# Patient Record
Sex: Male | Born: 1935
Health system: Southern US, Community
[De-identification: ages and names within clinical notes are randomized; demographics above are authoritative.]

## PROBLEM LIST (undated history)

## (undated) DIAGNOSIS — Z8701 Personal history of pneumonia (recurrent): Secondary | ICD-10-CM

## (undated) DIAGNOSIS — I313 Pericardial effusion (noninflammatory): Secondary | ICD-10-CM

## (undated) DIAGNOSIS — G5603 Carpal tunnel syndrome, bilateral upper limbs: Secondary | ICD-10-CM

## (undated) DIAGNOSIS — E785 Hyperlipidemia, unspecified: Secondary | ICD-10-CM

## (undated) DIAGNOSIS — I10 Essential (primary) hypertension: Secondary | ICD-10-CM

## (undated) DIAGNOSIS — M329 Systemic lupus erythematosus, unspecified: Secondary | ICD-10-CM

## (undated) DIAGNOSIS — K2270 Barrett's esophagus without dysplasia: Secondary | ICD-10-CM

## (undated) DIAGNOSIS — K219 Gastro-esophageal reflux disease without esophagitis: Secondary | ICD-10-CM

## (undated) DIAGNOSIS — I714 Abdominal aortic aneurysm, without rupture, unspecified: Secondary | ICD-10-CM

## (undated) DIAGNOSIS — N200 Calculus of kidney: Secondary | ICD-10-CM

## (undated) DIAGNOSIS — G4730 Sleep apnea, unspecified: Secondary | ICD-10-CM

## (undated) DIAGNOSIS — M1990 Unspecified osteoarthritis, unspecified site: Secondary | ICD-10-CM

## (undated) DIAGNOSIS — M797 Fibromyalgia: Secondary | ICD-10-CM

## (undated) DIAGNOSIS — I3139 Other pericardial effusion (noninflammatory): Secondary | ICD-10-CM

## (undated) DIAGNOSIS — D649 Anemia, unspecified: Secondary | ICD-10-CM

## (undated) HISTORY — DX: Gastro-esophageal reflux disease without esophagitis: K21.9

## (undated) HISTORY — DX: Abdominal aortic aneurysm, without rupture: I71.4

## (undated) HISTORY — PX: HAND SURGERY: SHX662

## (undated) HISTORY — PX: APPENDECTOMY: SHX54

## (undated) HISTORY — DX: Personal history of pneumonia (recurrent): Z87.01

## (undated) HISTORY — PX: TONSILLECTOMY: SUR1361

## (undated) HISTORY — PX: KNEE ARTHROPLASTY: SHX992

## (undated) HISTORY — PX: CYSTOSCOPY W/ STONE MANIPULATION: SHX1427

## (undated) HISTORY — DX: Hyperlipidemia, unspecified: E78.5

## (undated) HISTORY — PX: KNEE ARTHROSCOPY: SUR90

## (undated) HISTORY — DX: Pericardial effusion (noninflammatory): I31.3

## (undated) HISTORY — DX: Fibromyalgia: M79.7

## (undated) HISTORY — DX: Abdominal aortic aneurysm, without rupture, unspecified: I71.40

## (undated) HISTORY — DX: Other pericardial effusion (noninflammatory): I31.39

## (undated) HISTORY — DX: Calculus of kidney: N20.0

## (undated) HISTORY — DX: Essential (primary) hypertension: I10

---

## 1964-02-28 HISTORY — PX: HERNIA REPAIR: SHX51

## 1999-10-22 ENCOUNTER — Emergency Department (HOSPITAL_COMMUNITY): Admission: EM | Admit: 1999-10-22 | Discharge: 1999-10-22 | Payer: Self-pay

## 1999-10-24 ENCOUNTER — Ambulatory Visit (HOSPITAL_COMMUNITY): Admission: AD | Admit: 1999-10-24 | Discharge: 1999-10-24 | Payer: Self-pay | Admitting: Urology

## 1999-10-24 ENCOUNTER — Encounter: Payer: Self-pay | Admitting: Urology

## 2003-01-13 ENCOUNTER — Ambulatory Visit (HOSPITAL_COMMUNITY): Admission: RE | Admit: 2003-01-13 | Discharge: 2003-01-13 | Payer: Self-pay | Admitting: *Deleted

## 2003-01-13 ENCOUNTER — Encounter (INDEPENDENT_AMBULATORY_CARE_PROVIDER_SITE_OTHER): Payer: Self-pay

## 2008-01-29 ENCOUNTER — Ambulatory Visit (HOSPITAL_COMMUNITY): Admission: RE | Admit: 2008-01-29 | Discharge: 2008-01-29 | Payer: Self-pay | Admitting: Urology

## 2008-09-10 ENCOUNTER — Encounter: Admission: RE | Admit: 2008-09-10 | Discharge: 2008-09-10 | Payer: Self-pay | Admitting: Family Medicine

## 2008-09-11 ENCOUNTER — Ambulatory Visit: Payer: Self-pay | Admitting: Vascular Surgery

## 2009-12-07 ENCOUNTER — Ambulatory Visit: Payer: Self-pay | Admitting: Vascular Surgery

## 2010-03-20 ENCOUNTER — Encounter: Payer: Self-pay | Admitting: Family Medicine

## 2010-07-12 NOTE — Procedures (Signed)
VASCULAR LAB EXAM   INDICATION:  Rule out popliteal aneurysms   HISTORY:  Diabetes:  No.  Cardiac:  No.  Hypertension:  No.   EXAM:  Bilateral popliteal artery duplex.   IMPRESSION:  1. No evidence of aneurysmal dilatation of the bilateral popliteal      arteries with maximum diameters of 0.98 cm on the right and 1.0 cm      on the left.  2. Triphasic Doppler waveforms noted throughout the bilateral      popliteal arteries.        ___________________________________________  Larina Earthly, M.D.   CH/MEDQ  D:  12/08/2009  T:  12/08/2009  Job:  [id number]

## 2010-07-12 NOTE — Op Note (Signed)
Tyler Norris, Tyler Norris             ACCOUNT NO.:  [id number]   MEDICAL RECORD NO.:  [id number]          PATIENT TYPE:  AMB   LOCATION:  DAY                          FACILITY:  [facility]   PHYSICIAN:  Mark C. Vernie Ammons, M.D.  DATE OF BIRTH:  June 01, 1934   DATE OF PROCEDURE:  01/29/2008  DATE OF DISCHARGE:                               OPERATIVE REPORT   PREOPERATIVE DIAGNOSIS:  Left ureteral calculous.   POSTOPERATIVE DIAGNOSIS:  Left ureteral calculous.   PROCEDURES:  1. Cystoscopy.  2. Left ureteral catheterization.  3. Left retrograde pyelogram with interpretation.  4. Left ureteroscopy with laser lithotripsy.  5. Ureteroscopic stone extraction.  6. Left double J stent placement.   SURGEON:  Mark C. Vernie Ammons, M.D.   ASSISTANT:  Dr. Allena Katz.   ANESTHESIA:  General.   BLOOD LOSS:  None.   SPECIMENS:  Stone to the patient.   DRAINS:  6 French 24 double J stent in the left ureter (with string).   COMPLICATIONS:  None.   INDICATIONS FOR PROCEDURE:  The patient is a 76 year old white male with  intermittent left renal colic secondary to left ureteral calculous that  failed to progress with over time and conservative medical expulsive  therapy.  We have discussed the risks, complications and alternatives to  surgery.  He understands and has elected to proceed with the above noted  procedure.   DESCRIPTION OF PROCEDURE:  After informed consent the patient was  brought to the major OR and placed on the table and administered general  anesthesia then moved to the dorsal lithotomy position.  His genitalia  were sterilely prepped and draped.  An official time-out was then  performed.   Initially a 22 French cystoscope with 12 degree lens was advanced per  urethra under direct visualization and the urethra was noted to be  entirely normal down into the sphincter which appears intact.  The  prostatic urethra was slightly elongated with bilobar hypertrophy but no  prostatic lesions were  seen.  Upon entering the bladder the bladder was  then fully and systematically inspected and noted to be free of any  tumors, stones or inflammatory lesions.  The ureteral orifices were  normal configuration and position.  The left ureteral orifice was then  identified and cannulated with a 6 French open ended ureteral catheter.  Through this open ended ureteral catheter a left retrograde pyelogram  was then performed by injecting full strength contrast.   Left retrograde pyelogram revealed a normal appearing ureter with a  filling defect just over the lower border of the sacrum.  Proximal to  that the ureter appeared normal with no filling defects or other  abnormalities.   A 0.038 inch floppy tipped guidewire was then passed through the open  ended catheter and up the left ureter under direct fluoroscopic control  into the area of the renal pelvis and maintained as a safety guidewire.  Next the 6 French rigid ureteroscope was then passed under direct  visualization through the urethra and up the left ureter without  difficulty to where the stone was identified and photographed.  The  stone was too large to be extracted and therefore Holmium laser  lithotripsy was therefore performed.  Using a 365 micron Holmium laser  fiber the stone was fragmented easily into smaller pieces.  The Nitinol  basket was then used to extract these individually and at the end of the  procedure no significant stone fragments remained.  The ureteroscope was  therefore removed.   The cystoscope was back loaded over the guidewire and a double J stent  was then passed over the guidewire into the area of the renal pelvis  under direct fluoroscopic control.  As the guidewire was removed good  curl was noted in the renal pelvis and in the bladder.  The bladder was  then drained and the cystoscope removed.  The string, which was affixed  to the distal aspect of the stent, was then affixed to the dorsum of the   penis and the patient was awakened and taken to the recovery room in  stable satisfactory condition.  He tolerated the procedure well with no  intraoperative complications.  He will be given a prescription for  Pyridium 200 mg and follow up with me in the office in 5-7 days.      Mark C. Vernie Ammons, M.D.  Electronically Signed     MCO/MEDQ  D:  01/29/2008  T:  01/29/2008  Job:  [id number]

## 2010-07-12 NOTE — Consult Note (Signed)
NEW PATIENT CONSULTATION   Tyler Norris, Tyler Norris  DOB:  [birth year]                                       09/11/2008  NUUVO#:[id number]   The patient presents today for discussion regarding recent incidental  finding of a small infrarenal abdominal aortic aneurysm.  He is a very  active healthy 76 year old gentleman who reports over the course of the  last week to 10 days having a sensation of upper abdominal pain and  extending around to his back.  He reports that this initially seemed to  be somewhat related to musculoskeletal issues since if he would take a  deep breath he would have some discomfort.  He does have a history of  hiatal hernia and reflux.  He also has history of kidney stones but  reports this is not similar to the kidney stone pain that he had in the  past.  He underwent ultrasound and this revealed a 3.5 cm diameter  infrarenal aneurysm.   PAST MEDICAL HISTORY:  Negative for cardiac disease.  He does have  elevated cholesterol.   SOCIAL HISTORY:  He is married.  He is retired.  He does not smoke,  drink alcohol.   REVIEW OF SYSTEMS:  Weight is reported at 214 pounds.  He is 6 feet  tall.  He has no cardiac or pulmonary dysfunction.  He does have a  history of esophageal reflux and kidney stones.  No neurologic or  orthopedic issues.   ALLERGIES:  Lipitor causes myalgias.   CURRENT MEDICATIONS:  Nexium, TriCor, Crestor and multivitamins.   PHYSICAL EXAM:  A well-developed, well-nourished white male appearing  stated age of 79.  Blood pressure is 137/82, pulse 65, respirations 18,  radial pulses are 2+.  He has 2+ femoral, 2+ to 3+ popliteal and 2+  posterior tibial pulses.  He does have some prominence of pulsation in  his left popliteal space but no true aneurysm by physical exam.  Abdominal Exam:  Reveals obesity and a slight diastasis rectus.  I do  not feel the aneurysm, he does not have any tenderness.  His carotid  arteries are  without bruits bilaterally.  Heart:  Regular rate and  rhythm.  Chest:  Clear bilaterally.   I reviewed his ultrasound with the patient.  I explained that his 3.5 cm  infrarenal aneurysm is of no risk currently.  I explained that the  normal size would be 2 cm and we would not consider fixing this until it  got around 5 cm to 5.5 cm.  I explained that he does not need to do any  limitation regarding activity or other exercises or positioning.  I  explained that there would be expected slow growth of the aneurysm over  time and, therefore, we would follow him with serial ultrasounds and  plan to see him again in 1 year with repeat ultrasound at that time.  He  asked regarding FAA flying regulations and this aneurysm.  I explained  that he should be at essentially zero risk of rupture or other problems  with his very small aneurysm currently.  We will see him again in 1 year  with a repeat ultrasound.   Tyler Norris, M.D.  Electronically Signed   TFE/MEDQ  D:  09/11/2008  T:  09/14/2008  Job:  [id number]   cc:  Chales Salmon. Abigail Miyamoto, M.D.  Charles P. Harle Stanford, Dr.

## 2010-07-12 NOTE — Procedures (Signed)
DUPLEX ULTRASOUND OF ABDOMINAL AORTA   INDICATION:  Follow up abdominal aortic aneurysm, seen during abdominal  ultrasound done at outside facility.   HISTORY:  Diabetes:  No.  Cardiac:  No.  Hypertension:  No.  Smoking:  No.  Connective Tissue Disorder:  Family History:  No.  Previous Surgery:  No.   DUPLEX EXAM:         AP (cm)                   TRANSVERSE (cm)  Proximal             2.8 cm                    2.99 cm  Mid                  2.3 cm                    2.5 cm  Distal               2.0 cm                    2.0 cm  Right Iliac          1.4 cm                    1.4 cm  Left Iliac           1.3 cm                    1.4 cm   PREVIOUS:  Date:  AP:  TRANSVERSE:   IMPRESSION:  1. No obvious evidence of aneurysmal dilatation of the abdominal aorta      noted, based on limited visualization.  2. Decreased visualization of the abdominal aorta due to overlying      bowel gas patterns.   ___________________________________________  Larina Earthly, M.D.   CH/MEDQ  D:  12/08/2009  T:  12/08/2009  Job:  [id number]

## 2010-07-15 NOTE — Op Note (Signed)
[facility]  Patient:    Tyler Norris, Tyler Norris                    MRN: [id number] Proc. Date: 10/24/99 Adm. Date:  [id number] Attending:  Liborio Nixon                           Operative Report  PREOPERATIVE DIAGNOSIS:  Right distal ureteral calculus with significant pain.  POSTOPERATIVE DIAGNOSIS:  Right distal ureteral calculus with significant pain.  PRINCIPAL PROCEDURE:  Ureteroscopic stone extraction, double-J stent placement.  SURGEON:  Bertram Millard. Dahlstedt, M.D.  ANESTHESIA:  General endotracheal.  COMPLICATIONS:  None.  BRIEF HISTORY:  76 year-old male with persistent pain over the past week or so, due to a large right distal ureteral calculus.  He was seen in the office this morning by Dr. Wanda Plump.  The patient, due to the fact that he has had persistent pain for over a week, has asked to have surgical management of this stone.  He is aware of risks and complications of this procedure and desired to proceed.  DESCRIPTION OF PROCEDURE:  The patient was taken to the operating suite where general endotracheal anesthetic was administered.  He was placed in the dorsal lithotomy position.  Genitalia and perineum were prepped and draped.  A 6 [place] Micro-ureterscope was advanced under direct vision up into his bladder. The right ureteral orifice was identified and was somewhat erythematous.  I was not able to cannulate the orifice alone with just the scope, so a 0.035 inch Glidewire was placed through the ureteroscope.  This allowed access into the right distal ureter where a large stone was seen.  There was significant inflammatory change around the stone.  The stone was pushed a bit proximally in the ureter.  A 3 French basket was then placed through the scope, and the stone was grasped.  It was quite difficult to negotiate through the right ureteral orifice.  Because of this, I dilated the distal ureteral orifice with a  4 cm balloon at 15 [place].  Fifteen atmospheres of pressure was used.  Once the balloon dilator was removed, the stone was easily passed through the ureteral orifice.  The scope was then again advanced into the proximal ureter. No further stones were seen.  The scope was then removed and a cystoscope placed over top of the guidewire.  The tip of the guidewire was found to be in the right renal pelvis.  Over top of the guidewire, a 6 French x 26 cm Lubri-Flex double-J stent was placed, the string left on the end.  Adequate positioning was seen.  The guidewire was removed.  Proximal and distal curls were quite nice on the stent.  The string was then brought through the urethra.  The bladder was drained, the scope removed, and the procedure terminated.  The patient tolerated the procedure well and was returned to the PACU in stable condition. DD:  10/24/99 TD:  10/24/99 Job: [id number]

## 2010-12-01 LAB — BASIC METABOLIC PANEL
BUN: 18 mg/dL (ref 6–23)
CO2: 27 mEq/L (ref 19–32)
Calcium: 9.4 mg/dL (ref 8.4–10.5)
Chloride: 106 mEq/L (ref 96–112)
Creatinine, Ser: 1.03 mg/dL (ref 0.4–1.5)
GFR calc Af Amer: >60 mL/min (ref >60)
GFR calc non Af Amer: >60 mL/min (ref >60)
Glucose, Bld: 116 mg/dL — ABNORMAL HIGH (ref 70–99)
Potassium: 3.8 mEq/L (ref 3.5–5.1)
Sodium: 142 mEq/L (ref 135–145)

## 2010-12-01 LAB — HEMOGLOBIN AND HEMATOCRIT, BLOOD
HCT: 39 % (ref 39.0–52.0)
Hemoglobin: 13.5 g/dL (ref 13.0–17.0)

## 2011-04-11 DIAGNOSIS — E782 Mixed hyperlipidemia: Secondary | ICD-10-CM | POA: Diagnosis not present

## 2011-04-11 DIAGNOSIS — Z79899 Other long term (current) drug therapy: Secondary | ICD-10-CM | POA: Diagnosis not present

## 2011-04-11 DIAGNOSIS — Z23 Encounter for immunization: Secondary | ICD-10-CM | POA: Diagnosis not present

## 2011-04-11 DIAGNOSIS — R7301 Impaired fasting glucose: Secondary | ICD-10-CM | POA: Diagnosis not present

## 2011-04-21 DIAGNOSIS — K219 Gastro-esophageal reflux disease without esophagitis: Secondary | ICD-10-CM | POA: Diagnosis not present

## 2011-04-21 DIAGNOSIS — E669 Obesity, unspecified: Secondary | ICD-10-CM | POA: Diagnosis not present

## 2011-04-21 DIAGNOSIS — E785 Hyperlipidemia, unspecified: Secondary | ICD-10-CM | POA: Diagnosis not present

## 2011-04-21 DIAGNOSIS — R7301 Impaired fasting glucose: Secondary | ICD-10-CM | POA: Diagnosis not present

## 2011-06-13 ENCOUNTER — Encounter: Payer: Self-pay | Admitting: Vascular Surgery

## 2011-06-21 ENCOUNTER — Encounter: Payer: Self-pay | Admitting: Vascular Surgery

## 2011-06-26 ENCOUNTER — Encounter: Payer: Self-pay | Admitting: Vascular Surgery

## 2011-06-27 ENCOUNTER — Ambulatory Visit (INDEPENDENT_AMBULATORY_CARE_PROVIDER_SITE_OTHER): Payer: Medicare Other | Admitting: *Deleted

## 2011-06-27 ENCOUNTER — Encounter: Payer: Self-pay | Admitting: Vascular Surgery

## 2011-06-27 ENCOUNTER — Ambulatory Visit (INDEPENDENT_AMBULATORY_CARE_PROVIDER_SITE_OTHER): Payer: Medicare Other | Admitting: Vascular Surgery

## 2011-06-27 VITALS — BP 172/95 | HR 60 | Resp 20 | Ht 70.0 in | Wt 222.0 lb

## 2011-06-27 DIAGNOSIS — I714 Abdominal aortic aneurysm, without rupture, unspecified: Secondary | ICD-10-CM

## 2011-06-27 NOTE — Progress Notes (Signed)
The patient presents today for followup of his small) abdominal aortic aneurysm. He has no symptoms referable to this. He does remain quite active. 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 He has no symptoms referable to his aneurysm. He has no cardiac difficulties.  Past Medical History  Diagnosis Date  . AAA (abdominal aortic aneurysm)   . Kidney stones   . Hyperlipidemia   . GERD (gastroesophageal reflux disease)     History  Substance Use Topics  . Smoking status: Never Smoker   . Smokeless tobacco: Never Used  . Alcohol Use: No    Family History  Problem Relation Age of Onset  . Heart disease Father   . Hyperlipidemia Father   . Hypertension Father   . Other Father     VARICOSE VEIN  . Peripheral vascular disease Father     Allergies  Allergen Reactions  . Lipitor (Atorvastatin Calcium)     Myalgias    Current outpatient prescriptions:esomeprazole (NEXIUM) 40 MG capsule, Take 40 mg by mouth daily before breakfast., Disp: , Rfl: ;  fenofibrate (TRICOR) 145 MG tablet, Take 145 mg by mouth daily., Disp: , Rfl: ;  Multiple Vitamin (MULTIVITAMIN) tablet, Take 1 tablet by mouth daily., Disp: , Rfl: ;  rosuvastatin (CRESTOR) 5 MG tablet, Take 5 mg by mouth daily., Disp: , Rfl:   BP 172/95  Pulse 60  Resp 20  Ht 5\' 10"  (1.778 m)  Wt 222 lb (100.699 kg)  BMI 31.85 kg/m2  Body mass index is 31.85 kg/(m^2).       Physical exam well-developed well-nourished white male no acute distress. He has 2+ radial 2+ femoral 2+ popliteal 2+ dorsalis pedis pulses with no evidence of peripheral aneurysms. Abdominal exam reveals moderate obesity I do not feel an aneurysm.  Vascular lab: Maximal size of his aneurysm today is 2.15 cm.  Impression and plan: Stripping done aortic aneurysm with study from an outside facility showing a 3.5 cm  aneurysm in the study in our lab showing a 3 cm aneurysm in 2011. I did explain this is some variation over time. I feel that he is certainly having no evidence of increased size of his aneurysm and would recommend that we see him again in 2 years with repeat ultrasound. If he does have finding at that time of who minimal enlargement we would recommend discontinuing followup. Discuss aneurysms explained that this essentially 0 risk of this with his current size

## 2011-06-27 NOTE — Progress Notes (Signed)
Addended by: Sharee Pimple on: 06/27/2011 01:34 PM   Modules accepted: Orders

## 2011-06-29 NOTE — Procedures (Unsigned)
DUPLEX ULTRASOUND OF ABDOMINAL AORTA  INDICATION:  Follow up abdominal aortic aneurysm from outside facility.  HISTORY: Outside facility revealed a 3.5-cm diameter infrarenal aneurysm by ultrasound. Fibromyalgia. Diabetes:  No. Cardiac:  No. Hypertension:  No. Smoking:  No. Connective Tissue Disorder: Family History:  No. Previous Surgery:  No.  DUPLEX EXAM:         AP (cm)                   TRANSVERSE (cm) Proximal             2.5 cm                    2.10 cm Mid                  2.01 cm                   2.15 cm Distal               1.79 cm                   1.86 cm Right Iliac          1.19 cm                   1.19 cm Left Iliac           1.11 cm                   1.12 cm  PREVIOUS:  Date: 12/07/2009 ([facility])  AP:  2.8 (proximal)  TRANSVERSE: 2.99 (largest measurement)  IMPRESSION: 1. No obvious evidence of aneurysmal dilatation. 2. This was somewhat of a technically difficult exam due to body     habitus and overlying bowel gas.  ___________________________________________ Larina Earthly, M.D.  SS/MEDQ  D:  06/27/2011  T:  06/27/2011  Job:  [id number]

## 2011-08-21 ENCOUNTER — Ambulatory Visit
Admission: RE | Admit: 2011-08-21 | Discharge: 2011-08-21 | Disposition: A | Discharge status: Home / Self Care | Payer: Medicare Other | Source: Ambulatory Visit | Attending: Family Medicine | Admitting: Family Medicine

## 2011-08-21 ENCOUNTER — Other Ambulatory Visit: Payer: Self-pay | Admitting: Family Medicine

## 2011-08-21 DIAGNOSIS — M79609 Pain in unspecified limb: Secondary | ICD-10-CM | POA: Diagnosis not present

## 2011-08-21 DIAGNOSIS — M5030 Other cervical disc degeneration, unspecified cervical region: Secondary | ICD-10-CM | POA: Diagnosis not present

## 2011-08-21 DIAGNOSIS — M47817 Spondylosis without myelopathy or radiculopathy, lumbosacral region: Secondary | ICD-10-CM | POA: Diagnosis not present

## 2011-08-21 DIAGNOSIS — R52 Pain, unspecified: Secondary | ICD-10-CM

## 2011-08-21 DIAGNOSIS — M47812 Spondylosis without myelopathy or radiculopathy, cervical region: Secondary | ICD-10-CM | POA: Diagnosis not present

## 2011-08-21 DIAGNOSIS — G5710 Meralgia paresthetica, unspecified lower limb: Secondary | ICD-10-CM | POA: Diagnosis not present

## 2011-08-21 DIAGNOSIS — G5600 Carpal tunnel syndrome, unspecified upper limb: Secondary | ICD-10-CM | POA: Diagnosis not present

## 2011-08-24 ENCOUNTER — Other Ambulatory Visit: Payer: Self-pay | Admitting: Family Medicine

## 2011-08-24 DIAGNOSIS — M5030 Other cervical disc degeneration, unspecified cervical region: Secondary | ICD-10-CM

## 2011-08-24 DIAGNOSIS — M5412 Radiculopathy, cervical region: Secondary | ICD-10-CM

## 2011-08-30 ENCOUNTER — Ambulatory Visit
Admission: RE | Admit: 2011-08-30 | Discharge: 2011-08-30 | Disposition: A | Discharge status: Home / Self Care | Payer: Medicare Other | Source: Ambulatory Visit | Attending: Family Medicine | Admitting: Family Medicine

## 2011-08-30 DIAGNOSIS — M5020 Other cervical disc displacement, unspecified cervical region: Secondary | ICD-10-CM | POA: Diagnosis not present

## 2011-08-30 DIAGNOSIS — M5412 Radiculopathy, cervical region: Secondary | ICD-10-CM

## 2011-08-30 DIAGNOSIS — M5030 Other cervical disc degeneration, unspecified cervical region: Secondary | ICD-10-CM | POA: Diagnosis not present

## 2011-08-30 DIAGNOSIS — M47812 Spondylosis without myelopathy or radiculopathy, cervical region: Secondary | ICD-10-CM | POA: Diagnosis not present

## 2011-09-01 DIAGNOSIS — G5600 Carpal tunnel syndrome, unspecified upper limb: Secondary | ICD-10-CM | POA: Diagnosis not present

## 2011-09-01 DIAGNOSIS — M5412 Radiculopathy, cervical region: Secondary | ICD-10-CM | POA: Diagnosis not present

## 2011-09-06 DIAGNOSIS — M5412 Radiculopathy, cervical region: Secondary | ICD-10-CM | POA: Diagnosis not present

## 2011-09-11 DIAGNOSIS — G5600 Carpal tunnel syndrome, unspecified upper limb: Secondary | ICD-10-CM | POA: Diagnosis not present

## 2011-09-18 DIAGNOSIS — R1032 Left lower quadrant pain: Secondary | ICD-10-CM | POA: Diagnosis not present

## 2011-09-18 DIAGNOSIS — R350 Frequency of micturition: Secondary | ICD-10-CM | POA: Diagnosis not present

## 2011-09-18 DIAGNOSIS — K5900 Constipation, unspecified: Secondary | ICD-10-CM | POA: Diagnosis not present

## 2011-09-29 DIAGNOSIS — G5600 Carpal tunnel syndrome, unspecified upper limb: Secondary | ICD-10-CM | POA: Diagnosis not present

## 2011-09-29 DIAGNOSIS — M5412 Radiculopathy, cervical region: Secondary | ICD-10-CM | POA: Diagnosis not present

## 2011-09-29 DIAGNOSIS — R109 Unspecified abdominal pain: Secondary | ICD-10-CM | POA: Diagnosis not present

## 2011-10-03 DIAGNOSIS — M5030 Other cervical disc degeneration, unspecified cervical region: Secondary | ICD-10-CM | POA: Diagnosis not present

## 2011-10-03 DIAGNOSIS — M5412 Radiculopathy, cervical region: Secondary | ICD-10-CM | POA: Diagnosis not present

## 2011-10-03 DIAGNOSIS — G5600 Carpal tunnel syndrome, unspecified upper limb: Secondary | ICD-10-CM | POA: Diagnosis not present

## 2011-10-03 DIAGNOSIS — M542 Cervicalgia: Secondary | ICD-10-CM | POA: Diagnosis not present

## 2011-10-03 DIAGNOSIS — M47812 Spondylosis without myelopathy or radiculopathy, cervical region: Secondary | ICD-10-CM | POA: Diagnosis not present

## 2011-10-04 DIAGNOSIS — D649 Anemia, unspecified: Secondary | ICD-10-CM | POA: Diagnosis not present

## 2011-10-04 DIAGNOSIS — K5732 Diverticulitis of large intestine without perforation or abscess without bleeding: Secondary | ICD-10-CM | POA: Diagnosis not present

## 2011-10-04 DIAGNOSIS — R5383 Other fatigue: Secondary | ICD-10-CM | POA: Diagnosis not present

## 2011-10-04 DIAGNOSIS — M5412 Radiculopathy, cervical region: Secondary | ICD-10-CM | POA: Diagnosis not present

## 2011-10-04 DIAGNOSIS — R5381 Other malaise: Secondary | ICD-10-CM | POA: Diagnosis not present

## 2011-10-04 DIAGNOSIS — G5600 Carpal tunnel syndrome, unspecified upper limb: Secondary | ICD-10-CM | POA: Diagnosis not present

## 2011-10-09 ENCOUNTER — Encounter (HOSPITAL_BASED_OUTPATIENT_CLINIC_OR_DEPARTMENT_OTHER): Payer: Self-pay | Admitting: *Deleted

## 2011-10-09 DIAGNOSIS — R7301 Impaired fasting glucose: Secondary | ICD-10-CM | POA: Diagnosis not present

## 2011-10-09 NOTE — Progress Notes (Signed)
No cardiac or resp problems-active

## 2011-10-10 ENCOUNTER — Other Ambulatory Visit: Payer: Self-pay | Admitting: Orthopedic Surgery

## 2011-10-11 NOTE — H&P (Signed)
Tyler Norris is an 77 y.o. male.   Chief Complaint: c/o chronic and progressive numbness and tingling of the left hand HPI: .  Mr. 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  He presented for evaluation of bilateral hand dysesthesias and a right lateral thigh dysesthesia. Dr. Abigail Miyamoto identified evidence of meralgia paresthetica, possible carpal tunnel syndrome and worked him up with cervical films including a cervical MRI.  The cervical MRI documented significant multilevel cervical degenerative disc disease with anterior listhesis of C4 and C5 of 2 millimeters; significant foraminal narrowing at C4-5, left greater than right; significant foraminal stenosis bilaterally at C5-6 and mild foraminal narrowing at C6-7.  He has no abnormal cord signal.  He is now referred for an upper extremity orthopaedic consult.     Past Medical History  Diagnosis Date  . AAA (abdominal aortic aneurysm)   . Kidney stones   . Hyperlipidemia   . GERD (gastroesophageal reflux disease)   . Carpal tunnel syndrome, bilateral     Past Surgical History  Procedure Date  . Appendectomy   . Hernia repair   . Hand surgery     scar repaired lt hand  . Tonsillectomy   . Cystoscopy w/ stone manipulation 2001,2009    Family History  Problem Relation Age of Onset  . Heart disease Father   . Hyperlipidemia Father   . Hypertension Father   . Other Father     VARICOSE VEIN  . Peripheral vascular disease Father    Social History:  reports that he has never smoked. He has never used smokeless tobacco. He reports that he does not drink alcohol or use illicit drugs.  Allergies:  Allergies  Allergen Reactions  . Lipitor (Atorvastatin Calcium)     Myalgias    No prescriptions prior to admission    No results found for this or any previous visit  (from the past 48 hour(s)).  No results found.   Pertinent items are noted in HPI.  Height 5\' 10"  (1.778 m), weight 90.719 kg (200 lb).  General appearance: alert Head: Normocephalic, without obvious abnormality Neck: supple, symmetrical, trachea midline Resp: clear to auscultation bilaterally Cardio: regular rate and rhythm GI: normal findings: bowel sounds normal Extremities:  Inspection of his hands reveals diminished sweat patterns in the median distribution bilaterally.  He does not have frank thenar atrophy.  He does have findings compatible with degenerative arthritis of his hands including Heberden's and Bouchard's nodes.  He has stiffness of his cervical spine with restricted range of motion, forward flexion/extension, rotation right and left.  I cannot demonstrate any radicular signs or symptoms. Confrontational testing revealed 5/5 strength of pinch, grip, wrist extension, wrist flexion, elbow flexion/extension, shoulder abduction, flexion, shoulder internal/external rotation.  He had hypoactive reflexes at the biceps, triceps, brachioradialis, but symmetrical bilaterally and hypoactive knee jerks. He did not show signs of myelopathy.     Dr.Sam Pelligra completed detailed electrodiagnostic studies.  These revealed evidence of severe bilateral carpal tunnel syndrome with marked decrease in the sensory and motor amplitudes.   Pulses: 2+ and symmetric Skin: normal Neurologic: Grossly normal    Assessment/Plan Impression:Bilateral CTS  Plan: Ton the OR for left CTR.The procedure, risks,benefits and post-op course were discussed with the patient at length and they were in agreement with the plan.   DASNOIT,Desare Duddy J  10/11/2011, 8:47 PM     H&P documentation: 10/12/2011  -History and Physical Reviewed  -Patient has been re-examined  -No change in the plan of care  Wyn Forster, MD

## 2011-10-12 ENCOUNTER — Encounter (HOSPITAL_BASED_OUTPATIENT_CLINIC_OR_DEPARTMENT_OTHER): Payer: Self-pay | Admitting: Anesthesiology

## 2011-10-12 ENCOUNTER — Ambulatory Visit (HOSPITAL_BASED_OUTPATIENT_CLINIC_OR_DEPARTMENT_OTHER)
Admission: RE | Admit: 2011-10-12 | Discharge: 2011-10-12 | Disposition: A | Discharge status: Home / Self Care | Payer: Medicare Other | Source: Ambulatory Visit | Attending: Orthopedic Surgery | Admitting: Orthopedic Surgery

## 2011-10-12 ENCOUNTER — Encounter (HOSPITAL_BASED_OUTPATIENT_CLINIC_OR_DEPARTMENT_OTHER): Payer: Self-pay | Admitting: Certified Registered Nurse Anesthetist

## 2011-10-12 ENCOUNTER — Ambulatory Visit (HOSPITAL_BASED_OUTPATIENT_CLINIC_OR_DEPARTMENT_OTHER): Payer: Medicare Other | Admitting: Anesthesiology

## 2011-10-12 ENCOUNTER — Encounter (HOSPITAL_BASED_OUTPATIENT_CLINIC_OR_DEPARTMENT_OTHER)
Admission: RE | Disposition: A | Discharge status: Home / Self Care | Payer: Self-pay | Source: Ambulatory Visit | Attending: Orthopedic Surgery

## 2011-10-12 ENCOUNTER — Encounter (HOSPITAL_BASED_OUTPATIENT_CLINIC_OR_DEPARTMENT_OTHER): Payer: Self-pay | Admitting: *Deleted

## 2011-10-12 DIAGNOSIS — G5600 Carpal tunnel syndrome, unspecified upper limb: Secondary | ICD-10-CM | POA: Diagnosis not present

## 2011-10-12 DIAGNOSIS — K219 Gastro-esophageal reflux disease without esophagitis: Secondary | ICD-10-CM | POA: Insufficient documentation

## 2011-10-12 HISTORY — DX: Carpal tunnel syndrome, bilateral upper limbs: G56.03

## 2011-10-12 HISTORY — PX: CARPAL TUNNEL RELEASE: SHX101

## 2011-10-12 LAB — POCT HEMOGLOBIN-HEMACUE: Hemoglobin: 11.4 g/dL — ABNORMAL LOW (ref 13.0–17.0)

## 2011-10-12 SURGERY — CARPAL TUNNEL RELEASE
Anesthesia: General | Site: Wrist | Laterality: Left | Wound class: Clean

## 2011-10-12 MED ORDER — METOCLOPRAMIDE HCL 5 MG/ML IJ SOLN
INTRAMUSCULAR | Status: DC | PRN
  Administered 2011-10-12: 10 mg via INTRAVENOUS

## 2011-10-12 MED ORDER — LIDOCAINE HCL (CARDIAC) 20 MG/ML IV SOLN
INTRAVENOUS | Status: DC | PRN
  Administered 2011-10-12: 60 mg via INTRAVENOUS

## 2011-10-12 MED ORDER — OXYCODONE-ACETAMINOPHEN 5-325 MG PO TABS: 1.0000 | ORAL_TABLET | ORAL | Status: AC | PRN

## 2011-10-12 MED ORDER — PROPOFOL 10 MG/ML IV EMUL
INTRAVENOUS | Status: DC | PRN
  Administered 2011-10-12: 200 mg via INTRAVENOUS

## 2011-10-12 MED ORDER — ONDANSETRON HCL 4 MG/2ML IJ SOLN
INTRAMUSCULAR | Status: DC | PRN
  Administered 2011-10-12: 4 mg via INTRAVENOUS

## 2011-10-12 MED ORDER — FENTANYL CITRATE 0.05 MG/ML IJ SOLN
INTRAMUSCULAR | Status: DC | PRN
  Administered 2011-10-12: 50 ug via INTRAVENOUS

## 2011-10-12 MED ORDER — CHLORHEXIDINE GLUCONATE 4 % EX LIQD
60.0000 mL | Freq: Once | CUTANEOUS | Status: AC
  Administered 2011-10-12: 4 via TOPICAL

## 2011-10-12 MED ORDER — LIDOCAINE HCL 2 % IJ SOLN
INTRAMUSCULAR | Status: DC | PRN
  Administered 2011-10-12: 3 mL

## 2011-10-12 MED ORDER — DEXAMETHASONE SODIUM PHOSPHATE 10 MG/ML IJ SOLN
INTRAMUSCULAR | Status: DC | PRN
  Administered 2011-10-12: 10 mg via INTRAVENOUS

## 2011-10-12 MED ORDER — LACTATED RINGERS IV SOLN
INTRAVENOUS | Status: DC
  Administered 2011-10-12 (×2): via INTRAVENOUS

## 2011-10-12 SURGICAL SUPPLY — 40 items
BANDAGE ADHESIVE 1X3 (GAUZE/BANDAGES/DRESSINGS) IMPLANT
BANDAGE ELASTIC 3 VELCRO ST LF (GAUZE/BANDAGES/DRESSINGS) ×2 IMPLANT
BLADE SURG 15 STRL LF DISP TIS (BLADE) ×1 IMPLANT
BLADE SURG 15 STRL SS (BLADE) ×1
BNDG ESMARK 4X9 LF (GAUZE/BANDAGES/DRESSINGS) IMPLANT
BRUSH SCRUB EZ PLAIN DRY (MISCELLANEOUS) ×2 IMPLANT
CLOTH BEACON ORANGE TIMEOUT ST (SAFETY) ×2 IMPLANT
CORDS BIPOLAR (ELECTRODE) ×2 IMPLANT
COVER MAYO STAND STRL (DRAPES) ×2 IMPLANT
COVER TABLE BACK 60X90 (DRAPES) ×2 IMPLANT
CUFF TOURNIQUET SINGLE 18IN (TOURNIQUET CUFF) ×2 IMPLANT
DECANTER SPIKE VIAL GLASS SM (MISCELLANEOUS) IMPLANT
DRAPE EXTREMITY T 121X128X90 (DRAPE) ×2 IMPLANT
DRAPE SURG 17X23 STRL (DRAPES) ×2 IMPLANT
GLOVE BIO SURGEON STRL SZ 6.5 (GLOVE) ×2 IMPLANT
GLOVE BIOGEL M STRL SZ7.5 (GLOVE) ×2 IMPLANT
GLOVE EXAM NITRILE PF MED BLUE (GLOVE) ×2 IMPLANT
GLOVE ORTHO TXT STRL SZ7.5 (GLOVE) ×2 IMPLANT
GOWN BRE IMP PREV XXLGXLNG (GOWN DISPOSABLE) ×4 IMPLANT
GOWN PREVENTION PLUS XLARGE (GOWN DISPOSABLE) ×2 IMPLANT
GOWN PREVENTION PLUS XXLARGE (GOWN DISPOSABLE) IMPLANT
NDL SAFETY ECLIPSE 18X1.5 (NEEDLE) ×1 IMPLANT
NEEDLE 27GAX1X1/2 (NEEDLE) ×2 IMPLANT
NEEDLE HYPO 18GX1.5 SHARP (NEEDLE) ×1
PACK BASIN DAY SURGERY FS (CUSTOM PROCEDURE TRAY) ×2 IMPLANT
PAD CAST 3X4 CTTN HI CHSV (CAST SUPPLIES) ×1 IMPLANT
PADDING CAST ABS 4INX4YD NS (CAST SUPPLIES)
PADDING CAST ABS COTTON 4X4 ST (CAST SUPPLIES) IMPLANT
PADDING CAST COTTON 3X4 STRL (CAST SUPPLIES) ×1
SPLINT PLASTER CAST XFAST 3X15 (CAST SUPPLIES) ×5 IMPLANT
SPLINT PLASTER XTRA FASTSET 3X (CAST SUPPLIES) ×5
SPONGE GAUZE 4X4 12PLY (GAUZE/BANDAGES/DRESSINGS) ×2 IMPLANT
STOCKINETTE 4X48 STRL (DRAPES) ×2 IMPLANT
STRIP CLOSURE SKIN 1/2X4 (GAUZE/BANDAGES/DRESSINGS) ×2 IMPLANT
SUT PROLENE 3 0 PS 2 (SUTURE) ×2 IMPLANT
SYR 3ML 23GX1 SAFETY (SYRINGE) IMPLANT
SYR CONTROL 10ML LL (SYRINGE) ×2 IMPLANT
TRAY DSU PREP LF (CUSTOM PROCEDURE TRAY) ×2 IMPLANT
UNDERPAD 30X30 INCONTINENT (UNDERPADS AND DIAPERS) ×2 IMPLANT
WATER STERILE IRR 1000ML POUR (IV SOLUTION) ×2 IMPLANT

## 2011-10-12 NOTE — Anesthesia Preprocedure Evaluation (Signed)
Anesthesia Evaluation  Patient identified by MRN, date of birth, ID band Patient awake    Reviewed: Allergy & Precautions, H&P , NPO status , Patient's Chart, lab work & pertinent test results, reviewed documented beta blocker date and time   Airway Mallampati: II TM Distance: >3 FB Neck ROM: full    Dental   Pulmonary neg pulmonary ROS,  breath sounds clear to auscultation        Cardiovascular + Peripheral Vascular Disease Rhythm:regular     Neuro/Psych  Neuromuscular disease negative psych ROS   GI/Hepatic Neg liver ROS, GERD-  Medicated and Controlled,  Endo/Other  negative endocrine ROS  Renal/GU Renal disease  negative genitourinary   Musculoskeletal   Abdominal   Peds  Hematology negative hematology ROS (+)   Anesthesia Other Findings See surgeon's H&P   Reproductive/Obstetrics negative OB ROS                           Anesthesia Physical Anesthesia Plan  ASA: III  Anesthesia Plan: General   Post-op Pain Management:    Induction: Intravenous  Airway Management Planned: LMA  Additional Equipment:   Intra-op Plan:   Post-operative Plan: Extubation in OR  Informed Consent: I have reviewed the patients History and Physical, chart, labs and discussed the procedure including the risks, benefits and alternatives for the proposed anesthesia with the patient or authorized representative who has indicated his/her understanding and acceptance.   Dental Advisory Given  Plan Discussed with: CRNA and Surgeon  Anesthesia Plan Comments:         Anesthesia Quick Evaluation

## 2011-10-12 NOTE — Transfer of Care (Signed)
Immediate Anesthesia Transfer of Care Note  Patient: Tyler Norris  Procedure(s) Performed: Procedure(s) (LRB): CARPAL TUNNEL RELEASE (Left)  Patient Location: PACU  Anesthesia Type: General  Level of Consciousness: awake, alert , oriented and patient cooperative  Airway & Oxygen Therapy: Patient Spontanous Breathing and Patient connected to face mask oxygen  Post-op Assessment: Report given to PACU RN and Post -op Vital signs reviewed and stable  Post vital signs: Reviewed and stable  Complications: No apparent anesthesia complications

## 2011-10-12 NOTE — Anesthesia Procedure Notes (Signed)
Procedure Name: LMA Insertion Date/Time: 10/12/2011 7:52 AM Performed by: Beckhem Isadore D Pre-anesthesia Checklist: Patient identified, Emergency Drugs available, Suction available and Patient being monitored Patient Re-evaluated:Patient Re-evaluated prior to inductionOxygen Delivery Method: Circle System Utilized Preoxygenation: Pre-oxygenation with 100% oxygen Intubation Type: IV induction Ventilation: Mask ventilation without difficulty LMA: LMA inserted LMA Size: 5.0 Number of attempts: 1 Placement Confirmation: positive ETCO2 Tube secured with: Tape Dental Injury: Teeth and Oropharynx as per pre-operative assessment

## 2011-10-12 NOTE — Brief Op Note (Signed)
10/12/2011  8:09 AM  PATIENT:  Lauree Chandler  77 y.o. male  PRE-OPERATIVE DIAGNOSIS:  bilateral carpal tunnel syndrome  POST-OPERATIVE DIAGNOSIS:  bilateral carpal tunnel syndrome  PROCEDURE:  Procedure(s) (LRB): CARPAL TUNNEL RELEASE (Left)  SURGEON:  Surgeon(s) and Role:    * Wyn Forster., MD - Primary  PHYSICIAN ASSISTANT:   ASSISTANTS:Obbie Lewallen Dasnoit,P.A-C   ANESTHESIA:   none  EBL:     BLOOD ADMINISTERED:none  DRAINS: none   LOCAL MEDICATIONS USED:  XYLOCAINE   SPECIMEN:  No Specimen  DISPOSITION OF SPECIMEN:  N/A  COUNTS:  YES  TOURNIQUET:  * Missing tourniquet times found for documented tourniquets in log:  [id number] *  DICTATION: .Other Dictation: Dictation Number [id number]  PLAN OF CARE: Discharge to home after PACU  PATIENT DISPOSITION:  PACU - hemodynamically stable.

## 2011-10-12 NOTE — Anesthesia Postprocedure Evaluation (Signed)
Anesthesia Post Note  Patient: Tyler Norris  Procedure(s) Performed: Procedure(s) (LRB): CARPAL TUNNEL RELEASE (Left)  Anesthesia type: General  Patient location: PACU  Post pain: Pain level controlled  Post assessment: Patient's Cardiovascular Status Stable  Last Vitals:  Filed Vitals:   10/12/11 0930  BP: 128/73  Pulse: 81  Temp: 36.6 C  Resp: 20    Post vital signs: Reviewed and stable  Level of consciousness: alert  Complications: No apparent anesthesia complications

## 2011-10-13 NOTE — Op Note (Signed)
NAME:  MARQUE, RADEMAKER NO.:  [id number]  MEDICAL RECORD NO.:  [id number]  LOCATION:                                 FACILITY:  PHYSICIAN:  Katy Fitch. Adelene Polivka, M.D.      DATE OF BIRTH:  DATE OF PROCEDURE:  10/12/2011 DATE OF DISCHARGE:                              OPERATIVE REPORT   PREOPERATIVE DIAGNOSIS:  Entrapment neuropathy, median nerve, left carpal tunnel.  POSTOPERATIVE DIAGNOSIS:  Entrapment neuropathy, median nerve, left carpal tunnel.  OPERATION:  Release of left transverse carpal ligament.  OPERATING SURGEON:  Katy Fitch. Hawley Michel, MD  ASSISTANT:  Marveen Reeks Dasnoit, PA-C  ANESTHESIA:  General by LMA.  SUPERVISING ANESTHESIOLOGIST:  Janetta Hora. 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  Clinical examination revealed signs of probable carpal tunnel syndrome.  Electrodiagnostic studies confirmed significant median neuropathy.  Due to a failure to respond to nonoperative measures, he is brought to the operating room at this time for release of his left transverse carpal ligament.  Preoperatively, he was interviewed by Dr. Gelene Mink.  Dr. Gelene Mink recommended general anesthesia by LMA technique.  This was accepted by [name].  PROCEDURE:  [name] was brought to Room #2 of the [facility] and placed in supine position on the operating table.  Following the induction of general anesthesia by LMA technique, the left hand and arm were prepped with Betadine soap and solution, sterilely draped.  A pneumatic tourniquet was applied to the proximal left brachium.  Following exsanguination of left arm with an Esmarch bandage, the arterial tourniquet was inflated to 220 mmHg.  Procedure commenced with a short incision in the line of the ring finger of the palm. Subcutaneous tissues were  carefully divided revealing the palmar fascia. This was split longitudinally to reveal the common sensory branch of the median nerve.  These were followed back to the transverse carpal ligament, which was gently isolated from median nerve.  The ligament was then released along its ulnar border extending into the distal forearm. This widely opened the carpal canal.  Bleeding points along the margin of the released ligament and soft tissues were electrocauterized with bipolar current followed by repair of the skin with intradermal 3-0 Prolene suture.  A compressive dressing was applied with a volar plaster splint maintaining the wrist in 10 degrees of dorsiflexion.  For aftercare, [name] is provided a prescription for Percocet 5 mg 1 p.o. q.4-6 hours p.r.n. pain, 20 tablets without refill.     Katy Fitch Harrold Fitchett, M.D.     RVS/MEDQ  D:  10/12/2011  T:  10/12/2011  Job:  [id number]

## 2011-10-16 ENCOUNTER — Encounter (HOSPITAL_BASED_OUTPATIENT_CLINIC_OR_DEPARTMENT_OTHER): Payer: Self-pay | Admitting: Orthopedic Surgery

## 2011-10-18 ENCOUNTER — Other Ambulatory Visit: Payer: Self-pay | Admitting: Family Medicine

## 2011-10-18 ENCOUNTER — Ambulatory Visit
Admission: RE | Admit: 2011-10-18 | Discharge: 2011-10-18 | Disposition: A | Discharge status: Home / Self Care | Payer: Medicare Other | Source: Ambulatory Visit | Attending: Family Medicine | Admitting: Family Medicine

## 2011-10-18 DIAGNOSIS — R05 Cough: Secondary | ICD-10-CM | POA: Diagnosis not present

## 2011-10-18 DIAGNOSIS — R634 Abnormal weight loss: Secondary | ICD-10-CM

## 2011-10-18 DIAGNOSIS — I517 Cardiomegaly: Secondary | ICD-10-CM | POA: Diagnosis not present

## 2011-10-18 DIAGNOSIS — R350 Frequency of micturition: Secondary | ICD-10-CM | POA: Diagnosis not present

## 2011-10-18 DIAGNOSIS — R5381 Other malaise: Secondary | ICD-10-CM | POA: Diagnosis not present

## 2011-10-18 DIAGNOSIS — R7301 Impaired fasting glucose: Secondary | ICD-10-CM | POA: Diagnosis not present

## 2011-10-18 DIAGNOSIS — J449 Chronic obstructive pulmonary disease, unspecified: Secondary | ICD-10-CM | POA: Diagnosis not present

## 2011-10-18 DIAGNOSIS — R059 Cough, unspecified: Secondary | ICD-10-CM

## 2011-10-18 DIAGNOSIS — G4700 Insomnia, unspecified: Secondary | ICD-10-CM | POA: Diagnosis not present

## 2011-10-25 DIAGNOSIS — R7301 Impaired fasting glucose: Secondary | ICD-10-CM | POA: Diagnosis not present

## 2011-11-06 DIAGNOSIS — R7301 Impaired fasting glucose: Secondary | ICD-10-CM | POA: Diagnosis not present

## 2011-11-06 DIAGNOSIS — D649 Anemia, unspecified: Secondary | ICD-10-CM | POA: Diagnosis not present

## 2011-11-08 DIAGNOSIS — G5600 Carpal tunnel syndrome, unspecified upper limb: Secondary | ICD-10-CM | POA: Diagnosis not present

## 2011-11-23 ENCOUNTER — Other Ambulatory Visit: Payer: Self-pay | Admitting: Orthopedic Surgery

## 2011-11-29 ENCOUNTER — Encounter (HOSPITAL_BASED_OUTPATIENT_CLINIC_OR_DEPARTMENT_OTHER): Payer: Self-pay | Admitting: *Deleted

## 2011-11-29 DIAGNOSIS — E119 Type 2 diabetes mellitus without complications: Secondary | ICD-10-CM | POA: Diagnosis not present

## 2011-11-29 DIAGNOSIS — D649 Anemia, unspecified: Secondary | ICD-10-CM | POA: Diagnosis not present

## 2011-11-30 DIAGNOSIS — H01009 Unspecified blepharitis unspecified eye, unspecified eyelid: Secondary | ICD-10-CM | POA: Diagnosis not present

## 2011-11-30 NOTE — H&P (Signed)
Tyler Norris is an 77 y.o. male.   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  He presented for evaluation of bilateral hand dysesthesias and a right lateral thigh dysesthesia.  You identified evidence of meralgia paresthetica, possible carpal tunnel syndrome and worked him up with cervical films including a cervical MRI.  The cervical MRI documented significant multilevel cervical degenerative disc disease with anterior listhesis of C4 and C5 of 2 millimeters; significant foraminal narrowing at C4-5, left greater than right; significant foraminal stenosis bilaterally at C5-6 and mild foraminal narrowing at C6-7.  He has no abnormal cord signal.  He is now referred for an upper extremity orthopaedic consult.      Past Medical History  Diagnosis Date  . Kidney stones   . Hyperlipidemia   . GERD (gastroesophageal reflux disease)   . Carpal tunnel syndrome, bilateral   . Sleep apnea     uses cpap every night  . AAA (abdominal aortic aneurysm)     resolved by ct scan 09-2011  . Arthritis   . Anemia     Past Surgical History  Procedure Date  . Appendectomy   . Hernia repair   . Hand surgery     scar repaired lt hand  . Tonsillectomy   . Cystoscopy w/ stone manipulation 2001,2009  . Carpal tunnel release 10/12/2011    Procedure: CARPAL TUNNEL RELEASE;  Surgeon: Wyn Forster., MD;  Location: [facility];  Service: Orthopedics;  Laterality: Left;    Family History  Problem Relation Age of Onset  . Heart disease Father   . Hyperlipidemia Father   . Hypertension Father   . Other Father     VARICOSE VEIN  . Peripheral vascular disease Father    Social History:  reports that he has never smoked. He has  never used smokeless tobacco. He reports that he does not drink alcohol or use illicit drugs.  Allergies:  Allergies  Allergen Reactions  . Lipitor (Atorvastatin Calcium)     Myalgias    No prescriptions prior to admission    No results found for this or any previous visit (from the past 48 hour(s)).  No results found.   Pertinent items are noted in HPI.  Height 5\' 8"  (1.727 m), weight 92.08 kg (203 lb).  General appearance: alert Head: Normocephalic, without obvious abnormality Neck: supple, symmetrical, trachea midline Resp: clear to auscultation bilaterally Cardio: regular rate and rhythm GI: normal findings: bowel sounds normal Extremities:.  Inspection of his hands reveals diminished sweat patterns in the median distribution bilaterally.  He does not have frank thenar atrophy.  He does have findings compatible with degenerative arthritis of his hands including Heberden's and Bouchard's nodes.  He has stiffness of his cervical spine with restricted range of motion, forward flexion/extension, rotation right and left.  I cannot demonstrate any radicular signs or symptoms. Confrontational testing revealed 5/5 strength of pinch, grip, wrist extension, wrist flexion, elbow flexion/extension, shoulder abduction, flexion, shoulder internal/external rotation.  He had hypoactive reflexes at the biceps, triceps, brachioradialis, but symmetrical bilaterally and hypoactive knee jerks. He did not show signs of myelopathy.    I asked Dr. Johna Norris to complete detailed electrodiagnostic studies.  These revealed evidence  of severe bilateral carpal tunnel syndrome with marked decrease in the sensory and motor amplitudes.    Pulses: 2+ and symmetric Skin: normal Neurologic: Grossly normal    Assessment/Plan Impression: Right CTS  Plan: To the OR for right CTR.The procedure, risks,benefits and post-op course were discussed with the patient at length and they were in agreement with the plan.     DASNOIT,Tyler Norris 11/30/2011, 3:32 PM     H&P documentation: 12/01/2011  -History and Physical Reviewed  -Patient has been re-examined  -No change in the plan of care  Wyn Forster, MD

## 2011-12-01 ENCOUNTER — Ambulatory Visit (HOSPITAL_BASED_OUTPATIENT_CLINIC_OR_DEPARTMENT_OTHER): Payer: Medicare Other | Admitting: Certified Registered"

## 2011-12-01 ENCOUNTER — Encounter (HOSPITAL_BASED_OUTPATIENT_CLINIC_OR_DEPARTMENT_OTHER): Payer: Self-pay | Admitting: Certified Registered"

## 2011-12-01 ENCOUNTER — Ambulatory Visit (HOSPITAL_BASED_OUTPATIENT_CLINIC_OR_DEPARTMENT_OTHER)
Admission: RE | Admit: 2011-12-01 | Discharge: 2011-12-01 | Disposition: A | Discharge status: Home / Self Care | Payer: Medicare Other | Source: Ambulatory Visit | Attending: Orthopedic Surgery | Admitting: Orthopedic Surgery

## 2011-12-01 ENCOUNTER — Encounter (HOSPITAL_BASED_OUTPATIENT_CLINIC_OR_DEPARTMENT_OTHER): Payer: Self-pay | Admitting: *Deleted

## 2011-12-01 ENCOUNTER — Encounter (HOSPITAL_BASED_OUTPATIENT_CLINIC_OR_DEPARTMENT_OTHER)
Admission: RE | Disposition: A | Discharge status: Home / Self Care | Payer: Self-pay | Source: Ambulatory Visit | Attending: Orthopedic Surgery

## 2011-12-01 DIAGNOSIS — G5600 Carpal tunnel syndrome, unspecified upper limb: Secondary | ICD-10-CM | POA: Insufficient documentation

## 2011-12-01 DIAGNOSIS — E785 Hyperlipidemia, unspecified: Secondary | ICD-10-CM | POA: Insufficient documentation

## 2011-12-01 DIAGNOSIS — G4730 Sleep apnea, unspecified: Secondary | ICD-10-CM | POA: Insufficient documentation

## 2011-12-01 DIAGNOSIS — K219 Gastro-esophageal reflux disease without esophagitis: Secondary | ICD-10-CM | POA: Insufficient documentation

## 2011-12-01 HISTORY — DX: Unspecified osteoarthritis, unspecified site: M19.90

## 2011-12-01 HISTORY — PX: CARPAL TUNNEL RELEASE: SHX101

## 2011-12-01 HISTORY — DX: Anemia, unspecified: D64.9

## 2011-12-01 HISTORY — DX: Sleep apnea, unspecified: G47.30

## 2011-12-01 SURGERY — CARPAL TUNNEL RELEASE
Anesthesia: General | Site: Wrist | Laterality: Right | Wound class: Clean

## 2011-12-01 MED ORDER — LIDOCAINE HCL 2 % IJ SOLN
INTRAMUSCULAR | Status: DC | PRN
  Administered 2011-12-01: 4 mL

## 2011-12-01 MED ORDER — PROPOFOL 10 MG/ML IV BOLUS
INTRAVENOUS | Status: DC | PRN
  Administered 2011-12-01: 200 mg via INTRAVENOUS

## 2011-12-01 MED ORDER — HYDROMORPHONE HCL PF 1 MG/ML IJ SOLN: 0.2500 mg | INTRAMUSCULAR | Status: DC | PRN

## 2011-12-01 MED ORDER — LIDOCAINE HCL (CARDIAC) 20 MG/ML IV SOLN
INTRAVENOUS | Status: DC | PRN
  Administered 2011-12-01: 100 mg via INTRAVENOUS

## 2011-12-01 MED ORDER — CHLORHEXIDINE GLUCONATE 4 % EX LIQD: 60.0000 mL | Freq: Once | CUTANEOUS | Status: DC

## 2011-12-01 MED ORDER — LACTATED RINGERS IV SOLN
INTRAVENOUS | Status: DC
  Administered 2011-12-01: 11:00:00 via INTRAVENOUS

## 2011-12-01 MED ORDER — HYDROCODONE-ACETAMINOPHEN 5-325 MG PO TABS: ORAL_TABLET | ORAL | Status: DC

## 2011-12-01 MED ORDER — OXYCODONE HCL 5 MG/5ML PO SOLN: 5.0000 mg | Freq: Once | ORAL | Status: DC | PRN

## 2011-12-01 MED ORDER — ONDANSETRON HCL 4 MG/2ML IJ SOLN: 4.0000 mg | Freq: Once | INTRAMUSCULAR | Status: DC | PRN

## 2011-12-01 MED ORDER — FENTANYL CITRATE 0.05 MG/ML IJ SOLN
INTRAMUSCULAR | Status: DC | PRN
  Administered 2011-12-01: 100 ug via INTRAVENOUS

## 2011-12-01 MED ORDER — ONDANSETRON HCL 4 MG/2ML IJ SOLN
INTRAMUSCULAR | Status: DC | PRN
  Administered 2011-12-01: 4 mg via INTRAVENOUS

## 2011-12-01 MED ORDER — OXYCODONE HCL 5 MG PO TABS: 5.0000 mg | ORAL_TABLET | Freq: Once | ORAL | Status: DC | PRN

## 2011-12-01 SURGICAL SUPPLY — 38 items
BANDAGE ADHESIVE 1X3 (GAUZE/BANDAGES/DRESSINGS) IMPLANT
BANDAGE ELASTIC 3 VELCRO ST LF (GAUZE/BANDAGES/DRESSINGS) ×2 IMPLANT
BLADE SURG 15 STRL LF DISP TIS (BLADE) ×1 IMPLANT
BLADE SURG 15 STRL SS (BLADE) ×1
BNDG ESMARK 4X9 LF (GAUZE/BANDAGES/DRESSINGS) ×2 IMPLANT
BRUSH SCRUB EZ PLAIN DRY (MISCELLANEOUS) ×2 IMPLANT
CLOTH BEACON ORANGE TIMEOUT ST (SAFETY) ×2 IMPLANT
CORDS BIPOLAR (ELECTRODE) ×2 IMPLANT
COVER MAYO STAND STRL (DRAPES) ×2 IMPLANT
COVER TABLE BACK 60X90 (DRAPES) ×2 IMPLANT
CUFF TOURNIQUET SINGLE 18IN (TOURNIQUET CUFF) ×2 IMPLANT
DECANTER SPIKE VIAL GLASS SM (MISCELLANEOUS) IMPLANT
DRAPE EXTREMITY T 121X128X90 (DRAPE) ×2 IMPLANT
DRAPE SURG 17X23 STRL (DRAPES) ×2 IMPLANT
GLOVE BIO SURGEON STRL SZ 6.5 (GLOVE) ×2 IMPLANT
GLOVE BIOGEL M STRL SZ7.5 (GLOVE) ×2 IMPLANT
GLOVE BIOGEL PI IND STRL 7.0 (GLOVE) ×1 IMPLANT
GLOVE BIOGEL PI INDICATOR 7.0 (GLOVE) ×1
GLOVE ORTHO TXT STRL SZ7.5 (GLOVE) ×2 IMPLANT
GOWN PREVENTION PLUS XLARGE (GOWN DISPOSABLE) ×2 IMPLANT
GOWN PREVENTION PLUS XXLARGE (GOWN DISPOSABLE) ×4 IMPLANT
NEEDLE 27GAX1X1/2 (NEEDLE) ×2 IMPLANT
PACK BASIN DAY SURGERY FS (CUSTOM PROCEDURE TRAY) ×2 IMPLANT
PAD CAST 3X4 CTTN HI CHSV (CAST SUPPLIES) ×1 IMPLANT
PADDING CAST ABS 4INX4YD NS (CAST SUPPLIES) ×1
PADDING CAST ABS COTTON 4X4 ST (CAST SUPPLIES) ×1 IMPLANT
PADDING CAST COTTON 3X4 STRL (CAST SUPPLIES) ×1
SPLINT PLASTER CAST XFAST 3X15 (CAST SUPPLIES) ×5 IMPLANT
SPLINT PLASTER XTRA FASTSET 3X (CAST SUPPLIES) ×5
SPONGE GAUZE 4X4 12PLY (GAUZE/BANDAGES/DRESSINGS) ×2 IMPLANT
STOCKINETTE 4X48 STRL (DRAPES) ×2 IMPLANT
STRIP CLOSURE SKIN 1/2X4 (GAUZE/BANDAGES/DRESSINGS) ×2 IMPLANT
SUT PROLENE 3 0 PS 2 (SUTURE) ×2 IMPLANT
SYR 3ML 23GX1 SAFETY (SYRINGE) IMPLANT
SYR CONTROL 10ML LL (SYRINGE) ×2 IMPLANT
TRAY DSU PREP LF (CUSTOM PROCEDURE TRAY) ×2 IMPLANT
UNDERPAD 30X30 INCONTINENT (UNDERPADS AND DIAPERS) ×2 IMPLANT
WATER STERILE IRR 1000ML POUR (IV SOLUTION) ×2 IMPLANT

## 2011-12-01 NOTE — Anesthesia Preprocedure Evaluation (Signed)
Anesthesia Evaluation  Patient identified by MRN, date of birth, ID band Patient awake    Reviewed: Allergy & Precautions, H&P , NPO status , Patient's Chart, lab work & pertinent test results  Airway Mallampati: I TM Distance: >3 FB     Dental  (+) Teeth Intact and Dental Advisory Given   Pulmonary Sleep apnea: Uses CPAP daily. ,  breath sounds clear to auscultation        Cardiovascular Rhythm:Regular Rate:Normal     Neuro/Psych    GI/Hepatic GERD-  Medicated and Controlled,  Endo/Other    Renal/GU      Musculoskeletal   Abdominal   Peds  Hematology   Anesthesia Other Findings   Reproductive/Obstetrics                           Anesthesia Physical Anesthesia Plan  ASA: II  Anesthesia Plan: General   Post-op Pain Management:    Induction: Intravenous  Airway Management Planned: LMA  Additional Equipment:   Intra-op Plan:   Post-operative Plan: Extubation in OR  Informed Consent: I have reviewed the patients History and Physical, chart, labs and discussed the procedure including the risks, benefits and alternatives for the proposed anesthesia with the patient or authorized representative who has indicated his/her understanding and acceptance.   Dental advisory given  Plan Discussed with: CRNA, Anesthesiologist and Surgeon  Anesthesia Plan Comments:         Anesthesia Quick Evaluation

## 2011-12-01 NOTE — Transfer of Care (Signed)
Immediate Anesthesia Transfer of Care Note  Patient: Tyler Norris  Procedure(s) Performed: Procedure(s) (LRB) with comments: CARPAL TUNNEL RELEASE (Right)  Patient Location: PACU  Anesthesia Type: General  Level of Consciousness: awake and alert   Airway & Oxygen Therapy: Patient Spontanous Breathing and Patient connected to face mask oxygen  Post-op Assessment: Report given to PACU RN and Post -op Vital signs reviewed and stable  Post vital signs: Reviewed and stable  Complications: No apparent anesthesia complications

## 2011-12-01 NOTE — Op Note (Signed)
[id number]  

## 2011-12-01 NOTE — Brief Op Note (Signed)
12/01/2011  12:33 PM  PATIENT:  Tyler Norris  77 y.o. male  PRE-OPERATIVE DIAGNOSIS:  right ctr  POST-OPERATIVE DIAGNOSIS:  Right ctr  PROCEDURE:  Right carpal tunnel release  SURGEON:  Surgeon(s) and Role:    * Wyn Forster., MD - Primary  PHYSICIAN ASSISTANT:   ASSISTANTS: Mallory Shirk.A-C    ANESTHESIA:   general  EBL:     BLOOD ADMINISTERED:none  DRAINS: none   LOCAL MEDICATIONS USED:   lidocaine   SPECIMEN:  No Specimen  DISPOSITION OF SPECIMEN:  N/A  COUNTS:  correct  TOURNIQUET:  * Missing tourniquet times found for documented tourniquets in log:  [id number] *  DICTATION: .Other Dictation: Dictation Number [id number]  PLAN OF CARE: Discharge to home after PACU  PATIENT DISPOSITION:  PACU - hemodynamically stable.

## 2011-12-01 NOTE — Anesthesia Postprocedure Evaluation (Signed)
  Anesthesia Post-op Note  Patient: Tyler Norris  Procedure(s) Performed: Procedure(s) (LRB) with comments: CARPAL TUNNEL RELEASE (Right)  Patient Location: PACU  Anesthesia Type: General  Level of Consciousness: awake, alert  and oriented  Airway and Oxygen Therapy: Patient Spontanous Breathing  Post-op Pain: none  Post-op Assessment: Post-op Vital signs reviewed, Patient's Cardiovascular Status Stable, Respiratory Function Stable, Patent Airway and No signs of Nausea or vomiting  Post-op Vital Signs: Reviewed and stable  Complications: No apparent anesthesia complications

## 2011-12-01 NOTE — Anesthesia Procedure Notes (Signed)
Procedure Name: LMA Insertion Performed by: Lance Coon Pre-anesthesia Checklist: Patient identified, Emergency Drugs available, Timeout performed, Suction available and Patient being monitored Patient Re-evaluated:Patient Re-evaluated prior to inductionOxygen Delivery Method: Circle system utilized Preoxygenation: Pre-oxygenation with 100% oxygen Intubation Type: IV induction Ventilation: Mask ventilation without difficulty LMA: LMA inserted LMA Size: 5.0 Number of attempts: 1 Placement Confirmation: breath sounds checked- equal and bilateral and positive ETCO2 Dental Injury: Teeth and Oropharynx as per pre-operative assessment

## 2011-12-04 ENCOUNTER — Encounter (HOSPITAL_BASED_OUTPATIENT_CLINIC_OR_DEPARTMENT_OTHER): Payer: Self-pay | Admitting: Orthopedic Surgery

## 2011-12-04 NOTE — Op Note (Signed)
NAME:  Tyler Norris, Tyler Norris NO.:  [id number]  MEDICAL RECORD NO.:  [id number]  LOCATION:                                 FACILITY:  PHYSICIAN:  Katy Fitch. Rian Busche, M.D. DATE OF BIRTH:  12-24-1934  DATE OF PROCEDURE:  12/01/2011 DATE OF DISCHARGE:                              OPERATIVE REPORT   PREOPERATIVE DIAGNOSIS:  Severe right carpal tunnel syndrome with swelling of the ulnar bursa.  POSTOPERATIVE DIAGNOSIS:  Severe carpal tunnel syndrome, rule out polymyalgia rheumatica or other rheumatic condition.  OPERATION:  Release of right transverse carpal ligament.  OPERATING SURGEON:  Katy Fitch. Ezel Vallone, M.D.  ASSISTANT:  Marveen Reeks. Dasnoit, PA-C.  ANESTHESIA:  General by LMA.  SUPERVISING ANESTHESIOLOGIST:  Sheldon Silvan, M.D.  INDICATIONS:  Tyler Norris is a 77 year old gentleman referred for evaluation and management of bilateral hand numbness by Dr. Abigail Miyamoto.  He has a history of polymyalgia rheumatica years ago.  My question is whether or not he may have recurrence at this time and that he has bilateral swelling, multiple arthralgias, myalgias, and bilateral severe carpal tunnel syndrome.  Electrodiagnostic studies revealed very severe slowing of the median motor and sensory conductions.  He is status post left carpal tunnel release with a very satisfactory result.  He now presents for similar surgery on the right.  PROCEDURE:  Tyler Norris was evaluated by Dr. Ivin Booty in the holding area and after anesthesia choices were provided, he selected general anesthesia by LMA technique.  In room 1 under Dr. Ivin Booty' direct supervision, general anesthesia by LMA technique was induced followed by Betadine scrub and paint of the right upper extremity.  Sterile stockinette and impervious arthroscopy drapes were applied followed by exsanguination of the right arm with an Esmarch bandage and inflation of the arterial tourniquet on the proximal brachium to  230 mmHg.  Following a routine surgical time-out, procedure commenced with a 1.5 cm incision in the palm.  Subcutaneous tissues were carefully divided revealing palmar fascia.  This split longitudinally in line of its fibers revealing the common sensory branch of the median nerve.  The ulnar bursa was opaque, gray, gritty and had the appearance of possible chronic tenosynovitis due to a crystal arthropathy or chronic polymyalgia rheumatica.  The canal was sounded with a Penfield 4 elevator and subcutaneous tissues were released with scissors.  The transverse carpal ligament and volar forearm fascia released 6 cm above the distal wrist flexion crease subcutaneously.  The ulnar bursa was very fibrotic.  There was a considerable amount of fluid and there was a pseudoneuroma in the median nerve proximal to the proximal margin of the transverse carpal ligament.  Given these circumstances, I am going to ask Dr. Abigail Miyamoto to repeat labs and look for signs of polymyalgia rheumatica recurring.  The wound was inspected bleeding points followed by repair with intradermal 3-0 Prolene, Steri-Strips, and 2% lidocaine postoperative analgesia.  For aftercare, Tyler Norris was provided a prescription for Vicodin 5 mg 1 or 2 tablets p.o. q.4 hours p.r.n. pain, 20 tablets without refill.  We will see him back for followup in our office in 1 week.     Katy Fitch Azam Gervasi, M.D.     RVS/MEDQ  D:  12/01/2011  T:  12/02/2011  Job:  [id number]  cc:   Chales Salmon. Abigail Miyamoto, M.D.

## 2012-01-08 DIAGNOSIS — G5600 Carpal tunnel syndrome, unspecified upper limb: Secondary | ICD-10-CM | POA: Diagnosis not present

## 2012-01-15 DIAGNOSIS — G5600 Carpal tunnel syndrome, unspecified upper limb: Secondary | ICD-10-CM | POA: Diagnosis not present

## 2012-01-22 DIAGNOSIS — G5600 Carpal tunnel syndrome, unspecified upper limb: Secondary | ICD-10-CM | POA: Diagnosis not present

## 2012-01-29 DIAGNOSIS — E7800 Pure hypercholesterolemia, unspecified: Secondary | ICD-10-CM | POA: Diagnosis not present

## 2012-01-29 DIAGNOSIS — D649 Anemia, unspecified: Secondary | ICD-10-CM | POA: Diagnosis not present

## 2012-01-29 DIAGNOSIS — Z79899 Other long term (current) drug therapy: Secondary | ICD-10-CM | POA: Diagnosis not present

## 2012-01-29 DIAGNOSIS — R7301 Impaired fasting glucose: Secondary | ICD-10-CM | POA: Diagnosis not present

## 2012-01-31 DIAGNOSIS — E7800 Pure hypercholesterolemia, unspecified: Secondary | ICD-10-CM | POA: Diagnosis not present

## 2012-01-31 DIAGNOSIS — G4733 Obstructive sleep apnea (adult) (pediatric): Secondary | ICD-10-CM | POA: Diagnosis not present

## 2012-01-31 DIAGNOSIS — E669 Obesity, unspecified: Secondary | ICD-10-CM | POA: Diagnosis not present

## 2012-01-31 DIAGNOSIS — K219 Gastro-esophageal reflux disease without esophagitis: Secondary | ICD-10-CM | POA: Diagnosis not present

## 2012-01-31 DIAGNOSIS — E119 Type 2 diabetes mellitus without complications: Secondary | ICD-10-CM | POA: Diagnosis not present

## 2012-01-31 DIAGNOSIS — D649 Anemia, unspecified: Secondary | ICD-10-CM | POA: Diagnosis not present

## 2012-02-02 DIAGNOSIS — M47812 Spondylosis without myelopathy or radiculopathy, cervical region: Secondary | ICD-10-CM | POA: Diagnosis not present

## 2012-02-19 DIAGNOSIS — H01009 Unspecified blepharitis unspecified eye, unspecified eyelid: Secondary | ICD-10-CM | POA: Diagnosis not present

## 2012-03-04 DIAGNOSIS — H01009 Unspecified blepharitis unspecified eye, unspecified eyelid: Secondary | ICD-10-CM | POA: Diagnosis not present

## 2012-04-02 DIAGNOSIS — D649 Anemia, unspecified: Secondary | ICD-10-CM | POA: Diagnosis not present

## 2012-04-02 DIAGNOSIS — E119 Type 2 diabetes mellitus without complications: Secondary | ICD-10-CM | POA: Diagnosis not present

## 2012-04-05 DIAGNOSIS — K219 Gastro-esophageal reflux disease without esophagitis: Secondary | ICD-10-CM | POA: Diagnosis not present

## 2012-04-05 DIAGNOSIS — E782 Mixed hyperlipidemia: Secondary | ICD-10-CM | POA: Diagnosis not present

## 2012-04-05 DIAGNOSIS — E669 Obesity, unspecified: Secondary | ICD-10-CM | POA: Diagnosis not present

## 2012-04-05 DIAGNOSIS — D649 Anemia, unspecified: Secondary | ICD-10-CM | POA: Diagnosis not present

## 2012-04-05 DIAGNOSIS — M19019 Primary osteoarthritis, unspecified shoulder: Secondary | ICD-10-CM | POA: Diagnosis not present

## 2012-04-05 DIAGNOSIS — M2550 Pain in unspecified joint: Secondary | ICD-10-CM | POA: Diagnosis not present

## 2012-04-05 DIAGNOSIS — E119 Type 2 diabetes mellitus without complications: Secondary | ICD-10-CM | POA: Diagnosis not present

## 2012-04-05 DIAGNOSIS — G4733 Obstructive sleep apnea (adult) (pediatric): Secondary | ICD-10-CM | POA: Diagnosis not present

## 2012-05-13 ENCOUNTER — Inpatient Hospital Stay (HOSPITAL_COMMUNITY)
Admission: EM | Admit: 2012-05-13 | Discharge: 2012-05-15 | DRG: 195 | Disposition: A | Discharge status: Home / Self Care | Payer: Medicare Other | Attending: Internal Medicine | Admitting: Internal Medicine

## 2012-05-13 ENCOUNTER — Emergency Department (HOSPITAL_COMMUNITY): Payer: Medicare Other

## 2012-05-13 ENCOUNTER — Encounter (HOSPITAL_COMMUNITY): Payer: Self-pay | Admitting: *Deleted

## 2012-05-13 DIAGNOSIS — Z8249 Family history of ischemic heart disease and other diseases of the circulatory system: Secondary | ICD-10-CM | POA: Diagnosis not present

## 2012-05-13 DIAGNOSIS — I152 Hypertension secondary to endocrine disorders: Secondary | ICD-10-CM | POA: Diagnosis present

## 2012-05-13 DIAGNOSIS — I517 Cardiomegaly: Secondary | ICD-10-CM | POA: Diagnosis not present

## 2012-05-13 DIAGNOSIS — Z23 Encounter for immunization: Secondary | ICD-10-CM | POA: Diagnosis not present

## 2012-05-13 DIAGNOSIS — R109 Unspecified abdominal pain: Secondary | ICD-10-CM | POA: Diagnosis not present

## 2012-05-13 DIAGNOSIS — K219 Gastro-esophageal reflux disease without esophagitis: Secondary | ICD-10-CM | POA: Diagnosis present

## 2012-05-13 DIAGNOSIS — J189 Pneumonia, unspecified organism: Principal | ICD-10-CM

## 2012-05-13 DIAGNOSIS — R071 Chest pain on breathing: Secondary | ICD-10-CM

## 2012-05-13 DIAGNOSIS — I714 Abdominal aortic aneurysm, without rupture, unspecified: Secondary | ICD-10-CM

## 2012-05-13 DIAGNOSIS — N200 Calculus of kidney: Secondary | ICD-10-CM | POA: Diagnosis not present

## 2012-05-13 DIAGNOSIS — Z79899 Other long term (current) drug therapy: Secondary | ICD-10-CM

## 2012-05-13 DIAGNOSIS — M2560 Stiffness of unspecified joint, not elsewhere classified: Secondary | ICD-10-CM | POA: Diagnosis present

## 2012-05-13 DIAGNOSIS — G4733 Obstructive sleep apnea (adult) (pediatric): Secondary | ICD-10-CM | POA: Diagnosis not present

## 2012-05-13 DIAGNOSIS — I10 Essential (primary) hypertension: Secondary | ICD-10-CM | POA: Diagnosis not present

## 2012-05-13 DIAGNOSIS — M2550 Pain in unspecified joint: Secondary | ICD-10-CM | POA: Diagnosis present

## 2012-05-13 DIAGNOSIS — R079 Chest pain, unspecified: Secondary | ICD-10-CM | POA: Diagnosis not present

## 2012-05-13 DIAGNOSIS — N400 Enlarged prostate without lower urinary tract symptoms: Secondary | ICD-10-CM | POA: Diagnosis not present

## 2012-05-13 DIAGNOSIS — I2510 Atherosclerotic heart disease of native coronary artery without angina pectoris: Secondary | ICD-10-CM | POA: Diagnosis not present

## 2012-05-13 DIAGNOSIS — E785 Hyperlipidemia, unspecified: Secondary | ICD-10-CM | POA: Diagnosis present

## 2012-05-13 DIAGNOSIS — Z0389 Encounter for observation for other suspected diseases and conditions ruled out: Secondary | ICD-10-CM | POA: Diagnosis not present

## 2012-05-13 DIAGNOSIS — J438 Other emphysema: Secondary | ICD-10-CM | POA: Diagnosis present

## 2012-05-13 DIAGNOSIS — I700 Atherosclerosis of aorta: Secondary | ICD-10-CM | POA: Diagnosis not present

## 2012-05-13 HISTORY — DX: Barrett's esophagus without dysplasia: K22.70

## 2012-05-13 LAB — URINALYSIS, MICROSCOPIC ONLY
Bilirubin Urine: NEGATIVE
Glucose, UA: NEGATIVE mg/dL
Specific Gravity, Urine: 1.027 (ref 1.005–1.030)
Urobilinogen, UA: 0.2 mg/dL (ref 0.0–1.0)
pH: 6 (ref 5.0–8.0)

## 2012-05-13 LAB — CBC WITH DIFFERENTIAL/PLATELET
Basophils Absolute: 0 10*3/uL (ref 0.0–0.1)
Basophils Absolute: 0 10*3/uL (ref 0.0–0.1)
Basophils Relative: 0 % (ref 0–1)
Eosinophils Absolute: 0 10*3/uL (ref 0.0–0.7)
Hemoglobin: 11.6 g/dL — ABNORMAL LOW (ref 13.0–17.0)
Lymphocytes Relative: 20 % (ref 12–46)
Lymphs Abs: 1.4 10*3/uL (ref 0.7–4.0)
MCHC: 33.9 g/dL (ref 30.0–36.0)
Monocytes Relative: 10 % (ref 3–12)
Neutro Abs: 4.2 10*3/uL (ref 1.7–7.7)
Neutrophils Relative %: 70 % (ref 43–77)
Neutrophils Relative %: 71 % (ref 43–77)
Platelets: 264 10*3/uL (ref 150–400)
RBC: 4.36 MIL/uL (ref 4.22–5.81)
WBC: 6.9 10*3/uL (ref 4.0–10.5)

## 2012-05-13 LAB — CREATININE, SERUM
GFR calc Af Amer: >90 mL/min (ref >90)
GFR calc non Af Amer: >90 mL/min (ref >90)

## 2012-05-13 LAB — COMPREHENSIVE METABOLIC PANEL
ALT: 14 U/L (ref 0–53)
AST: 24 U/L (ref 0–37)
Alkaline Phosphatase: 64 U/L (ref 39–117)
CO2: 21 mEq/L (ref 19–32)
GFR calc Af Amer: >90 mL/min (ref >90)
Glucose, Bld: 89 mg/dL (ref 70–99)
Potassium: 4.2 mEq/L (ref 3.5–5.1)
Sodium: 135 mEq/L (ref 135–145)
Total Protein: 6.7 g/dL (ref 6.0–8.3)

## 2012-05-13 LAB — LACTIC ACID, PLASMA: Lactic Acid, Venous: 1.6 mmol/L (ref 0.5–2.2)

## 2012-05-13 LAB — TROPONIN I: Troponin I: <0.3 ng/mL (ref <0.30)

## 2012-05-13 MED ORDER — SODIUM CHLORIDE 0.9 % IV SOLN
INTRAVENOUS | Status: DC
  Administered 2012-05-14 (×2): via INTRAVENOUS

## 2012-05-13 MED ORDER — DEXTROSE 5 % IV SOLN
1.0000 g | INTRAVENOUS | Status: DC
  Administered 2012-05-13: 1 g via INTRAVENOUS
  Filled 2012-05-13 (×2): qty 10

## 2012-05-13 MED ORDER — DEXTROSE 5 % IV SOLN
500.0000 mg | INTRAVENOUS | Status: DC
  Administered 2012-05-13: 500 mg via INTRAVENOUS
  Filled 2012-05-13 (×2): qty 500

## 2012-05-13 MED ORDER — ENOXAPARIN SODIUM 40 MG/0.4ML ~~LOC~~ SOLN
40.0000 mg | SUBCUTANEOUS | Status: DC
  Administered 2012-05-13 – 2012-05-14 (×2): 40 mg via SUBCUTANEOUS
  Filled 2012-05-13 (×3): qty 0.4

## 2012-05-13 MED ORDER — IOHEXOL 350 MG/ML SOLN
100.0000 mL | Freq: Once | INTRAVENOUS | Status: AC | PRN
  Administered 2012-05-13: 100 mL via INTRAVENOUS

## 2012-05-13 MED ORDER — MORPHINE SULFATE 4 MG/ML IJ SOLN
4.0000 mg | Freq: Once | INTRAMUSCULAR | Status: AC
  Administered 2012-05-13: 4 mg via INTRAVENOUS
  Filled 2012-05-13: qty 1

## 2012-05-13 MED ORDER — PANTOPRAZOLE SODIUM 40 MG PO TBEC
40.0000 mg | DELAYED_RELEASE_TABLET | Freq: Every day | ORAL | Status: DC
  Administered 2012-05-13 – 2012-05-15 (×3): 40 mg via ORAL
  Filled 2012-05-13 (×3): qty 1

## 2012-05-13 NOTE — ED Notes (Signed)
Pt c/o LUQ pain just under his (L) breast starting 0230 this am. Pt denies N/V/D, fever, passing gas. Pt's LUQ is tender to touch. Pt's pcp sent him to ED further evaluation to r/o ischemic bowel

## 2012-05-13 NOTE — H&P (Signed)
PCP:  Irving Copas, MD  Neurology: Bettina Gavia  Chief Complaint:   Send in by his PCP due to pleuritic chest pain  HPI: Tyler Norris is a 78 y.o. male   has a past medical history of Kidney stones; Hyperlipidemia; GERD (gastroesophageal reflux disease); Carpal tunnel syndrome, bilateral; Sleep apnea; AAA (abdominal aortic aneurysm); Arthritis; Anemia; and Barrett's esophagus.   Presented with  He has not felt well for few months. Lost 30 lb over the past 5 wks. Patient started to have pleuritic chest pain today and went to his PCP for further evaluation he was immediately sent to ER. [facility] ER he had a CT angio of the chest and abdomen done that showed  LLL pneumonia. His CT scan showed evidence of COPD and CAD as well as cardiomegaly.   He denies any cough, no fever or chills.  He does endorse being extra tired with activity. But states he is able to walk up the stairs without much shortness of breath.  No tobacco exposure. He did travel outside of [place]  extensively.  Patient does endorse occasional leg swelling bilaterally and ankle that he had injured in the past. He have had some joint stiffness in the wrists bilaterally as well.  Review of Systems:     Pertinent positives include: weight loss, fatigue, chills, Nonight sweats, 2 nights ago  Constitutional:   Fevers, weight loss  HEENT:  No headaches, Difficulty swallowing,Tooth/dental problems,Sore throat,  No sneezing, itching, ear ache, nasal congestion, post nasal drip,  Cardio-vascular:  No chest pain, Orthopnea, PND, anasarca, dizziness, palpitations.no Bilateral lower extremity swelling  GI:  No heartburn, indigestion, abdominal pain, nausea, vomiting, diarrhea, change in bowel habits, loss of appetite, melena, blood in stool, hematemesis Resp:  no shortness of breath at rest. No dyspnea on exertion, No excess mucus, no productive cough, No non-productive cough, No coughing up of blood.No change in color of mucus.No  wheezing. Skin:  no rash or lesions. No jaundice GU:  no dysuria, change in color of urine, no urgency or frequency. No straining to urinate.  No flank pain.  Musculoskeletal:  No joint pain or no joint swelling. No decreased range of motion. No back pain.  Psych:  No change in mood or affect. No depression or anxiety. No memory loss.  Neuro: no localizing neurological complaints, no tingling, no weakness, no double vision, no gait abnormality, no slurred speech, no confusion  Otherwise ROS are negative except for above, 10 systems were reviewed  Past Medical History: Past Medical History  Diagnosis Date  . Kidney stones   . Hyperlipidemia   . GERD (gastroesophageal reflux disease)   . Carpal tunnel syndrome, bilateral   . Sleep apnea     uses cpap every night  . AAA (abdominal aortic aneurysm)     resolved by ct scan 09-2011  . Arthritis   . Anemia   . Barrett's esophagus    Past Surgical History  Procedure Laterality Date  . Appendectomy    . Hernia repair    . Hand surgery      scar repaired lt hand  . Tonsillectomy    . Cystoscopy w/ stone manipulation  2001,2009  . Carpal tunnel release  10/12/2011    Procedure: CARPAL TUNNEL RELEASE;  Surgeon: Wyn Forster., MD;  Location: [facility];  Service: Orthopedics;  Laterality: Left;  . Carpal tunnel release  12/01/2011    Procedure: CARPAL TUNNEL RELEASE;  Surgeon: Wyn Forster., MD;  Location: [facility];  Service: Orthopedics;  Laterality: Right;     Medications: Prior to Admission medications   Medication Sig Start Date End Date Taking? Authorizing Provider  esomeprazole (NEXIUM) 40 MG capsule Take 40 mg by mouth daily before breakfast.   Yes Historical Provider, MD  Eyelid Cleansers (OCUSOFT LID SCRUB EX) Apply 1 application topically daily.   Yes Historical Provider, MD  fenofibrate (TRICOR) 145 MG tablet Take 145 mg by mouth daily.   Yes Historical Provider, MD  meloxicam  (MOBIC) 7.5 MG tablet Take 7.5 mg by mouth daily.   Yes Historical Provider, MD  Multiple Vitamin (MULTIVITAMIN) tablet Take 1 tablet by mouth daily.   Yes Historical Provider, MD  rosuvastatin (CRESTOR) 5 MG tablet Take 5 mg by mouth daily.   Yes Historical Provider, MD    Allergies:   Allergies  Allergen Reactions  . Lipitor (Atorvastatin Calcium)     Myalgias    Social History:  Ambulatory   independently   Lives at  Home with family   reports that he has never smoked. He has never used smokeless tobacco. He reports that he does not drink alcohol or use illicit drugs.   Family History: family history includes Heart disease in his father; Hyperlipidemia in his father; Hypertension in his father; Other in his father; and Peripheral vascular disease in his father.    Physical Exam: Patient Vitals for the past 24 hrs:  BP Temp Temp src Pulse Resp SpO2 Height Weight  05/13/12 1944 153/79 mmHg - - 88 18 97 % - -  05/13/12 1915 153/89 mmHg - - 85 - 99 % - -  05/13/12 1844 145/86 mmHg - - 75 14 100 % - -  05/13/12 1618 159/82 mmHg - - 93 16 98 % - -  05/13/12 1415 147/99 mmHg - - 93 - 97 % - -  05/13/12 1317 148/88 mmHg 97.7 F (36.5 C) Oral 92 20 97 % 5\' 9"  (1.753 m) 95.255 kg (210 lb)    1. General:  in No Acute distress 2. Psychological: Alert and  Oriented 3. Head/ENT:   Moist  Mucous Membranes                          Head Non traumatic, neck supple                          Normal  Dentition 4. SKIN: normal   Skin turgor,  Skin clean Dry and intact no rash 5. Heart: Regular rate and rhythm no Murmur, Rub or gallop 6. Lungs: no wheezes mild crackles  present 7. Abdomen: Soft, non-tender, Non distended 8. Lower extremities: no clubbing, cyanosis, or edema 9. Neurologically Grossly intact, moving all 4 extremities equally 10. MSK: Normal range of motion except in bilateral wrists which unable to externally rotate fully  body mass index is 31 kg/(m^2).   Labs on  Admission:   Recent Labs  05/13/12 1408  NA 135  K 4.2  CL 102  CO2 21  GLUCOSE 89  BUN 16  CREATININE 0.63  CALCIUM 9.5    Recent Labs  05/13/12 1408  AST 24  ALT 14  ALKPHOS 64  BILITOT 0.6  PROT 6.7  ALBUMIN 3.2*    Recent Labs  05/13/12 1408  LIPASE 34    Recent Labs  05/13/12 1408  WBC 6.9  NEUTROABS 4.8  HGB 12.5*  HCT  36.6*  MCV 83.9  PLT 264   No results found for this basename: CKTOTAL, CKMB, CKMBINDEX, TROPONINI,  in the last 72 hours No results found for this basename: TSH, T4TOTAL, FREET3, T3FREE, THYROIDAB,  in the last 72 hours No results found for this basename: VITAMINB12, FOLATE, FERRITIN, TIBC, IRON, RETICCTPCT,  in the last 72 hours No results found for this basename: HGBA1C    Estimated Creatinine Clearance: 90.8 ml/min (by C-G formula based on Cr of 0.63). ABG No results found for this basename: phart, pco2, po2, hco3, tco2, acidbasedef, o2sat     No results found for this basename: DDIMER     Other results:  I have pearsonaly reviewed this: ECG REPORT  Rate: 91  Rhythm: NSR ST&T Change: T wave flattening in leads v1 and aVL  UA mild protein uria  Cultures: No results found for this basename: sdes, specrequest, cult, reptstatus       Radiological Exams on Admission: Ct Angio Chest Aorta W/cm &/or Wo/cm  05/13/2012  *RADIOLOGY REPORT*  Clinical Data:  Unexplained chest pain and abdominal pain.  CT ANGIOGRAPHY CHEST, ABDOMEN AND PELVIS  Technique:  Multidetector CT imaging through the chest, abdomen and pelvis was performed using the standard protocol during bolus administration of intravenous contrast.  Multiplanar reconstructed images including MIPs were obtained and reviewed to evaluate the vascular anatomy.  Contrast: OMNIPAQUE IOHEXOL 350 MG/ML.  Comparison:   None.  CTA CHEST  Findings:  Unenhanced images demonstrate no evidence of mural hematoma.  Mild calcified plaque in the aortic arch and descending thoracic  aorta.  Extensive three-vessel coronary atherosclerotic calcification.  Enhanced images demonstrate no evidence of thoracic aortic aneurysm or dissection.  Central pulmonary arteries widely patent.  Heart moderately enlarged with left ventricular predominance.  No pericardial effusion.  Proximal great vessels mildly atherosclerotic though widely patent.  Airspace consolidation with air bronchograms in the left lower lobe and minimally in the lingula.  Associated small left pleural effusion.  Emphysematous changes throughout both lungs.  Dependent atelectasis posteriorly in the right lower lobe.  Lungs otherwise clear.  No evidence of interstitial lung disease.  No right pleural effusion.  No significant mediastinal, hilar, or axillary lymphadenopathy. Visualized thyroid gland with ACE sub-centimeter nodule in the lower pole of the right lobe.  Bone window images demonstrate lower thoracic spondylosis and a hemangioma in the T10 vertebral body.   Review of the MIP images confirms the above findings.  IMPRESSION:  1.  No evidence of thoracic aortic aneurysm or dissection. 2.  Pneumonia involving the left lower lobe and lingula. Associated small left parapneumonic effusion. 3.  Cardiomegaly with left ventricular predominance.  Extensive three-vessel coronary atherosclerosis. 4.  COPD/emphysema.  CTA ABDOMEN AND PELVIS  Findings:  No evidence of abdominal aortic aneurysm or dissection. Mild atherosclerosis involving the distal abdominal aorta. Atherosclerosis at the origin of the celiac artery, SMA, and the left renal artery, without significant stenoses.  IMA patent. Iliofemoral arterial systems widely patent bilaterally with mild atherosclerosis.  Very small simple cysts in the medial segment left lobe anterior segment right lobe of liver; no significant focal hepatic parenchymal abnormalities.  Hyperdense round lesion in the posterior mid spleen likely a small hemangioma; no significant abnormalities involving the  spleen, allowing for the early arterial phase enhancement.  Normal-appearing pancreas, right adrenal gland, and gallbladder.  Approximate 1.2 cm diameter nodule involving the left adrenal gland.  No biliary ductal dilation.  Numerous very small bilateral renal calculi without evidence of obstruction,  the largest stone approximating 4 mm in a lower pole calix of the right kidney.  Approximate 3.5 cm simple cyst arising from the mid left kidney.  No significant focal parenchymal abnormality involving either kidney.  Very small hiatal hernia.  Stomach decompressed otherwise unremarkable.  Normal-appearing small bowel.  Scattered sigmoid colon diverticula without evidence of acute diverticulitis; remainder of the colon unremarkable.  Appendix surgically absent. No ascites.  Mild median lobe prostate gland enlargement consistent with age. Normal seminal vesicles.  Urinary bladder decompressed and unremarkable.  Numerous pelvic phleboliths.  Bone window images demonstrate lower thoracic spondylosis, degenerative disc disease at L5-S1, facet degenerative changes involving the lumbar spine, degenerative changes in the sacroiliac joints, and degenerative changes in the hips.   Review of the MIP images confirms the above findings.  IMPRESSION:  1.  No evidence of abdominal aortic aneurysm or dissection. 2.  Numerous small nonobstructing bilateral renal calculi. 3.  1.2 cm diameter left adrenal nodule, statistically consistent with a small adenoma. 4.  Very small hiatal hernia. 5.  Mild median lobe prostate gland enlargement. 6.  Sigmoid colon diverticulosis without evidence of acute diverticulitis.   Original Report Authenticated By: Hulan Saas, M.D.    Ct Angio Abd/pel W/ And/or W/o  05/13/2012  *RADIOLOGY REPORT*  Clinical Data:  Unexplained chest pain and abdominal pain.  CT ANGIOGRAPHY CHEST, ABDOMEN AND PELVIS  Technique:  Multidetector CT imaging through the chest, abdomen and pelvis was performed using the  standard protocol during bolus administration of intravenous contrast.  Multiplanar reconstructed images including MIPs were obtained and reviewed to evaluate the vascular anatomy.  Contrast: OMNIPAQUE IOHEXOL 350 MG/ML.  Comparison:   None.  CTA CHEST  Findings:  Unenhanced images demonstrate no evidence of mural hematoma.  Mild calcified plaque in the aortic arch and descending thoracic aorta.  Extensive three-vessel coronary atherosclerotic calcification.  Enhanced images demonstrate no evidence of thoracic aortic aneurysm or dissection.  Central pulmonary arteries widely patent.  Heart moderately enlarged with left ventricular predominance.  No pericardial effusion.  Proximal great vessels mildly atherosclerotic though widely patent.  Airspace consolidation with air bronchograms in the left lower lobe and minimally in the lingula.  Associated small left pleural effusion.  Emphysematous changes throughout both lungs.  Dependent atelectasis posteriorly in the right lower lobe.  Lungs otherwise clear.  No evidence of interstitial lung disease.  No right pleural effusion.  No significant mediastinal, hilar, or axillary lymphadenopathy. Visualized thyroid gland with ACE sub-centimeter nodule in the lower pole of the right lobe.  Bone window images demonstrate lower thoracic spondylosis and a hemangioma in the T10 vertebral body.   Review of the MIP images confirms the above findings.  IMPRESSION:  1.  No evidence of thoracic aortic aneurysm or dissection. 2.  Pneumonia involving the left lower lobe and lingula. Associated small left parapneumonic effusion. 3.  Cardiomegaly with left ventricular predominance.  Extensive three-vessel coronary atherosclerosis. 4.  COPD/emphysema.  CTA ABDOMEN AND PELVIS  Findings:  No evidence of abdominal aortic aneurysm or dissection. Mild atherosclerosis involving the distal abdominal aorta. Atherosclerosis at the origin of the celiac artery, SMA, and the left renal artery,  without significant stenoses.  IMA patent. Iliofemoral arterial systems widely patent bilaterally with mild atherosclerosis.  Very small simple cysts in the medial segment left lobe anterior segment right lobe of liver; no significant focal hepatic parenchymal abnormalities.  Hyperdense round lesion in the posterior mid spleen likely a small hemangioma; no significant abnormalities involving the spleen,  allowing for the early arterial phase enhancement.  Normal-appearing pancreas, right adrenal gland, and gallbladder.  Approximate 1.2 cm diameter nodule involving the left adrenal gland.  No biliary ductal dilation.  Numerous very small bilateral renal calculi without evidence of obstruction, the largest stone approximating 4 mm in a lower pole calix of the right kidney.  Approximate 3.5 cm simple cyst arising from the mid left kidney.  No significant focal parenchymal abnormality involving either kidney.  Very small hiatal hernia.  Stomach decompressed otherwise unremarkable.  Normal-appearing small bowel.  Scattered sigmoid colon diverticula without evidence of acute diverticulitis; remainder of the colon unremarkable.  Appendix surgically absent. No ascites.  Mild median lobe prostate gland enlargement consistent with age. Normal seminal vesicles.  Urinary bladder decompressed and unremarkable.  Numerous pelvic phleboliths.  Bone window images demonstrate lower thoracic spondylosis, degenerative disc disease at L5-S1, facet degenerative changes involving the lumbar spine, degenerative changes in the sacroiliac joints, and degenerative changes in the hips.   Review of the MIP images confirms the above findings.  IMPRESSION:  1.  No evidence of abdominal aortic aneurysm or dissection. 2.  Numerous small nonobstructing bilateral renal calculi. 3.  1.2 cm diameter left adrenal nodule, statistically consistent with a small adenoma. 4.  Very small hiatal hernia. 5.  Mild median lobe prostate gland enlargement. 6.   Sigmoid colon diverticulosis without evidence of acute diverticulitis.   Original Report Authenticated By: Hulan Saas, M.D.     Chart has been reviewed  Assessment/Plan  78 yo M no sig medical hx here with pleuritic chest pain and LLL infiltrate worrisome for CAP  Present on Admission:  . CAP (community acquired pneumonia) - who will initiate Rocephin and azithromycin. Oxygen as needed incentive spirometry. Blood cultures if patient decompensates. Would recommend repeat imaging to document clearance of the patient has improved given somewhat atypical presentation and history weight loss. Marland Kitchen HTN (hypertension) - patient is currently not taking any medications it is unclear what his history of hypertension and use of  medications for this in the past. This needs to be followed up father as an outpatient if patient remains to have elevated blood pressures which is not controlled this needs to be addressed father by his PCP.  . OSA on CPAP - continue CPAP machine while asleep  . Chest pain on breathing  - most likely secondary to pneumonia, no evidence of PE per CTA.  . Cardiomegaly - likely over-called on CT scan but given that he has been somewhat symptomatic will order 2 D echo to further evaluate.  30 lb wt loss - patient endorses arthralgias and stiffness in the wrists will screen for Rheumatologic disorders. Will order sed rate, ANA and RF.      Prophylaxis:   Lovenox, Protonix  CODE STATUS: FULL CODE per patient's wishes  Other plan as per orders.  I have spent a total of 55 min on this admission  Tyia Binford 05/13/2012, 7:51 PM

## 2012-05-13 NOTE — ED Notes (Signed)
Pt sent to ED from Dr Darrol Angel office for LUQ pain since 2 am.  Pt states the pains are intermittent and take away his breath when they come.  Triage was told over the phone that the pt has hx of ischemic bowel. This dx is not in his hx and states today is the first time he heard of it.  Pain 8/10 to LUQ.

## 2012-05-13 NOTE — ED Provider Notes (Signed)
History     CSN: [id number]  Arrival date & time 05/13/12  1307   First MD Initiated Contact with Patient 05/13/12 1355      Chief Complaint  Patient presents with  . Abdominal Pain    (Consider location/radiation/quality/duration/timing/severity/associated sxs/prior treatment) Patient is a 78 y.o. male presenting with abdominal pain. The history is provided by the patient.  Abdominal Pain  patient here complaining of left upper quadrant pain since 2 AM. Pain is characterized as sharp and has been colicky. Symptoms last for seconds and nothing makes them better or worse however it is nonpleuritic and localized to his left upper quadrant. Denies any associated chest pressure. No fever, chills, vomiting, diarrhea. Symptoms resolved spontaneously. Patient saw his Dr. today was sent here for further evaluation  Past Medical History  Diagnosis Date  . Kidney stones   . Hyperlipidemia   . GERD (gastroesophageal reflux disease)   . Carpal tunnel syndrome, bilateral   . Sleep apnea     uses cpap every night  . AAA (abdominal aortic aneurysm)     resolved by ct scan 09-2011  . Arthritis   . Anemia   . Barrett's esophagus     Past Surgical History  Procedure Laterality Date  . Appendectomy    . Hernia repair    . Hand surgery      scar repaired lt hand  . Tonsillectomy    . Cystoscopy w/ stone manipulation  2001,2009  . Carpal tunnel release  10/12/2011    Procedure: CARPAL TUNNEL RELEASE;  Surgeon: Wyn Forster., MD;  Location: [facility];  Service: Orthopedics;  Laterality: Left;  . Carpal tunnel release  12/01/2011    Procedure: CARPAL TUNNEL RELEASE;  Surgeon: Wyn Forster., MD;  Location: [facility];  Service: Orthopedics;  Laterality: Right;    Family History  Problem Relation Age of Onset  . Heart disease Father   . Hyperlipidemia Father   . Hypertension Father   . Other Father     VARICOSE VEIN  . Peripheral vascular disease  Father     History  Substance Use Topics  . Smoking status: Never Smoker   . Smokeless tobacco: Never Used  . Alcohol Use: No      Review of Systems  Gastrointestinal: Positive for abdominal pain.  All other systems reviewed and are negative.    Allergies  Lipitor  Home Medications   Current Outpatient Rx  Name  Route  Sig  Dispense  Refill  . esomeprazole (NEXIUM) 40 MG capsule   Oral   Take 40 mg by mouth daily before breakfast.         . Eyelid Cleansers (OCUSOFT LID SCRUB EX)   Apply externally   Apply 1 application topically daily.         . fenofibrate (TRICOR) 145 MG tablet   Oral   Take 145 mg by mouth daily.         . meloxicam (MOBIC) 7.5 MG tablet   Oral   Take 7.5 mg by mouth daily.         . Multiple Vitamin (MULTIVITAMIN) tablet   Oral   Take 1 tablet by mouth daily.         . rosuvastatin (CRESTOR) 5 MG tablet   Oral   Take 5 mg by mouth daily.           BP 148/88  Pulse 92  Temp(Src) 97.7 F (36.5 C) (  Oral)  Resp 20  Ht 5\' 9"  (1.753 m)  Wt 210 lb (95.255 kg)  BMI 31 kg/m2  SpO2 97%  Physical Exam  Nursing note and vitals reviewed. Constitutional: He is oriented to person, place, and time. He appears well-developed and well-nourished.  Non-toxic appearance. No distress.  HENT:  Head: Normocephalic and atraumatic.  Eyes: Conjunctivae, EOM and lids are normal. Pupils are equal, round, and reactive to light.  Neck: Normal range of motion. Neck supple. No tracheal deviation present. No mass present.  Cardiovascular: Normal rate, regular rhythm and normal heart sounds.  Exam reveals no gallop.   No murmur heard. Pulmonary/Chest: Effort normal and breath sounds normal. No stridor. No respiratory distress. He has no decreased breath sounds. He has no wheezes. He has no rhonchi. He has no rales.  Abdominal: Soft. Normal appearance and bowel sounds are normal. He exhibits no distension. There is no tenderness. There is no  rigidity, no rebound, no guarding and no CVA tenderness.  Musculoskeletal: Normal range of motion. He exhibits no edema and no tenderness.  Neurological: He is alert and oriented to person, place, and time. He has normal strength. No cranial nerve deficit or sensory deficit. GCS eye subscore is 4. GCS verbal subscore is 5. GCS motor subscore is 6.  Skin: Skin is warm and dry. No abrasion and no rash noted.  Psychiatric: He has a normal mood and affect. His speech is normal and behavior is normal.    ED Course  Procedures (including critical care time)  Labs Reviewed  CBC WITH DIFFERENTIAL  COMPREHENSIVE METABOLIC PANEL  LIPASE, BLOOD  URINALYSIS, MICROSCOPIC ONLY  LACTIC ACID, PLASMA   No results found.   No diagnosis found.    MDM   Date: 05/13/2012  Rate: 96  Rhythm: normal sinus rhythm  QRS Axis: left  Intervals: normal  ST/T Wave abnormalities: normal  Conduction Disutrbances:none  Narrative Interpretation:   Old EKG Reviewed: none available   7:03 PM Patient's chest CT shows pneumonia and patient be started on antibiotics and admitted to the hospital due to his persistent shortness of breath and weakness       Toy Baker, MD 05/13/12 [phone]

## 2012-05-13 NOTE — ED Notes (Signed)
MD at bedside. Consult 

## 2012-05-14 ENCOUNTER — Encounter (HOSPITAL_COMMUNITY): Payer: Self-pay | Admitting: General Practice

## 2012-05-14 DIAGNOSIS — I517 Cardiomegaly: Secondary | ICD-10-CM

## 2012-05-14 DIAGNOSIS — J189 Pneumonia, unspecified organism: Secondary | ICD-10-CM

## 2012-05-14 LAB — STREP PNEUMONIAE URINARY ANTIGEN: Strep Pneumo Urinary Antigen: NEGATIVE

## 2012-05-14 LAB — COMPREHENSIVE METABOLIC PANEL
AST: 17 U/L (ref 0–37)
Albumin: 2.7 g/dL — ABNORMAL LOW (ref 3.5–5.2)
Calcium: 8.8 mg/dL (ref 8.4–10.5)
Chloride: 102 mEq/L (ref 96–112)
Creatinine, Ser: 0.67 mg/dL (ref 0.50–1.35)
Sodium: 136 mEq/L (ref 135–145)

## 2012-05-14 LAB — SEDIMENTATION RATE
Sed Rate: 55 mm/hr — ABNORMAL HIGH (ref 0–16)
Sed Rate: 60 mm/hr — ABNORMAL HIGH (ref 0–16)

## 2012-05-14 LAB — CK: Total CK: 34 U/L (ref 7–232)

## 2012-05-14 LAB — VITAMIN B12: Vitamin B-12: 875 pg/mL (ref 211–911)

## 2012-05-14 MED ORDER — PNEUMOCOCCAL VAC POLYVALENT 25 MCG/0.5ML IJ INJ
0.5000 mL | INJECTION | Freq: Once | INTRAMUSCULAR | Status: AC
  Administered 2012-05-15: 0.5 mL via INTRAMUSCULAR
  Filled 2012-05-14 (×2): qty 0.5

## 2012-05-14 MED ORDER — LEVOFLOXACIN IN D5W 750 MG/150ML IV SOLN
750.0000 mg | INTRAVENOUS | Status: DC
  Administered 2012-05-14: 750 mg via INTRAVENOUS
  Filled 2012-05-14 (×2): qty 150

## 2012-05-14 NOTE — Progress Notes (Signed)
  Echocardiogram 2D Echocardiogram has been performed.  Tyler Norris 05/14/2012, 2:43 PM

## 2012-05-14 NOTE — Evaluation (Signed)
Physical Therapy Evaluation Patient Details Name: Tyler Norris MRN: [id number] DOB: [birth year] Today's Date: 05/14/2012 Time: 8295-6213 PT Time Calculation (min): 28 min  PT Assessment / Plan / Recommendation Clinical Impression  Pt is a pleasant 78 y.o M adm to [facility] secondary to Community acquired PNA. Pt amb at supervision level using IV pole to steady 200 ft. Pt amb on RA and O2 stat stayed around 95%. Pt states he feels as though he is at baseline and is walking like he did prior to admission. Pt states he goes to [facility] for his "hand therapy". Encouraged pt to attend [facility]PP[facility] if he becomes unsteady or his balance deficits become worse; pt agreeable. No acute PT needs at this time.     PT Assessment  Patent does not need any further PT services    Follow Up Recommendations  No PT follow up    Does the patient have the potential to tolerate intense rehabilitation      Barriers to Discharge        Equipment Recommendations  None recommended by PT    Recommendations for Other Services     Frequency      Precautions / Restrictions Precautions Precautions: Fall Restrictions Weight Bearing Restrictions: No   Pertinent Vitals/Pain Denies pain. No new c/o.      Mobility  Bed Mobility Bed Mobility: Supine to Sit;Sitting - Scoot to Edge of Bed Supine to Sit: 6: Modified independent (Device/Increase time);With rails Sitting - Scoot to Edge of Bed: 6: Modified independent (Device/Increase time) Details for Bed Mobility Assistance: demo safe technique with bed mobility Transfers Transfers: Sit to Stand;Stand to Sit Sit to Stand: From bed;6: Modified independent (Device/Increase time) Stand to Sit: 6: Modified independent (Device/Increase time);To chair/3-in-1;With armrests Details for Transfer Assistance: demo safe technique and indpendence with transfer Ambulation/Gait Ambulation/Gait Assistance: 5: Supervision Ambulation Distance (Feet): 200 Feet Assistive device:  (IV  pole) Ambulation/Gait Assistance Details: amb on RA; O2 stats stayed around 95% with amb. Pt used IV pole but said he wouldnt need it. demo safety awareness with ambulating. slight wheezing noticed in breathing pattern. Demo minimal balance deficits especially with turns.  Gait Pattern: Wide base of support (ER bil LEs) General Gait Details: pt stated that he feels like he is walking like he did before he came into hospital Stairs: No Wheelchair Mobility Wheelchair Mobility: No    Exercises     PT Diagnosis:    PT Problem List:   PT Treatment Interventions:     PT Goals    Visit Information  Last PT Received On: 05/14/12 Assistance Needed: +1    Subjective Data  Subjective: I am able to walk by myself. I want to go to the bathroom. 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[employer]) Communication Communication: No difficulties    Cognition  Cognition Overall Cognitive Status: Appears within functional limits for tasks assessed/performed Arousal/Alertness: Awake/alert Orientation Level: Oriented X4 / Intact Behavior During Session: [facility] for tasks performed    Extremity/Trunk Assessment Right Lower Extremity Assessment RLE ROM/Strength/Tone: [facility] for tasks assessed RLE Sensation: WFL - Light Touch Left Lower Extremity Assessment LLE ROM/Strength/Tone: [facility] for tasks  assessed LLE Sensation: WFL - Light Touch   Balance Balance Balance Assessed: Yes Static Standing Balance Static Standing - Balance Support: No upper extremity supported Static Standing - Level of Assistance: 5:  Stand by assistance Dynamic Standing Balance Dynamic Standing - Balance Support: No upper extremity supported Dynamic Standing - Level of Assistance: 5: Stand by assistance  End of Session PT - End of Session Equipment Utilized During Treatment: Gait belt Activity Tolerance: Patient tolerated treatment well Patient left: in chair;with call bell/phone within reach Nurse Communication: Mobility status (O2 stats during ambulation 95 % on RA)  GP     Tyler Norris, [place] [phone] 05/14/2012, 3:17 PM

## 2012-05-14 NOTE — Progress Notes (Signed)
Patient ID: Tyler Norris  male  ZOX:[id number]    DOB: 10/27/1934    DOA: 05/13/2012  PCP: Irving Copas, MD  Assessment/Plan: Active Problems:   CAP (community acquired pneumonia)/ left lung pneumonia with pleuritic chest pain - Continue IV levofloxacin, CT angiogram of the chest done, no pulmonary embolism - CTA chest showed emphysema changes, may benefit from outpatient pulmonology evaluation, PFTs once pneumonia has resolved - Follow cultures    HTN (hypertension): Stable continue home medications  Dyspnea: Multifactorial likely sec to PNA, emphysema, OSA, ?cardiac cause  - 2-D echo pending today  Weight loss with history of fibromyalgia and Joint pains/stiffness: Per patient his weight loss of 30 pounds was 8 months ago, he did regain some back, then lost about 9 pounds . - TSH, B12, folate, ESR, HIV, ANA, rheumatoid factor - He should have a outpatient rheumatology referral - Obtain medical records from his PCP  DVT Prophylaxis:  Code Status:  Disposition: PT evaluation, if medically stable dc in am    Subjective: Currently stable, no acute shortness of breath or chest pain  Objective: Weight change:   Intake/Output Summary (Last 24 hours) at 05/14/12 1228 Last data filed at 05/14/12 0512  Gross per 24 hour  Intake 543.75 ml  Output      0 ml  Net 543.75 ml   Blood pressure 153/74, pulse 87, temperature 97.8 F (36.6 C), temperature source Oral, resp. rate 20, height 5\' 9"  (1.753 m), weight 95.255 kg (210 lb), SpO2 95.00%.  Physical Exam: General: Alert and awake, oriented x3, not in any acute distress. CVS: S1-S2 clear, no murmur rubs or gallops Chest: clear to auscultation bilaterally, no wheezing, rales or rhonchi Abdomen: soft nontender, nondistended, normal bowel sounds Extremities: no cyanosis, clubbing or edema noted bilaterally Neuro: Cranial nerves II-XII intact, no focal neurological deficits  Lab Results: Basic Metabolic  Panel:  Recent Labs Lab 05/13/12 1408 05/13/12 2126 05/14/12 0545  NA 135  --  136  K 4.2  --  3.8  CL 102  --  102  CO2 21  --  24  GLUCOSE 89  --  118*  BUN 16  --  14  CREATININE 0.63 0.67 0.67  CALCIUM 9.5  --  8.8   Liver Function Tests:  Recent Labs Lab 05/13/12 1408 05/14/12 0545  AST 24 17  ALT 14 11  ALKPHOS 64 57  BILITOT 0.6 0.5  PROT 6.7 5.9*  ALBUMIN 3.2* 2.7*    Recent Labs Lab 05/13/12 1408  LIPASE 34   No results found for this basename: AMMONIA,  in the last 168 hours CBC:  Recent Labs Lab 05/13/12 1408 05/13/12 2126  WBC 6.9 6.0  NEUTROABS 4.8 4.2  HGB 12.5* 11.6*  HCT 36.6* 34.2*  MCV 83.9 83.6  PLT 264 262   Cardiac Enzymes:  Recent Labs Lab 05/13/12 2126  TROPONINI <0.30   BNP: No components found with this basename: POCBNP,  CBG: No results found for this basename: GLUCAP,  in the last 168 hours   Micro Results: No results found for this or any previous visit (from the past 240 hour(s)).  Studies/Results: Ct Angio Chest Aorta W/cm &/or Wo/cm  05/13/2012  *RADIOLOGY REPORT*  Clinical Data:  Unexplained chest pain and abdominal pain.  CT ANGIOGRAPHY CHEST, ABDOMEN AND PELVIS  Technique:  Multidetector CT imaging through the chest, abdomen and pelvis was performed using the standard protocol during bolus administration of intravenous contrast.  Multiplanar reconstructed images including MIPs were  obtained and reviewed to evaluate the vascular anatomy.  Contrast: OMNIPAQUE IOHEXOL 350 MG/ML.  Comparison:   None.  CTA CHEST  Findings:  Unenhanced images demonstrate no evidence of mural hematoma.  Mild calcified plaque in the aortic arch and descending thoracic aorta.  Extensive three-vessel coronary atherosclerotic calcification.  Enhanced images demonstrate no evidence of thoracic aortic aneurysm or dissection.  Central pulmonary arteries widely patent.  Heart moderately enlarged with left ventricular predominance.  No  pericardial effusion.  Proximal great vessels mildly atherosclerotic though widely patent.  Airspace consolidation with air bronchograms in the left lower lobe and minimally in the lingula.  Associated small left pleural effusion.  Emphysematous changes throughout both lungs.  Dependent atelectasis posteriorly in the right lower lobe.  Lungs otherwise clear.  No evidence of interstitial lung disease.  No right pleural effusion.  No significant mediastinal, hilar, or axillary lymphadenopathy. Visualized thyroid gland with ACE sub-centimeter nodule in the lower pole of the right lobe.  Bone window images demonstrate lower thoracic spondylosis and a hemangioma in the T10 vertebral body.   Review of the MIP images confirms the above findings.  IMPRESSION:  1.  No evidence of thoracic aortic aneurysm or dissection. 2.  Pneumonia involving the left lower lobe and lingula. Associated small left parapneumonic effusion. 3.  Cardiomegaly with left ventricular predominance.  Extensive three-vessel coronary atherosclerosis. 4.  COPD/emphysema.  CTA ABDOMEN AND PELVIS  Findings:  No evidence of abdominal aortic aneurysm or dissection. Mild atherosclerosis involving the distal abdominal aorta. Atherosclerosis at the origin of the celiac artery, SMA, and the left renal artery, without significant stenoses.  IMA patent. Iliofemoral arterial systems widely patent bilaterally with mild atherosclerosis.  Very small simple cysts in the medial segment left lobe anterior segment right lobe of liver; no significant focal hepatic parenchymal abnormalities.  Hyperdense round lesion in the posterior mid spleen likely a small hemangioma; no significant abnormalities involving the spleen, allowing for the early arterial phase enhancement.  Normal-appearing pancreas, right adrenal gland, and gallbladder.  Approximate 1.2 cm diameter nodule involving the left adrenal gland.  No biliary ductal dilation.  Numerous very small bilateral renal  calculi without evidence of obstruction, the largest stone approximating 4 mm in a lower pole calix of the right kidney.  Approximate 3.5 cm simple cyst arising from the mid left kidney.  No significant focal parenchymal abnormality involving either kidney.  Very small hiatal hernia.  Stomach decompressed otherwise unremarkable.  Normal-appearing small bowel.  Scattered sigmoid colon diverticula without evidence of acute diverticulitis; remainder of the colon unremarkable.  Appendix surgically absent. No ascites.  Mild median lobe prostate gland enlargement consistent with age. Normal seminal vesicles.  Urinary bladder decompressed and unremarkable.  Numerous pelvic phleboliths.  Bone window images demonstrate lower thoracic spondylosis, degenerative disc disease at L5-S1, facet degenerative changes involving the lumbar spine, degenerative changes in the sacroiliac joints, and degenerative changes in the hips.   Review of the MIP images confirms the above findings.  IMPRESSION:  1.  No evidence of abdominal aortic aneurysm or dissection. 2.  Numerous small nonobstructing bilateral renal calculi. 3.  1.2 cm diameter left adrenal nodule, statistically consistent with a small adenoma. 4.  Very small hiatal hernia. 5.  Mild median lobe prostate gland enlargement. 6.  Sigmoid colon diverticulosis without evidence of acute diverticulitis.   Original Report Authenticated By: Hulan Saas, M.D.    Ct Angio Abd/pel W/ And/or W/o  05/13/2012  *RADIOLOGY REPORT*  Clinical Data:  Unexplained chest  pain and abdominal pain.  CT ANGIOGRAPHY CHEST, ABDOMEN AND PELVIS  Technique:  Multidetector CT imaging through the chest, abdomen and pelvis was performed using the standard protocol during bolus administration of intravenous contrast.  Multiplanar reconstructed images including MIPs were obtained and reviewed to evaluate the vascular anatomy.  Contrast: OMNIPAQUE IOHEXOL 350 MG/ML.  Comparison:   None.  CTA CHEST   Findings:  Unenhanced images demonstrate no evidence of mural hematoma.  Mild calcified plaque in the aortic arch and descending thoracic aorta.  Extensive three-vessel coronary atherosclerotic calcification.  Enhanced images demonstrate no evidence of thoracic aortic aneurysm or dissection.  Central pulmonary arteries widely patent.  Heart moderately enlarged with left ventricular predominance.  No pericardial effusion.  Proximal great vessels mildly atherosclerotic though widely patent.  Airspace consolidation with air bronchograms in the left lower lobe and minimally in the lingula.  Associated small left pleural effusion.  Emphysematous changes throughout both lungs.  Dependent atelectasis posteriorly in the right lower lobe.  Lungs otherwise clear.  No evidence of interstitial lung disease.  No right pleural effusion.  No significant mediastinal, hilar, or axillary lymphadenopathy. Visualized thyroid gland with ACE sub-centimeter nodule in the lower pole of the right lobe.  Bone window images demonstrate lower thoracic spondylosis and a hemangioma in the T10 vertebral body.   Review of the MIP images confirms the above findings.  IMPRESSION:  1.  No evidence of thoracic aortic aneurysm or dissection. 2.  Pneumonia involving the left lower lobe and lingula. Associated small left parapneumonic effusion. 3.  Cardiomegaly with left ventricular predominance.  Extensive three-vessel coronary atherosclerosis. 4.  COPD/emphysema.  CTA ABDOMEN AND PELVIS  Findings:  No evidence of abdominal aortic aneurysm or dissection. Mild atherosclerosis involving the distal abdominal aorta. Atherosclerosis at the origin of the celiac artery, SMA, and the left renal artery, without significant stenoses.  IMA patent. Iliofemoral arterial systems widely patent bilaterally with mild atherosclerosis.  Very small simple cysts in the medial segment left lobe anterior segment right lobe of liver; no significant focal hepatic parenchymal  abnormalities.  Hyperdense round lesion in the posterior mid spleen likely a small hemangioma; no significant abnormalities involving the spleen, allowing for the early arterial phase enhancement.  Normal-appearing pancreas, right adrenal gland, and gallbladder.  Approximate 1.2 cm diameter nodule involving the left adrenal gland.  No biliary ductal dilation.  Numerous very small bilateral renal calculi without evidence of obstruction, the largest stone approximating 4 mm in a lower pole calix of the right kidney.  Approximate 3.5 cm simple cyst arising from the mid left kidney.  No significant focal parenchymal abnormality involving either kidney.  Very small hiatal hernia.  Stomach decompressed otherwise unremarkable.  Normal-appearing small bowel.  Scattered sigmoid colon diverticula without evidence of acute diverticulitis; remainder of the colon unremarkable.  Appendix surgically absent. No ascites.  Mild median lobe prostate gland enlargement consistent with age. Normal seminal vesicles.  Urinary bladder decompressed and unremarkable.  Numerous pelvic phleboliths.  Bone window images demonstrate lower thoracic spondylosis, degenerative disc disease at L5-S1, facet degenerative changes involving the lumbar spine, degenerative changes in the sacroiliac joints, and degenerative changes in the hips.   Review of the MIP images confirms the above findings.  IMPRESSION:  1.  No evidence of abdominal aortic aneurysm or dissection. 2.  Numerous small nonobstructing bilateral renal calculi. 3.  1.2 cm diameter left adrenal nodule, statistically consistent with a small adenoma. 4.  Very small hiatal hernia. 5.  Mild median lobe  prostate gland enlargement. 6.  Sigmoid colon diverticulosis without evidence of acute diverticulitis.   Original Report Authenticated By: Hulan Saas, M.D.     Medications: Scheduled Meds: . enoxaparin (LOVENOX) injection  40 mg Subcutaneous Q24H  . levofloxacin (LEVAQUIN) IV  750 mg  Intravenous Q24H  . pantoprazole  40 mg Oral Daily  . pneumococcal 23 valent vaccine  0.5 mL Intramuscular Once      LOS: 1 day   Palmer Fahrner M.D. [facility] 05/14/2012, 12:28 PM Pager: [phone]  If 7PM-7AM, please contact night-coverage [email or web address] Password [email or web address]

## 2012-05-15 DIAGNOSIS — I10 Essential (primary) hypertension: Secondary | ICD-10-CM

## 2012-05-15 DIAGNOSIS — J189 Pneumonia, unspecified organism: Secondary | ICD-10-CM | POA: Diagnosis not present

## 2012-05-15 DIAGNOSIS — R071 Chest pain on breathing: Secondary | ICD-10-CM | POA: Diagnosis not present

## 2012-05-15 DIAGNOSIS — I517 Cardiomegaly: Secondary | ICD-10-CM | POA: Diagnosis not present

## 2012-05-15 LAB — C3 COMPLEMENT: C3 Complement: 94 mg/dL (ref 90–180)

## 2012-05-15 LAB — LEGIONELLA ANTIGEN, URINE

## 2012-05-15 LAB — C4 COMPLEMENT: Complement C4, Body Fluid: 30 mg/dL (ref 10–40)

## 2012-05-15 LAB — CYCLIC CITRUL PEPTIDE ANTIBODY, IGG: Cyclic Citrullin Peptide Ab: <2 U/mL (ref 0.0–5.0)

## 2012-05-15 MED ORDER — LEVOFLOXACIN 750 MG PO TABS: 750.0000 mg | ORAL_TABLET | Freq: Every day | ORAL | Status: DC

## 2012-05-15 MED ORDER — LEVOFLOXACIN 750 MG PO TABS
750.0000 mg | ORAL_TABLET | Freq: Every day | ORAL | Status: DC
  Administered 2012-05-15: 750 mg via ORAL
  Filled 2012-05-15: qty 1

## 2012-05-15 NOTE — Progress Notes (Signed)
Completed discharge instructions with patient. Prescription for levaquin given to patient. Patient then wheeled down in wheelchair with volunteers.

## 2012-05-15 NOTE — Progress Notes (Signed)
Patient requested to just wear Nasal cannula tonight. Said while he was here he would be okay not to wear his CPAP. RN reported this to me. Patient doing well on [place] at 2l sats 96% . Will continue to monitor.

## 2012-05-15 NOTE — Progress Notes (Signed)
Patient information faxed to Rheumatologist who Dr Isidoro Donning referred him to.

## 2012-05-15 NOTE — Discharge Summary (Signed)
Physician Discharge Summary  Patient ID: Tyler Norris MRN: [id number] DOB/AGE: 76-05-1934 76 y.o.  Admit date: 05/13/2012 Discharge date: 05/15/2012  Primary Care Physician:  Tyler Copas, MD  Discharge Diagnoses:    . CAP (community acquired pneumonia) . HTN (hypertension) . OSA on CPAP . Chest pain on breathing . Cardiomegaly Weight loss with joint pains and stiffness   Consults: None   Discharge Medications:   Medication List    TAKE these medications       esomeprazole 40 MG capsule  Commonly known as:  NEXIUM  Take 40 mg by mouth daily before breakfast.     fenofibrate 145 MG tablet  Commonly known as:  TRICOR  Take 145 mg by mouth daily.     levofloxacin 750 MG tablet  Commonly known as:  LEVAQUIN  Take 1 tablet (750 mg total) by mouth daily. X 1 week     meloxicam 7.5 MG tablet  Commonly known as:  MOBIC  Take 7.5 mg by mouth daily.     multivitamin tablet  Take 1 tablet by mouth daily.     OCUSOFT LID SCRUB EX  Apply 1 application topically daily.     rosuvastatin 5 MG tablet  Commonly known as:  CRESTOR  Take 5 mg by mouth daily.         Brief H and P: For complete details please refer to admission H and P, but in briefFrederick K Norris is a 78 y.o. male with past medical history of  Hyperlipidemia; GERD, Carpal tunnel syndrome, bilateral; Sleep apnea; AAA, Arthritis; Anemia; and Barrett's esophagus.  Presented with pleuritic chest pain on the day of admission and went to his PCP for further evaluation. He was immediately sent to ER. IN [facility] ER he had a CT angio of the chest and abdomen done that showed LLL pneumonia. His CT scan showed evidence of COPD and CAD as well as cardiomegaly. He denied any cough, no fever or chills.  He does endorse being extra tired with activity. But states he is able to walk up the stairs without much shortness of breath. No tobacco exposure. He did travel outside of [place] extensively in 1990's. Patient does  endorse occasional leg swelling bilaterally and ankle that he had injured in the past. He have had some joint stiffness in the wrists bilaterally as well. He reported weight loss history of 30 pounds 8 months ago.   Hospital Course:  CAP (community acquired pneumonia)/ left lung pneumonia with pleuritic chest pain: Improved CT angiogram of the chest was done which showed no pulmonary embolism although he did have some emphysema, cardiomegaly. Patient was placed on IV levofloxacin which was transitioned to oral levofloxacin for course of one week. CT angiogram of the chest showed emphysema changes, he may benefit from outpatient pulmonology evaluation, pulmonary function tests once pneumonia has resolved. Blood cultures, HIV, urine strep antigen, Legionella antigen were negative so far.  HTN (hypertension): Stable continue home medications   Dyspnea: Multifactorial likely sec to PNA, emphysema, OSA, ?cardiac cause. 2-D echocardiogram was done which showed EF of 50 pack 60%, no regional wall motion abnormalities, no valvular abnormalities, no pericardial effusion. He should have pulmonary function test done once his pneumonia has resolved.   Weight loss with history of fibromyalgia and Joint pains/stiffness: Per patient, his weight loss of 30 pounds was 8 months ago, he did regain some back, then lost about 9 pounds. He then stated that he had bilateral carpal tunnel surgery with only moderate  improvement in his symptoms. Rheumatology workup was initiated, ESR, CRP was somewhat elevated. Complement levels were normal. ANA was positive with homogeneous pattern and elevated titer 1:1280, rheumatoid factor was mildly positive at 18. Autoimmune workup was initiated, lab work (CCP Ab, DS-DNA, ant-smith Ab, CK,  are still pending). I made him an appointment with Dr. Lysbeth Norris (rheumatology) on April 22nd.     Day of Discharge BP 156/76  Pulse 91  Temp(Src) 98.4 F (36.9 C) (Oral)  Resp 16  Ht 5'  9" (1.753 m)  Wt 95.255 kg (210 lb)  BMI 31 kg/m2  SpO2 98%  Physical Exam: General: Alert and awake oriented x3 not in any acute distress. CVS: S1-S2 clear no murmur rubs or gallops Chest: clear to auscultation bilaterally, no wheezing rales or rhonchi Abdomen: soft nontender, nondistended, normal bowel sounds Extremities: no cyanosis, clubbing or edema noted bilaterally, deformity of fingers Neuro: Cranial nerves II-XII intact, no focal neurological deficits   The results of significant diagnostics from this hospitalization (including imaging, microbiology, ancillary and laboratory) are listed below for reference.    LAB RESULTS: Basic Metabolic Panel:  Recent Labs Lab 05/13/12 1408 05/13/12 2126 05/14/12 0545  NA 135  --  136  K 4.2  --  3.8  CL 102  --  102  CO2 21  --  24  GLUCOSE 89  --  118*  BUN 16  --  14  CREATININE 0.63 0.67 0.67  CALCIUM 9.5  --  8.8   Liver Function Tests:  Recent Labs Lab 05/13/12 1408 05/14/12 0545  AST 24 17  ALT 14 11  ALKPHOS 64 57  BILITOT 0.6 0.5  PROT 6.7 5.9*  ALBUMIN 3.2* 2.7*    Recent Labs Lab 05/13/12 1408  LIPASE 34   No results found for this basename: AMMONIA,  in the last 168 hours CBC:  Recent Labs Lab 05/13/12 1408 05/13/12 2126  WBC 6.9 6.0  NEUTROABS 4.8 4.2  HGB 12.5* 11.6*  HCT 36.6* 34.2*  MCV 83.9 83.6  PLT 264 262   Cardiac Enzymes:  Recent Labs Lab 05/13/12 2126 05/14/12 1910  CKTOTAL  --  34  TROPONINI <0.30  --     Significant Diagnostic Studies:   Ct Angio Abd/pel W/ And/or W/o  05/13/2012  *RADIOLOGY REPORT*  Clinical Data:  Unexplained chest pain and abdominal pain.  CT ANGIOGRAPHY CHEST, ABDOMEN AND PELVIS  Technique:  Multidetector CT imaging through the chest, abdomen and pelvis was performed using the standard protocol during bolus administration of intravenous contrast.  Multiplanar reconstructed images including MIPs were obtained and reviewed to evaluate the vascular  anatomy.  Contrast: OMNIPAQUE IOHEXOL 350 MG/ML.  Comparison:   None.  CTA CHEST  Findings:  Unenhanced images demonstrate no evidence of mural hematoma.  Mild calcified plaque in the aortic arch and descending thoracic aorta.  Extensive three-vessel coronary atherosclerotic calcification.  Enhanced images demonstrate no evidence of thoracic aortic aneurysm or dissection.  Central pulmonary arteries widely patent.  Heart moderately enlarged with left ventricular predominance.  No pericardial effusion.  Proximal great vessels mildly atherosclerotic though widely patent.  Airspace consolidation with air bronchograms in the left lower lobe and minimally in the lingula.  Associated small left pleural effusion.  Emphysematous changes throughout both lungs.  Dependent atelectasis posteriorly in the right lower lobe.  Lungs otherwise clear.  No evidence of interstitial lung disease.  No right pleural effusion.  No significant mediastinal, hilar, or axillary lymphadenopathy. Visualized thyroid gland  with ACE sub-centimeter nodule in the lower pole of the right lobe.  Bone window images demonstrate lower thoracic spondylosis and a hemangioma in the T10 vertebral body.   Review of the MIP images confirms the above findings.  IMPRESSION:  1.  No evidence of thoracic aortic aneurysm or dissection. 2.  Pneumonia involving the left lower lobe and lingula. Associated small left parapneumonic effusion. 3.  Cardiomegaly with left ventricular predominance.  Extensive three-vessel coronary atherosclerosis. 4.  COPD/emphysema.    CTA ABDOMEN AND PELVIS  Findings:  No evidence of abdominal aortic aneurysm or dissection. Mild atherosclerosis involving the distal abdominal aorta. Atherosclerosis at the origin of the celiac artery, SMA, and the left renal artery, without significant stenoses.  IMA patent. Iliofemoral arterial systems widely patent bilaterally with mild atherosclerosis.  Very small simple cysts in the medial segment  left lobe anterior segment right lobe of liver; no significant focal hepatic parenchymal abnormalities.  Hyperdense round lesion in the posterior mid spleen likely a small hemangioma; no significant abnormalities involving the spleen, allowing for the early arterial phase enhancement.  Normal-appearing pancreas, right adrenal gland, and gallbladder.  Approximate 1.2 cm diameter nodule involving the left adrenal gland.  No biliary ductal dilation.  Numerous very small bilateral renal calculi without evidence of obstruction, the largest stone approximating 4 mm in a lower pole calix of the right kidney.  Approximate 3.5 cm simple cyst arising from the mid left kidney.  No significant focal parenchymal abnormality involving either kidney.  Very small hiatal hernia.  Stomach decompressed otherwise unremarkable.  Normal-appearing small bowel.  Scattered sigmoid colon diverticula without evidence of acute diverticulitis; remainder of the colon unremarkable.  Appendix surgically absent. No ascites.  Mild median lobe prostate gland enlargement consistent with age. Normal seminal vesicles.  Urinary bladder decompressed and unremarkable.  Numerous pelvic phleboliths.  Bone window images demonstrate lower thoracic spondylosis, degenerative disc disease at L5-S1, facet degenerative changes involving the lumbar spine, degenerative changes in the sacroiliac joints, and degenerative changes in the hips.   Review of the MIP images confirms the above findings.  IMPRESSION:  1.  No evidence of abdominal aortic aneurysm or dissection. 2.  Numerous small nonobstructing bilateral renal calculi. 3.  1.2 cm diameter left adrenal nodule, statistically consistent with a small adenoma. 4.  Very small hiatal hernia. 5.  Mild median lobe prostate gland enlargement. 6.  Sigmoid colon diverticulosis without evidence of acute diverticulitis.   Original Report Authenticated By: Hulan Saas, M.D.     2D ECHO: 05/14/12 Study Conclusions  -  Left ventricle: The cavity size was normal. Wall thickness was normal. Systolic function was normal. The estimated ejection fraction was in the range of 55% to 60%. Wall motion was normal; there were no regional wall motion abnormalities. Left ventricular diastolic function parameters were normal. - Mitral valve: Calcified annulus.    Disposition and Follow-up:     Discharge Orders   Future Appointments Provider Department Dept Phone   06/03/2013 8:30 AM [facility] [phone]   Eat a light meal the night before the exam but please avoid gaseous foods.   Nothing to eat or drink for at least 8 hours prior to the exam. No gum chewing or smoking the morning of the exam. Please take your morning medications with small sips of water, especially blood pressure medication. If you have several vascular lab exams and will see physician, please bring a snack with you.   06/03/2013 9:00 AM  Evern Bio, NP [facility] [phone]   Future Orders Complete By Expires     Diet - low sodium heart healthy  As directed     Discharge instructions  As directed     Comments:      Appointment with Dr Corliss Skains (rheumatology) made for you on April 22nd at 10:45am. Please call their office asap if you have to reschedule the appointment.    Increase activity slowly  As directed         DISPOSITION: Home  DIET: Heart healthy diet  ACTIVITY: As tolerated  TESTS THAT NEED FOLLOW-UP Autoimmune workup ordered during this hospitalization as mentioned in the discharge summary  DISCHARGE FOLLOW-UP Follow-up Information   Follow up with Susy Frizzle, MD On 06/18/2012. (at 10:45am. Please arrive to the office at 10:30am for registration)    Contact information:   [address] [place] [address] [phone]       Follow up with Tyler Copas, MD. Schedule an appointment as soon as possible for a visit  in 2 weeks. (for hospital follow-up)    Contact information:   [address] [place] [address] [phone]       Time spent on Discharge: 40 mins  Signed:   Jamaurie Bernier M.D. [facility] 05/15/2012, 12:22 PM Pager: [phone]

## 2012-05-18 ENCOUNTER — Emergency Department (HOSPITAL_COMMUNITY): Payer: Medicare Other

## 2012-05-18 ENCOUNTER — Inpatient Hospital Stay (HOSPITAL_COMMUNITY)
Admission: EM | Admit: 2012-05-18 | Discharge: 2012-05-24 | DRG: 186 | Disposition: A | Discharge status: Home / Self Care | Payer: Medicare Other | Attending: Internal Medicine | Admitting: Internal Medicine

## 2012-05-18 ENCOUNTER — Encounter (HOSPITAL_COMMUNITY): Payer: Self-pay | Admitting: *Deleted

## 2012-05-18 DIAGNOSIS — I714 Abdominal aortic aneurysm, without rupture, unspecified: Secondary | ICD-10-CM | POA: Diagnosis present

## 2012-05-18 DIAGNOSIS — R1011 Right upper quadrant pain: Secondary | ICD-10-CM | POA: Diagnosis present

## 2012-05-18 DIAGNOSIS — R071 Chest pain on breathing: Secondary | ICD-10-CM | POA: Diagnosis present

## 2012-05-18 DIAGNOSIS — K2270 Barrett's esophagus without dysplasia: Secondary | ICD-10-CM | POA: Diagnosis present

## 2012-05-18 DIAGNOSIS — J90 Pleural effusion, not elsewhere classified: Secondary | ICD-10-CM | POA: Diagnosis not present

## 2012-05-18 DIAGNOSIS — J189 Pneumonia, unspecified organism: Secondary | ICD-10-CM | POA: Diagnosis not present

## 2012-05-18 DIAGNOSIS — K219 Gastro-esophageal reflux disease without esophagitis: Secondary | ICD-10-CM | POA: Diagnosis present

## 2012-05-18 DIAGNOSIS — I2510 Atherosclerotic heart disease of native coronary artery without angina pectoris: Secondary | ICD-10-CM | POA: Diagnosis not present

## 2012-05-18 DIAGNOSIS — R0602 Shortness of breath: Secondary | ICD-10-CM | POA: Diagnosis not present

## 2012-05-18 DIAGNOSIS — M129 Arthropathy, unspecified: Secondary | ICD-10-CM | POA: Diagnosis present

## 2012-05-18 DIAGNOSIS — Z79899 Other long term (current) drug therapy: Secondary | ICD-10-CM | POA: Diagnosis not present

## 2012-05-18 DIAGNOSIS — Y95 Nosocomial condition: Secondary | ICD-10-CM | POA: Diagnosis present

## 2012-05-18 DIAGNOSIS — N281 Cyst of kidney, acquired: Secondary | ICD-10-CM | POA: Diagnosis not present

## 2012-05-18 DIAGNOSIS — I517 Cardiomegaly: Secondary | ICD-10-CM

## 2012-05-18 DIAGNOSIS — J9819 Other pulmonary collapse: Secondary | ICD-10-CM | POA: Diagnosis not present

## 2012-05-18 DIAGNOSIS — E785 Hyperlipidemia, unspecified: Secondary | ICD-10-CM | POA: Diagnosis present

## 2012-05-18 DIAGNOSIS — B370 Candidal stomatitis: Secondary | ICD-10-CM | POA: Diagnosis not present

## 2012-05-18 DIAGNOSIS — R079 Chest pain, unspecified: Secondary | ICD-10-CM | POA: Diagnosis not present

## 2012-05-18 DIAGNOSIS — R509 Fever, unspecified: Secondary | ICD-10-CM | POA: Diagnosis present

## 2012-05-18 DIAGNOSIS — Z9989 Dependence on other enabling machines and devices: Secondary | ICD-10-CM

## 2012-05-18 DIAGNOSIS — I10 Essential (primary) hypertension: Secondary | ICD-10-CM | POA: Diagnosis present

## 2012-05-18 DIAGNOSIS — G4730 Sleep apnea, unspecified: Secondary | ICD-10-CM | POA: Diagnosis present

## 2012-05-18 DIAGNOSIS — I152 Hypertension secondary to endocrine disorders: Secondary | ICD-10-CM | POA: Diagnosis present

## 2012-05-18 DIAGNOSIS — D649 Anemia, unspecified: Secondary | ICD-10-CM | POA: Diagnosis present

## 2012-05-18 DIAGNOSIS — G4733 Obstructive sleep apnea (adult) (pediatric): Secondary | ICD-10-CM

## 2012-05-18 LAB — CBC
HCT: 35.3 % — ABNORMAL LOW (ref 39.0–52.0)
Hemoglobin: 12.3 g/dL — ABNORMAL LOW (ref 13.0–17.0)
MCV: 82.1 fL (ref 78.0–100.0)
RDW: 12.4 % (ref 11.5–15.5)
WBC: 9 10*3/uL (ref 4.0–10.5)

## 2012-05-18 LAB — BASIC METABOLIC PANEL
BUN: 19 mg/dL (ref 6–23)
CO2: 21 mEq/L (ref 19–32)
Chloride: 101 mEq/L (ref 96–112)
Creatinine, Ser: 0.75 mg/dL (ref 0.50–1.35)
Glucose, Bld: 173 mg/dL — ABNORMAL HIGH (ref 70–99)
Potassium: 4.2 mEq/L (ref 3.5–5.1)

## 2012-05-18 NOTE — ED Notes (Addendum)
Pt reports increased weakness x 2 days with lack of appetite and fatigue. Denies dizziness, blurred vision. Neuro intact.

## 2012-05-18 NOTE — ED Provider Notes (Addendum)
History     CSN: [id number]  Arrival date & time 05/18/12  2113   First MD Initiated Contact with Patient 05/18/12 2305      Chief Complaint  Patient presents with  . Shortness of Breath     Patient is a 78 y.o. male presenting with shortness of breath. The history is provided by the patient and a relative.  Shortness of Breath Severity:  Moderate Onset quality:  Gradual Timing:  Intermittent Progression:  Worsening Chronicity:  Recurrent Context: activity   Relieved by:  Rest Worsened by:  Activity Associated symptoms: fever   Associated symptoms: no abdominal pain, no chest pain, no cough, no syncope and no vomiting   pt with recent hospitalization for pneumonia.  He felt well at discharge, but in the past 2 days he has had increased SOB and fever at home per family.  Family reports he was short of breath at home.  He reports difficulty ambulating due to SOB No syncope No cp reported  Past Medical History  Diagnosis Date  . Kidney stones   . Hyperlipidemia   . GERD (gastroesophageal reflux disease)   . Carpal tunnel syndrome, bilateral   . Sleep apnea     uses cpap every night  . AAA (abdominal aortic aneurysm)     resolved by ct scan 09-2011  . Arthritis   . Anemia   . Barrett's esophagus   . Hypertension   . Pneumonia     Past Surgical History  Procedure Laterality Date  . Appendectomy    . Hernia repair    . Hand surgery      scar repaired lt hand  . Tonsillectomy    . Cystoscopy w/ stone manipulation  2001,2009  . Carpal tunnel release  10/12/2011    Procedure: CARPAL TUNNEL RELEASE;  Surgeon: Wyn Forster., MD;  Location: [facility];  Service: Orthopedics;  Laterality: Left;  . Carpal tunnel release  12/01/2011    Procedure: CARPAL TUNNEL RELEASE;  Surgeon: Wyn Forster., MD;  Location: [facility];  Service: Orthopedics;  Laterality: Right;    Family History  Problem Relation Age of Onset  . Heart disease  Father   . Hyperlipidemia Father   . Hypertension Father   . Other Father     VARICOSE VEIN  . Peripheral vascular disease Father     History  Substance Use Topics  . Smoking status: Never Smoker   . Smokeless tobacco: Never Used  . Alcohol Use: No      Review of Systems  Constitutional: Positive for fever.  Respiratory: Positive for shortness of breath. Negative for cough.   Cardiovascular: Negative for chest pain and syncope.  Gastrointestinal: Negative for vomiting and abdominal pain.  All other systems reviewed and are negative.    Allergies  Lipitor  Home Medications   Current Outpatient Rx  Name  Route  Sig  Dispense  Refill  . esomeprazole (NEXIUM) 40 MG capsule   Oral   Take 40 mg by mouth daily before breakfast.         . Eyelid Cleansers (OCUSOFT LID SCRUB EX)   Apply externally   Apply 1 application topically daily.         . fenofibrate (TRICOR) 145 MG tablet   Oral   Take 145 mg by mouth daily.         Marland Kitchen levofloxacin (LEVAQUIN) 750 MG tablet   Oral   Take 1 tablet (750  mg total) by mouth daily. X 1 week   7 tablet   0   . meloxicam (MOBIC) 7.5 MG tablet   Oral   Take 7.5 mg by mouth daily.         . Multiple Vitamin (MULTIVITAMIN) tablet   Oral   Take 1 tablet by mouth daily.         . rosuvastatin (CRESTOR) 5 MG tablet   Oral   Take 5 mg by mouth daily.           BP 138/64  Pulse 110  Temp(Src) 99.7 F (37.6 C)  Resp 16  SpO2 93%  Physical Exam CONSTITUTIONAL: Well developed/well nourished HEAD: Normocephalic/atraumatic EYES: EOMI/PERRL ENMT: Mucous membranes dry NECK: supple no meningeal signs SPINE:entire spine nontender CV: S1/S2 noted, no murmurs/rubs/gallops noted LUNGS: decreased BS noted bilaterally.  No distress noted ABDOMEN: soft, nontender, no rebound or guarding GU:no cva tenderness NEURO: Pt is awake/alert, moves all extremitiesx4 EXTREMITIES: pulses normal, full ROM SKIN: warm, color  normal PSYCH: no abnormalities of mood noted  ED Course  Procedures   Labs Reviewed  CBC - Abnormal; Notable for the following:    Hemoglobin 12.3 (*)    HCT 35.3 (*)    All other components within normal limits  BASIC METABOLIC PANEL - Abnormal; Notable for the following:    Glucose, Bld 173 (*)    GFR calc non Af Amer 88 (*)    All other components within normal limits  POCT I-STAT TROPONIN I   Dg Chest 2 View (if Patient Has Fever And/or Copd)  05/18/2012  *RADIOLOGY REPORT*  Clinical Data: Shortness of breath, chest pain  CHEST - 2 VIEW  Comparison: 05/13/2012 CT  Findings: Normal caliber aorta with tortuosity.  Heart size upper normal to mildly enlarged.  Left lung base opacity and small effusion.  No pneumothorax.  No acute osseous finding.  IMPRESSION: Left lung base consolidation and associated effusion; atelectasis versus pneumonia, similar to recent prior.  Heart size upper normal to mildly enlarged.   Original Report Authenticated By: Jearld Lesch, M.D.     11:49 PM Pt with increased SOB and fever at home since recent discharge after pneumonia Will ambulate patient and see if he tolerates this 1:26 AM Pulse ox dropped to 87% per nursing when he ambulated He is now febrile and also symptomatic at home Feel he has failed outpatient levaquin Will admit to medicine D/w dr Adela Glimpse to admit for HCAP given recent admission  MDM  Nursing notes including past medical history and social history reviewed and considered in documentation xrays reviewed and considered Labs/vital reviewed and considered Previous records reviewed and considered - recent admission noted        Date: 05/18/2012  Rate: 108  Rhythm: sinus tachycardia  QRS Axis: left  Intervals: normal  ST/T Wave abnormalities: normal  Conduction Disutrbances:none  Narrative Interpretation:   Old EKG Reviewed: unchanged    Joya Gaskins, MD 05/19/12 0127  2:13 AM Pt began to have CP with deep  breathing and he is diaphoretic EKG unchanged He is stable but his pain has worsened His abdomen is soft on my exam D/w dr Adela Glimpse who has seen patient, will continue with admission   Date: 05/19/2012  Rate: 101  Rhythm: sinus tachycardia  QRS Axis: left  Intervals: normal  ST/T Wave abnormalities: nonspecific ST changes  Conduction Disutrbances:none  Narrative Interpretation:   Old EKG Reviewed: unchanged    Joya Gaskins, MD 05/19/12  0214 

## 2012-05-18 NOTE — ED Notes (Signed)
Pt was admitted on Monday for left lung pneumonia.  Non productive cough, weakness, no appetite.

## 2012-05-18 NOTE — ED Notes (Signed)
Attempted to obtain IV x 2. IV team at bedside. Pt tolerated well.

## 2012-05-19 ENCOUNTER — Encounter (HOSPITAL_COMMUNITY): Payer: Self-pay | Admitting: Internal Medicine

## 2012-05-19 ENCOUNTER — Inpatient Hospital Stay (HOSPITAL_COMMUNITY): Payer: Medicare Other

## 2012-05-19 DIAGNOSIS — R071 Chest pain on breathing: Secondary | ICD-10-CM

## 2012-05-19 DIAGNOSIS — Y95 Nosocomial condition: Secondary | ICD-10-CM | POA: Diagnosis present

## 2012-05-19 DIAGNOSIS — J189 Pneumonia, unspecified organism: Secondary | ICD-10-CM

## 2012-05-19 DIAGNOSIS — R509 Fever, unspecified: Secondary | ICD-10-CM

## 2012-05-19 DIAGNOSIS — R1011 Right upper quadrant pain: Secondary | ICD-10-CM | POA: Diagnosis present

## 2012-05-19 LAB — LIPASE, BLOOD: Lipase: 15 U/L (ref 11–59)

## 2012-05-19 LAB — TROPONIN I
Troponin I: <0.3 ng/mL (ref <0.30)
Troponin I: <0.3 ng/mL (ref <0.30)

## 2012-05-19 LAB — HEPATIC FUNCTION PANEL
AST: 33 U/L (ref 0–37)
Albumin: 2.6 g/dL — ABNORMAL LOW (ref 3.5–5.2)
Alkaline Phosphatase: 69 U/L (ref 39–117)
Total Bilirubin: 0.4 mg/dL (ref 0.3–1.2)

## 2012-05-19 LAB — CBC WITH DIFFERENTIAL/PLATELET
Basophils Absolute: 0 10*3/uL (ref 0.0–0.1)
Eosinophils Absolute: 0 10*3/uL (ref 0.0–0.7)
Eosinophils Relative: 0 % (ref 0–5)
Lymphocytes Relative: 10 % — ABNORMAL LOW (ref 12–46)
Lymphs Abs: 1.1 10*3/uL (ref 0.7–4.0)
MCH: 28.4 pg (ref 26.0–34.0)
MCV: 83 fL (ref 78.0–100.0)
Neutrophils Relative %: 79 % — ABNORMAL HIGH (ref 43–77)
Platelets: 257 10*3/uL (ref 150–400)
RBC: 4.05 MIL/uL — ABNORMAL LOW (ref 4.22–5.81)
RDW: 12.5 % (ref 11.5–15.5)
WBC: 11 10*3/uL — ABNORMAL HIGH (ref 4.0–10.5)

## 2012-05-19 LAB — COMPREHENSIVE METABOLIC PANEL
Alkaline Phosphatase: 68 U/L (ref 39–117)
BUN: 21 mg/dL (ref 6–23)
CO2: 23 mEq/L (ref 19–32)
Chloride: 103 mEq/L (ref 96–112)
Creatinine, Ser: 0.67 mg/dL (ref 0.50–1.35)
GFR calc non Af Amer: >90 mL/min (ref >90)
Potassium: 3.8 mEq/L (ref 3.5–5.1)
Total Bilirubin: 0.5 mg/dL (ref 0.3–1.2)

## 2012-05-19 LAB — URINALYSIS, ROUTINE W REFLEX MICROSCOPIC
Glucose, UA: NEGATIVE mg/dL
Leukocytes, UA: NEGATIVE
Protein, ur: 30 mg/dL — AB
Specific Gravity, Urine: 1.029 (ref 1.005–1.030)
pH: 5 (ref 5.0–8.0)

## 2012-05-19 LAB — URINE MICROSCOPIC-ADD ON

## 2012-05-19 LAB — POCT I-STAT TROPONIN I

## 2012-05-19 LAB — PROCALCITONIN: Procalcitonin: 0.24 ng/mL

## 2012-05-19 MED ORDER — ATORVASTATIN CALCIUM 10 MG PO TABS: 10.0000 mg | ORAL_TABLET | Freq: Every day | ORAL | Status: DC

## 2012-05-19 MED ORDER — DEXTROSE 5 % IV SOLN
1.0000 g | Freq: Three times a day (TID) | INTRAVENOUS | Status: DC
  Filled 2012-05-19: qty 1

## 2012-05-19 MED ORDER — LEVOFLOXACIN IN D5W 750 MG/150ML IV SOLN
750.0000 mg | INTRAVENOUS | Status: DC
  Administered 2012-05-19: 750 mg via INTRAVENOUS
  Filled 2012-05-19: qty 150

## 2012-05-19 MED ORDER — SODIUM CHLORIDE 0.9 % IV SOLN
INTRAVENOUS | Status: DC
  Administered 2012-05-19 – 2012-05-21 (×3): via INTRAVENOUS

## 2012-05-19 MED ORDER — PANTOPRAZOLE SODIUM 40 MG IV SOLR
40.0000 mg | Freq: Every day | INTRAVENOUS | Status: DC
  Administered 2012-05-19 – 2012-05-21 (×3): 40 mg via INTRAVENOUS
  Filled 2012-05-19 (×4): qty 40

## 2012-05-19 MED ORDER — ACETAMINOPHEN 325 MG PO TABS
650.0000 mg | ORAL_TABLET | Freq: Once | ORAL | Status: AC
  Administered 2012-05-19: 650 mg via ORAL
  Filled 2012-05-19: qty 2

## 2012-05-19 MED ORDER — ENOXAPARIN SODIUM 40 MG/0.4ML ~~LOC~~ SOLN
40.0000 mg | Freq: Every day | SUBCUTANEOUS | Status: DC
  Administered 2012-05-19 – 2012-05-24 (×6): 40 mg via SUBCUTANEOUS
  Filled 2012-05-19 (×6): qty 0.4

## 2012-05-19 MED ORDER — VANCOMYCIN HCL IN DEXTROSE 1-5 GM/200ML-% IV SOLN
1000.0000 mg | Freq: Once | INTRAVENOUS | Status: AC
  Administered 2012-05-19: 1000 mg via INTRAVENOUS
  Filled 2012-05-19: qty 200

## 2012-05-19 MED ORDER — MORPHINE SULFATE 2 MG/ML IJ SOLN
2.0000 mg | Freq: Once | INTRAMUSCULAR | Status: AC
  Administered 2012-05-19: 2 mg via INTRAVENOUS
  Filled 2012-05-19: qty 1

## 2012-05-19 MED ORDER — HYDROCODONE-ACETAMINOPHEN 5-325 MG PO TABS
1.0000 | ORAL_TABLET | ORAL | Status: DC | PRN
  Administered 2012-05-19 – 2012-05-21 (×4): 1 via ORAL
  Filled 2012-05-19 (×4): qty 1

## 2012-05-19 MED ORDER — PIPERACILLIN-TAZOBACTAM 3.375 G IVPB
3.3750 g | Freq: Three times a day (TID) | INTRAVENOUS | Status: DC
  Administered 2012-05-19 – 2012-05-22 (×10): 3.375 g via INTRAVENOUS
  Filled 2012-05-19 (×12): qty 50

## 2012-05-19 MED ORDER — ROSUVASTATIN CALCIUM 5 MG PO TABS
5.0000 mg | ORAL_TABLET | Freq: Every day | ORAL | Status: DC
  Administered 2012-05-19 – 2012-05-23 (×5): 5 mg via ORAL
  Filled 2012-05-19 (×6): qty 1

## 2012-05-19 MED ORDER — MORPHINE SULFATE 4 MG/ML IJ SOLN
4.0000 mg | Freq: Once | INTRAMUSCULAR | Status: AC
  Administered 2012-05-19: 4 mg via INTRAVENOUS
  Filled 2012-05-19: qty 1

## 2012-05-19 MED ORDER — DEXTROSE 5 % IV SOLN
1.0000 g | Freq: Once | INTRAVENOUS | Status: AC
  Administered 2012-05-19: 1 g via INTRAVENOUS
  Filled 2012-05-19: qty 1

## 2012-05-19 MED ORDER — VANCOMYCIN HCL 10 G IV SOLR
1250.0000 mg | Freq: Two times a day (BID) | INTRAVENOUS | Status: DC
  Administered 2012-05-19 – 2012-05-22 (×7): 1250 mg via INTRAVENOUS
  Filled 2012-05-19 (×8): qty 1250

## 2012-05-19 MED ORDER — BIOTENE DRY MOUTH MT LIQD
15.0000 mL | Freq: Two times a day (BID) | OROMUCOSAL | Status: DC
  Administered 2012-05-19 – 2012-05-22 (×7): 15 mL via OROMUCOSAL

## 2012-05-19 NOTE — ED Notes (Addendum)
Pt new onset right sided abdominal pain that moves through right chest and into right jaw. Pt diaphoretic. EDP made aware. EKG captured. Pt remains AO x4. Family at bedside.

## 2012-05-19 NOTE — Progress Notes (Signed)
Pt c/o RUQ pain 10/10.  No pain meds ordered.  Notified Dr. Radonna Ricker, order given for norco 5-325mg  q4 PRN.  Will carry out MD orders and continue to monitor.

## 2012-05-19 NOTE — H&P (Signed)
PCP:  Irving Copas, MD    Chief Complaint:   Cough, fever, Right pleuritic pain   HPI: Tyler Norris is a 78 y.o. male   has a past medical history of Kidney stones; Hyperlipidemia; GERD (gastroesophageal reflux disease); Carpal tunnel syndrome, bilateral; Sleep apnea; AAA (abdominal aortic aneurysm); Arthritis; Anemia; Barrett's esophagus; Hypertension; and Pneumonia.   Presented with  He was just admitied with episode of PNA to [facility] hospital and stayed 2 days he was discharged to home on PO lequaquin but started to have fever and fatigue at home. He presented back to the [facility] ER. In ER he started to have severe right upper quadrant pain worse with in inspiration and right upper quadrant tenderness. He was also noted to have fever up to 103. Denies any nausea or vomiting. LFTs were found to be unremarkable as well as lipase. He had persistent elevated d-dimer but have had a recently negative CTA of chest and abdomen. I discussed this case with cardiology who feels that the small infiltrate on the right could not be completely excluded. Is also evidence of possible gallstones on a CT scan from March 17th Review of Systems:    Pertinent positives include: Fevers, chills, right upper quadrant pain, chest pain,  Constitutional:  No weight loss, night sweats,  fatigue, weight loss  HEENT:  No headaches, Difficulty swallowing,Tooth/dental problems,Sore throat,  No sneezing, itching, ear ache, nasal congestion, post nasal drip,  Cardio-vascular:  No  Orthopnea, PND, anasarca, dizziness, palpitations.no Bilateral lower extremity swelling  GI:  No heartburn, indigestion, nausea, vomiting, diarrhea, change in bowel habits, loss of appetite, melena, blood in stool, hematemesis Resp:  no shortness of breath at rest. No dyspnea on exertion, No excess mucus, no productive cough, No non-productive cough, No coughing up of blood.No change in color of mucus.No wheezing. Skin:  no rash or  lesions. No jaundice GU:  no dysuria, change in color of urine, no urgency or frequency. No straining to urinate.  No flank pain.  Musculoskeletal:  No joint pain or no joint swelling. No decreased range of motion. No back pain.  Psych:  No change in mood or affect. No depression or anxiety. No memory loss.  Neuro: no localizing neurological complaints, no tingling, no weakness, no double vision, no gait abnormality, no slurred speech, no confusion  Otherwise ROS are negative except for above, 10 systems were reviewed  Past Medical History: Past Medical History  Diagnosis Date  . Kidney stones   . Hyperlipidemia   . GERD (gastroesophageal reflux disease)   . Carpal tunnel syndrome, bilateral   . Sleep apnea     uses cpap every night  . AAA (abdominal aortic aneurysm)     resolved by ct scan 09-2011  . Arthritis   . Anemia   . Barrett's esophagus   . Hypertension   . Pneumonia    Past Surgical History  Procedure Laterality Date  . Appendectomy    . Hernia repair    . Hand surgery      scar repaired lt hand  . Tonsillectomy    . Cystoscopy w/ stone manipulation  2001,2009  . Carpal tunnel release  10/12/2011    Procedure: CARPAL TUNNEL RELEASE;  Surgeon: Wyn Forster., MD;  Location: [facility];  Service: Orthopedics;  Laterality: Left;  . Carpal tunnel release  12/01/2011    Procedure: CARPAL TUNNEL RELEASE;  Surgeon: Wyn Forster., MD;  Location: [facility];  Service:  Orthopedics;  Laterality: Right;     Medications: Prior to Admission medications   Medication Sig Start Date End Date Taking? Authorizing Provider  esomeprazole (NEXIUM) 40 MG capsule Take 40 mg by mouth daily before breakfast.   Yes Historical Provider, MD  Eyelid Cleansers (OCUSOFT LID SCRUB EX) Apply 1 application topically daily.   Yes Historical Provider, MD  fenofibrate (TRICOR) 145 MG tablet Take 145 mg by mouth daily.   Yes Historical Provider, MD   levofloxacin (LEVAQUIN) 750 MG tablet Take 1 tablet (750 mg total) by mouth daily. X 1 week 05/15/12  Yes Ripudeep Jenna Luo, MD  meloxicam (MOBIC) 7.5 MG tablet Take 7.5 mg by mouth daily.   Yes Historical Provider, MD  Multiple Vitamin (MULTIVITAMIN) tablet Take 1 tablet by mouth daily.   Yes Historical Provider, MD  rosuvastatin (CRESTOR) 5 MG tablet Take 5 mg by mouth daily.   Yes Historical Provider, MD    Allergies:   Allergies  Allergen Reactions  . Lipitor (Atorvastatin Calcium)     Myalgias    Social History:  Ambulatory   independently   Lives at   home   reports that he has never smoked. He has never used smokeless tobacco. He reports that he does not drink alcohol or use illicit drugs.   Family History: family history includes Heart disease in his father; Hyperlipidemia in his father; Hypertension in his father; Other in his father; and Peripheral vascular disease in his father.    Physical Exam: Patient Vitals for the past 24 hrs:  BP Temp Temp src Pulse Resp SpO2 Height Weight  05/19/12 0100 - - - - - - 5' 8.9" (1.75 m) 95.3 kg (210 lb 1.6 oz)  05/19/12 0046 - 103.6 F (39.8 C) Rectal - - - - -  05/19/12 0000 146/72 mmHg - - 98 27 93 % - -  05/18/12 2330 129/50 mmHg - - 99 32 91 % - -  05/18/12 2119 138/64 mmHg - - 110 16 93 % - -  05/18/12 2117 - 99.7 F (37.6 C) - - - - - -    1. General:  in No Acute distress 2. Psychological: Alert and   Oriented 3. Head/ENT:   Moist  Mucous Membranes                          Head Non traumatic, neck supple                          Normal   Dentition 4. SKIN:  decreased Skin turgor,  Skin clean Dry and intact no rash 5. Heart: Regular rate and rhythm no Murmur, Rub or gallop 6. Lungs:  no wheezes occasional mild crackles   7. Abdomen: Soft, severe right upper quadrant tenderness, Non distended 8. Lower extremities: no clubbing, cyanosis, or edema 9. Neurologically Grossly intact, moving all 4 extremities equally 10.  MSK: Normal range of motion  body mass index is 31.12 kg/(m^2).   Labs on Admission:   Recent Labs  05/18/12 2142  NA 136  K 4.2  CL 101  CO2 21  GLUCOSE 173*  BUN 19  CREATININE 0.75  CALCIUM 9.3   No results found for this basename: AST, ALT, ALKPHOS, BILITOT, PROT, ALBUMIN,  in the last 72 hours No results found for this basename: LIPASE, AMYLASE,  in the last 72 hours  Recent Labs  05/18/12 2142  WBC 9.0  HGB 12.3*  HCT 35.3*  MCV 82.1  PLT 302   No results found for this basename: CKTOTAL, CKMB, CKMBINDEX, TROPONINI,  in the last 72 hours No results found for this basename: TSH, T4TOTAL, FREET3, T3FREE, THYROIDAB,  in the last 72 hours No results found for this basename: VITAMINB12, FOLATE, FERRITIN, TIBC, IRON, RETICCTPCT,  in the last 72 hours No results found for this basename: HGBA1C    Estimated Creatinine Clearance: 90.7 ml/min (by C-G formula based on Cr of 0.75). ABG No results found for this basename: phart, pco2, po2, hco3, tco2, acidbasedef, o2sat     No results found for this basename: DDIMER     Other results:  I have pearsonaly reviewed this: ECG REPORT  Rate: 108  Rhythm: Sinus tachycardia ST&T Change: No ischemic change  UA  3-6 WBC   Cultures:    Component Value Date/Time   SDES URINE, CLEAN CATCH 05/14/2012 0505   SPECREQUEST NONE 05/14/2012 0505   REPTSTATUS 05/15/2012 FINAL 05/14/2012 0505       Radiological Exams on Admission: Dg Chest 2 View (if Patient Has Fever And/or Copd)  05/18/2012  *RADIOLOGY REPORT*  Clinical Data: Shortness of breath, chest pain  CHEST - 2 VIEW  Comparison: 05/13/2012 CT  Findings: Normal caliber aorta with tortuosity.  Heart size upper normal to mildly enlarged.  Left lung base opacity and small effusion.  No pneumothorax.  No acute osseous finding.  IMPRESSION: Left lung base consolidation and associated effusion; atelectasis versus pneumonia, similar to recent prior.  Heart size upper normal to  mildly enlarged.   Original Report Authenticated By: Jearld Lesch, M.D.     Chart has been reviewed  Assessment/Plan  78 year old gentleman with recent admission for community-acquired pneumonia presents back with fever up to 103 and now also right upper quadrant tenderness although unremarkable LFTs  Present on Admission:  . Fever - etiology is unclear at this point but if patient has persistent pneumonia and and will need broad-spectrum antibiotics. He also has now new right upper quadrant tenderness and pain although his LFTs are normal his CT scan were reviewed by radiology and may have showed some gallstones will obtain ultrasound to artifact evaluate this further. Also obtain lactic acid and Procalcitonin. Cover broadly right now the Zosyn and vancomycin  . HTN (hypertension) - we'll continue to monitor carefully watch for any evidence of hypotension  . Chest pain on breathing - patient have had similar presentation is pneumonia a few days ago. At that point was left-sided is hard to say. Chest x-rays he has a mild infiltrate or right is well inadequate and irritated diaphragm. He have had a negative CT of the chest 5 days ago.  Marland Kitchen HAP (hospital-acquired pneumonia) - persistent infiltrate and fever possible hospital-acquired pneumonia now will cover with vancomycin and Zosyn  . RUQ abdominal pain - will obtain right upper quadrant ultrasound cover with Zosyn given fever. If there is evidence of cholecystitis will need surgical consult. Keep n.p.o.    Prophylaxis:Lovenox, Protonix  CODE STATUS: FULL CODE  Other plan as per orders.  I have spent a total of 55 min on this admission  Jacque Byron 05/19/2012, 2:05 AM

## 2012-05-19 NOTE — ED Notes (Signed)
Pt reports 8/10 RUQ/RLQ pain that is "shooting up into my right jaw." Reports that pain comes and goes. Denies N/V. Reports hurts worse when he takes a deep breath. VSS. Family at bedside.

## 2012-05-19 NOTE — Progress Notes (Signed)
ANTIBIOTIC CONSULT NOTE - INITIAL  Pharmacy Consult for vancomycin  Indication: rule out pneumonia  Allergies  Allergen Reactions  . Lipitor (Atorvastatin Calcium)     Myalgias    Patient Measurements: Height: 5' 8.9" (175 cm) Weight: 210 lb 1.6 oz (95.3 kg) IBW/kg (Calculated) : 70.47  Vital Signs: Temp: 103.6 F (39.8 C) (03/23 0046) Temp src: Rectal (03/23 0046) BP: 146/72 mmHg (03/23 0000) Pulse Rate: 98 (03/23 0000) Intake/Output from previous day:   Intake/Output from this shift:    Labs:  Recent Labs  05/18/12 2142  WBC 9.0  HGB 12.3*  PLT 302  CREATININE 0.75   Estimated Creatinine Clearance: 90.7 ml/min (by C-G formula based on Cr of 0.75). No results found for this basename: VANCOTROUGH, VANCOPEAK, VANCORANDOM, GENTTROUGH, GENTPEAK, GENTRANDOM, TOBRATROUGH, TOBRAPEAK, TOBRARND, AMIKACINPEAK, AMIKACINTROU, AMIKACIN,  in the last 72 hours   Microbiology: No results found for this or any previous visit (from the past 720 hour(s)).  Medical History: Past Medical History  Diagnosis Date  . Kidney stones   . Hyperlipidemia   . GERD (gastroesophageal reflux disease)   . Carpal tunnel syndrome, bilateral   . Sleep apnea     uses cpap every night  . AAA (abdominal aortic aneurysm)     resolved by ct scan 09-2011  . Arthritis   . Anemia   . Barrett's esophagus   . Hypertension   . Pneumonia     Medications:  Scheduled:  . [COMPLETED] acetaminophen  650 mg Oral Once   Assessment: 78 yo male admitted with r/o pneumonia. Pharmacy consulted to manage vancomycin and adjust antibiotics. Patient with active orders for levofloxacin x 3 days and cefepime 1gm IV x 1.   Goal of Therapy:  Vancomycin trough level 15-20 mcg/ml  Plan:  1. Vancomycin 1gm IV x 1 in ED, then 1250mg  IV Q12H.  2. Cefepime 1gm IV Q8H.   Emeline Gins 05/19/2012,1:30 AM

## 2012-05-20 DIAGNOSIS — J189 Pneumonia, unspecified organism: Secondary | ICD-10-CM

## 2012-05-20 DIAGNOSIS — R1011 Right upper quadrant pain: Secondary | ICD-10-CM

## 2012-05-20 DIAGNOSIS — R071 Chest pain on breathing: Secondary | ICD-10-CM

## 2012-05-20 LAB — CBC
HCT: 32.8 % — ABNORMAL LOW (ref 39.0–52.0)
Hemoglobin: 11.3 g/dL — ABNORMAL LOW (ref 13.0–17.0)
MCH: 28.7 pg (ref 26.0–34.0)
MCHC: 34.5 g/dL (ref 30.0–36.0)
RDW: 12.4 % (ref 11.5–15.5)

## 2012-05-20 LAB — URINE CULTURE: Culture: NO GROWTH

## 2012-05-20 LAB — BASIC METABOLIC PANEL
BUN: 17 mg/dL (ref 6–23)
Calcium: 8.7 mg/dL (ref 8.4–10.5)
Creatinine, Ser: 0.7 mg/dL (ref 0.50–1.35)
GFR calc Af Amer: >90 mL/min (ref >90)
GFR calc non Af Amer: >90 mL/min (ref >90)
Glucose, Bld: 116 mg/dL — ABNORMAL HIGH (ref 70–99)

## 2012-05-20 MED ORDER — CYCLOBENZAPRINE HCL 10 MG PO TABS
5.0000 mg | ORAL_TABLET | Freq: Three times a day (TID) | ORAL | Status: DC | PRN
  Administered 2012-05-20 – 2012-05-22 (×5): 5 mg via ORAL
  Filled 2012-05-20 (×2): qty 1
  Filled 2012-05-20: qty 0.5
  Filled 2012-05-20 (×2): qty 1
  Filled 2012-05-20: qty 2
  Filled 2012-05-20: qty 0.5

## 2012-05-20 MED ORDER — MORPHINE SULFATE 4 MG/ML IJ SOLN
4.0000 mg | INTRAMUSCULAR | Status: DC | PRN
  Administered 2012-05-20: 4 mg via INTRAVENOUS
  Filled 2012-05-20: qty 1

## 2012-05-20 NOTE — Progress Notes (Signed)
[facility] PROGRESS NOTE Assessment/Plan: HAP (hospital-acquired pneumonia)/Fever: - Vanc. and zosyn 3.23.2014. - afebrile. - cultures negative till date   RUQ abdominal pain - [place] 3.23.2014 no gall stone or sign of cholecystitis.  HTN (hypertension) - BP high, Pt is no BP medication at home.  Chest pain on breathing: - CM negative  x3  2/2 to #1.  Code Status: full Family Communication: none Disposition Plan: home 2-3 days.  Consultants:  none  Procedures:  CT angio 3.23.2014 : negative for PE: negative for PE.  [place] 3.23.2014: negative stone.  Echo 3.23.2014: ejection fraction was in the range of 55% to 60%. Wallmotion was normal     Antibiotics:  Vanc and zosyn 3.23.201 (indicate start date, and stop date if known)  HPI/Subjective: Spasm in RUQ  Objective: Filed Vitals:   05/19/12 0629 05/19/12 1404 05/19/12 2020 05/20/12 0452  BP: 144/81 142/82 152/87 148/81  Pulse: 87 79 89 91  Temp: 97.4 F (36.3 C) 97.6 F (36.4 C) 98.7 F (37.1 C) 97.9 F (36.6 C)  TempSrc: Oral Oral Oral Oral  Resp: 22 20 20 20   Height:      Weight: 92 kg (202 lb 13.2 oz)   92.7 kg (204 lb 5.9 oz)  SpO2: 97% 95% 98% 98%    Intake/Output Summary (Last 24 hours) at 05/20/12 8295 Last data filed at 05/20/12 0600  Gross per 24 hour  Intake 2888.33 ml  Output    700 ml  Net 2188.33 ml   Filed Weights   05/19/12 0100 05/19/12 0629 05/20/12 0452  Weight: 95.3 kg (210 lb 1.6 oz) 92 kg (202 lb 13.2 oz) 92.7 kg (204 lb 5.9 oz)    Exam:  General: Alert, awake, oriented x3, in no acute distress.  HEENT: No bruits, no goiter.  Heart: Regular rate and rhythm, without murmurs, rubs, gallops.  Lungs: moderate air movement, crackles at left  Abdomen: Soft, RUQ tenderness, no rebound Neuro: Grossly intact, nonfocal.   Data Reviewed: Basic Metabolic Panel:  Recent Labs Lab 05/13/12 1408 05/13/12 2126 05/14/12 0545 05/18/12 2142 05/19/12 0630  NA 135  --  136 136  138  K 4.2  --  3.8 4.2 3.8  CL 102  --  102 101 103  CO2 21  --  24 21 23   GLUCOSE 89  --  118* 173* 133*  BUN 16  --  14 19 21   CREATININE 0.63 0.67 0.67 0.75 0.67  CALCIUM 9.5  --  8.8 9.3 9.0   Liver Function Tests:  Recent Labs Lab 05/13/12 1408 05/14/12 0545 05/19/12 0242 05/19/12 0630  AST 24 17 33 28  ALT 14 11 29 25   ALKPHOS 64 57 69 68  BILITOT 0.6 0.5 0.4 0.5  PROT 6.7 5.9* 6.0 6.0  ALBUMIN 3.2* 2.7* 2.6* 2.4*    Recent Labs Lab 05/13/12 1408 05/19/12 0242  LIPASE 34 15   No results found for this basename: AMMONIA,  in the last 168 hours CBC:  Recent Labs Lab 05/13/12 1408 05/13/12 2126 05/18/12 2142 05/19/12 0630 05/20/12 0625  WBC 6.9 6.0 9.0 11.0* 8.5  NEUTROABS 4.8 4.2  --  8.7*  --   HGB 12.5* 11.6* 12.3* 11.5* 11.3*  HCT 36.6* 34.2* 35.3* 33.6* 32.8*  MCV 83.9 83.6 82.1 83.0 83.2  PLT 264 262 302 257 255   Cardiac Enzymes:  Recent Labs Lab 05/13/12 2126 05/14/12 1910 05/19/12 0956 05/19/12 1537 05/19/12 2250  CKTOTAL  --  34  --   --   --  TROPONINI <0.30  --  <0.30 <0.30 <0.30   BNP (last 3 results) No results found for this basename: PROBNP,  in the last 8760 hours CBG: No results found for this basename: GLUCAP,  in the last 168 hours  No results found for this or any previous visit (from the past 240 hour(s)).   Studies: Dg Chest 2 View (if Patient Has Fever And/or Copd)  05/18/2012  *RADIOLOGY REPORT*  Clinical Data: Shortness of breath, chest pain  CHEST - 2 VIEW  Comparison: 05/13/2012 CT  Findings: Normal caliber aorta with tortuosity.  Heart size upper normal to mildly enlarged.  Left lung base opacity and small effusion.  No pneumothorax.  No acute osseous finding.  IMPRESSION: Left lung base consolidation and associated effusion; atelectasis versus pneumonia, similar to recent prior.  Heart size upper normal to mildly enlarged.   Original Report Authenticated By: Jearld Lesch, M.D.    US Abdomen  Complete  05/19/2012  *RADIOLOGY REPORT*  Clinical Data:  Nephrolithiasis, abdominal pain.  COMPLETE ABDOMINAL ULTRASOUND  Comparison:  CT 05/13/2012  Findings:  Gallbladder:  No shadowing gallstones or echogenic sludge.  No gallbladder wall thickening or pericholecystic fluid.  Negative sonographic Murphy's sign according to the ultrasound technologist.  Common bile duct:  6 mm diameter, unremarkable  Liver:  No focal lesion identified.  Within normal limits in parenchymal echogenicity.  IVC:  Appears normal.  Pancreas:  No focal abnormality seen.  Spleen:  7.4 cm craniocaudal length, unremarkable.  Right Kidney:  12.4 cm. No hydronephrosis.  Well-preserved cortex. Normal size and parenchymal echotexture without focal abnormalities.  Left Kidney:  14.3 cm.  25 x 29 x 31 mm mid pole cyst.  No solid renal lesion or hydronephrosis.  Abdominal aorta:  No aneurysm identified.  IMPRESSION:  1.  Normal gallbladder. 2.  Left renal cyst.   Original Report Authenticated By: D. Andria Rhein, MD     Scheduled Meds: . antiseptic oral rinse  15 mL Mouth Rinse BID  . enoxaparin (LOVENOX) injection  40 mg Subcutaneous Daily  . pantoprazole (PROTONIX) IV  40 mg Intravenous QHS  . piperacillin-tazobactam (ZOSYN)  IV  3.375 g Intravenous Q8H  . rosuvastatin  5 mg Oral q1800  . vancomycin  1,250 mg Intravenous Q12H   Continuous Infusions: . sodium chloride 100 mL/hr at 05/19/12 0535     Marinda Elk  [facility] Pager [phone]. If 8PM-8AM, please contact night-coverage at [email or web address], password [facility] 05/20/2012, 7:28 AM  LOS: 2 days

## 2012-05-20 NOTE — Progress Notes (Signed)
Utilization Review Completed.   Gwynn Chalker, RN, BSN Nurse Case Manager  [phone]  

## 2012-05-21 DIAGNOSIS — I10 Essential (primary) hypertension: Secondary | ICD-10-CM

## 2012-05-21 LAB — PROCALCITONIN: Procalcitonin: 0.15 ng/mL

## 2012-05-21 MED ORDER — ACETAMINOPHEN 325 MG PO TABS
650.0000 mg | ORAL_TABLET | ORAL | Status: DC | PRN
  Administered 2012-05-21 – 2012-05-22 (×2): 650 mg via ORAL
  Filled 2012-05-21 (×2): qty 2

## 2012-05-21 NOTE — Clinical Documentation Improvement (Signed)
SEPSIS DOCUMENTATION QUERY   CLINICAL DOCUMENTATION QUERIES ARE NOT PART OF THE PERMANENT MEDICAL RECORD  Please update your documentation within the medical record to reflect your response to this query.                                                                                     05/21/12  Dr. Lavera Guise and/or Associates,  In a better effort to capture your patient's severity of illness, reflect appropriate length of stay and utilization of resources, a review of the patient medical record has revealed the following indicators:   - Admitted for Pneumonia   - Temp in ED 103.6 rectal in ED   - Pulse 98 to 100 in ED   - Respirations 25 to 30's in ED    Based on your clinical judgment, please document in the progress notes and discharge summary if a condition below provides greater specificity regarding the patient's presenting symptoms:   - Sepsis, Present on Admission, Resolved   - SIRS, Present on Admission, Resolved   - Other Condition   - Unable to Clinically Determine   In responding to this query please exercise your independent judgment.    The fact that a query is asked, does not imply that any particular answer is desired or expected.    Reviewed: additional documentation in the medical record  Thank You,  Jerral Ralph  RN BSN CCDS Certified Clinical Documentation Specialist: Cell   [phone]  Health Information Management [place]   TO RESPOND TO THE THIS QUERY, FOLLOW THE INSTRUCTIONS BELOW:  1. If needed, update documentation for the patient's encounter via the notes activity.  2. Access this query again and click edit on the In [employer].  3. After updating, or not, click F2 to complete all highlighted (required) fields concerning your review. Select "additional documentation in the medical record" OR "no additional documentation provided".  4. Click Sign note button.  5. The deficiency will fall out of your In Basket *Please let us  know if you are not able to complete this workflow by phone or e-mail (listed below).

## 2012-05-21 NOTE — Evaluation (Signed)
Physical Therapy Evaluation Patient Details Name: Tyler Norris MRN: [id number] DOB: 1934/09/25 Today's Date: 05/21/2012 Time: 6578-4696 PT Time Calculation (min): 20 min  PT Assessment / Plan / Recommendation Clinical Impression  Pt readmitted for PNA.  Needs skilled PT to maximize I and safety so pt can return home.  Currently with decr SaO2 with activity even on O2.    PT Assessment  Patient needs continued PT services    Follow Up Recommendations  No PT follow up    Does the patient have the potential to tolerate intense rehabilitation      Barriers to Discharge        Equipment Recommendations  None recommended by PT    Recommendations for Other Services     Frequency Min 3X/week    Precautions / Restrictions Precautions Precautions: Fall   Pertinent Vitals/Pain Pt c/o mild abdominal pain.      Mobility  Transfers Sit to Stand: 4: Min guard;With upper extremity assist;With armrests;From chair/3-in-1 Stand to Sit: 4: Min guard;With upper extremity assist;With armrests;To chair/3-in-1 Details for Transfer Assistance: Assist for balance. Ambulation/Gait Ambulation/Gait Assistance: 4: Min assist Ambulation Distance (Feet): 200 Feet Assistive device: None Ambulation/Gait Assistance Details: Slightly unsteady but no overt LOB. Gait Pattern: Step-through pattern;Decreased stride length    Exercises     PT Diagnosis: Difficulty walking;Generalized weakness  PT Problem List: Decreased strength;Decreased activity tolerance;Decreased balance;Decreased mobility PT Treatment Interventions: DME instruction;Gait training;Patient/family education;Functional mobility training;Therapeutic activities;Therapeutic exercise;Balance training   PT Goals Acute Rehab PT Goals PT Goal Formulation: With patient Time For Goal Achievement: 05/28/12 Potential to Achieve Goals: Good Pt will go Sit to Stand: with modified independence PT Goal: Sit to Stand - Progress: Goal set  today Pt will go Stand to Sit: with modified independence PT Goal: Stand to Sit - Progress: Goal set today Pt will Ambulate: >150 feet;with modified independence;with least restrictive assistive device PT Goal: Ambulate - Progress: Goal set today  Visit Information  Last PT Received On: 05/21/12 Assistance Needed: +1    Subjective Data  Subjective: Pt reports he didn't get to stay home very long. 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   Prior Functioning  Home Living Lives With: Spouse Available Help at Discharge: Available 24 hours/day;Family Type of Home: House Home Access: Level entry Home Layout: One level Bathroom Shower/Tub: Engineer, manufacturing systems: Standard Home Adaptive Equipment: None Prior Function Level of Independence: Independent Able to Take Stairs?: Yes Driving: Yes Vocation: Part time employment Communication Communication: No difficulties    Cognition  Cognition Overall Cognitive Status: Appears within functional limits for tasks assessed/performed Arousal/Alertness: Awake/alert Orientation Level: Oriented X4 / Intact Behavior During Session: WFL for tasks performed    Extremity/Trunk Assessment Right Lower Extremity Assessment RLE ROM/Strength/Tone: Deficits RLE ROM/Strength/Tone Deficits: grossly 4/5 Left Lower Extremity Assessment LLE ROM/Strength/Tone: Deficits LLE ROM/Strength/Tone Deficits: grossly 4/5   Balance Static Standing Balance Static Standing - Balance Support: No upper extremity supported Static Standing - Level of Assistance: 5: Stand by assistance  End of Session PT - End of Session Activity Tolerance: Patient tolerated treatment well Patient left: in chair;with call bell/phone within reach Nurse Communication: Mobility status  GP     [facility] 05/21/2012, 3:42 PM  [facility] PT [phone]

## 2012-05-21 NOTE — Progress Notes (Signed)
Patient Tmax 100.1 Oral.  NP Schorr made aware. Tylenol ordered and given PRN per order. Will continue to monitor. Louretta Parma, RN

## 2012-05-21 NOTE — Progress Notes (Signed)
[facility] PROGRESS NOTE Assessment/Plan: HAP (hospital-acquired pneumonia)/Fever: - Vanc. and zosyn 3.23.2014. Will de-escalate treatment in 24- hrs if improvement continues. - afebrile.  - cultures negative till date   RUQ abdominal pain - [place] 3.23.2014 no gall stone or sign of cholecystitis. - spasm resolved with flexeril. ? Diaphragmatic spasm. - CT angio no evidence of dissection or acute pathology  HTN (hypertension) - BP high, Pt is no BP medication at home.  Chest pain: - pleuritic, resolved. - CM negative  x3  2/2 to #1.  Code Status: full Family Communication: none Disposition Plan: home 1-2 days.  Consultants:  none  Procedures:  CT angio 3.23.2014 : negative for PE: negative for PE.  [place] 3.23.2014: negative stone.  Echo 3.23.2014: ejection fraction was in the range of 55% to 60%. Wallmotion was normal     Antibiotics:  Vanc and zosyn 3.23.201 (indicate start date, and stop date if known)  HPI/Subjective: Spasm in RUQ are now improved. Was able to sleep.  Objective: Filed Vitals:   05/20/12 1840 05/20/12 1959 05/21/12 0508 05/21/12 0926  BP: 149/75 145/76 147/79 143/70  Pulse:  98 100 96  Temp: 99.3 F (37.4 C) 98.9 F (37.2 C) 99.5 F (37.5 C) 99.2 F (37.3 C)  TempSrc: Oral Oral Oral Oral  Resp: 18 18 18 20   Height:      Weight:   91.4 kg (201 lb 8 oz)   SpO2: 95% 95% 94% 96%    Intake/Output Summary (Last 24 hours) at 05/21/12 1257 Last data filed at 05/21/12 0815  Gross per 24 hour  Intake 3866.66 ml  Output    750 ml  Net 3116.66 ml   Filed Weights   05/19/12 0629 05/20/12 0452 05/21/12 0508  Weight: 92 kg (202 lb 13.2 oz) 92.7 kg (204 lb 5.9 oz) 91.4 kg (201 lb 8 oz)    Exam:  General: Alert, awake, oriented x3, in no acute distress.  HEENT: No bruits, no goiter.  Heart: Regular rate and rhythm, without murmurs, rubs, gallops.  Lungs: moderate air movement, crackles at left  Abdomen: Soft, non tender, no  rebound. Neuro: Grossly intact, nonfocal.   Data Reviewed: Basic Metabolic Panel:  Recent Labs Lab 05/18/12 2142 05/19/12 0630 05/20/12 0625  NA 136 138 138  K 4.2 3.8 3.7  CL 101 103 104  CO2 21 23 24   GLUCOSE 173* 133* 116*  BUN 19 21 17   CREATININE 0.75 0.67 0.70  CALCIUM 9.3 9.0 8.7   Liver Function Tests:  Recent Labs Lab 05/19/12 0242 05/19/12 0630  AST 33 28  ALT 29 25  ALKPHOS 69 68  BILITOT 0.4 0.5  PROT 6.0 6.0  ALBUMIN 2.6* 2.4*    Recent Labs Lab 05/19/12 0242  LIPASE 15   No results found for this basename: AMMONIA,  in the last 168 hours CBC:  Recent Labs Lab 05/18/12 2142 05/19/12 0630 05/20/12 0625  WBC 9.0 11.0* 8.5  NEUTROABS  --  8.7*  --   HGB 12.3* 11.5* 11.3*  HCT 35.3* 33.6* 32.8*  MCV 82.1 83.0 83.2  PLT 302 257 255   Cardiac Enzymes:  Recent Labs Lab 05/14/12 1910 05/19/12 0956 05/19/12 1537 05/19/12 2250  CKTOTAL 34  --   --   --   TROPONINI  --  <0.30 <0.30 <0.30   BNP (last 3 results) No results found for this basename: PROBNP,  in the last 8760 hours CBG: No results found for this basename: GLUCAP,  in the  last 168 hours  Recent Results (from the past 240 hour(s))  URINE CULTURE     Status: None   Collection Time    05/19/12 12:40 AM      Result Value Range Status   Specimen Description URINE, CLEAN CATCH   Final   Special Requests ADDED 0111   Final   Culture  Setup Time 05/19/2012 20:35   Final   Colony Count NO GROWTH   Final   Culture NO GROWTH   Final   Report Status 05/20/2012 FINAL   Final  CULTURE, BLOOD (ROUTINE X 2)     Status: None   Collection Time    05/19/12 12:50 AM      Result Value Range Status   Specimen Description BLOOD LEFT ARM   Final   Special Requests BOTTLES DRAWN AEROBIC AND ANAEROBIC 10CC EACH   Final   Culture  Setup Time 05/19/2012 15:46   Final   Culture     Final   Value:        BLOOD CULTURE RECEIVED NO GROWTH TO DATE CULTURE WILL BE HELD FOR 5 DAYS BEFORE ISSUING A  FINAL NEGATIVE REPORT   Report Status PENDING   Incomplete  CULTURE, BLOOD (ROUTINE X 2)     Status: None   Collection Time    05/19/12  1:10 AM      Result Value Range Status   Specimen Description BLOOD LEFT HAND   Final   Special Requests BOTTLES DRAWN AEROBIC AND ANAEROBIC 10CC EACH   Final   Culture  Setup Time 05/19/2012 15:46   Final   Culture     Final   Value:        BLOOD CULTURE RECEIVED NO GROWTH TO DATE CULTURE WILL BE HELD FOR 5 DAYS BEFORE ISSUING A FINAL NEGATIVE REPORT   Report Status PENDING   Incomplete     Studies: No results found.  Scheduled Meds: . antiseptic oral rinse  15 mL Mouth Rinse BID  . enoxaparin (LOVENOX) injection  40 mg Subcutaneous Daily  . pantoprazole (PROTONIX) IV  40 mg Intravenous QHS  . piperacillin-tazobactam (ZOSYN)  IV  3.375 g Intravenous Q8H  . rosuvastatin  5 mg Oral q1800  . vancomycin  1,250 mg Intravenous Q12H   Continuous Infusions: . sodium chloride 100 mL/hr at 05/20/12 1312     FELIZ Rosine Beat  [facility] Pager [phone]. If 8PM-8AM, please contact night-coverage at [email or web address], password [facility] 05/21/2012, 12:57 PM  LOS: 3 days

## 2012-05-22 ENCOUNTER — Inpatient Hospital Stay (HOSPITAL_COMMUNITY): Payer: Medicare Other

## 2012-05-22 DIAGNOSIS — J90 Pleural effusion, not elsewhere classified: Principal | ICD-10-CM

## 2012-05-22 MED ORDER — PANTOPRAZOLE SODIUM 40 MG PO TBEC
40.0000 mg | DELAYED_RELEASE_TABLET | Freq: Every day | ORAL | Status: DC
  Administered 2012-05-22 – 2012-05-23 (×2): 40 mg via ORAL
  Filled 2012-05-22 (×2): qty 1

## 2012-05-22 MED ORDER — FUROSEMIDE 10 MG/ML IJ SOLN
40.0000 mg | Freq: Once | INTRAMUSCULAR | Status: AC
  Administered 2012-05-22: 40 mg via INTRAVENOUS

## 2012-05-22 MED ORDER — MENTHOL 3 MG MT LOZG
1.0000 | LOZENGE | OROMUCOSAL | Status: DC | PRN
  Filled 2012-05-22: qty 9

## 2012-05-22 MED ORDER — NYSTATIN 100000 UNIT/ML MT SUSP
5.0000 mL | Freq: Four times a day (QID) | OROMUCOSAL | Status: DC
  Administered 2012-05-22 – 2012-05-24 (×7): 500000 [IU] via ORAL
  Filled 2012-05-22 (×11): qty 5

## 2012-05-22 NOTE — Progress Notes (Signed)
[facility] PROGRESS NOTE Assessment/Plan:  CAP first diagnosed on 05/13/12 - discharged 3/19 - readmitted 3/22 due to pleuritic chest pain :- LLL ASD with pleural efusion on CXR 3/22  -started on  Vanc. and zosyn 3.23.2014.  Has completed 10 days of antibiotics on 05/22/12. Stopped abx 05/22/12  Left pleural efussion - repeat CT chest 05/22/12   Oral candidiasis - started Nystatin 05/22/12    RUQ abdominal pain - [place] 3.23.2014 no gall stone or sign of cholecystitis. - spasm resolved with flexeril. ? Diaphragmatic spasm. - CT angio no evidence of dissection or acute pathology done on 3/17   HTN (hypertension) - BP high, Pt is no BP medication at home.  Chest pain: - pleuritic, resolved. - CM negative  x3  2/2 to #1.  Code Status: full Family Communication:  Disposition Plan: home 1-2 days.  Consultants:  none  Procedures:  CT angio 3.17.2014 : negative for PE: negative for PE.  [place] 3.23.2014: negative stone.  Echo 3.23.2014: ejection fraction was in the range of 55% to 60%. Wallmotion was normal     Antibiotics:  Vanc and zosyn 3.23.201 - 05/22/12   HPI/Subjective: With cough and left side chest dyscomfort   Objective: Filed Vitals:   05/21/12 1531 05/21/12 2008 05/21/12 2137 05/22/12 0418  BP:  133/64  145/82  Pulse:  99  104  Temp:  100.1 F (37.8 C) 97.9 F (36.6 C) 97.2 F (36.2 C)  TempSrc:  Oral Oral Oral  Resp:  18  18  Height:      Weight:    91.853 kg (202 lb 8 oz)  SpO2: 91% 96%  99%   Patient Vitals for the past 24 hrs:  BP Temp Temp src Pulse Resp SpO2 Weight  05/22/12 0418 145/82 mmHg 97.2 F (36.2 C) Oral 104 18 99 % 91.853 kg (202 lb 8 oz)  05/21/12 2137 - 97.9 F (36.6 C) Oral - - - -  05/21/12 2008 133/64 mmHg 100.1 F (37.8 C) Oral 99 18 96 % -  05/21/12 1531 - - - - - 91 % -  05/21/12 1530 - - - - - 88 % -  05/21/12 1516 - - - - - 94 % -  05/21/12 1407 136/69 mmHg 99.3 F (37.4 C) Oral 100 18 96 % -     Intake/Output  Summary (Last 24 hours) at 05/22/12 1223 Last data filed at 05/22/12 0811  Gross per 24 hour  Intake   2835 ml  Output    875 ml  Net   1960 ml   Filed Weights   05/20/12 0452 05/21/12 0508 05/22/12 0418  Weight: 92.7 kg (204 lb 5.9 oz) 91.4 kg (201 lb 8 oz) 91.853 kg (202 lb 8 oz)    Exam:  General: Alert, awake, oriented x3, in no acute distress.  HEENT: No bruits, no goiter.  Heart: Regular rate and rhythm, without murmurs, rubs, gallops.  Lungs: decreased breath sounds left base.  Abdomen: Soft, non tender, no rebound. Skin without any major rashes    Data Reviewed: Basic Metabolic Panel:  Recent Labs Lab 05/18/12 2142 05/19/12 0630 05/20/12 0625  NA 136 138 138  K 4.2 3.8 3.7  CL 101 103 104  CO2 21 23 24   GLUCOSE 173* 133* 116*  BUN 19 21 17   CREATININE 0.75 0.67 0.70  CALCIUM 9.3 9.0 8.7   Liver Function Tests:  Recent Labs Lab 05/19/12 0242 05/19/12 0630  AST 33 28  ALT 29 25  ALKPHOS 69 68  BILITOT 0.4 0.5  PROT 6.0 6.0  ALBUMIN 2.6* 2.4*    Recent Labs Lab 05/19/12 0242  LIPASE 15   No results found for this basename: AMMONIA,  in the last 168 hours CBC:  Recent Labs Lab 05/18/12 2142 05/19/12 0630 05/20/12 0625  WBC 9.0 11.0* 8.5  NEUTROABS  --  8.7*  --   HGB 12.3* 11.5* 11.3*  HCT 35.3* 33.6* 32.8*  MCV 82.1 83.0 83.2  PLT 302 257 255   Cardiac Enzymes:  Recent Labs Lab 05/19/12 0956 05/19/12 1537 05/19/12 2250  TROPONINI <0.30 <0.30 <0.30   BNP (last 3 results) No results found for this basename: PROBNP,  in the last 8760 hours CBG: No results found for this basename: GLUCAP,  in the last 168 hours  Recent Results (from the past 240 hour(s))  URINE CULTURE     Status: None   Collection Time    05/19/12 12:40 AM      Result Value Range Status   Specimen Description URINE, CLEAN CATCH   Final   Special Requests ADDED 0111   Final   Culture  Setup Time 05/19/2012 20:35   Final   Colony Count NO GROWTH   Final    Culture NO GROWTH   Final   Report Status 05/20/2012 FINAL   Final  CULTURE, BLOOD (ROUTINE X 2)     Status: None   Collection Time    05/19/12 12:50 AM      Result Value Range Status   Specimen Description BLOOD LEFT ARM   Final   Special Requests BOTTLES DRAWN AEROBIC AND ANAEROBIC 10CC EACH   Final   Culture  Setup Time 05/19/2012 15:46   Final   Culture     Final   Value:        BLOOD CULTURE RECEIVED NO GROWTH TO DATE CULTURE WILL BE HELD FOR 5 DAYS BEFORE ISSUING A FINAL NEGATIVE REPORT   Report Status PENDING   Incomplete  CULTURE, BLOOD (ROUTINE X 2)     Status: None   Collection Time    05/19/12  1:10 AM      Result Value Range Status   Specimen Description BLOOD LEFT HAND   Final   Special Requests BOTTLES DRAWN AEROBIC AND ANAEROBIC 10CC EACH   Final   Culture  Setup Time 05/19/2012 15:46   Final   Culture     Final   Value:        BLOOD CULTURE RECEIVED NO GROWTH TO DATE CULTURE WILL BE HELD FOR 5 DAYS BEFORE ISSUING A FINAL NEGATIVE REPORT   Report Status PENDING   Incomplete     Studies: No results found.  Scheduled Meds: . antiseptic oral rinse  15 mL Mouth Rinse BID  . enoxaparin (LOVENOX) injection  40 mg Subcutaneous Daily  . nystatin  5 mL Oral QID  . pantoprazole  40 mg Oral QHS  . rosuvastatin  5 mg Oral q1800   Continuous Infusions:     Aolanis Crispen  [facility] Pager [phone]. If 8PM-8AM, please contact night-coverage at [email or web address], password [facility] 05/22/2012, 12:23 PM  LOS: 4 days

## 2012-05-23 ENCOUNTER — Inpatient Hospital Stay (HOSPITAL_COMMUNITY): Payer: Medicare Other

## 2012-05-23 ENCOUNTER — Other Ambulatory Visit (HOSPITAL_COMMUNITY): Payer: Medicare Other

## 2012-05-23 LAB — BODY FLUID CELL COUNT WITH DIFFERENTIAL
Eos, Fluid: 2 %
Monocyte-Macrophage-Serous Fluid: 66 % (ref 50–90)
Total Nucleated Cell Count, Fluid: 1925 cu mm — ABNORMAL HIGH (ref 0–1000)

## 2012-05-23 LAB — GLUCOSE, SEROUS FLUID: Glucose, Fluid: 80 mg/dL

## 2012-05-23 LAB — PROTIME-INR: Prothrombin Time: 15 seconds (ref 11.6–15.2)

## 2012-05-23 LAB — LACTATE DEHYDROGENASE, PLEURAL OR PERITONEAL FLUID: LD, Fluid: 705 U/L — ABNORMAL HIGH (ref 3–23)

## 2012-05-23 LAB — PROTEIN, BODY FLUID: Total protein, fluid: 3.3 g/dL

## 2012-05-23 MED ORDER — IBUPROFEN 400 MG PO TABS
400.0000 mg | ORAL_TABLET | Freq: Three times a day (TID) | ORAL | Status: DC
  Administered 2012-05-23 – 2012-05-24 (×3): 400 mg via ORAL
  Filled 2012-05-23 (×5): qty 1

## 2012-05-23 NOTE — Progress Notes (Signed)
Pt returned from radiology, s/p rt thoracentesis. VSS, puncture site with small drop blood. Pt denies pain, states breathing is easier.

## 2012-05-23 NOTE — Plan of Care (Signed)
Problem: Phase II Progression Outcomes Goal: Wean O2 if indicated Outcome: Progressing Pt 96% on RA at rest

## 2012-05-23 NOTE — Progress Notes (Signed)
[facility] PROGRESS NOTE Assessment/Plan:  CAP first diagnosed on 05/13/12 - discharged 3/19 - readmitted 3/22 due to pleuritic chest pain :- LLL ASD with pleural efusion on CXR 3/22  -started on  Vanc. and zosyn 3.23.2014.  Has completed 10 days of antibiotics on 05/22/12. Stopped abx 05/22/12  Left pleural efussion - repeat CT chest 05/22/12 - indicating bilateral moderate-sized pleural effusions. Thoracentesis for diagnostic and therapeutic purposes performed 05/23/12  Oral candidiasis - started Nystatin 05/22/12    RUQ abdominal pain - [place] 3.23.2014 no gall stone or sign of cholecystitis. - spasm resolved with flexeril. ? Diaphragmatic spasm. - CT angio no evidence of dissection or acute pathology done on 3/17   HTN (hypertension) -  Chest pain: - pleuritic, resolved. - CM negative  x3  2/2 to #1.  Code Status: full Family Communication: patient  Disposition Plan: home 1-2 days.  Consultants:  none  Procedures:  CT angio 3.17.2014 : negative for PE: negative for PE.  [place] 3.23.2014: negative stone.  Echo 3.23.2014: ejection fraction was in the range of 55% to 60%. Wallmotion was normal  CT chest without contrast 05/22/12  US guided thoracentesis 05/23/12    Antibiotics:  Vanc and zosyn 3.23.201 - 05/22/12   HPI/Subjective: With cough and left side chest dyscomfort   Objective: Filed Vitals:   05/22/12 0418 05/22/12 1306 05/22/12 2035 05/23/12 0502  BP: 145/82 124/64 153/67 157/87  Pulse: 104 97 97 54  Temp: 97.2 F (36.2 C) 98.2 F (36.8 C) 99 F (37.2 C) 98.5 F (36.9 C)  TempSrc: Oral Oral Oral Oral  Resp: 18 18 20 19   Height:      Weight: 91.853 kg (202 lb 8 oz)   91.627 kg (202 lb)  SpO2: 99% 97% 94% 94%   Patient Vitals for the past 24 hrs:  BP Temp Temp src Pulse Resp SpO2 Weight  05/23/12 0502 157/87 mmHg 98.5 F (36.9 C) Oral 54 19 94 % 91.627 kg (202 lb)  05/22/12 2035 153/67 mmHg 99 F (37.2 C) Oral 97 20 94 % -  05/22/12 1306 124/64  mmHg 98.2 F (36.8 C) Oral 97 18 97 % -     Intake/Output Summary (Last 24 hours) at 05/23/12 0944 Last data filed at 05/23/12 0756  Gross per 24 hour  Intake    480 ml  Output    425 ml  Net     55 ml   Filed Weights   05/21/12 0508 05/22/12 0418 05/23/12 0502  Weight: 91.4 kg (201 lb 8 oz) 91.853 kg (202 lb 8 oz) 91.627 kg (202 lb)    Exam:  General: Alert, awake, oriented x3, in no acute distress.  HEENT: No bruits, no goiter.  Heart: Regular rate and rhythm, without murmurs, rubs, gallops.  Lungs: decreased breath sounds left base.  Abdomen: Soft, non tender, no rebound. Skin without any major rashes    Data Reviewed: Basic Metabolic Panel:  Recent Labs Lab 05/18/12 2142 05/19/12 0630 05/20/12 0625  NA 136 138 138  K 4.2 3.8 3.7  CL 101 103 104  CO2 21 23 24   GLUCOSE 173* 133* 116*  BUN 19 21 17   CREATININE 0.75 0.67 0.70  CALCIUM 9.3 9.0 8.7   Liver Function Tests:  Recent Labs Lab 05/19/12 0242 05/19/12 0630  AST 33 28  ALT 29 25  ALKPHOS 69 68  BILITOT 0.4 0.5  PROT 6.0 6.0  ALBUMIN 2.6* 2.4*    Recent Labs Lab 05/19/12 0242  LIPASE  15   No results found for this basename: AMMONIA,  in the last 168 hours CBC:  Recent Labs Lab 05/18/12 2142 05/19/12 0630 05/20/12 0625  WBC 9.0 11.0* 8.5  NEUTROABS  --  8.7*  --   HGB 12.3* 11.5* 11.3*  HCT 35.3* 33.6* 32.8*  MCV 82.1 83.0 83.2  PLT 302 257 255   Cardiac Enzymes:  Recent Labs Lab 05/19/12 0956 05/19/12 1537 05/19/12 2250  TROPONINI <0.30 <0.30 <0.30   BNP (last 3 results) No results found for this basename: PROBNP,  in the last 8760 hours CBG: No results found for this basename: GLUCAP,  in the last 168 hours  Recent Results (from the past 240 hour(s))  URINE CULTURE     Status: None   Collection Time    05/19/12 12:40 AM      Result Value Range Status   Specimen Description URINE, CLEAN CATCH   Final   Special Requests ADDED 0111   Final   Culture  Setup Time  05/19/2012 20:35   Final   Colony Count NO GROWTH   Final   Culture NO GROWTH   Final   Report Status 05/20/2012 FINAL   Final  CULTURE, BLOOD (ROUTINE X 2)     Status: None   Collection Time    05/19/12 12:50 AM      Result Value Range Status   Specimen Description BLOOD LEFT ARM   Final   Special Requests BOTTLES DRAWN AEROBIC AND ANAEROBIC 10CC EACH   Final   Culture  Setup Time 05/19/2012 15:46   Final   Culture     Final   Value:        BLOOD CULTURE RECEIVED NO GROWTH TO DATE CULTURE WILL BE HELD FOR 5 DAYS BEFORE ISSUING A FINAL NEGATIVE REPORT   Report Status PENDING   Incomplete  CULTURE, BLOOD (ROUTINE X 2)     Status: None   Collection Time    05/19/12  1:10 AM      Result Value Range Status   Specimen Description BLOOD LEFT HAND   Final   Special Requests BOTTLES DRAWN AEROBIC AND ANAEROBIC 10CC EACH   Final   Culture  Setup Time 05/19/2012 15:46   Final   Culture     Final   Value:        BLOOD CULTURE RECEIVED NO GROWTH TO DATE CULTURE WILL BE HELD FOR 5 DAYS BEFORE ISSUING A FINAL NEGATIVE REPORT   Report Status PENDING   Incomplete     Studies: Ct Chest Wo Contrast  05/22/2012  *RADIOLOGY REPORT*  Clinical Data: Left-sided pleural effusion.  Shortness of breath.  CT CHEST WITHOUT CONTRAST  Technique:  Multidetector CT imaging of the chest was performed following the standard protocol without IV contrast.  Comparison: Chest CT 05/13/2012 the  Findings:  Mediastinum: Heart size is mildly enlarged. There is no significant pericardial fluid, thickening or pericardial calcification. There is atherosclerosis of the thoracic aorta, the great vessels of the mediastinum and the coronary arteries, including calcified atherosclerotic plaque in the left main, left anterior descending, left circumflex and right coronary arteries. No pathologically enlarged mediastinal or hilar lymph nodes. Please note that accurate exclusion of hilar adenopathy is limited on noncontrast CT scans.   Small hiatal hernia.  Lungs/Pleura: Moderate bilateral pleural effusions layering posteriorly.  Extensive passive atelectasis is noted in the lower lobes of the lungs bilaterally.  There is also a pattern of mild diffuse interlobular septal thickening, suggesting mild  interstitial pulmonary edema.  Upper Abdomen: Unremarkable.  Musculoskeletal: Osteopenia. There are no aggressive appearing lytic or blastic lesions noted in the visualized portions of the skeleton.  IMPRESSION: 1.  Moderate bilateral pleural effusions with extensive passive atelectasis in the lower lobes of the lungs bilaterally. 2.  Mild cardiomegaly with mild interstitial pulmonary edema; imaging findings suggestive of mild congestive heart failure. 3. Atherosclerosis, including left main and three-vessel coronary artery disease.   Assessment for potential risk factor modification, dietary therapy or pharmacologic therapy may be warranted, if clinically indicated. 4.  Small hiatal hernia.   Original Report Authenticated By: Trudie Reed, M.D.     Scheduled Meds: . antiseptic oral rinse  15 mL Mouth Rinse BID  . enoxaparin (LOVENOX) injection  40 mg Subcutaneous Daily  . nystatin  5 mL Oral QID  . pantoprazole  40 mg Oral QHS  . rosuvastatin  5 mg Oral q1800   Continuous Infusions:     Tyler Norris  [facility] Pager [phone]. If 8PM-8AM, please contact night-coverage at [email or web address], password [facility] 05/23/2012, 9:44 AM  LOS: 5 days

## 2012-05-23 NOTE — Clinical Documentation Improvement (Signed)
PNEUMONIA DOCUMENTATION CLARIFICATION QUERY  CLINICAL DOCUMENTATION QUERIES ARE NOT PART OF THE PERMANENT MEDICAL RECORD   Please update your documentation within the medical record to reflect your response to this query.                                                                                     05/23/12  Dr. Lavera Guise and/or Associates,  In a better effort to capture your patient's severity of illness, reflect appropriate length of stay and utilization of resources, a review of the patient medical record has revealed the following indicators:   - Recently treated for pneumonia 05/13/12 - 05/15/12 and discharged home on Levaquin for 1 week   - Readmitted 05/18/12  with fever 103.6, cough, and right pleuritic chest pain   - Treated with Vancomycin and Zosyn this admission for HCAP   Note: CAP, HAP, HCAP indicate WHERE the pneumonia was acquired, not a specific type.  Note:  Probable and Suspected conditions can be coded as if the existed if still documented at the time of discharge.   Based on your clinical judgment, please document in the progress notes and discharge summary if a condition below provides greater specificity regarding the pneumonia treated this admission:   - Bacterial, including Probable or Suspected organism, treated with Vanc and Zosyn   - Other type of Pneumonia   - Unable to Clinically Determine   In responding to this query please exercise your independent judgment.    The fact that a query is asked, does not imply that any particular answer is desired or expected.    Reviewed: additional documentation in the medical record  Thank You,  Jerral Ralph  RN BSN CCDS Certified Clinical Documentation Specialist: Cell   [phone]  Health Information Management [place]   TO RESPOND TO THE THIS QUERY, FOLLOW THE INSTRUCTIONS BELOW:  1. If needed, update documentation for the patient's encounter via the notes activity.  2. Access this  query again and click edit on the [employer].  3. After updating, or not, click F2 to complete all highlighted (required) fields concerning your review. Select "additional documentation in the medical record" OR "no additional documentation provided".  4. Click Sign note button.  5. The deficiency will fall out of your InBasket *Please let us know if you are not able to complete this workflow by phone or e-mail (listed below).

## 2012-05-23 NOTE — Progress Notes (Signed)
05/23/12 1000 Noted pt. to have a thoracentisis today.  Pt. would benefit from [facility] RN to assist with chronic dx management.  Physician, please order [facility] RN and complete face to face for Medicare.  NCM will follow Tera Mater, RN, BSN NCM [phone]

## 2012-05-23 NOTE — Progress Notes (Signed)
Physical Therapy Treatment Patient Details Name: Tyler Norris MRN: [id number] DOB: Feb 02, 1935 Today's Date: 05/23/2012 Time: 4098-1191 PT Time Calculation (min): 28 min  PT Assessment / Plan / Recommendation Comments on Treatment Session  Progressing with tolerance and balance with ambulation.  Still dyspenic.  Going for thoracentesis this morning.  Will continue to benefit from acute level therapies for maximizing independence prior to d/c.    Follow Up Recommendations  No PT follow up           Equipment Recommendations  None recommended by PT       Frequency Min 3X/week   Plan Discharge plan remains appropriate    Precautions / Restrictions Precautions Precautions: Fall   Pertinent Vitals/Pain No pain    Mobility  Bed Mobility Bed Mobility: Not assessed Details for Bed Mobility Assistance: patient up in recliner Transfers Sit to Stand: With armrests;5: Supervision;With upper extremity assist;From chair/3-in-1 Stand to Sit: To chair/3-in-1;With upper extremity assist;With armrests;5: Supervision Details for Transfer Assistance: supervision for safety, initially off balance Ambulation/Gait Ambulation/Gait Assistance: 4: Min guard;5: Supervision Ambulation Distance (Feet): 250 Feet Assistive device: None Ambulation/Gait Assistance Details: increased lateral sway, turns to participate in conversation without LOB, minguard provided at times for checking SpO2 and for turning/guiding back to room Gait Pattern: Decreased stride length;Wide base of support Gait velocity: slow, but distracted due to conversation    Exercises General Exercises - Lower Extremity Ankle Circles/Pumps: AROM;Both;10 reps;Seated Long Arc Quad: AROM;Both;10 reps;Seated Straight Leg Raises: Both;10 reps;Seated;Strengthening Hip Flexion/Marching: Both;10 reps;Standing;Strengthening Heel Raises: Both;10 reps;Standing;Strengthening Mini-Sqauts: Strengthening;10 reps;Standing Other  Exercises Other Exercises: standing balance activities: feet together static 30 sec, with head turns horizontal x 10, ant/post rocking with UE support, cues for ant pelvic tilt    PT Goals Acute Rehab PT Goals Pt will go Sit to Stand: with modified independence PT Goal: Sit to Stand - Progress: Progressing toward goal Pt will go Stand to Sit: with modified independence PT Goal: Stand to Sit - Progress: Progressing toward goal Pt will Ambulate: >150 feet;with modified independence;with least restrictive assistive device PT Goal: Ambulate - Progress: Progressing toward goal  Visit Information  Last PT Received On: 05/23/12    Subjective Data  Subjective: Supposed to go down to get some fluid off my lung this morning   Cognition  Cognition Overall Cognitive Status: Appears within functional limits for tasks assessed/performed Arousal/Alertness: Awake/alert Orientation Level: Oriented X4 / Intact Behavior During Session: [facility] for tasks performed    Balance  Static Standing Balance Static Standing - Balance Support: No upper extremity supported Static Standing - Level of Assistance: 5: Stand by assistance Dynamic Standing Balance Dynamic Standing - Balance Support: Bilateral upper extremity supported Dynamic Standing - Level of Assistance: 4: Min assist Dynamic Standing - Balance Activities: Forward lean/weight shifting  End of Session PT - End of Session Equipment Utilized During Treatment: Gait belt Activity Tolerance: Patient tolerated treatment well Patient left: in chair;with call bell/phone within reach   GP     [facility] 05/23/2012, 11:40 AM Sheran Lawless, PT [phone] 05/23/2012

## 2012-05-23 NOTE — Procedures (Signed)
Ultrasound of bilateral chest reveals bilateral small pleural effusions, with the right side being slightly larger than the left. The left effusion also had consolidated lung adjacent to the pleura, which prevented a safe window for thoracentesis. Therefore, right side was chosen for thoracentesis.  Successful US guided right thoracentesis. Yielded of hazy yellow fluid. Pt tolerated procedure well. No immediate complications.  Specimen was sent for labs. CXR ordered.  Brayton El PA-C 05/23/2012 10:42 AM

## 2012-05-23 NOTE — Progress Notes (Signed)
Pts o2 sat 96% ra. Ambulated entire length of A hall-sat down to 88% ra but recovered quickly while still on ra to 94%

## 2012-05-24 MED ORDER — FLUCONAZOLE 100 MG PO TABS: 100.0000 mg | ORAL_TABLET | Freq: Every day | ORAL | Status: DC

## 2012-05-24 MED ORDER — MENTHOL 3 MG MT LOZG: 1.0000 | LOZENGE | OROMUCOSAL | Status: DC | PRN

## 2012-05-24 MED ORDER — HYDROCOD POLST-CHLORPHEN POLST 10-8 MG/5ML PO LQCR: 5.0000 mL | Freq: Two times a day (BID) | ORAL | Status: DC | PRN

## 2012-05-24 MED ORDER — IBUPROFEN 400 MG PO TABS: 400.0000 mg | ORAL_TABLET | Freq: Three times a day (TID) | ORAL | Status: DC

## 2012-05-24 NOTE — Progress Notes (Signed)
Physical Therapy Treatment/Discharge Summary Patient Details Name: Tyler Norris MRN: [id number] DOB: 12/07/1934 Today's Date: 05/24/2012 Time: 4098-1191 PT Time Calculation (min): 23 min  PT Assessment / Plan / Recommendation Comments on Treatment Session  Patient moving about independently, however remains fall risk due to gait velocity at 1.74 ft/sec (less than 1.8 at risk for recurrent falls,) and dynamic gait index score 19/24 (scores 19 or less indicated fall risk in community living adults.)  Feel patient close to baseline, however and may improve upon return to normal home routine.   No further skilled acute PT needs at this time due to PT goals met.  Encourage hallway ambulation for improved tolerance and balance upon d/c.    Follow Up Recommendations  No PT follow up           Equipment Recommendations  None recommended by PT          Plan Discharge plan remains appropriate;All goals met and education completed, patient dischaged from PT services    Precautions / Restrictions Precautions Precautions: Fall   Pertinent Vitals/Pain Denies pain    Mobility  Bed Mobility Details for Bed Mobility Assistance: patient up in recliner Transfers Sit to Stand: With armrests;With upper extremity assist;From chair/3-in-1;6: Modified independent (Device/Increase time) Stand to Sit: With upper extremity assist;With armrests;6: Modified independent (Device/Increase time);To toilet Details for Transfer Assistance: Moving about room independent per patient Ambulation/Gait Ambulation/Gait Assistance: 6: Modified independent (Device/Increase time) Ambulation Distance (Feet): 300 Feet Assistive device: None Ambulation/Gait Assistance Details: No loss of balance, does demonstrate decreased foot compliance with gait due to collapsed arches Gait Pattern: Decreased stride length;Wide base of support Gait velocity: 1.74 ft/sec Stairs: Yes Stairs Assistance: 5: Supervision Stairs  Assistance Details (indicate cue type and reason): step through to ascend, step to to descend Stair Management Technique: One rail Right;Step to pattern;Alternating pattern Number of Stairs: 5     PT Goals Acute Rehab PT Goals Pt will go Sit to Stand: with modified independence PT Goal: Sit to Stand - Progress: Met Pt will go Stand to Sit: with modified independence PT Goal: Stand to Sit - Progress: Met Pt will Ambulate: >150 feet;with modified independence;with least restrictive assistive device PT Goal: Ambulate - Progress: Met  Visit Information  Last PT Received On: 05/24/12    Subjective Data  Subjective: Want to try to shave.   Cognition  Cognition Overall Cognitive Status: Appears within functional limits for tasks assessed/performed Arousal/Alertness: Awake/alert Orientation Level: Oriented X4 / Intact Behavior During Session: [facility] for tasks performed    Balance  Standardized Balance Assessment Standardized Balance Assessment: Dynamic Gait Index Dynamic Gait Index Level Surface: Mild Impairment Change in Gait Speed: Normal Gait with Horizontal Head Turns: Mild Impairment Gait with Vertical Head Turns: Mild Impairment Gait and Pivot Turn: Normal Step Over Obstacle: Normal Step Around Obstacles: Normal Steps: Moderate Impairment Total Score: 19  End of Session PT - End of Session Equipment Utilized During Treatment: Gait belt Activity Tolerance: Patient tolerated treatment well Patient left:  (in bathroom)   GP     Mckayla Mulcahey,CYNDI 05/24/2012, 12:48 PM Sheran Lawless, PT [phone] 05/24/2012

## 2012-05-24 NOTE — Discharge Summary (Signed)
Physician Discharge Summary  Tyler Norris ZOX:[id number] DOB: 1934-10-16 DOA: 05/18/2012  PCP: Irving Copas, MD  Admit date: 05/18/2012 Discharge date: 05/26/2012  Time spent: 45 minutes  Recommendations for Outpatient Follow-up:  1. Patient will followup with pulmonary medicine to assure resolution of his pleural effusions 2. Patient to followup with rheumatology  Discharge Diagnoses:  Exudative pleural effusions-symptomatic Left lower lobe community-acquired pneumonia Unspecified inflammatory disease with positive ANA, positive double stranded DNA - patient has been referred to rheumatology Dr. Peggye Form    HTN    Discharge Condition: Good  Diet recommendation: Regular  Filed Weights   05/22/12 0418 05/23/12 0502 05/24/12 0645  Weight: 91.853 kg (202 lb 8 oz) 91.627 kg (202 lb) 90.311 kg (199 lb 1.6 oz)    History of present illness:  78 year old patient recently discharged from the hospital presented back with severe right sided chest pain and fevers.   Hospital Course:  1. CAP first diagnosed on 05/13/12 - discharged 3/19 - readmitted 3/22 due to pleuritic chest pain :-Patient had the same LLL ASD on CXR 3/22 . The suggestion is that this is the same infectious process and not a new one. It is not unusual for the radiographic findings to lag behind clinical improvement.  Patient was started on Vanc. and zosyn 3.23.2014 as the admitting physician presumed he has healthcare acquired pneumonia. In retrospect there is absolutely no evidence for such a diagnosis. The patient has completed 10 days of antibiotics on 05/22/12. Stopped abx 05/22/12 and the patient remained afebrile without signs of ongoing infection  Right and Left pleural efussions - repeat CT chest 05/22/12 - indicating bilateral moderate-sized pleural effusions. Thoracentesis for diagnostic and therapeutic purposes performed 05/23/12 on the right side and the pleural fluid analysis was consistent with an  exudative effusion. The fluid was sterile. Most likely etiologies parapneumonic versus related to the unspecified rheumatological condition. There is a very low likelihood for malignancy but I have advised the patient to followup in the pulmonary clinic to assure resolution of his pleural effusion Discharged on nsaids    Oral candidiasis - started Nystatin 05/22/12 with improvement in the symptoms. At the time of discharge the patient has been prescribed one week of Diflucan  RUQ abdominal pain - was never a major issue during this admission  - [place] 3.23.2014 no gallstone or sign of cholecystitis.    Patient with a very complicated rheumatological profile -  Results for Tyler Norris, Tyler Norris (MRN [id number]) as of 05/26/2012 17:20  Ref. Range 05/13/2012 21:26 05/14/2012 19:10  ANA Latest Range: NEGATIVE  POSITIVE (A)   ANA Pattern 1 No range found HOMOGENOUS   ANA Titer 1 Latest Range: <1:40  1:1280 (H)   Cyclic Citrullin Peptide Ab Latest Range: 0.0-5.0 U/mL  <2.0  ds DNA Ab Latest Range: <30 IU/mL  233 (H)  ENA Latest Range: <30 AU/mL  1  Rheumatoid Factor Latest Range: <=14 IU/mL 18 (H)   C3 Complement Latest Range: 90-180 mg/dL  94  Complement C4, Body Fluid Latest Range: 10-40 mg/dL  30   He has been referred to rheumatology for input Appointment with Dr Corliss Skains (rheumatology) made for April 22nd at 10:45am.     Procedures: US guided thoracentesis    Consultations:  None   Discharge Exam: Filed Vitals:   05/23/12 2053 05/24/12 0645 05/24/12 1100 05/24/12 1316  BP: 127/68 144/76  113/62  Pulse: 88 85 118 97  Temp: 98 F (36.7 C) 97.2 F (36.2 C)  97.5 F (36.4  C)  TempSrc: Oral Oral  Oral  Resp: 18 18  18   Height:      Weight:  90.311 kg (199 lb 1.6 oz)    SpO2: 96% 95% 96% 98%    General: axox3 Cardiovascular: rrr Respiratory: ctab   Discharge Instructions      Discharge Orders   Future Appointments Provider Department Dept Phone   06/07/2012 8:45 AM Nyoka Cowden, MD [facility] [phone]   06/03/2013 8:30 AM [facility] [phone]   Eat a light meal the night before the exam but please avoid gaseous foods.   Nothing to eat or drink for at least 8 hours prior to the exam. No gum chewing or smoking the morning of the exam. Please take your morning medications with small sips of water, especially blood pressure medication. If you have several vascular lab exams and will see physician, please bring a snack with you.   06/03/2013 9:00 AM Evern Bio, NP [facility] [phone]   Future Orders Complete By Expires     Diet general  As directed     Increase activity slowly  As directed         Medication List    STOP taking these medications       levofloxacin 750 MG tablet  Commonly known as:  LEVAQUIN     meloxicam 7.5 MG tablet  Commonly known as:  MOBIC      TAKE these medications       chlorpheniramine-HYDROcodone 10-8 MG/5ML Lqcr  Commonly known as:  TUSSIONEX PENNKINETIC ER  Take 5 mLs by mouth every 12 (twelve) hours as needed (cough).     esomeprazole 40 MG capsule  Commonly known as:  NEXIUM  Take 40 mg by mouth daily before breakfast.     fenofibrate 145 MG tablet  Commonly known as:  TRICOR  Take 145 mg by mouth daily.     fluconazole 100 MG tablet  Commonly known as:  DIFLUCAN  Take 1 tablet (100 mg total) by mouth daily.     ibuprofen 400 MG tablet  Commonly known as:  ADVIL,MOTRIN  Take 1 tablet (400 mg total) by mouth 3 (three) times daily.     menthol-cetylpyridinium 3 MG lozenge  Commonly known as:  CEPACOL  Take 1 lozenge (3 mg total) by mouth as needed.     multivitamin tablet  Take 1 tablet by mouth daily.     OCUSOFT LID SCRUB EX  Apply 1 application topically daily.     rosuvastatin 5 MG tablet  Commonly known as:  CRESTOR  Take 5 mg by mouth daily.       Follow-up Information   Schedule an appointment  as soon as possible for a visit with Irving Copas, MD.   Contact information:   [address], [address] [place] [address] [phone]       Follow up with Sandrea Hughs, MD On 06/07/2012. (8:45 AM)    Contact information:   [address] [place] [address] [phone]        The results of significant diagnostics from this hospitalization (including imaging, microbiology, ancillary and laboratory) are listed below for reference.    Significant Diagnostic Studies: Dg Chest 1 View  05/23/2012  *RADIOLOGY REPORT*  Clinical Data: Status post left thoracentesis.  CHEST - 1 VIEW  Comparison: CT chest 05/22/2012. PA and lateral chest 05/18/2012.  Findings: No pneumothorax is  identified.  There are small bilateral pleural effusions and basilar airspace disease, worse on the left. Heart size is upper normal.  IMPRESSION:  1.  Negative for pneumothorax after thoracentesis. 2.  Small bilateral pleural effusions and basilar airspace disease.   Original Report Authenticated By: Holley Dexter, M.D.    Dg Chest 2 View (if Patient Has Fever And/or Copd)  05/18/2012  *RADIOLOGY REPORT*  Clinical Data: Shortness of breath, chest pain  CHEST - 2 VIEW  Comparison: 05/13/2012 CT  Findings: Normal caliber aorta with tortuosity.  Heart size upper normal to mildly enlarged.  Left lung base opacity and small effusion.  No pneumothorax.  No acute osseous finding.  IMPRESSION: Left lung base consolidation and associated effusion; atelectasis versus pneumonia, similar to recent prior.  Heart size upper normal to mildly enlarged.   Original Report Authenticated By: Jearld Lesch, M.D.    Ct Chest Wo Contrast  05/22/2012  *RADIOLOGY REPORT*  Clinical Data: Left-sided pleural effusion.  Shortness of breath.  CT CHEST WITHOUT CONTRAST  Technique:  Multidetector CT imaging of the chest was performed following the standard protocol without IV contrast.  Comparison: Chest CT 05/13/2012 the   Findings:  Mediastinum: Heart size is mildly enlarged. There is no significant pericardial fluid, thickening or pericardial calcification. There is atherosclerosis of the thoracic aorta, the great vessels of the mediastinum and the coronary arteries, including calcified atherosclerotic plaque in the left main, left anterior descending, left circumflex and right coronary arteries. No pathologically enlarged mediastinal or hilar lymph nodes. Please note that accurate exclusion of hilar adenopathy is limited on noncontrast CT scans.  Small hiatal hernia.  Lungs/Pleura: Moderate bilateral pleural effusions layering posteriorly.  Extensive passive atelectasis is noted in the lower lobes of the lungs bilaterally.  There is also a pattern of mild diffuse interlobular septal thickening, suggesting mild interstitial pulmonary edema.  Upper Abdomen: Unremarkable.  Musculoskeletal: Osteopenia. There are no aggressive appearing lytic or blastic lesions noted in the visualized portions of the skeleton.  IMPRESSION: 1.  Moderate bilateral pleural effusions with extensive passive atelectasis in the lower lobes of the lungs bilaterally. 2.  Mild cardiomegaly with mild interstitial pulmonary edema; imaging findings suggestive of mild congestive heart failure. 3. Atherosclerosis, including left main and three-vessel coronary artery disease.   Assessment for potential risk factor modification, dietary therapy or pharmacologic therapy may be warranted, if clinically indicated. 4.  Small hiatal hernia.   Original Report Authenticated By: Trudie Reed, M.D.    US Abdomen Complete  05/19/2012  *RADIOLOGY REPORT*  Clinical Data:  Nephrolithiasis, abdominal pain.  COMPLETE ABDOMINAL ULTRASOUND  Comparison:  CT 05/13/2012  Findings:  Gallbladder:  No shadowing gallstones or echogenic sludge.  No gallbladder wall thickening or pericholecystic fluid.  Negative sonographic Murphy's sign according to the ultrasound technologist.  Common  bile duct:  6 mm diameter, unremarkable  Liver:  No focal lesion identified.  Within normal limits in parenchymal echogenicity.  IVC:  Appears normal.  Pancreas:  No focal abnormality seen.  Spleen:  7.4 cm craniocaudal length, unremarkable.  Right Kidney:  12.4 cm. No hydronephrosis.  Well-preserved cortex. Normal size and parenchymal echotexture without focal abnormalities.  Left Kidney:  14.3 cm.  25 x 29 x 31 mm mid pole cyst.  No solid renal lesion or hydronephrosis.  Abdominal aorta:  No aneurysm identified.  IMPRESSION:  1.  Normal gallbladder. 2.  Left renal cyst.   Original Report Authenticated By: D. Andria Rhein, MD    Ct Angio  Chest Aorta W/cm &/or Wo/cm  05/13/2012  *RADIOLOGY REPORT*  Clinical Data:  Unexplained chest pain and abdominal pain.  CT ANGIOGRAPHY CHEST, ABDOMEN AND PELVIS  Technique:  Multidetector CT imaging through the chest, abdomen and pelvis was performed using the standard protocol during bolus administration of intravenous contrast.  Multiplanar reconstructed images including MIPs were obtained and reviewed to evaluate the vascular anatomy.  Contrast: OMNIPAQUE IOHEXOL 350 MG/ML.  Comparison:   None.  CTA CHEST  Findings:  Unenhanced images demonstrate no evidence of mural hematoma.  Mild calcified plaque in the aortic arch and descending thoracic aorta.  Extensive three-vessel coronary atherosclerotic calcification.  Enhanced images demonstrate no evidence of thoracic aortic aneurysm or dissection.  Central pulmonary arteries widely patent.  Heart moderately enlarged with left ventricular predominance.  No pericardial effusion.  Proximal great vessels mildly atherosclerotic though widely patent.  Airspace consolidation with air bronchograms in the left lower lobe and minimally in the lingula.  Associated small left pleural effusion.  Emphysematous changes throughout both lungs.  Dependent atelectasis posteriorly in the right lower lobe.  Lungs otherwise clear.  No evidence  of interstitial lung disease.  No right pleural effusion.  No significant mediastinal, hilar, or axillary lymphadenopathy. Visualized thyroid gland with ACE sub-centimeter nodule in the lower pole of the right lobe.  Bone window images demonstrate lower thoracic spondylosis and a hemangioma in the T10 vertebral body.   Review of the MIP images confirms the above findings.  IMPRESSION:  1.  No evidence of thoracic aortic aneurysm or dissection. 2.  Pneumonia involving the left lower lobe and lingula. Associated small left parapneumonic effusion. 3.  Cardiomegaly with left ventricular predominance.  Extensive three-vessel coronary atherosclerosis. 4.  COPD/emphysema.  CTA ABDOMEN AND PELVIS  Findings:  No evidence of abdominal aortic aneurysm or dissection. Mild atherosclerosis involving the distal abdominal aorta. Atherosclerosis at the origin of the celiac artery, SMA, and the left renal artery, without significant stenoses.  IMA patent. Iliofemoral arterial systems widely patent bilaterally with mild atherosclerosis.  Very small simple cysts in the medial segment left lobe anterior segment right lobe of liver; no significant focal hepatic parenchymal abnormalities.  Hyperdense round lesion in the posterior mid spleen likely a small hemangioma; no significant abnormalities involving the spleen, allowing for the early arterial phase enhancement.  Normal-appearing pancreas, right adrenal gland, and gallbladder.  Approximate 1.2 cm diameter nodule involving the left adrenal gland.  No biliary ductal dilation.  Numerous very small bilateral renal calculi without evidence of obstruction, the largest stone approximating 4 mm in a lower pole calix of the right kidney.  Approximate 3.5 cm simple cyst arising from the mid left kidney.  No significant focal parenchymal abnormality involving either kidney.  Very small hiatal hernia.  Stomach decompressed otherwise unremarkable.  Normal-appearing small bowel.  Scattered sigmoid  colon diverticula without evidence of acute diverticulitis; remainder of the colon unremarkable.  Appendix surgically absent. No ascites.  Mild median lobe prostate gland enlargement consistent with age. Normal seminal vesicles.  Urinary bladder decompressed and unremarkable.  Numerous pelvic phleboliths.  Bone window images demonstrate lower thoracic spondylosis, degenerative disc disease at L5-S1, facet degenerative changes involving the lumbar spine, degenerative changes in the sacroiliac joints, and degenerative changes in the hips.   Review of the MIP images confirms the above findings.  IMPRESSION:  1.  No evidence of abdominal aortic aneurysm or dissection. 2.  Numerous small nonobstructing bilateral renal calculi. 3.  1.2 cm diameter left adrenal nodule, statistically  consistent with a small adenoma. 4.  Very small hiatal hernia. 5.  Mild median lobe prostate gland enlargement. 6.  Sigmoid colon diverticulosis without evidence of acute diverticulitis.   Original Report Authenticated By: Hulan Saas, M.D.    Ct Angio Abd/pel W/ And/or W/o  05/13/2012  *RADIOLOGY REPORT*  Clinical Data:  Unexplained chest pain and abdominal pain.  CT ANGIOGRAPHY CHEST, ABDOMEN AND PELVIS  Technique:  Multidetector CT imaging through the chest, abdomen and pelvis was performed using the standard protocol during bolus administration of intravenous contrast.  Multiplanar reconstructed images including MIPs were obtained and reviewed to evaluate the vascular anatomy.  Contrast: OMNIPAQUE IOHEXOL 350 MG/ML.  Comparison:   None.  CTA CHEST  Findings:  Unenhanced images demonstrate no evidence of mural hematoma.  Mild calcified plaque in the aortic arch and descending thoracic aorta.  Extensive three-vessel coronary atherosclerotic calcification.  Enhanced images demonstrate no evidence of thoracic aortic aneurysm or dissection.  Central pulmonary arteries widely patent.  Heart moderately enlarged with left ventricular  predominance.  No pericardial effusion.  Proximal great vessels mildly atherosclerotic though widely patent.  Airspace consolidation with air bronchograms in the left lower lobe and minimally in the lingula.  Associated small left pleural effusion.  Emphysematous changes throughout both lungs.  Dependent atelectasis posteriorly in the right lower lobe.  Lungs otherwise clear.  No evidence of interstitial lung disease.  No right pleural effusion.  No significant mediastinal, hilar, or axillary lymphadenopathy. Visualized thyroid gland with ACE sub-centimeter nodule in the lower pole of the right lobe.  Bone window images demonstrate lower thoracic spondylosis and a hemangioma in the T10 vertebral body.   Review of the MIP images confirms the above findings.  IMPRESSION:  1.  No evidence of thoracic aortic aneurysm or dissection. 2.  Pneumonia involving the left lower lobe and lingula. Associated small left parapneumonic effusion. 3.  Cardiomegaly with left ventricular predominance.  Extensive three-vessel coronary atherosclerosis. 4.  COPD/emphysema.  CTA ABDOMEN AND PELVIS  Findings:  No evidence of abdominal aortic aneurysm or dissection. Mild atherosclerosis involving the distal abdominal aorta. Atherosclerosis at the origin of the celiac artery, SMA, and the left renal artery, without significant stenoses.  IMA patent. Iliofemoral arterial systems widely patent bilaterally with mild atherosclerosis.  Very small simple cysts in the medial segment left lobe anterior segment right lobe of liver; no significant focal hepatic parenchymal abnormalities.  Hyperdense round lesion in the posterior mid spleen likely a small hemangioma; no significant abnormalities involving the spleen, allowing for the early arterial phase enhancement.  Normal-appearing pancreas, right adrenal gland, and gallbladder.  Approximate 1.2 cm diameter nodule involving the left adrenal gland.  No biliary ductal dilation.  Numerous very small  bilateral renal calculi without evidence of obstruction, the largest stone approximating 4 mm in a lower pole calix of the right kidney.  Approximate 3.5 cm simple cyst arising from the mid left kidney.  No significant focal parenchymal abnormality involving either kidney.  Very small hiatal hernia.  Stomach decompressed otherwise unremarkable.  Normal-appearing small bowel.  Scattered sigmoid colon diverticula without evidence of acute diverticulitis; remainder of the colon unremarkable.  Appendix surgically absent. No ascites.  Mild median lobe prostate gland enlargement consistent with age. Normal seminal vesicles.  Urinary bladder decompressed and unremarkable.  Numerous pelvic phleboliths.  Bone window images demonstrate lower thoracic spondylosis, degenerative disc disease at L5-S1, facet degenerative changes involving the lumbar spine, degenerative changes in the sacroiliac joints, and degenerative changes in the  hips.   Review of the MIP images confirms the above findings.  IMPRESSION:  1.  No evidence of abdominal aortic aneurysm or dissection. 2.  Numerous small nonobstructing bilateral renal calculi. 3.  1.2 cm diameter left adrenal nodule, statistically consistent with a small adenoma. 4.  Very small hiatal hernia. 5.  Mild median lobe prostate gland enlargement. 6.  Sigmoid colon diverticulosis without evidence of acute diverticulitis.   Original Report Authenticated By: Hulan Saas, M.D.    US Thoracentesis Asp Pleural Space W/img Guide  05/23/2012  *RADIOLOGY REPORT*  Clinical Data:  Pneumonia, bilateral pleural effusions request for diagnostic and therapeutic thoracentesis  ULTRASOUND GUIDED right THORACENTESIS  Comparison:  None  An ultrasound guided thoracentesis was thoroughly discussed with the patient and questions answered.  The benefits, risks, alternatives and complications were also discussed.  The patient understands and wishes to proceed with the procedure.  Written consent was  obtained.  Imaging finds bilateral small pleural effusions. The right sided effusion was slightly lasrger than the left. Additionally, some consolidated left lung was adjacent to the pleura, preventing a safe window for thoracentesis. Therefore, the right side was chosen for thoracentesis.  Ultrasound was performed to localize and mark an adequate pocket of fluid in the right chest.  The area was then prepped and draped in the normal sterile fashion.  1% Lidocaine was used for local anesthesia.  Under ultrasound guidance a 19 gauge Yueh catheter was introduced.  Thoracentesis was performed.  The catheter was removed and a dressing applied.  Complications:  None immediate  Findings: A total of approximately 230 ml of hazy yellow fluid was removed. A fluid sample was sent for laboratory analysis.  IMPRESSION: Successful ultrasound guided right thoracentesis yielding 230 ml of pleural fluid.  Read by Brayton El PA-C   Original Report Authenticated By: Richarda Overlie, M.D.     Microbiology: Recent Results (from the past 240 hour(s))  URINE CULTURE     Status: None   Collection Time    05/19/12 12:40 AM      Result Value Range Status   Specimen Description URINE, CLEAN CATCH   Final   Special Requests ADDED 0111   Final   Culture  Setup Time 05/19/2012 20:35   Final   Colony Count NO GROWTH   Final   Culture NO GROWTH   Final   Report Status 05/20/2012 FINAL   Final  CULTURE, BLOOD (ROUTINE X 2)     Status: None   Collection Time    05/19/12 12:50 AM      Result Value Range Status   Specimen Description BLOOD LEFT ARM   Final   Special Requests BOTTLES DRAWN AEROBIC AND ANAEROBIC 10CC EACH   Final   Culture  Setup Time 05/19/2012 15:46   Final   Culture NO GROWTH 5 DAYS   Final   Report Status 05/26/2012 FINAL   Final  CULTURE, BLOOD (ROUTINE X 2)     Status: None   Collection Time    05/19/12  1:10 AM      Result Value Range Status   Specimen Description BLOOD LEFT HAND   Final   Special  Requests BOTTLES DRAWN AEROBIC AND ANAEROBIC 10CC EACH   Final   Culture  Setup Time 05/19/2012 15:46   Final   Culture NO GROWTH 5 DAYS   Final   Report Status 05/26/2012 FINAL   Final  BODY FLUID CULTURE     Status: None   Collection Time  05/23/12 10:38 AM      Result Value Range Status   Specimen Description FLUID RIGHT PLEURAL   Final   Special Requests NORMAL   Final   Gram Stain     Final   Value: FEW WBC PRESENT,BOTH PMN AND MONONUCLEAR     NO ORGANISMS SEEN   Culture NO GROWTH 3 DAYS   Final   Report Status 05/26/2012 FINAL   Final     Labs: Basic Metabolic Panel:  Recent Labs Lab 05/20/12 0625  NA 138  K 3.7  CL 104  CO2 24  GLUCOSE 116*  BUN 17  CREATININE 0.70  CALCIUM 8.7   Liver Function Tests: No results found for this basename: AST, ALT, ALKPHOS, BILITOT, PROT, ALBUMIN,  in the last 168 hours No results found for this basename: LIPASE, AMYLASE,  in the last 168 hours No results found for this basename: AMMONIA,  in the last 168 hours CBC:  Recent Labs Lab 05/20/12 0625  WBC 8.5  HGB 11.3*  HCT 32.8*  MCV 83.2  PLT 255   Cardiac Enzymes:  Recent Labs Lab 05/19/12 2250  TROPONINI <0.30   BNP: BNP (last 3 results) No results found for this basename: PROBNP,  in the last 8760 hours CBG: No results found for this basename: GLUCAP,  in the last 168 hours     Signed:  Ahyan Kreeger  [facility] 05/26/2012, 5:15 PM

## 2012-05-26 DIAGNOSIS — J90 Pleural effusion, not elsewhere classified: Secondary | ICD-10-CM | POA: Diagnosis present

## 2012-05-26 LAB — CULTURE, BLOOD (ROUTINE X 2): Culture: NO GROWTH

## 2012-05-26 LAB — BODY FLUID CULTURE
Culture: NO GROWTH
Special Requests: NORMAL

## 2012-05-29 DIAGNOSIS — J449 Chronic obstructive pulmonary disease, unspecified: Secondary | ICD-10-CM | POA: Diagnosis not present

## 2012-05-29 DIAGNOSIS — Q619 Cystic kidney disease, unspecified: Secondary | ICD-10-CM | POA: Diagnosis not present

## 2012-05-29 DIAGNOSIS — J90 Pleural effusion, not elsewhere classified: Secondary | ICD-10-CM | POA: Diagnosis not present

## 2012-05-29 DIAGNOSIS — D3500 Benign neoplasm of unspecified adrenal gland: Secondary | ICD-10-CM | POA: Diagnosis not present

## 2012-05-29 DIAGNOSIS — I2510 Atherosclerotic heart disease of native coronary artery without angina pectoris: Secondary | ICD-10-CM | POA: Diagnosis not present

## 2012-05-29 DIAGNOSIS — J811 Chronic pulmonary edema: Secondary | ICD-10-CM | POA: Diagnosis not present

## 2012-05-29 DIAGNOSIS — K449 Diaphragmatic hernia without obstruction or gangrene: Secondary | ICD-10-CM | POA: Diagnosis not present

## 2012-05-29 DIAGNOSIS — J189 Pneumonia, unspecified organism: Secondary | ICD-10-CM | POA: Diagnosis not present

## 2012-06-05 ENCOUNTER — Encounter (HOSPITAL_COMMUNITY): Payer: Self-pay | Admitting: Emergency Medicine

## 2012-06-05 ENCOUNTER — Inpatient Hospital Stay (HOSPITAL_COMMUNITY)
Admission: EM | Admit: 2012-06-05 | Discharge: 2012-06-10 | DRG: 175 | Disposition: A | Discharge status: Home / Self Care | Payer: Medicare Other | Attending: Internal Medicine | Admitting: Internal Medicine

## 2012-06-05 ENCOUNTER — Emergency Department (HOSPITAL_COMMUNITY): Payer: Medicare Other

## 2012-06-05 DIAGNOSIS — E1159 Type 2 diabetes mellitus with other circulatory complications: Secondary | ICD-10-CM | POA: Diagnosis present

## 2012-06-05 DIAGNOSIS — R071 Chest pain on breathing: Secondary | ICD-10-CM

## 2012-06-05 DIAGNOSIS — G4730 Sleep apnea, unspecified: Secondary | ICD-10-CM | POA: Diagnosis present

## 2012-06-05 DIAGNOSIS — J189 Pneumonia, unspecified organism: Secondary | ICD-10-CM

## 2012-06-05 DIAGNOSIS — M359 Systemic involvement of connective tissue, unspecified: Secondary | ICD-10-CM | POA: Diagnosis not present

## 2012-06-05 DIAGNOSIS — J90 Pleural effusion, not elsewhere classified: Secondary | ICD-10-CM | POA: Diagnosis not present

## 2012-06-05 DIAGNOSIS — I10 Essential (primary) hypertension: Secondary | ICD-10-CM | POA: Diagnosis present

## 2012-06-05 DIAGNOSIS — R5383 Other fatigue: Secondary | ICD-10-CM | POA: Diagnosis not present

## 2012-06-05 DIAGNOSIS — J9600 Acute respiratory failure, unspecified whether with hypoxia or hypercapnia: Secondary | ICD-10-CM | POA: Diagnosis not present

## 2012-06-05 DIAGNOSIS — R0602 Shortness of breath: Secondary | ICD-10-CM | POA: Diagnosis not present

## 2012-06-05 DIAGNOSIS — I152 Hypertension secondary to endocrine disorders: Secondary | ICD-10-CM | POA: Diagnosis present

## 2012-06-05 DIAGNOSIS — K219 Gastro-esophageal reflux disease without esophagitis: Secondary | ICD-10-CM | POA: Diagnosis present

## 2012-06-05 DIAGNOSIS — D8989 Other specified disorders involving the immune mechanism, not elsewhere classified: Secondary | ICD-10-CM

## 2012-06-05 DIAGNOSIS — J9819 Other pulmonary collapse: Secondary | ICD-10-CM | POA: Diagnosis not present

## 2012-06-05 DIAGNOSIS — I2699 Other pulmonary embolism without acute cor pulmonale: Secondary | ICD-10-CM | POA: Diagnosis not present

## 2012-06-05 DIAGNOSIS — R079 Chest pain, unspecified: Secondary | ICD-10-CM | POA: Diagnosis not present

## 2012-06-05 DIAGNOSIS — I517 Cardiomegaly: Secondary | ICD-10-CM

## 2012-06-05 DIAGNOSIS — R404 Transient alteration of awareness: Secondary | ICD-10-CM | POA: Diagnosis not present

## 2012-06-05 LAB — PRO B NATRIURETIC PEPTIDE: Pro B Natriuretic peptide (BNP): 209 pg/mL (ref 0–450)

## 2012-06-05 LAB — CBC WITH DIFFERENTIAL/PLATELET
Basophils Relative: 0 % (ref 0–1)
Eosinophils Absolute: 0.1 10*3/uL (ref 0.0–0.7)
Eosinophils Relative: 1 % (ref 0–5)
HCT: 36.3 % — ABNORMAL LOW (ref 39.0–52.0)
Hemoglobin: 12.2 g/dL — ABNORMAL LOW (ref 13.0–17.0)
Lymphs Abs: 1 10*3/uL (ref 0.7–4.0)
MCH: 27.6 pg (ref 26.0–34.0)
MCHC: 33.6 g/dL (ref 30.0–36.0)
MCV: 82.1 fL (ref 78.0–100.0)
Monocytes Absolute: 0.4 10*3/uL (ref 0.1–1.0)
Monocytes Relative: 6 % (ref 3–12)
Neutrophils Relative %: 78 % — ABNORMAL HIGH (ref 43–77)
RBC: 4.42 MIL/uL (ref 4.22–5.81)

## 2012-06-05 LAB — COMPREHENSIVE METABOLIC PANEL
ALT: 33 U/L (ref 0–53)
BUN: 13 mg/dL (ref 6–23)
CO2: 25 mEq/L (ref 19–32)
Calcium: 8.8 mg/dL (ref 8.4–10.5)
GFR calc Af Amer: >90 mL/min (ref >90)
GFR calc non Af Amer: >90 mL/min (ref >90)
Glucose, Bld: 93 mg/dL (ref 70–99)
Sodium: 139 mEq/L (ref 135–145)

## 2012-06-05 LAB — POCT I-STAT TROPONIN I

## 2012-06-05 LAB — URINALYSIS, ROUTINE W REFLEX MICROSCOPIC
Bilirubin Urine: NEGATIVE
Glucose, UA: NEGATIVE mg/dL
Ketones, ur: NEGATIVE mg/dL
Nitrite: NEGATIVE
Protein, ur: NEGATIVE mg/dL
pH: 7 (ref 5.0–8.0)

## 2012-06-05 MED ORDER — HYDROCODONE-ACETAMINOPHEN 5-325 MG PO TABS
1.0000 | ORAL_TABLET | ORAL | Status: DC | PRN
Start: 2012-06-05 — End: 2012-06-10
  Administered 2012-06-08: 2 via ORAL
  Administered 2012-06-08 (×2): 1 via ORAL
  Administered 2012-06-09: 2 via ORAL
  Filled 2012-06-05 (×3): qty 2

## 2012-06-05 MED ORDER — ENOXAPARIN SODIUM 100 MG/ML ~~LOC~~ SOLN
90.0000 mg | Freq: Two times a day (BID) | SUBCUTANEOUS | Status: DC
  Administered 2012-06-05: 90 mg via SUBCUTANEOUS
  Filled 2012-06-05 (×3): qty 1

## 2012-06-05 MED ORDER — PANTOPRAZOLE SODIUM 40 MG PO TBEC
40.0000 mg | DELAYED_RELEASE_TABLET | Freq: Every day | ORAL | Status: DC
  Administered 2012-06-05 – 2012-06-10 (×6): 40 mg via ORAL
  Filled 2012-06-05 (×6): qty 1

## 2012-06-05 MED ORDER — VANCOMYCIN HCL IN DEXTROSE 1-5 GM/200ML-% IV SOLN
1000.0000 mg | Freq: Once | INTRAVENOUS | Status: DC
  Filled 2012-06-05: qty 200

## 2012-06-05 MED ORDER — SODIUM CHLORIDE 0.9 % IJ SOLN
3.0000 mL | Freq: Two times a day (BID) | INTRAMUSCULAR | Status: DC
  Administered 2012-06-05 – 2012-06-10 (×9): 3 mL via INTRAVENOUS

## 2012-06-05 MED ORDER — FOLIC ACID 1 MG PO TABS
1.0000 mg | ORAL_TABLET | Freq: Every day | ORAL | Status: DC
  Administered 2012-06-05 – 2012-06-10 (×6): 1 mg via ORAL
  Filled 2012-06-05 (×6): qty 1

## 2012-06-05 MED ORDER — FERROUS SULFATE 325 (65 FE) MG PO TABS
325.0000 mg | ORAL_TABLET | Freq: Every day | ORAL | Status: DC
  Administered 2012-06-06 – 2012-06-10 (×5): 325 mg via ORAL
  Filled 2012-06-05 (×6): qty 1

## 2012-06-05 MED ORDER — ALBUTEROL SULFATE (5 MG/ML) 0.5% IN NEBU: 5.0000 mg | INHALATION_SOLUTION | RESPIRATORY_TRACT | Status: DC | PRN

## 2012-06-05 MED ORDER — MORPHINE SULFATE 2 MG/ML IJ SOLN
1.0000 mg | INTRAMUSCULAR | Status: DC | PRN
  Administered 2012-06-06 – 2012-06-08 (×4): 1 mg via INTRAVENOUS
  Filled 2012-06-05 (×4): qty 1

## 2012-06-05 MED ORDER — ONDANSETRON HCL 4 MG PO TABS
4.0000 mg | ORAL_TABLET | Freq: Four times a day (QID) | ORAL | Status: DC | PRN
  Administered 2012-06-08: 4 mg via ORAL
  Filled 2012-06-05: qty 1

## 2012-06-05 MED ORDER — WARFARIN - PHARMACIST DOSING INPATIENT: Freq: Every day | Status: DC

## 2012-06-05 MED ORDER — ONDANSETRON HCL 4 MG/2ML IJ SOLN
4.0000 mg | Freq: Four times a day (QID) | INTRAMUSCULAR | Status: DC | PRN
  Administered 2012-06-06 – 2012-06-10 (×5): 4 mg via INTRAVENOUS
  Filled 2012-06-05 (×5): qty 2

## 2012-06-05 MED ORDER — PIPERACILLIN-TAZOBACTAM 3.375 G IVPB
3.3750 g | Freq: Once | INTRAVENOUS | Status: DC
  Filled 2012-06-05: qty 50

## 2012-06-05 MED ORDER — ALBUTEROL SULFATE (5 MG/ML) 0.5% IN NEBU
5.0000 mg | INHALATION_SOLUTION | Freq: Once | RESPIRATORY_TRACT | Status: AC
  Administered 2012-06-05: 5 mg via RESPIRATORY_TRACT
  Filled 2012-06-05: qty 1

## 2012-06-05 MED ORDER — COUMADIN BOOK
Freq: Once | Status: AC
  Administered 2012-06-06: 10:00:00
  Filled 2012-06-05: qty 1

## 2012-06-05 MED ORDER — WARFARIN SODIUM 5 MG PO TABS
5.0000 mg | ORAL_TABLET | Freq: Once | ORAL | Status: AC
  Administered 2012-06-05: 5 mg via ORAL
  Filled 2012-06-05: qty 1

## 2012-06-05 MED ORDER — ONDANSETRON HCL 4 MG/2ML IJ SOLN
4.0000 mg | Freq: Once | INTRAMUSCULAR | Status: AC
  Administered 2012-06-05: 4 mg via INTRAVENOUS
  Filled 2012-06-05: qty 2

## 2012-06-05 MED ORDER — SODIUM CHLORIDE 0.9 % IV SOLN
INTRAVENOUS | Status: DC
  Administered 2012-06-05: 17:00:00 via INTRAVENOUS

## 2012-06-05 MED ORDER — WARFARIN VIDEO
Freq: Once | Status: AC
  Administered 2012-06-05: 22:00:00

## 2012-06-05 MED ORDER — FENOFIBRATE 160 MG PO TABS
160.0000 mg | ORAL_TABLET | Freq: Every day | ORAL | Status: DC
  Administered 2012-06-06 – 2012-06-10 (×5): 160 mg via ORAL
  Filled 2012-06-05 (×5): qty 1

## 2012-06-05 MED ORDER — IOHEXOL 350 MG/ML SOLN
100.0000 mL | Freq: Once | INTRAVENOUS | Status: AC | PRN
  Administered 2012-06-05: 100 mL via INTRAVENOUS

## 2012-06-05 NOTE — ED Notes (Signed)
JYN:[id number]<AO> Expected date:<BR> Expected time:<BR> Means of arrival:<BR> Comments:<BR> 78 y/o M weakness

## 2012-06-05 NOTE — Progress Notes (Signed)
ANTICOAGULATION CONSULT NOTE - Initial Consult  Pharmacy Consult for Lovenox & Warfarin Indication: pulmonary embolus  Allergies  Allergen Reactions  . Lipitor (Atorvastatin Calcium)     Myalgias   Patient Measurements:   TBW 90.3 kg  Vital Signs: Temp: 98.7 F (37.1 C) (04/09 1844) Temp src: Oral (04/09 1844) BP: 141/76 mmHg (04/09 1844) Pulse Rate: 95 (04/09 1844)  Labs:  Recent Labs  06/05/12 1700  HGB 12.2*  HCT 36.3*  PLT 313  CREATININE 0.61   The CrCl is unknown because both a height and weight (above a minimum accepted value) are required for this calculation.  Medical History: Past Medical History  Diagnosis Date  . Kidney stones   . Hyperlipidemia   . GERD (gastroesophageal reflux disease)   . Carpal tunnel syndrome, bilateral   . Sleep apnea     uses cpap every night  . AAA (abdominal aortic aneurysm)     resolved by ct scan 09-2011  . Arthritis   . Anemia   . Barrett's esophagus   . Hypertension   . Pneumonia    Medications:  Scheduled:  . [COMPLETED] albuterol  5 mg Nebulization Once  . enoxaparin (LOVENOX) injection  90 mg Subcutaneous BID  . [START ON 06/06/2012] fenofibrate  160 mg Oral Daily  . [START ON 06/06/2012] ferrous sulfate  325 mg Oral Q breakfast  . folic acid  1 mg Oral Daily  . [COMPLETED] ondansetron  4 mg Intravenous Once  . pantoprazole  40 mg Oral Daily  . sodium chloride  3 mL Intravenous Q12H  . warfarin  5 mg Oral Once  . [START ON 06/06/2012] Warfarin - Pharmacist Dosing Inpatient   Does not apply q1800    Assessment: 5 yoM admit with weakness & SOB, previous admits 3/17 and 3/23 for pleuritic chest pain, treated for PNA. Chest CT: positive for PE, begin Lovenox & Warfarin per pharmacy  INR 3/27 was 1.20, was not on anti-coagulation.  PE noted as "small burden", O2 sats currently 94% on room air.  Goal of Therapy:  INR 2-3 Anti-Xa level 0.6-1.2 units/ml 4hrs after LMWH dose given Monitor platelets by  anticoagulation protocol: Yes Plan 5 days minimum overlap of Lovenox/Warfarin. Require 48 hr with therapeutic INR during overlap.   Plan:  Lovenox 90mg  SQ q12, can consider dose change to 1.5mg /kg q24hr Warfarin 5mg  x1  Warfarin teaching booklet Warfarin education  Otho Bellows PharmD Pager [phone] 06/05/2012, 9:35 PM.

## 2012-06-05 NOTE — ED Provider Notes (Signed)
History     CSN: [id number]  Arrival date & time 06/05/12  1436   First MD Initiated Contact with Patient 06/05/12 1640      Chief Complaint  Patient presents with  . Weakness  . Shortness of Breath    (Consider location/radiation/quality/duration/timing/severity/associated sxs/prior treatment) Patient is a 78 y.o. male presenting with weakness and shortness of breath. The history is provided by the patient.  Weakness Associated symptoms include shortness of breath.  Shortness of Breath  patient here complaining of dyspnea on exertion as well as some posttussive emesis. Recently diagnosed with pneumonia and complete his treatment on March 28. No fever or chills. Denies any anginal type chest pain. Did complete a course of antibiotics. No rashes or urinary symptoms. Symptoms have been persistent and without lower extremity edema. No treatment used prior to arrival.  Past Medical History  Diagnosis Date  . Kidney stones   . Hyperlipidemia   . GERD (gastroesophageal reflux disease)   . Carpal tunnel syndrome, bilateral   . Sleep apnea     uses cpap every night  . AAA (abdominal aortic aneurysm)     resolved by ct scan 09-2011  . Arthritis   . Anemia   . Barrett's esophagus   . Hypertension   . Pneumonia     Past Surgical History  Procedure Laterality Date  . Appendectomy    . Hernia repair    . Hand surgery      scar repaired lt hand  . Tonsillectomy    . Cystoscopy w/ stone manipulation  2001,2009  . Carpal tunnel release  10/12/2011    Procedure: CARPAL TUNNEL RELEASE;  Surgeon: Wyn Forster., MD;  Location: [facility];  Service: Orthopedics;  Laterality: Left;  . Carpal tunnel release  12/01/2011    Procedure: CARPAL TUNNEL RELEASE;  Surgeon: Wyn Forster., MD;  Location: [facility];  Service: Orthopedics;  Laterality: Right;  . Knee arthroscopy Left     Family History  Problem Relation Age of Onset  . Heart disease Father    . Hyperlipidemia Father   . Hypertension Father   . Other Father     VARICOSE VEIN  . Peripheral vascular disease Father     History  Substance Use Topics  . Smoking status: Never Smoker   . Smokeless tobacco: Never Used  . Alcohol Use: No      Review of Systems  Respiratory: Positive for shortness of breath.   Neurological: Positive for weakness.  All other systems reviewed and are negative.    Allergies  Lipitor  Home Medications   Current Outpatient Rx  Name  Route  Sig  Dispense  Refill  . Bioflavonoid Products (BIOFLEX PO)   Oral   Take 1 tablet by mouth daily.         . calcium-vitamin D (OSCAL WITH D) 500-200 MG-UNIT per tablet   Oral   Take 1 tablet by mouth daily.         Marland Kitchen esomeprazole (NEXIUM) 40 MG capsule   Oral   Take 40 mg by mouth daily before breakfast.         . fenofibrate (TRICOR) 145 MG tablet   Oral   Take 145 mg by mouth daily.         . ferrous sulfate 325 (65 FE) MG tablet   Oral   Take 325 mg by mouth daily with breakfast.         .  fish oil-omega-3 fatty acids 1000 MG capsule   Oral   Take 1 g by mouth daily.         . folic acid (FOLVITE) 1 MG tablet   Oral   Take 1 mg by mouth daily.         Marland Kitchen ibuprofen (ADVIL,MOTRIN) 200 MG tablet   Oral   Take 400-800 mg by mouth every 8 (eight) hours as needed for pain.         . Multiple Vitamin (MULTIVITAMIN WITH MINERALS) TABS   Oral   Take 1 tablet by mouth daily.         . rosuvastatin (CRESTOR) 5 MG tablet   Oral   Take 5 mg by mouth daily.         . vitamin C (ASCORBIC ACID) 500 MG tablet   Oral   Take 500 mg by mouth daily.           BP 121/73  Pulse 93  Temp(Src) 97.8 F (36.6 C) (Oral)  Resp 25  SpO2 94%  Physical Exam  Nursing note and vitals reviewed. Constitutional: He is oriented to person, place, and time. He appears well-developed and well-nourished.  Non-toxic appearance. No distress.  HENT:  Head: Normocephalic and  atraumatic.  Eyes: Conjunctivae, EOM and lids are normal. Pupils are equal, round, and reactive to light.  Neck: Normal range of motion. Neck supple. No tracheal deviation present. No mass present.  Cardiovascular: Normal rate, regular rhythm and normal heart sounds.  Exam reveals no gallop.   No murmur heard. Pulmonary/Chest: Effort normal. No stridor. No respiratory distress. He has decreased breath sounds. He has wheezes. He has no rhonchi. He has no rales.  Abdominal: Soft. Normal appearance and bowel sounds are normal. He exhibits no distension. There is no tenderness. There is no rebound and no CVA tenderness.  Musculoskeletal: Normal range of motion. He exhibits no edema and no tenderness.  Neurological: He is alert and oriented to person, place, and time. He has normal strength. No cranial nerve deficit or sensory deficit. GCS eye subscore is 4. GCS verbal subscore is 5. GCS motor subscore is 6.  Skin: Skin is warm and dry. No abrasion and no rash noted.  Psychiatric: He has a normal mood and affect. His speech is normal and behavior is normal.    ED Course  Procedures (including critical care time)  Labs Reviewed  CBC WITH DIFFERENTIAL  LIPASE, BLOOD  COMPREHENSIVE METABOLIC PANEL   No results found.   No diagnosis found.    MDM   Date: 06/05/2012  Rate: 93  Rhythm: normal sinus rhythm  QRS Axis: normal  Intervals: normal  ST/T Wave abnormalities: nonspecific ST changes  Conduction Disutrbances:lafb  Narrative Interpretation:   Old EKG Reviewed: none available   8:18 PM Patient's CT scan shows pulmonary embolism as well as new-onset pneumonia. Patient started on a comparison and Zosyn as well as will be heparinized per pharmacy. He will be admitted by the hospitalist service       Toy Baker, MD 06/05/12 2019

## 2012-06-05 NOTE — H&P (Signed)
[facility] History and Physical  Tyler Norris ZHY:[id number] DOB: Nov 11, 1934 DOA: 06/05/2012  Referring physician: ED physician PCP: Irving Copas, MD   Chief Complaint: Shortness of breath   HPI:  Pt is 78 yo male who was recently hospitalized for CAP (diagnosed 05/13/2012 and was discharged on 05/15/2012, readmitted 03/22 due to pleuritic chet pain and discharged 05/26/2012). Pt was treated on readmission with broad spectrum ABX Vancomycin and Zosyn for presumptive HCAP but spectrum was narrowed down as CAP was thought to be more likely. ABX stopped on 05/22/2012 which completed total of 10 days therapy. Pt is now presenting with progressively worsening shortness of breath, initially started with exertion and has progressed to rest. He explains he has never really gotten better since his last discharge. He denies any new chest discomfort, explains it is now somewhat of chronic in nature, he denies fevers, chills, no other systemic concerns. Pt denies any specific abdominal or urinary concerns.Of note, pt has had right sided diagnostic and therapeutic thoracentesis done on last admission (05/23/2012) and the findings were consistent with exudative effusion. In addition 2 D ECHO (05/14/2012) indicative of normal diastolic and systolic function, EF 55%  In ED, CT angio chest done and with new small PE. [facility] asked to admit for further evaluation. Vanc and Zosyn given x 1 dose. Heparin started in ED.  Assessment and Plan:  Principal Problem:   Acute respiratory failure - this is likely multifactorial and secondary to acute PE imposed on bilateral pleural effusion in the setting of atelectasis - Pt was given Vanc and Zosyn in ED - I have reviewed images with Dr. Herma Carson (PCCM), agreed with d/c ABX as pt is afebrile and non leukocytosis, pt relatively asymptomatic  - I do not this that thoracentesis is needed at this time as it was recently done but may be considered if pt does not improve  clinically - discussed with PCCM choice of anticoagulant in the setting of small clot burden, will start Lovenox with transition to Coumadin, will ask pharmacy to assist with dosing Active Problems:   PE (pulmonary embolism) - Lovenox as noted above, start also transition to Coumadin    Pleural effusion exudative - monitor clinical status to decide if repeat thoracentesis needed    HTN (hypertension) - stable on admission, pt takes no medications at home for BP control   Code Status: Full Family Communication: Family at bedside Disposition Plan: Admit to telemetry   Review of Systems:  Constitutional: Negative for fever, chills and malaise/fatigue. Negative for diaphoresis.  HENT: Negative for hearing loss, ear pain, nosebleeds, congestion, sore throat, neck pain, tinnitus and ear discharge.   Eyes: Negative for blurred vision, double vision, photophobia, pain, discharge and redness.  Respiratory: Negative for cough, hemoptysis, sputum production, positive for shortness of breath, negative for wheezing and stridor.   Cardiovascular: Negative for palpitations, orthopnea, claudication and leg swelling.  Gastrointestinal: Negative for nausea, vomiting and abdominal pain. Negative for heartburn, constipation, blood in stool and melena.  Genitourinary: Negative for dysuria, urgency, frequency, hematuria and flank pain.  Musculoskeletal: Negative for myalgias, back pain, joint pain and falls.  Skin: Negative for itching and rash.  Neurological: Negative for dizziness and weakness. Negative for tingling, tremors, sensory change, speech change, focal weakness, loss of consciousness and headaches.  Endo/Heme/Allergies: Negative for environmental allergies and polydipsia. Does not bruise/bleed easily.  Psychiatric/Behavioral: Negative for suicidal ideas. The patient is not nervous/anxious.    Past Medical History  Diagnosis Date  . Kidney stones   .  Hyperlipidemia   . GERD (gastroesophageal  reflux disease)   . Carpal tunnel syndrome, bilateral   . Sleep apnea     uses cpap every night  . AAA (abdominal aortic aneurysm)     resolved by ct scan 09-2011  . Arthritis   . Anemia   . Barrett's esophagus   . Hypertension   . Pneumonia     Past Surgical History  Procedure Laterality Date  . Appendectomy    . Hernia repair    . Hand surgery      scar repaired lt hand  . Tonsillectomy    . Cystoscopy w/ stone manipulation  2001,2009  . Carpal tunnel release  10/12/2011    Procedure: CARPAL TUNNEL RELEASE;  Surgeon: Wyn Forster., MD;  Location: [facility];  Service: Orthopedics;  Laterality: Left;  . Carpal tunnel release  12/01/2011    Procedure: CARPAL TUNNEL RELEASE;  Surgeon: Wyn Forster., MD;  Location: [facility];  Service: Orthopedics;  Laterality: Right;  . Knee arthroscopy Left     Social History:  reports that he has never smoked. He has never used smokeless tobacco. He reports that he does not drink alcohol or use illicit drugs.  Allergies  Allergen Reactions  . Lipitor (Atorvastatin Calcium)     Myalgias    Family History  Problem Relation Age of Onset  . Heart disease Father   . Hyperlipidemia Father   . Hypertension Father   . Other Father     VARICOSE VEIN  . Peripheral vascular disease Father     Prior to Admission medications   Medication Sig Start Date End Date Taking? Authorizing Provider  Bioflavonoid Products (BIOFLEX PO) Take 1 tablet by mouth daily.   Yes Historical Provider, MD  calcium-vitamin D (OSCAL WITH D) 500-200 MG-UNIT per tablet Take 1 tablet by mouth daily.   Yes Historical Provider, MD  esomeprazole (NEXIUM) 40 MG capsule Take 40 mg by mouth daily before breakfast.   Yes Historical Provider, MD  fenofibrate (TRICOR) 145 MG tablet Take 145 mg by mouth daily.   Yes Historical Provider, MD  ferrous sulfate 325 (65 FE) MG tablet Take 325 mg by mouth daily with breakfast.   Yes Historical  Provider, MD  fish oil-omega-3 fatty acids 1000 MG capsule Take 1 g by mouth daily.   Yes Historical Provider, MD  folic acid (FOLVITE) 1 MG tablet Take 1 mg by mouth daily.   Yes Historical Provider, MD  ibuprofen (ADVIL,MOTRIN) 200 MG tablet Take 400-800 mg by mouth every 8 (eight) hours as needed for pain.   Yes Historical Provider, MD  Multiple Vitamin (MULTIVITAMIN WITH MINERALS) TABS Take 1 tablet by mouth daily.   Yes Historical Provider, MD  rosuvastatin (CRESTOR) 5 MG tablet Take 5 mg by mouth daily.   Yes Historical Provider, MD  vitamin C (ASCORBIC ACID) 500 MG tablet Take 500 mg by mouth daily.   Yes Historical Provider, MD    Physical Exam: Filed Vitals:   06/05/12 1441 06/05/12 1445 06/05/12 1707 06/05/12 1844  BP:  121/73  141/76  Pulse:  93  95  Temp:  97.8 F (36.6 C)  98.7 F (37.1 C)  TempSrc:  Oral  Oral  Resp:  25  24  SpO2: 97% 94% 97% 93%    Physical Exam  Constitutional: Appears well-developed and well-nourished. No distress.  HENT: Normocephalic. External right and left ear normal. Oropharynx is clear and moist.  Eyes:  Conjunctivae and EOM are normal. PERRLA, no scleral icterus.  Neck: Normal ROM. Neck supple. No JVD. No tracheal deviation. No thyromegaly.  CVS: RRR, S1/S2 +, no murmurs, no gallops, no carotid bruit.  Pulmonary: Effort and breath sounds normal, bibasilar crackles, decreased breath sounds at bases  Abdominal: Soft. BS +,  no distension, tenderness, rebound or guarding.  Musculoskeletal: Normal range of motion. No edema and no tenderness.  Lymphadenopathy: No lymphadenopathy noted, cervical, inguinal. Neuro: Alert. Normal reflexes, muscle tone coordination. No cranial nerve deficit. Skin: Skin is warm and dry. No rash noted. Not diaphoretic. No erythema. No pallor.  Psychiatric: Normal mood and affect. Behavior, judgment, thought content normal.   Labs on Admission:  Basic Metabolic Panel:  Recent Labs Lab 06/05/12 1700  NA 139  K 4.0   CL 104  CO2 25  GLUCOSE 93  BUN 13  CREATININE 0.61  CALCIUM 8.8   Liver Function Tests:  Recent Labs Lab 06/05/12 1700  AST 45*  ALT 33  ALKPHOS 108  BILITOT 0.4  PROT 6.2  ALBUMIN 2.4*    Recent Labs Lab 06/05/12 1700  LIPASE 28   CBC:  Recent Labs Lab 06/05/12 1700  WBC 6.5  NEUTROABS 5.1  HGB 12.2*  HCT 36.3*  MCV 82.1  PLT 313   Radiological Exams on Admission: Dg Chest 2 View 06/05/2012   Bilateral pleural effusions and bibasilar atelectasis.    Ct Angio Chest Pe W/cm &/or Wo Cm 06/05/2012   1.  Positive for small burden pulmonary emboli involving a segmental RUL pulmonary artery, the right middle lobe or pulmonary artery and subsegmental arteries in LLL.   2.  Focal decreased enhancement of the posteroinferior right lung parenchyma with internal air bronchograms concerning for a small focus of a potentially necrotic pneumonia.   3.  Moderate right pleural effusion with concave margin superiorly suggesting developing loculation.  No pleural enhancement to suggest empyema.   4.  Small layering left pleural effusion   5.  Additional ancillary findings without significant interval change compared to 05/13/2012.   EKG: Normal sinus rhythm, no ST/T wave changes  Debbora Presto, MD  [facility] Pager [phone]  If 7PM-7AM, please contact night-coverage [email or web address] Password [facility] 06/05/2012, 8:27 PM

## 2012-06-05 NOTE — ED Notes (Signed)
Per EMS: Pt is from home and c/o weakness. Pt was recently discharged on 05/24/12 for pneumonia. Pt reports increased weakness and shortness of breath with exertion today.

## 2012-06-06 LAB — CBC
HCT: 35.9 % — ABNORMAL LOW (ref 39.0–52.0)
MCH: 27.8 pg (ref 26.0–34.0)
MCV: 83.1 fL (ref 78.0–100.0)
Platelets: 267 10*3/uL (ref 150–400)
RBC: 4.32 MIL/uL (ref 4.22–5.81)

## 2012-06-06 LAB — BASIC METABOLIC PANEL
CO2: 24 mEq/L (ref 19–32)
Calcium: 8.6 mg/dL (ref 8.4–10.5)
Creatinine, Ser: 0.65 mg/dL (ref 0.50–1.35)
Glucose, Bld: 92 mg/dL (ref 70–99)
Sodium: 140 mEq/L (ref 135–145)

## 2012-06-06 MED ORDER — GUAIFENESIN 100 MG/5ML PO SOLN: 5.0000 mL | ORAL | Status: DC | PRN

## 2012-06-06 MED ORDER — BOOST PLUS PO LIQD
237.0000 mL | Freq: Two times a day (BID) | ORAL | Status: DC
  Administered 2012-06-06 – 2012-06-09 (×7): 237 mL via ORAL
  Filled 2012-06-06 (×10): qty 237

## 2012-06-06 MED ORDER — ADULT MULTIVITAMIN W/MINERALS CH
1.0000 | ORAL_TABLET | Freq: Every day | ORAL | Status: DC
  Administered 2012-06-06 – 2012-06-10 (×5): 1 via ORAL
  Filled 2012-06-06 (×5): qty 1

## 2012-06-06 MED ORDER — WARFARIN SODIUM 5 MG PO TABS
5.0000 mg | ORAL_TABLET | Freq: Once | ORAL | Status: AC
  Administered 2012-06-06: 5 mg via ORAL
  Filled 2012-06-06 (×2): qty 1

## 2012-06-06 MED ORDER — ENOXAPARIN SODIUM 80 MG/0.8ML ~~LOC~~ SOLN
1.0000 mg/kg | Freq: Two times a day (BID) | SUBCUTANEOUS | Status: DC
  Administered 2012-06-06 – 2012-06-09 (×8): 80 mg via SUBCUTANEOUS
  Filled 2012-06-06 (×10): qty 0.8

## 2012-06-06 NOTE — Progress Notes (Signed)
ANTICOAGULATION CONSULT NOTE - Follow-up Consult  Pharmacy Consult for Lovenox & Warfarin Indication: pulmonary embolus  Allergies  Allergen Reactions  . Lipitor (Atorvastatin Calcium)     Myalgias   Patient Measurements: Height: 5\' 9"  (175.3 cm) Weight: 180 lb (81.647 kg) IBW/kg (Calculated) : 70.7 TBW 81.6 kg  Vital Signs: Temp: 98.1 F (36.7 C) (04/10 0456) Temp src: Oral (04/10 0456) BP: 134/72 mmHg (04/10 0456) Pulse Rate: 87 (04/10 0456)  Labs:  Recent Labs  06/05/12 1700 06/06/12 0500  HGB 12.2* 12.0*  HCT 36.3* 35.9*  PLT 313 267  LABPROT  --  15.1  INR  --  1.21  CREATININE 0.61 0.65   Estimated Creatinine Clearance: 78.6 ml/min (by C-G formula based on Cr of 0.65).  Medical History: Past Medical History  Diagnosis Date  . Kidney stones   . Hyperlipidemia   . GERD (gastroesophageal reflux disease)   . Carpal tunnel syndrome, bilateral   . Sleep apnea     uses cpap every night  . AAA (abdominal aortic aneurysm)     resolved by ct scan 09-2011  . Arthritis   . Anemia   . Barrett's esophagus   . Hypertension   . Pneumonia    Medications:  Scheduled:  . [COMPLETED] albuterol  5 mg Nebulization Once  . coumadin book   Does not apply Once  . enoxaparin (LOVENOX) injection  90 mg Subcutaneous BID  . fenofibrate  160 mg Oral Daily  . ferrous sulfate  325 mg Oral Q breakfast  . folic acid  1 mg Oral Daily  . [COMPLETED] ondansetron  4 mg Intravenous Once  . pantoprazole  40 mg Oral Daily  . sodium chloride  3 mL Intravenous Q12H  . [COMPLETED] warfarin  5 mg Oral Once  . [COMPLETED] warfarin   Does not apply Once  . Warfarin - Pharmacist Dosing Inpatient   Does not apply q1800  . [DISCONTINUED] piperacillin-tazobactam (ZOSYN)  IV  3.375 g Intravenous Once  . [DISCONTINUED] vancomycin  1,000 mg Intravenous Once    Assessment: 73 yoM admit with weakness & SOB, previous admits 3/17 and 3/23 for pleuritic chest pain, treated for PNA. Chest CT:  positive for PE, started on Lovenox & Warfarin per pharmacy  D#2 Warfarin/Lovenox bridge  INR 3/27 was 1.20 without anticoagulation.  This morning INR = 1.21, no change.   CBC stable  Renal fxn:  SCr ok, CrCl ~ 79 ml/min  No bleeding noted/documented  Goal of Therapy:  INR 2-3 Anti-Xa level 0.6-1.2 units/ml 4hrs after LMWH dose given Monitor platelets by anticoagulation protocol: Yes Plan 5 days minimum overlap of Lovenox/Warfarin. Require 48 hr with therapeutic INR during overlap.   Plan:  Reduce lovenox to 80mg  SQ q12, can consider dose change to 1.5mg /kg q24hr Repeat Warfarin 5mg  x1  Warfarin teaching booklet Warfarin education  Haynes Hoehn, PharmD 06/06/2012 9:45 AM  Pager: [phone]

## 2012-06-06 NOTE — Progress Notes (Signed)
INITIAL NUTRITION ASSESSMENT  DOCUMENTATION CODES Per approved criteria  -Not Applicable   INTERVENTION: Provide Boost BID Provide Multivitamin with minerals daily Encourage po intake as tolerated  NUTRITION DIAGNOSIS: Unintentional wt loss related to poor appetite/medical condition as evidenced by 14% wt loss in less than 1 month.   Goal: Pt to meet >/= 90% of their estimated nutrition needs  Monitor:  PO intake Wt  Reason for Assessment: MST  78 y.o. male  Admitting Dx: Acute respiratory failure  ASSESSMENT: 78 yo male who was recently hospitalized for CAP (diagnosed 05/13/2012 and was discharged on 05/15/2012, readmitted 03/22 due to pleuritic chet pain and discharged 05/26/2012). Pt is now presenting with progressively worsening shortness of breath, initially started with exertion and has progressed to rest. Pt reports having a poor appetite and eating poorly for the past few weeks. Pt states he usually weighs 220 lbs; he had lost weight and then regained to 215 lbs 2 weeks ago and states he lost a lot of weight again since. Per wt history pt has lost 30 lbs (14% wt loss) in less than one month. Pt reports that he ate breakfast this morning but vomited shortly after- pt states RN is aware of this and administered appropriate medication. Pt only ordered coffee and juice for lunch; brought pt crackers, peanut butter, and Resource Breeze supplement at time of visit.   Height: Ht Readings from Last 1 Encounters:  06/05/12 5\' 9"  (1.753 m)    Weight: Wt Readings from Last 1 Encounters:  06/05/12 180 lb (81.647 kg)    Ideal Body Weight: 160 lbs  % Ideal Body Weight: 113%  Wt Readings from Last 10 Encounters:  06/05/12 180 lb (81.647 kg)  05/24/12 199 lb 1.6 oz (90.311 kg)  05/13/12 210 lb (95.255 kg)  12/01/11 205 lb (92.987 kg)  12/01/11 205 lb (92.987 kg)  10/12/11 197 lb 6 oz (89.529 kg)  10/12/11 197 lb 6 oz (89.529 kg)  06/27/11 222 lb (100.699 kg)    Usual  Body Weight: 220 lbs  % Usual Body Weight: 81%  BMI:  Body mass index is 26.57 kg/(m^2).  Estimated Nutritional Needs: Kcal: 2040-2450 Protein: 82-114 grams Fluid: 2.4 L  Skin: +2 RLE and LLE edema; intact  Diet Order: General  EDUCATION NEEDS: -No education needs identified at this time   Intake/Output Summary (Last 24 hours) at 06/06/12 1235 Last data filed at 06/06/12 0700  Gross per 24 hour  Intake    363 ml  Output      1 ml  Net    362 ml    Last BM: 4/9  Labs:   Recent Labs Lab 06/05/12 1700 06/06/12 0500  NA 139 140  K 4.0 3.6  CL 104 104  CO2 25 24  BUN 13 14  CREATININE 0.61 0.65  CALCIUM 8.8 8.6  GLUCOSE 93 92    CBG (last 3)  No results found for this basename: GLUCAP,  in the last 72 hours  Scheduled Meds: . enoxaparin (LOVENOX) injection  1 mg/kg Subcutaneous Q12H  . fenofibrate  160 mg Oral Daily  . ferrous sulfate  325 mg Oral Q breakfast  . folic acid  1 mg Oral Daily  . pantoprazole  40 mg Oral Daily  . sodium chloride  3 mL Intravenous Q12H  . warfarin  5 mg Oral ONCE-1800  . Warfarin - Pharmacist Dosing Inpatient   Does not apply q1800    Continuous Infusions:   Past Medical History  Diagnosis Date  . Kidney stones   . Hyperlipidemia   . GERD (gastroesophageal reflux disease)   . Carpal tunnel syndrome, bilateral   . Sleep apnea     uses cpap every night  . AAA (abdominal aortic aneurysm)     resolved by ct scan 09-2011  . Arthritis   . Anemia   . Barrett's esophagus   . Hypertension   . Pneumonia     Past Surgical History  Procedure Laterality Date  . Appendectomy    . Hernia repair    . Hand surgery      scar repaired lt hand  . Tonsillectomy    . Cystoscopy w/ stone manipulation  2001,2009  . Carpal tunnel release  10/12/2011    Procedure: CARPAL TUNNEL RELEASE;  Surgeon: Wyn Forster., MD;  Location: [facility];  Service: Orthopedics;  Laterality: Left;  . Carpal tunnel release   12/01/2011    Procedure: CARPAL TUNNEL RELEASE;  Surgeon: Wyn Forster., MD;  Location: [facility];  Service: Orthopedics;  Laterality: Right;  . Knee arthroscopy Left     Ian Malkin RD, LDN Inpatient Clinical Dietitian Pager: [phone] After Hours Pager: [phone]

## 2012-06-06 NOTE — Progress Notes (Signed)
   CARE MANAGEMENT NOTE 06/06/2012  Patient:  Tyler Norris, Tyler Norris   Account Number:  [id number]  Date Initiated:  06/06/2012  Documentation initiated by:  Jiles Crocker  Subjective/Objective Assessment:   ADMITTED WITH PE     Action/Plan:   PCP: Irving Copas, MD  LIVES AT HOME WITH SPOUSE; POSSILBY NEED HHC AT DISCHARGE; CM WILL CONTINUE TO FOLLOW FOR DCP   Anticipated DC Date:  06/10/2012   Anticipated DC Plan:  HOME W HOME HEALTH SERVICES      DC Planning Services  CM consult           Status of service:  In process, will continue to follow Medicare Important Message given?  NA - LOS <3 / Initial given by admissions (If response is "NO", the following Medicare IM given date fields will be blank) Per UR Regulation:  Reviewed for med. necessity/level of care/duration of stay Comments:  06/06/2012- B Tresa Jolley RN,BSN,MHA

## 2012-06-06 NOTE — Progress Notes (Signed)
[facility] PROGRESS NOTE  Tyler Norris ZOX:[id number] DOB: December 16, 1934 DOA: 06/05/2012 PCP: Irving Copas, MD  Brief Narrative: Pt is 78 yo male who was recently hospitalized for CAP (diagnosed 05/13/2012 and was discharged on 05/15/2012, readmitted 03/22 due to pleuritic chet pain and discharged 05/26/2012). Pt was treated on readmission with broad spectrum ABX Vancomycin and Zosyn for presumptive HCAP but spectrum was narrowed down as CAP was thought to be more likely. ABX stopped on 05/22/2012 which completed total of 10 days therapy. Pt is now presenting with progressively worsening shortness of breath, initially started with exertion and has progressed to rest. He explains he has never really gotten better since his last discharge. He denies any new chest discomfort, explains it is now somewhat of chronic in nature, he denies fevers, chills, no other systemic concerns. Pt denies any specific abdominal or urinary concerns.Of note, pt has had right sided diagnostic and therapeutic thoracentesis done on last admission (05/23/2012) and the findings were consistent with exudative effusion. In addition 2 D ECHO (05/14/2012) indicative of normal diastolic and systolic function, EF 55%. In ED, CT angio chest done and with new small PE. TR[facility] asked to admit for further evaluation. Vanc and Zosyn given x 1 dose. Heparin started in ED.  Assessment/Plan:  Acute respiratory failure  - this is likely multifactorial and secondary to acute PE imposed on bilateral pleural effusion in the setting of atelectasis. On admission images were reviewed with Dr. Herma Carson (PCCM), agreed with d/c ABX as pt is afebrile and non leukocytosis, pt relatively asymptomatic. Will repeat CXR in the morning and consider thoracentesis if effusions get worse or breathing is not improving. On Lovenox with transition to Coumadin, will ask pharmacy to assist with dosing. PCP in [facility] med, will call to set up appointment  for INR monitoring.   PE (pulmonary embolism)  - Lovenox as noted above, start also transition to Coumadin   History of CAP first diagnosed on 05/13/12 - discharged 3/19 - readmitted 3/22 due to pleuritic chest pain  - finished antibiotics on 05/22/12; afebrile and without leukocytosis.   Pleural effusion exudative  - monitor clinical status to decide if repeat thoracentesis needed   HTN (hypertension)  - stable on admission, pt takes no medications at home for BP control   Code Status: Full Family Communication: none  Disposition Plan: home 1-2 days  Consultants:  none  Procedures:  none  Antibiotics:  none  HPI/Subjective: - feels much better this morning.   Objective: Filed Vitals:   06/05/12 2112 06/05/12 2248 06/05/12 2314 06/06/12 0456  BP: 132/83  143/77 134/72  Pulse: 89  91 87  Temp: 98.2 F (36.8 C)  98.6 F (37 C) 98.1 F (36.7 C)  TempSrc: Oral  Oral Oral  Resp: 26  20 20   Height:  5\' 9"  (1.753 m)    Weight:  81.647 kg (180 lb)    SpO2: 93%  93% 95%    Intake/Output Summary (Last 24 hours) at 06/06/12 1006 Last data filed at 06/06/12 0700  Gross per 24 hour  Intake    363 ml  Output      1 ml  Net    362 ml   Filed Weights   06/05/12 2248  Weight: 81.647 kg (180 lb)    Exam:   General:  NAD  Cardiovascular: regular rate and rhythm, without MRG  Respiratory: good air movement, clear to auscultation throughout, no wheezing, ronchi or rales  Abdomen: soft, not tender  to palpation, positive bowel sounds  MSK: no peripheral edema  Neuro: CN 2-12 grossly intact, MS 5/5 in all 4  Data Reviewed: Basic Metabolic Panel:  Recent Labs Lab 06/05/12 1700 06/06/12 0500  NA 139 140  K 4.0 3.6  CL 104 104  CO2 25 24  GLUCOSE 93 92  BUN 13 14  CREATININE 0.61 0.65  CALCIUM 8.8 8.6   Liver Function Tests:  Recent Labs Lab 06/05/12 1700  AST 45*  ALT 33  ALKPHOS 108  BILITOT 0.4  PROT 6.2  ALBUMIN 2.4*    Recent Labs Lab  06/05/12 1700  LIPASE 28   No results found for this basename: AMMONIA,  in the last 168 hours CBC:  Recent Labs Lab 06/05/12 1700 06/06/12 0500  WBC 6.5 6.5  NEUTROABS 5.1  --   HGB 12.2* 12.0*  HCT 36.3* 35.9*  MCV 82.1 83.1  PLT 313 267   Cardiac Enzymes: No results found for this basename: CKTOTAL, CKMB, CKMBINDEX, TROPONINI,  in the last 168 hours BNP (last 3 results)  Recent Labs  06/05/12 1700  PROBNP 209.0   CBG: No results found for this basename: GLUCAP,  in the last 168 hours  No results found for this or any previous visit (from the past 240 hour(s)).   Studies: Dg Chest 2 View  06/05/2012  *RADIOLOGY REPORT*  Clinical Data: Chest pain and shortness of breath.  CHEST - 2 VIEW  Comparison: 05/23/2012  Findings: Heart size is normal.  There are small bilateral pleural effusions identified.  Atelectasis is noted within both lung bases. No focal airspace consolidation.  IMPRESSION:  Bilateral pleural effusions and bibasilar atelectasis.   Original Report Authenticated By: Signa Kell, M.D.    Ct Angio Chest Pe W/cm &/or Wo Cm  06/05/2012  *RADIOLOGY REPORT*  Clinical Data: Chest pain  CT ANGIOGRAPHY CHEST  Technique:  Multidetector CT imaging of the chest using the standard protocol during bolus administration of intravenous contrast. Multiplanar reconstructed images including MIPs were obtained and reviewed to evaluate the vascular anatomy.  Contrast: OMNIPAQUE IOHEXOL 350 MG/ML SOLN  Comparison: Chest x-ray earlier today 06/05/2012 at 07:43 p.m.; prior chest CT 05/13/2012  Findings:  Mediastinum: Unremarkable CT appearance of the thyroid gland.  No suspicious mediastinal or hilar adenopathy.  No soft tissue mediastinal mass.  The thoracic esophagus is unremarkable.  Heart/Vascular: Excellent opacification of the pulmonary arteries to the proximal subsegmental level.  Focal filling defect within the anterior segmental branch to the right upper lobe as well as  within the right middle lobar pulmonary artery proximally. Subsegmental filling defects identified within the left lower lobe. Conventional three-vessel arch anatomy.  Scattered atherosclerotic vascular calcifications.  No aneurysmal dilatation or dissection. Atherosclerotic calcification noted throughout the coronary arteries.  There is mild cardiomegaly.  Trace pericardial fluid versus thickening is similar to prior.  No evidence of right heart strain.  Lungs/Pleura: Moderate right and small left pleural effusions with associated atelectasis. Both effusions are water attenuation, however there are some convex margins in the upper aspect of the right effusion suggesting developing loculation.  No significant pleural enhancement.  Additionally, there is some relative decreased enhancement in the posteroinferior right lower lobe containing small air bronchograms concerning for a focus of potentially necrotic pneumonia. Improving left lower lobe consolidation.  Mild background centrilobular emphysema.  Upper Abdomen: Unremarkable limited imaging of the upper abdomen.  Bones: No acute fracture or aggressive appearing lytic or blastic osseous lesion.  IMPRESSION:  1.  Positive for relatively small burden pulmonary emboli involving a segmental right upper lobe pulmonary artery, the right middle lobe or pulmonary artery and subsegmental arteries in the left lower lobe.  2.  Focal decreased enhancement of the posteroinferior right lung parenchyma with internal air bronchograms concerning for a small focus of a potentially necrotic pneumonia.  3.  Moderate right pleural effusion with concave margin superiorly suggesting developing loculation.  No pleural enhancement to suggest empyema.  4.  Small layering left pleural effusion  5.  Additional ancillary findings as above without significant interval change compared to 05/13/2012.  Critical Value/emergent results were called by telephone at the time of interpretation on  06/05/2012 at 08:13 p.m. to Dr. Lorre Nick, who verbally acknowledged these results.   Original Report Authenticated By: Malachy Moan, M.D.     Scheduled Meds: . enoxaparin (LOVENOX) injection  1 mg/kg Subcutaneous Q12H  . fenofibrate  160 mg Oral Daily  . ferrous sulfate  325 mg Oral Q breakfast  . folic acid  1 mg Oral Daily  . pantoprazole  40 mg Oral Daily  . sodium chloride  3 mL Intravenous Q12H  . warfarin  5 mg Oral ONCE-1800  . Warfarin - Pharmacist Dosing Inpatient   Does not apply q1800   Continuous Infusions:   Principal Problem:   Acute respiratory failure Active Problems:   HTN (hypertension)   Pleural effusion exudative   PE (pulmonary embolism)  Time spent: 25  Pamella Pert, MD [facility] Pager [phone]. If 7 PM - 7 AM, please contact night-coverage at [email or web address], password [facility] 06/06/2012, 10:06 AM  LOS: 1 day

## 2012-06-07 ENCOUNTER — Inpatient Hospital Stay (HOSPITAL_COMMUNITY): Payer: Medicare Other

## 2012-06-07 ENCOUNTER — Inpatient Hospital Stay: Payer: Medicare Other | Admitting: Internal Medicine

## 2012-06-07 DIAGNOSIS — J90 Pleural effusion, not elsewhere classified: Secondary | ICD-10-CM

## 2012-06-07 DIAGNOSIS — I2699 Other pulmonary embolism without acute cor pulmonale: Principal | ICD-10-CM

## 2012-06-07 LAB — PROTIME-INR: Prothrombin Time: 18 seconds — ABNORMAL HIGH (ref 11.6–15.2)

## 2012-06-07 MED ORDER — WARFARIN SODIUM 2.5 MG PO TABS
2.5000 mg | ORAL_TABLET | Freq: Once | ORAL | Status: AC
  Administered 2012-06-07: 2.5 mg via ORAL
  Filled 2012-06-07: qty 1

## 2012-06-07 MED ORDER — PREDNISONE 20 MG PO TABS
30.0000 mg | ORAL_TABLET | Freq: Two times a day (BID) | ORAL | Status: DC
  Administered 2012-06-07 – 2012-06-10 (×6): 30 mg via ORAL
  Filled 2012-06-07 (×8): qty 1

## 2012-06-07 NOTE — Progress Notes (Signed)
[facility] PROGRESS NOTE  SALAHUDDIN ARISMENDEZ ZOX:[id number] DOB: [birth year] DOA: 06/05/2012 PCP: Irving Copas, MD  Brief Narrative: Pt is 78 yo male who was recently hospitalized for CAP (diagnosed 05/13/2012 and was discharged on 05/15/2012, readmitted 03/22 due to pleuritic chet pain and discharged 05/26/2012). Pt was treated on readmission with broad spectrum ABX Vancomycin and Zosyn for presumptive HCAP but spectrum was narrowed down as CAP was thought to be more likely. ABX stopped on 05/22/2012 which completed total of 10 days therapy. Pt is now presenting with progressively worsening shortness of breath, initially started with exertion and has progressed to rest. He explains he has never really gotten better since his last discharge. He denies any new chest discomfort, explains it is now somewhat of chronic in nature, he denies fevers, chills, no other systemic concerns. Pt denies any specific abdominal or urinary concerns.Of note, pt has had right sided diagnostic and therapeutic thoracentesis done on last admission (05/23/2012) and the findings were consistent with exudative effusion. In addition 2 D ECHO (05/14/2012) indicative of normal diastolic and systolic function, EF 55%. In ED, CT angio chest done and with new small PE. TRH asked to admit for further evaluation. Vanc and Zosyn given x 1 dose. Heparin started in ED.  Assessment/Plan:  Acute respiratory failure  - this is likely multifactorial and secondary to acute PE imposed on bilateral pleural effusion in the setting of atelectasis. On admission images were reviewed with Dr. Herma Carson (PCCM), agreed with d/c ABX as pt is afebrile and non leukocytosis, pt relatively asymptomatic. Will repeat CXR in the morning and consider thoracentesis if effusions get worse or breathing is not improving. On Lovenox with transition to Coumadin, will ask pharmacy to assist with dosing.  - called Barney Drain PCP and patient has an appointment on  Monday, 4/14 at 10:45 - have asked Pulmonary to evaluate patient, appreciate their input - I have talked over the phone with Dr. Nickola Major from Rheumatology and will further investigate with complement levels, SPEP, UPEP and CH 50. Lymphoma could have similar appearance, however patient underwent imaging chest abdomen pelvis as well as pleural fluid analysis. Will start steroids at this point and monitor clinically. Will need to see Rheum soon as an outpatient.   PE (pulmonary embolism)  - Lovenox as noted above, start also transition to Coumadin   History of CAP first diagnosed on 05/13/12 - discharged 3/19 - readmitted 3/22 due to pleuritic chest pain  - finished antibiotics on 05/22/12; afebrile and without leukocytosis.   Pleural effusion exudative  - monitor clinical status to decide if repeat thoracentesis needed; no needed thora per Pulm.  HTN (hypertension)  - stable on admission, pt takes no medications at home for BP control   Code Status: Full Family Communication: none  Disposition Plan: home 1-2 days  Consultants:  none  Procedures:  none  Antibiotics:  none  HPI/Subjective: - feels OK, not better not worse,  Objective: Filed Vitals:   06/06/12 0456 06/06/12 1336 06/06/12 2156 06/07/12 0500  BP: 134/72 130/73 132/54 120/62  Pulse: 87 90 90 98  Temp: 98.1 F (36.7 C) 98.3 F (36.8 C) 98.2 F (36.8 C) 98.9 F (37.2 C)  TempSrc: Oral Oral Oral Oral  Resp: 20 24 24 24   Height:      Weight:    81.3 kg (179 lb 3.7 oz)  SpO2: 95% 93% 93% 91%    Intake/Output Summary (Last 24 hours) at 06/07/12 1319 Last data filed at 06/07/12 0900  Gross per 24 hour  Intake    440 ml  Output      2 ml  Net    438 ml   Filed Weights   06/05/12 2248 06/07/12 0500  Weight: 81.647 kg (180 lb) 81.3 kg (179 lb 3.7 oz)    Exam:   General:  NAD  Cardiovascular: regular rate and rhythm, without MRG  Respiratory: good air movement, decreased breath sounds at the bases.    Abdomen: soft, not tender to palpation, positive bowel sounds  MSK: no peripheral edema  Neuro: CN 2-12 grossly intact, MS 5/5 in all 4  Data Reviewed: Basic Metabolic Panel:  Recent Labs Lab 06/05/12 1700 06/06/12 0500  NA 139 140  K 4.0 3.6  CL 104 104  CO2 25 24  GLUCOSE 93 92  BUN 13 14  CREATININE 0.61 0.65  CALCIUM 8.8 8.6   Liver Function Tests:  Recent Labs Lab 06/05/12 1700  AST 45*  ALT 33  ALKPHOS 108  BILITOT 0.4  PROT 6.2  ALBUMIN 2.4*    Recent Labs Lab 06/05/12 1700  LIPASE 28   CBC:  Recent Labs Lab 06/05/12 1700 06/06/12 0500  WBC 6.5 6.5  NEUTROABS 5.1  --   HGB 12.2* 12.0*  HCT 36.3* 35.9*  MCV 82.1 83.1  PLT 313 267   BNP (last 3 results)  Recent Labs  06/05/12 1700  PROBNP 209.0   Studies: Dg Chest 2 View  06/07/2012  *RADIOLOGY REPORT*  Clinical Data: Pleural effusions.  CHEST - 2 VIEW  Comparison: CT and plain films of the chest 06/05/2012.  Findings: Small to moderate bilateral pleural effusions show some increase since the prior study.  Associated basilar atelectasis is noted.  There is cardiomegaly.  IMPRESSION: Some increase in small to moderate bilateral pleural effusions with associated basilar atelectasis.   Original Report Authenticated By: Holley Dexter, M.D.    Dg Chest 2 View  06/05/2012  *RADIOLOGY REPORT*  Clinical Data: Chest pain and shortness of breath.  CHEST - 2 VIEW  Comparison: 05/23/2012  Findings: Heart size is normal.  There are small bilateral pleural effusions identified.  Atelectasis is noted within both lung bases. No focal airspace consolidation.  IMPRESSION:  Bilateral pleural effusions and bibasilar atelectasis.   Original Report Authenticated By: Signa Kell, M.D.    Ct Angio Chest Pe W/cm &/or Wo Cm  06/05/2012  *RADIOLOGY REPORT*  Clinical Data: Chest pain  CT ANGIOGRAPHY CHEST  Technique:  Multidetector CT imaging of the chest using the standard protocol during bolus administration of  intravenous contrast. Multiplanar reconstructed images including MIPs were obtained and reviewed to evaluate the vascular anatomy.  Contrast: OMNIPAQUE IOHEXOL 350 MG/ML SOLN  Comparison: Chest x-ray earlier today 06/05/2012 at 07:43 p.m.; prior chest CT 05/13/2012  Findings:  Mediastinum: Unremarkable CT appearance of the thyroid gland.  No suspicious mediastinal or hilar adenopathy.  No soft tissue mediastinal mass.  The thoracic esophagus is unremarkable.  Heart/Vascular: Excellent opacification of the pulmonary arteries to the proximal subsegmental level.  Focal filling defect within the anterior segmental branch to the right upper lobe as well as within the right middle lobar pulmonary artery proximally. Subsegmental filling defects identified within the left lower lobe. Conventional three-vessel arch anatomy.  Scattered atherosclerotic vascular calcifications.  No aneurysmal dilatation or dissection. Atherosclerotic calcification noted throughout the coronary arteries.  There is mild cardiomegaly.  Trace pericardial fluid versus thickening is similar to prior.  No evidence of right heart strain.  Lungs/Pleura: Moderate right and small left pleural effusions with associated atelectasis. Both effusions are water attenuation, however there are some convex margins in the upper aspect of the right effusion suggesting developing loculation.  No significant pleural enhancement.  Additionally, there is some relative decreased enhancement in the posteroinferior right lower lobe containing small air bronchograms concerning for a focus of potentially necrotic pneumonia. Improving left lower lobe consolidation.  Mild background centrilobular emphysema.  Upper Abdomen: Unremarkable limited imaging of the upper abdomen.  Bones: No acute fracture or aggressive appearing lytic or blastic osseous lesion.  IMPRESSION:  1.  Positive for relatively small burden pulmonary emboli involving a segmental right upper lobe  pulmonary artery, the right middle lobe or pulmonary artery and subsegmental arteries in the left lower lobe.  2.  Focal decreased enhancement of the posteroinferior right lung parenchyma with internal air bronchograms concerning for a small focus of a potentially necrotic pneumonia.  3.  Moderate right pleural effusion with concave margin superiorly suggesting developing loculation.  No pleural enhancement to suggest empyema.  4.  Small layering left pleural effusion  5.  Additional ancillary findings as above without significant interval change compared to 05/13/2012.  Critical Value/emergent results were called by telephone at the time of interpretation on 06/05/2012 at 08:13 p.m. to Dr. Lorre Nick, who verbally acknowledged these results.   Original Report Authenticated By: Malachy Moan, M.D.     Scheduled Meds: . enoxaparin (LOVENOX) injection  1 mg/kg Subcutaneous Q12H  . fenofibrate  160 mg Oral Daily  . ferrous sulfate  325 mg Oral Q breakfast  . folic acid  1 mg Oral Daily  . lactose free nutrition  237 mL Oral BID BM  . multivitamin with minerals  1 tablet Oral Daily  . pantoprazole  40 mg Oral Daily  . sodium chloride  3 mL Intravenous Q12H  . warfarin  2.5 mg Oral ONCE-1800  . Warfarin - Pharmacist Dosing Inpatient   Does not apply q1800   Continuous Infusions:   Principal Problem:   Acute respiratory failure Active Problems:   HTN (hypertension)   Pleural effusion exudative   PE (pulmonary embolism)  Time spent: 45  Pamella Pert, MD [facility] Pager [phone]. If 7 PM - 7 AM, please contact night-coverage at [email or web address], password [facility] 06/07/2012, 1:19 PM  LOS: 2 days

## 2012-06-07 NOTE — Progress Notes (Signed)
ANTICOAGULATION CONSULT NOTE - Follow-up Consult  Pharmacy Consult for Lovenox & Warfarin Indication: pulmonary embolus  Allergies  Allergen Reactions  . Lipitor (Atorvastatin Calcium)     Myalgias   Patient Measurements: Height: 5\' 9"  (175.3 cm) Weight: 179 lb 3.7 oz (81.3 kg) (standing scale) IBW/kg (Calculated) : 70.7 TBW 81.6 kg  Vital Signs: Temp: 98.9 F (37.2 C) (04/11 0500) Temp src: Oral (04/11 0500) BP: 120/62 mmHg (04/11 0500) Pulse Rate: 98 (04/11 0500)  Labs:  Recent Labs  06/05/12 1700 06/06/12 0500 06/07/12 0445  HGB 12.2* 12.0*  --   HCT 36.3* 35.9*  --   PLT 313 267  --   LABPROT  --  15.1 18.0*  INR  --  1.21 1.54*  CREATININE 0.61 0.65  --    Estimated Creatinine Clearance: 78.6 ml/min (by C-G formula based on Cr of 0.65).  Medical History: Past Medical History  Diagnosis Date  . Kidney stones   . Hyperlipidemia   . GERD (gastroesophageal reflux disease)   . Carpal tunnel syndrome, bilateral   . Sleep apnea     uses cpap every night  . AAA (abdominal aortic aneurysm)     resolved by ct scan 09-2011  . Arthritis   . Anemia   . Barrett's esophagus   . Hypertension   . Pneumonia    Medications:  Scheduled:  . [COMPLETED] coumadin book   Does not apply Once  . enoxaparin (LOVENOX) injection  1 mg/kg Subcutaneous Q12H  . fenofibrate  160 mg Oral Daily  . ferrous sulfate  325 mg Oral Q breakfast  . folic acid  1 mg Oral Daily  . lactose free nutrition  237 mL Oral BID BM  . multivitamin with minerals  1 tablet Oral Daily  . pantoprazole  40 mg Oral Daily  . sodium chloride  3 mL Intravenous Q12H  . [COMPLETED] warfarin  5 mg Oral ONCE-1800  . Warfarin - Pharmacist Dosing Inpatient   Does not apply q1800  . [DISCONTINUED] enoxaparin (LOVENOX) injection  90 mg Subcutaneous BID    Assessment: 78 yoM admit with weakness & SOB, previous admits 3/17 and 3/23 for pleuritic chest pain, treated for PNA. Chest CT: positive for PE, started on  Lovenox & Warfarin per pharmacy  D#3 Warfarin/Lovenox bridge  INR responding, now 1.54 after 2 doses coumadin.    CBC stable (from 4/10)  Renal fxn:  SCr ok, CrCl ~ 79 ml/min (from 4/10)  No bleeding noted/documented  Warfarin education completed 4/10  Goal of Therapy:  INR 2-3 Anti-Xa level 0.6-1.2 units/ml 4hrs after LMWH dose given Monitor platelets by anticoagulation protocol: Yes Plan 5 days minimum overlap of Lovenox/Warfarin. Require 48 hr with therapeutic INR during overlap.   Plan:  Reduce lovenox to 80mg  SQ q12, can consider dose change to 1.5mg /kg q24hr Warfarin 2.5mg  po x 1 tonight F/u CBC, renal fxn  Haynes Hoehn, PharmD 06/07/2012 8:07 AM  Pager: [phone]

## 2012-06-07 NOTE — Consult Note (Signed)
PULMONARY  / CRITICAL CARE MEDICINE  Name: Tyler Norris MRN: [id number] DOB: October 04, 1934    ADMISSION DATE:  06/05/2012 CONSULTATION DATE:  06/07/12  REFERRING MD :  Mauri Pole PRIMARY SERVICE:  [place]  CHIEF COMPLAINT:  dyspnea   HISTORY OF PRESENT ILLNESS:  78 year old never smoker presents with bilateral pleural effusions 30 pound weight loss. He was hospitalized and treated for community-acquired pneumonia from 05/13/12 to 3/ 19/14. CT and urine on 05/13/12 showed consolidation in left lower lobe and lingula with a small left parapneumonic effusion but no evidence of pulmonary embolism. CT abdomen showed small nonobstructive bilateral renal calculi and sigmoid colon diverticulosis and a left adrenal nodule consistent with adenoma. ANA was noted to be strongly positive at 1 in 1280 artifact was mildly positive at 18. Double-stranded DNA was positive, and levels were normal as was CCP antibody. ESR was 60 and anti-Smith antibody was negative  He was readmitted on 05/18/12 4 pleuritic chest pain and discharged again on 05/26/12. He underwent thoracentesis on 05/23/12 of 230 mL of monocytic exudative fluid. Protein was 3.3, LDH was 705, NT 25 white cells were noted mostly monocytes. He was readmitted on 06/05/12 for chest discomfort and shortness of breath - really unchanged from his last discharge .CT angiogram on 06/05/12 showed small subsegmental pulmonary emboli in the right upper lobe, right middle lobe and left lower lobe. A retired effusion was noted somewhat loculated and a small left effusion and was layering. Echo showed normal systolic function with an EF of 55% and no evidence of diastolic dysfunction.    PAST MEDICAL HISTORY :  Past Medical History  Diagnosis Date  . Kidney stones   . Hyperlipidemia   . GERD (gastroesophageal reflux disease)   . Carpal tunnel syndrome, bilateral   . Sleep apnea     uses cpap every night  . AAA (abdominal aortic aneurysm)     resolved by ct  scan 09-2011  . Arthritis   . Anemia   . Barrett's esophagus   . Hypertension   . Pneumonia    Past Surgical History  Procedure Laterality Date  . Appendectomy    . Hernia repair    . Hand surgery      scar repaired lt hand  . Tonsillectomy    . Cystoscopy w/ stone manipulation  2001,2009  . Carpal tunnel release  10/12/2011    Procedure: CARPAL TUNNEL RELEASE;  Surgeon: Wyn Forster., MD;  Location: [facility];  Service: Orthopedics;  Laterality: Left;  . Carpal tunnel release  12/01/2011    Procedure: CARPAL TUNNEL RELEASE;  Surgeon: Wyn Forster., MD;  Location: [facility];  Service: Orthopedics;  Laterality: Right;  . Knee arthroscopy Left    Prior to Admission medications   Medication Sig Start Date End Date Taking? Authorizing Provider  Bioflavonoid Products (BIOFLEX PO) Take 1 tablet by mouth daily.   Yes Historical Provider, MD  calcium-vitamin D (OSCAL WITH D) 500-200 MG-UNIT per tablet Take 1 tablet by mouth daily.   Yes Historical Provider, MD  esomeprazole (NEXIUM) 40 MG capsule Take 40 mg by mouth daily before breakfast.   Yes Historical Provider, MD  fenofibrate (TRICOR) 145 MG tablet Take 145 mg by mouth daily.   Yes Historical Provider, MD  ferrous sulfate 325 (65 FE) MG tablet Take 325 mg by mouth daily with breakfast.   Yes Historical Provider, MD  fish oil-omega-3 fatty acids 1000 MG capsule Take 1 g by  mouth daily.   Yes Historical Provider, MD  folic acid (FOLVITE) 1 MG tablet Take 1 mg by mouth daily.   Yes Historical Provider, MD  ibuprofen (ADVIL,MOTRIN) 200 MG tablet Take 400-800 mg by mouth every 8 (eight) hours as needed for pain.   Yes Historical Provider, MD  Multiple Vitamin (MULTIVITAMIN WITH MINERALS) TABS Take 1 tablet by mouth daily.   Yes Historical Provider, MD  rosuvastatin (CRESTOR) 5 MG tablet Take 5 mg by mouth daily.   Yes Historical Provider, MD  vitamin C (ASCORBIC ACID) 500 MG tablet Take 500 mg by  mouth daily.   Yes Historical Provider, MD   Allergies  Allergen Reactions  . Lipitor (Atorvastatin Calcium)     Myalgias    FAMILY HISTORY:  Family History  Problem Relation Age of Onset  . Heart disease Father   . Hyperlipidemia Father   . Hypertension Father   . Other Father     VARICOSE VEIN  . Peripheral vascular disease Father    SOCIAL HISTORY:  reports that he has never smoked. He has never used smokeless tobacco. He reports that he does not drink alcohol or use illicit drugs.  REVIEW OF SYSTEMS:   Constitutional: Negative for fever, chills, weight loss, malaise/fatigue and diaphoresis.  HENT: Negative for hearing loss, ear pain, nosebleeds, congestion, sore throat, neck pain, tinnitus and ear discharge.   Eyes: Negative for blurred vision, double vision, photophobia, pain, discharge and redness.  Respiratory: Negative for cough, hemoptysis, sputum production, shortness of breath has improved, wheezing and stridor.   Cardiovascular: Negative for chest pain, palpitations, orthopnea, claudication, leg swelling and PND.  Gastrointestinal: Negative for heartburn, nausea, vomiting, abdominal pain, diarrhea, constipation, blood in stool and melena.  Genitourinary: Negative for dysuria, urgency, frequency, hematuria and flank pain.  Musculoskeletal: Negative for myalgias, back pain, joint pain and falls.  Skin: Negative for itching and rash.  Neurological: Negative for dizziness, tingling, tremors, sensory change, speech change, focal weakness, seizures, loss of consciousness, weakness and headaches.  Endo/Heme/Allergies: Negative for environmental allergies and polydipsia. Does not bruise/bleed easily.  SUBJECTIVE:   VITAL SIGNS: Temp:  [98.2 F (36.8 C)-98.9 F (37.2 C)] 98.9 F (37.2 C) (04/11 0500) Pulse Rate:  [90-98] 98 (04/11 0500) Resp:  [24] 24 (04/11 0500) BP: (120-132)/(54-73) 120/62 mmHg (04/11 0500) SpO2:  [91 %-93 %] 91 % (04/11 0500) Weight:  [81.3 kg (179  lb 3.7 oz)] 81.3 kg (179 lb 3.7 oz) (04/11 0500)  PHYSICAL EXAMINATION: Gen. Pleasant, chronically ill appearing, well-nourished, in no distress, normal affect ENT - no lesions, no post nasal drip Neck: No JVD, no thyromegaly, no carotid bruits Lungs: no use of accessory muscles, no dullness to percussion, decreased on right  without rales or rhonchi  Cardiovascular: Rhythm regular, heart sounds  normal, no murmurs, no peripheral edema Abdomen: soft and non-tender, no hepatosplenomegaly, BS normal. Musculoskeletal: No deformities, no cyanosis or clubbing Neuro:  alert, non focal Skin:  Warm, no lesions/ rash    Recent Labs Lab 06/05/12 1700 06/06/12 0500  NA 139 140  K 4.0 3.6  CL 104 104  CO2 25 24  BUN 13 14  CREATININE 0.61 0.65  GLUCOSE 93 92    Recent Labs Lab 06/05/12 1700 06/06/12 0500  HGB 12.2* 12.0*  HCT 36.3* 35.9*  WBC 6.5 6.5  PLT 313 267   Dg Chest 2 View  06/07/2012  *RADIOLOGY REPORT*  Clinical Data: Pleural effusions.  CHEST - 2 VIEW  Comparison: CT and plain  films of the chest 06/05/2012.  Findings: Small to moderate bilateral pleural effusions show some increase since the prior study.  Associated basilar atelectasis is noted.  There is cardiomegaly.  IMPRESSION: Some increase in small to moderate bilateral pleural effusions with associated basilar atelectasis.   Original Report Authenticated By: Holley Dexter, M.D.    Dg Chest 2 View  06/05/2012  *RADIOLOGY REPORT*  Clinical Data: Chest pain and shortness of breath.  CHEST - 2 VIEW  Comparison: 05/23/2012  Findings: Heart size is normal.  There are small bilateral pleural effusions identified.  Atelectasis is noted within both lung bases. No focal airspace consolidation.  IMPRESSION:  Bilateral pleural effusions and bibasilar atelectasis.   Original Report Authenticated By: Signa Kell, M.D.    Ct Angio Chest Pe W/cm &/or Wo Cm  06/05/2012  *RADIOLOGY REPORT*  Clinical Data: Chest pain  CT ANGIOGRAPHY  CHEST  Technique:  Multidetector CT imaging of the chest using the standard protocol during bolus administration of intravenous contrast. Multiplanar reconstructed images including MIPs were obtained and reviewed to evaluate the vascular anatomy.  Contrast: OMNIPAQUE IOHEXOL 350 MG/ML SOLN  Comparison: Chest x-ray earlier today 06/05/2012 at 07:43 p.m.; prior chest CT 05/13/2012  Findings:  Mediastinum: Unremarkable CT appearance of the thyroid gland.  No suspicious mediastinal or hilar adenopathy.  No soft tissue mediastinal mass.  The thoracic esophagus is unremarkable.  Heart/Vascular: Excellent opacification of the pulmonary arteries to the proximal subsegmental level.  Focal filling defect within the anterior segmental branch to the right upper lobe as well as within the right middle lobar pulmonary artery proximally. Subsegmental filling defects identified within the left lower lobe. Conventional three-vessel arch anatomy.  Scattered atherosclerotic vascular calcifications.  No aneurysmal dilatation or dissection. Atherosclerotic calcification noted throughout the coronary arteries.  There is mild cardiomegaly.  Trace pericardial fluid versus thickening is similar to prior.  No evidence of right heart strain.  Lungs/Pleura: Moderate right and small left pleural effusions with associated atelectasis. Both effusions are water attenuation, however there are some convex margins in the upper aspect of the right effusion suggesting developing loculation.  No significant pleural enhancement.  Additionally, there is some relative decreased enhancement in the posteroinferior right lower lobe containing small air bronchograms concerning for a focus of potentially necrotic pneumonia. Improving left lower lobe consolidation.  Mild background centrilobular emphysema.  Upper Abdomen: Unremarkable limited imaging of the upper abdomen.  Bones: No acute fracture or aggressive appearing lytic or blastic osseous lesion.   IMPRESSION:  1.  Positive for relatively small burden pulmonary emboli involving a segmental right upper lobe pulmonary artery, the right middle lobe or pulmonary artery and subsegmental arteries in the left lower lobe.  2.  Focal decreased enhancement of the posteroinferior right lung parenchyma with internal air bronchograms concerning for a small focus of a potentially necrotic pneumonia.  3.  Moderate right pleural effusion with concave margin superiorly suggesting developing loculation.  No pleural enhancement to suggest empyema.  4.  Small layering left pleural effusion  5.  Additional ancillary findings as above without significant interval change compared to 05/13/2012.  Critical Value/emergent results were called by telephone at the time of interpretation on 06/05/2012 at 08:13 p.m. to Dr. Lorre Nick, who verbally acknowledged these results.   Original Report Authenticated By: Malachy Moan, M.D.     ASSESSMENT / PLAN:  Bilateral effusions, loculated on the right and layering on left - exudative monocytic Small segmental pulmonary emboli  Recent Community acquired pneumonia, now  resolved Strongly positive ANA and double-stranded DNA suggestive of underlying collagen vascular disease  - Would  obtain rheumatology input and if agreeable,  start prednisone 40 mg -Would obtain venous duplex of both lower extremities and transition to Coumadin with Lovenox bridge -Followup imaging in 3-4 weeks for resolution of effusions, outpatient followup was arranged - his wife did express to me about her inability to care for him at home and care management will be involved -Reason for his persistent vomiting and weight loss is unclear at this time. No obvious malignancy has been apparent on his imaging studies.   Plan was discussed with the hospitalist service [employer]  Pulmonary and Critical Care Medicine [facility] Pager: [phone]  06/07/2012, 12:24 PM

## 2012-06-07 NOTE — Progress Notes (Signed)
*  Preliminary Results* Bilateral lower extremity venous duplex completed. Bilateral lower extremities are negative for deep vein thrombosis. No Baker's cyst bilaterally.  06/07/2012 12:55 PM Gertie Fey, RDMS, RDCS

## 2012-06-08 LAB — C4 COMPLEMENT: Complement C4, Body Fluid: 20 mg/dL (ref 10–40)

## 2012-06-08 LAB — PROTIME-INR
INR: 1.85 — ABNORMAL HIGH (ref 0.00–1.49)
Prothrombin Time: 20.7 seconds — ABNORMAL HIGH (ref 11.6–15.2)

## 2012-06-08 LAB — C3 COMPLEMENT: C3 Complement: 119 mg/dL (ref 90–180)

## 2012-06-08 MED ORDER — WARFARIN SODIUM 2.5 MG PO TABS
2.5000 mg | ORAL_TABLET | Freq: Once | ORAL | Status: AC
  Administered 2012-06-08: 2.5 mg via ORAL
  Filled 2012-06-08: qty 1

## 2012-06-08 NOTE — Progress Notes (Signed)
Pt's dgt Tyler Norris is at bedside and pt verbalized consent to inform her of medical condition. Pt states his daughter is more capable to deal with his medical situation than his wife, as his wife's health is failing. However, his wife is his POA.  Pt's dgt states she and pt agree that even though he is stronger, they still feel like he will need care that his wife is unable to provide. Made aware the PT did eval pt this afternoon, and MD will assess pt again in the morning. Pt has responded well to predisone, pain and nausea meds. He is up ambulating without assistance and appetite has slowly improved. Will page MD to call dgt at his earliest convenience Tyler Norris [phone]),

## 2012-06-08 NOTE — Evaluation (Signed)
Physical Therapy Evaluation Patient Details Name: Tyler Norris MRN: [id number] DOB: 1934-04-07 Today's Date: 06/08/2012 Time: 1355-1405 PT Time Calculation (min): 10 min  PT Assessment / Plan / Recommendation Clinical Impression  Pt admitted with acute respiratory failure secondary to acute PE imposed on bilateral pleural effusion in the setting of atelectasis.  Pt also with 2 recent admission at [facility].  Pt currently feeling much better and modified independent level.  Pt reports he feels back to baseline.  SaO2 at rest on room air was 98% and during ambulation on room air 89%.  Pt denies SOB.  No further PT needs identified at this time.    PT Assessment  Patent does not need any further PT services    Follow Up Recommendations  No PT follow up    Does the patient have the potential to tolerate intense rehabilitation      Barriers to Discharge        Equipment Recommendations  None recommended by PT    Recommendations for Other Services     Frequency      Precautions / Restrictions Precautions Precautions: Fall   Pertinent Vitals/Pain SaO2 room air at rest 98% SaO2 during ambulation on room air 89%     Mobility  Bed Mobility Bed Mobility: Not assessed Details for Bed Mobility Assistance: patient up in recliner Transfers Transfers: Sit to Stand;Stand to Sit Sit to Stand: 6: Modified independent (Device/Increase time);With upper extremity assist;From chair/3-in-1 Stand to Sit: 6: Modified independent (Device/Increase time);With upper extremity assist;To toilet Ambulation/Gait Ambulation/Gait Assistance: 6: Modified independent (Device/Increase time) Ambulation Distance (Feet): 1000 Feet Assistive device: None Ambulation/Gait Assistance Details: no LOB or unsteady gait, increased toe out bilaterally observed (due to collapsed arches per previous admission note) Gait Pattern: Wide base of support General Gait Details: pt feels ambulation back to baseline, good strength  in LEs    Exercises     PT Diagnosis:    PT Problem List:   PT Treatment Interventions:     PT Goals    Visit Information  Last PT Received On: 06/08/12 Assistance Needed: +1    Subjective Data  Subjective: I won't work up to being SOB. (discussing pace with ambulation)   Prior Functioning  Home Living Lives With: Spouse Available Help at Discharge: Available 24 hours/day;Family Type of Home: House Home Access: Level entry Home Layout: One level Bathroom Shower/Tub: Engineer, manufacturing systems: Standard Home Adaptive Equipment: None Additional Comments: Pt states he has RW and SPC available to him if necessary Prior Function Level of Independence: Independent Able to Take Stairs?: Yes Driving: Yes Vocation: Part time employment Communication Communication: No difficulties    Cognition  Cognition Overall Cognitive Status: Appears within functional limits for tasks assessed/performed Arousal/Alertness: Awake/alert Orientation Level: Appears intact for tasks assessed Behavior During Session: [facility] for tasks performed    Extremity/Trunk Assessment Right Lower Extremity Assessment RLE ROM/Strength/Tone: [facility] for tasks assessed Left Lower Extremity Assessment LLE ROM/Strength/Tone: [facility] for tasks assessed   Balance    End of Session PT - End of Session Activity Tolerance: Patient tolerated treatment well Patient left: with family/visitor present;Other (comment) (in bathroom, spouse present, RN aware)  GP     Hanish Laraia,KATHrine E 06/08/2012, 2:55 PM Zenovia Jarred, PT, DPT 06/08/2012 Pager: [phone]

## 2012-06-08 NOTE — Progress Notes (Signed)
Pt c/o's of nausea, and pain in abd, but did not vomit. He states that he has had this chronic abd pain for months, states he has been vomiting daily. He states his appetite has been very poor, and he has had a 30 lb wt loss. Pain and nausea medication given. Will observe.

## 2012-06-08 NOTE — Progress Notes (Signed)
ANTICOAGULATION CONSULT NOTE - Follow-up Consult  Pharmacy Consult for Lovenox & Warfarin Indication: pulmonary embolus  Allergies  Allergen Reactions  . Lipitor (Atorvastatin Calcium)     Myalgias   Patient Measurements: Height: 5\' 9"  (175.3 cm) Weight: 179 lb 12.8 oz (81.557 kg) IBW/kg (Calculated) : 70.7 TBW 81.6 kg  Vital Signs: Temp: 98.5 F (36.9 C) (04/12 0504) Temp src: Oral (04/12 0504) BP: 112/76 mmHg (04/12 0504) Pulse Rate: 96 (04/12 0504)  Labs:  Recent Labs  06/05/12 1700 06/06/12 0500 06/07/12 0445 06/08/12 0445  HGB 12.2* 12.0*  --   --   HCT 36.3* 35.9*  --   --   PLT 313 267  --   --   LABPROT  --  15.1 18.0* 20.7*  INR  --  1.21 1.54* 1.85*  CREATININE 0.61 0.65  --   --    Estimated Creatinine Clearance: 78.6 ml/min (by C-G formula based on Cr of 0.65).  Medications:  Scheduled:  . enoxaparin (LOVENOX) injection  1 mg/kg Subcutaneous Q12H  . fenofibrate  160 mg Oral Daily  . ferrous sulfate  325 mg Oral Q breakfast  . folic acid  1 mg Oral Daily  . lactose free nutrition  237 mL Oral BID BM  . multivitamin with minerals  1 tablet Oral Daily  . pantoprazole  40 mg Oral Daily  . predniSONE  30 mg Oral BID WC  . sodium chloride  3 mL Intravenous Q12H  . [COMPLETED] warfarin  2.5 mg Oral ONCE-1800  . Warfarin - Pharmacist Dosing Inpatient   Does not apply q1800   Assessment: 69 yoM admit with weakness & SOB, previous admits 3/17 and 3/23 for pleuritic chest pain, treated for PNA. Chest CT: positive for PE, started on Lovenox & Warfarin per pharmacy.   D#4 Warfarin/Lovenox bridge  INR responding, now 1.85 after 5,5, 2.5mg  Coumadin.    CBC stable (from 4/10)  No bleeding noted/documented  LE dopplers negative for DVT  Warfarin education completed 4/10  Goal of Therapy:  INR 2-3 Anti-Xa level 0.6-1.2 units/ml 4hrs after LMWH dose given Monitor platelets by anticoagulation protocol: Yes Plan 5 days minimum overlap of  Lovenox/Warfarin. Require 48 hr with therapeutic INR during overlap.   Plan:  Lovenox 80mg  SQ q12,  Warfarin 2.5mg  po x 1 tonight Daily INR, CBC  Otho Bellows PharmD Pager [phone] 06/08/2012, 11:00 AM

## 2012-06-08 NOTE — Progress Notes (Signed)
Pt alert and seemed to being having better morning than yesterday. He ate small amt of breakfast and denied nausea or pain. He got up and went to BR, had small formed BM. He then developed pain in his abd, that radiated toward his right side. He states this pain is the same chronic pain, however, he did not vomit. Morphine 1mg  and Zofran given.

## 2012-06-08 NOTE — Progress Notes (Addendum)
[facility] PROGRESS NOTE  TORENCE Norris ZOX:[id number] DOB: 1934-08-03 DOA: 06/05/2012 PCP: Tyler Copas, MD  Brief Narrative: Pt is 78 yo male who was recently hospitalized for CAP (diagnosed 05/13/2012 and was discharged on 05/15/2012, readmitted 03/22 due to pleuritic chet pain and discharged 05/26/2012). Pt was treated on readmission with broad spectrum ABX Vancomycin and Zosyn for presumptive HCAP but spectrum was narrowed down as CAP was thought to be more likely. ABX stopped on 05/22/2012 which completed total of 10 days therapy. Pt is now presenting with progressively worsening shortness of breath, initially started with exertion and has progressed to rest. He explains he has never really gotten better since his last discharge. He denies any new chest discomfort, explains it is now somewhat of chronic in nature, he denies fevers, chills, no other systemic concerns. Pt denies any specific abdominal or urinary concerns.Of note, pt has had right sided diagnostic and therapeutic thoracentesis done on last admission (05/23/2012) and the findings were consistent with exudative effusion. In addition 2 D ECHO (05/14/2012) indicative of normal diastolic and systolic function, EF 55%. In ED, CT angio chest done and with new small PE. TR[facility] asked to admit for further evaluation. Vanc and Zosyn given x 1 dose. Heparin started in ED.  Assessment/Plan:  Acute respiratory failure  - this is likely multifactorial and secondary to acute PE imposed on bilateral pleural effusion in the setting of atelectasis. On admission images were reviewed with Dr. Herma Norris (PCCM), agreed with d/c ABX as pt is afebrile and non leukocytosis, pt relatively asymptomatic.  - on Lovenox with transition to Coumadin, will ask pharmacy to assist with dosing.  - called Tyler Norris PCP and patient has an appointment on Monday, 4/14 at 10:45 for INR check - I have talked on 4/12 over the phone with Dr. Nickola Norris from Rheumatology  and will further investigate with complement levels, SPEP, UPEP and CH 50. Lymphoma could have similar appearance, however patient underwent imaging chest abdomen pelvis as well as pleural fluid analysis. Will start steroids at this point and monitor clinically. Will need to see Rheum soon as an outpatient.  - feeling better this morning, appreciates that his breathing improved  PE (pulmonary embolism)  - Lovenox as noted above, start also transition to Coumadin   History of CAP first diagnosed on 05/13/12 - discharged 3/19 - readmitted 3/22 due to pleuritic chest pain  - finished antibiotics on 05/22/12; afebrile and without leukocytosis.   Pleural effusion exudative  - monitor clinical status to decide if repeat thoracentesis needed; no needed thora per Pulm.  HTN (hypertension)  - stable on admission, pt takes no medications at home for BP control   Code Status: Full Family Communication: I talked to his daughter over the phone last night. She expressed concerns that given the fact that she this is his third hospitalization he might not do well at home. Our physical therapist evaluate the patient, and recommended no follow up. She is worried that he is not going to be able to take care of himself. He lives with his wife and per their daughter, it seems like his wife is not too strong. Patient insists that he is to be discharged home, however very concerned about his need for Lovenox and inability to do this as an outpatient. He would be good to be discharged tomorrow morning, and if his INR is therapeutic he can go home off of Lovenox. Disposition Plan: home 4/13  Consultants:  Pulmonology  Procedures:  none  Antibiotics:  none  HPI/Subjective: - continues to feel better  Objective: Filed Vitals:   06/07/12 1410 06/07/12 2122 06/08/12 0504 06/08/12 0658  BP: 112/59 120/68 112/76   Pulse: 107 103 96   Temp: 98.6 F (37 C) 98.5 F (36.9 C) 98.5 F (36.9 C)   TempSrc: Oral  Oral Oral   Resp: 22 20 20    Height:      Weight:    81.557 kg (179 lb 12.8 oz)  SpO2: 94% 95%      Intake/Output Summary (Last 24 hours) at 06/08/12 1210 Last data filed at 06/08/12 0925  Gross per 24 hour  Intake    883 ml  Output    400 ml  Net    483 ml   Filed Weights   06/05/12 2248 06/07/12 0500 06/08/12 0658  Weight: 81.647 kg (180 lb) 81.3 kg (179 lb 3.7 oz) 81.557 kg (179 lb 12.8 oz)    Exam:   General:  NAD  Cardiovascular: regular rate and rhythm, without MRG  Respiratory: good air movement, decreased breath sounds at the bases.   Abdomen: soft, not tender to palpation, positive bowel sounds  MSK: no peripheral edema  Neuro: CN 2-12 grossly intact, MS 5/5 in all 4  Data Reviewed: Basic Metabolic Panel:  Recent Labs Lab 06/05/12 1700 06/06/12 0500  NA 139 140  K 4.0 3.6  CL 104 104  CO2 25 24  GLUCOSE 93 92  BUN 13 14  CREATININE 0.61 0.65  CALCIUM 8.8 8.6   Liver Function Tests:  Recent Labs Lab 06/05/12 1700  AST 45*  ALT 33  ALKPHOS 108  BILITOT 0.4  PROT 6.2  ALBUMIN 2.4*    Recent Labs Lab 06/05/12 1700  LIPASE 28   CBC:  Recent Labs Lab 06/05/12 1700 06/06/12 0500  WBC 6.5 6.5  NEUTROABS 5.1  --   HGB 12.2* 12.0*  HCT 36.3* 35.9*  MCV 82.1 83.1  PLT 313 267   BNP (last 3 results)  Recent Labs  06/05/12 1700  PROBNP 209.0   Studies: Dg Chest 2 View  06/07/2012  *RADIOLOGY REPORT*  Clinical Data: Pleural effusions.  CHEST - 2 VIEW  Comparison: CT and plain films of the chest 06/05/2012.  Findings: Small to moderate bilateral pleural effusions show some increase since the prior study.  Associated basilar atelectasis is noted.  There is cardiomegaly.  IMPRESSION: Some increase in small to moderate bilateral pleural effusions with associated basilar atelectasis.   Original Report Authenticated By: Tyler Norris, M.D.     Scheduled Meds: . enoxaparin (LOVENOX) injection  1 mg/kg Subcutaneous Q12H  .  fenofibrate  160 mg Oral Daily  . ferrous sulfate  325 mg Oral Q breakfast  . folic acid  1 mg Oral Daily  . lactose free nutrition  237 mL Oral BID BM  . multivitamin with minerals  1 tablet Oral Daily  . pantoprazole  40 mg Oral Daily  . predniSONE  30 mg Oral BID WC  . sodium chloride  3 mL Intravenous Q12H  . warfarin  2.5 mg Oral ONCE-1800  . Warfarin - Pharmacist Dosing Inpatient   Does not apply q1800   Continuous Infusions:   Principal Problem:   Acute respiratory failure Active Problems:   HTN (hypertension)   Pleural effusion exudative   PE (pulmonary embolism)  Time spent: 25  Pamella Pert, MD [facility] Pager [phone]. If 7 PM - 7 AM, please contact night-coverage at [email or web address], password [facility] 06/08/2012,  12:10 PM  LOS: 3 days

## 2012-06-09 DIAGNOSIS — M359 Systemic involvement of connective tissue, unspecified: Secondary | ICD-10-CM

## 2012-06-09 LAB — PROTIME-INR
INR: 1.89 — ABNORMAL HIGH (ref 0.00–1.49)
Prothrombin Time: 21 seconds — ABNORMAL HIGH (ref 11.6–15.2)

## 2012-06-09 MED ORDER — WARFARIN SODIUM 5 MG PO TABS
5.0000 mg | ORAL_TABLET | Freq: Once | ORAL | Status: AC
  Administered 2012-06-09: 5 mg via ORAL
  Filled 2012-06-09: qty 1

## 2012-06-09 NOTE — Progress Notes (Signed)
OT Screen Note  Patient Details Name: Tyler Norris MRN: [id number] DOB: 1934-09-22   Cancelled Treatment:     Pt screened for OT.  He was mod I with PT yesterday and NT reports he completed adl by himself today.  Pt has a walk in shower and standard commode at home. He doesn't feel he will have any difficulty with this.  Wife is home almost all of the time.    Maekayla Giorgio 06/09/2012, 11:12 AM Marica Otter, OTR/L [phone] 06/09/2012

## 2012-06-09 NOTE — Progress Notes (Signed)
ANTICOAGULATION CONSULT NOTE - Follow-up Consult  Pharmacy Consult for Lovenox & Warfarin Indication: pulmonary embolus  Allergies  Allergen Reactions  . Lipitor (Atorvastatin Calcium)     Myalgias   Patient Measurements: Height: 5\' 9"  (175.3 cm) Weight: 181 lb 10.5 oz (82.4 kg) IBW/kg (Calculated) : 70.7 TBW 81.6 kg  Vital Signs: Temp: 97.8 F (36.6 C) (04/13 0451) Temp src: Oral (04/13 0451) BP: 139/86 mmHg (04/13 0451) Pulse Rate: 99 (04/13 0451)  Labs:  Recent Labs  06/07/12 0445 06/08/12 0445 06/09/12 0500  LABPROT 18.0* 20.7* 21.0*  INR 1.54* 1.85* 1.89*   Estimated Creatinine Clearance: 78.6 ml/min (by C-G formula based on Cr of 0.65).  Medications:  Scheduled:  . enoxaparin (LOVENOX) injection  1 mg/kg Subcutaneous Q12H  . fenofibrate  160 mg Oral Daily  . ferrous sulfate  325 mg Oral Q breakfast  . folic acid  1 mg Oral Daily  . lactose free nutrition  237 mL Oral BID BM  . multivitamin with minerals  1 tablet Oral Daily  . pantoprazole  40 mg Oral Daily  . predniSONE  30 mg Oral BID WC  . sodium chloride  3 mL Intravenous Q12H  . [COMPLETED] warfarin  2.5 mg Oral ONCE-1800  . Warfarin - Pharmacist Dosing Inpatient   Does not apply q1800   Assessment: 11 yoM admit with weakness & SOB, previous admits 3/17 and 3/23 for pleuritic chest pain, treated for PNA. Chest CT: positive for PE, also note pleural effusion/thoracentesis last admit. Begin Lovenox & Warfarin per pharmacy.   D#5 Warfarin/Lovenox bridge  INR responding, now 1.89 after 5, 5, 2.5, 2.5mg  Coumadin.    CBC stable (from 4/10)  No bleeding noted/documented  LE dopplers negative for DVT  Warfarin education completed 4/10  Appt set up with PCP Monday 4/14 for INR, anticipate daily Warfarin needs at 5mg  daily.   Goal of Therapy:  INR 2-3 Anti-Xa level 0.6-1.2 units/ml 4hrs after LMWH dose given Monitor platelets by anticoagulation protocol: Yes Plan 5 days minimum overlap of  Lovenox/Warfarin. Require 48 hr with therapeutic INR during overlap.   Plan:  Lovenox 80mg  SQ q12,  Warfarin 5mg  po x this morning Daily INR, CBC  Otho Bellows PharmD Pager [phone] 06/09/2012, 8:06 AM

## 2012-06-10 MED ORDER — WARFARIN SODIUM 5 MG PO TABS: 5.0000 mg | ORAL_TABLET | Freq: Every day | ORAL | Status: DC

## 2012-06-10 MED ORDER — ENOXAPARIN SODIUM 150 MG/ML ~~LOC~~ SOLN
1.5000 mg/kg | Freq: Once | SUBCUTANEOUS | Status: AC
  Administered 2012-06-10: 125 mg via SUBCUTANEOUS
  Filled 2012-06-10: qty 1

## 2012-06-10 MED ORDER — WARFARIN SODIUM 5 MG PO TABS
5.0000 mg | ORAL_TABLET | Freq: Once | ORAL | Status: DC
  Filled 2012-06-10: qty 1

## 2012-06-10 MED ORDER — PREDNISONE 10 MG PO TABS: 40.0000 mg | ORAL_TABLET | Freq: Every day | ORAL | Status: DC

## 2012-06-10 MED ORDER — ONDANSETRON HCL 4 MG PO TABS: 4.0000 mg | ORAL_TABLET | Freq: Two times a day (BID) | ORAL | Status: DC | PRN

## 2012-06-10 NOTE — Progress Notes (Signed)
Patient discharged home with wife, discharge instructions given and explained to patient/wife and they verbalized understanding, denies any pain/distress. Skin intact, no wound. Transported to the car by staff via wheelchair, accompanied home by wife.

## 2012-06-10 NOTE — Progress Notes (Signed)
Patient vomited about an hour after eating breakfast, zofran given and Dr.Gherghe notified. Will continue to assess patient.

## 2012-06-10 NOTE — Progress Notes (Signed)
ANTICOAGULATION CONSULT NOTE - Follow-up  Pharmacy Consult for Lovenox & Warfarin Indication: pulmonary embolus  Allergies  Allergen Reactions  . Lipitor (Atorvastatin Calcium)     Myalgias   Patient Measurements: Height: 5\' 9"  (175.3 cm) Weight: 183 lb 10.3 oz (83.3 kg) IBW/kg (Calculated) : 70.7 TBW 81.6 kg  Vital Signs: Temp: 97.5 F (36.4 C) (04/14 0645) Temp src: Oral (04/14 0645) BP: 150/82 mmHg (04/14 0645) Pulse Rate: 93 (04/14 0645)  Labs:  Recent Labs  06/08/12 0445 06/09/12 0500 06/10/12 0431  LABPROT 20.7* 21.0* 24.2*  INR 1.85* 1.89* 2.29*   Estimated Creatinine Clearance: 78.6 ml/min (by C-G formula based on Cr of 0.65).  Medications:  Scheduled:  . fenofibrate  160 mg Oral Daily  . ferrous sulfate  325 mg Oral Q breakfast  . folic acid  1 mg Oral Daily  . lactose free nutrition  237 mL Oral BID BM  . multivitamin with minerals  1 tablet Oral Daily  . pantoprazole  40 mg Oral Daily  . predniSONE  30 mg Oral BID WC  . sodium chloride  3 mL Intravenous Q12H  . Warfarin - Pharmacist Dosing Inpatient   Does not apply q1800  . [DISCONTINUED] enoxaparin (LOVENOX) injection  1 mg/kg Subcutaneous Q12H   Assessment: 78 yo M admit with weakness & SOB, previous admits 3/17 and 3/23 for pleuritic chest pain, treated for PNA. Chest CT is positive for PE, also note pleural effusion/thoracentesis last admit. Lovenox & Warfarin per pharmacy started on 4/9.   Today is Day #6 Warfarin/Lovenox bridge  INR is therapeutic today for the first time    CBC stable (from 4/10)  No bleeding noted/documented  LE dopplers negative for DVT  Warfarin education completed 4/10  Appt set up with PCP Monday 4/14 for INR, anticipate daily Warfarin needs at 5mg  daily.   Goal of Therapy:  INR 2-3 Anti-Xa level 0.6-1.2 units/ml 4hrs after LMWH dose given Monitor platelets by anticoagulation protocol: Yes Plan 5 days minimum overlap of Lovenox/Warfarin. Require 48 hr with  therapeutic INR during overlap.   Plan:  1) Warfarin 5mg  PO x1 2) V.O Dr. Elvera Lennox - Lovenox 1.5mg /kg x1 3) Suggest sending pt home on warfarin 5mg  daily  Darrol Angel, PharmD Pager: [phone] 06/10/2012, 11:21 AM

## 2012-06-11 LAB — UIFE/LIGHT CHAINS/TP QN, 24-HR UR
Albumin, U: DETECTED
Alpha 2, Urine: DETECTED — AB
Beta, Urine: DETECTED — AB
Free Lambda Lt Chains,Ur: 1.71 mg/dL — ABNORMAL HIGH (ref 0.02–0.67)
Gamma Globulin, Urine: DETECTED — AB

## 2012-06-11 LAB — PROTEIN ELECTROPHORESIS, SERUM
Alpha-2-Globulin: 17.6 % — ABNORMAL HIGH (ref 7.1–11.8)
Beta 2: 4.9 % (ref 3.2–6.5)
Gamma Globulin: 21.3 % — ABNORMAL HIGH (ref 11.1–18.8)
M-Spike, %: NOT DETECTED g/dL

## 2012-06-11 LAB — COMPLEMENT, TOTAL: Compl, Total (CH50): 56 U/mL (ref 31–60)

## 2012-06-11 NOTE — Progress Notes (Addendum)
Physician Discharge Summary  Tyler Norris AVW:[id number] DOB: 04/27/1934 DOA: 06/05/2012  PCP: Irving Copas, MD  Admit date: 06/05/2012 Discharge date: 06/11/2012  Time spent: 45 minutes  Recommendations for Outpatient Follow-up:  1. As below, patient is to followup with pulmonary, rheumatology, INR clinic, and his primary care provider.  Discharge Diagnoses:  Principal Problem:   Acute respiratory failure Active Problems:   HTN (hypertension)   Pleural effusion exudative   PE (pulmonary embolism)  Discharge Condition: stable  Diet recommendation: heart healthy  Filed Weights   06/08/12 0658 06/09/12 0451 06/10/12 0645  Weight: 81.557 kg (179 lb 12.8 oz) 82.4 kg (181 lb 10.5 oz) 83.3 kg (183 lb 10.3 oz)   History of present illness:  78 yo male who was recently hospitalized for CAP (diagnosed 05/13/2012 and was discharged on 05/15/2012, readmitted 03/22 due to pleuritic chet pain and discharged 05/26/2012). Pt was treated on readmission with broad spectrum ABX Vancomycin and Zosyn for presumptive HCAP but spectrum was narrowed down as CAP was thought to be more likely. ABX stopped on 05/22/2012 which completed total of 10 days therapy. Pt is now presenting with progressively worsening shortness of breath, initially started with exertion and has progressed to rest. He explains he has never really gotten better since his last discharge. He denies any new chest discomfort, explains it is now somewhat of chronic in nature, he denies fevers, chills, no other systemic concerns. Pt denies any specific abdominal or urinary concerns.Of note, pt has had right sided diagnostic and therapeutic thoracentesis done on last admission (05/23/2012) and the findings were consistent with exudative effusion. In addition 2 D ECHO (05/14/2012) indicative of normal diastolic and systolic function, EF 55%  In ED, CT angio chest done and with new small PE. TRH asked to admit for further evaluation. Vanc  and Zosyn given x 1 dose. Heparin started in ED.  Hospital Course:  Acute respiratory failure This is likely multifactorial and secondary to acute PE imposed on bilateral pleural effusion in the setting of atelectasis. This was his third hospitalization in the last month, and was treated for pneumonia during the first hospitalization. In the emergency room patient was started on antibiotics, however on admission images were reviewed with PCCM, and we decided to not use antibiotics as patient was afebrile and without leukocytosis. He remained afebrile during his hospitalization. Pulmonology has been consulted to evaluate patient for a pleural effusion thoracentesis, however given the fact that he had a thoracentesis just a week prior, it would be a little diagnostic yield to repeat that. He was breathing well on room air, and there was no indication for a therapeutic thoracentesis. Patient has strong evidence of an unspecified autoimmune disease given positive double-stranded DNA as well other extensive workup done in his hospitalization #2 (for full labs and previous hospital courses, please refer to discharge summaries done by Dr. Isidoro Donning - 3/19 - and Dr. Lavera Guise - 3/28). He is scheduled to see a rheumatologist in about 10 days. I've curbside is rheumatology over the phone, I talked to Dr. Nickola Major, and she suggested to start empiric steroids. After starting prednisone, patient's respiratory status improved dramatically and his weakness resolved. SPEP, UPEP and CH 50 were ordered for completion. PE (pulmonary embolism) - patient was started on Lovenox and Coumadin, and he will followup with [facility] at [facility] this Wednesday at 11:45 in the morning for INR check. His INR was therapeutic on discharge and he is to go home on Coumadin 5 mg  per day  Nausea/vomiting - unclear etiology, he had an extensive workup during his previous hospitalizations, which include the right upper quarter ultrasound, CT  abdomen pelvis. Has no evidence of pancreatitis, his lipase is normal, and his LFTs were within normal limits. Patient told me his being evaluated by his PCP for pre-diabetes, I'm wondering whether this represents gastroparesis, says it always appears right after large meal. Counseled the patient on discharge to eat multiple meals a day of smaller sizes. He is to followup with PCP with any noticed an improvement in his nausea and vomiting.   Procedures:  DVT doppler LE - No evidence of deep vein thrombosis involving the right lower extremity and left lower extremity. - No evidence of Baker's cyst on the right or left.  Consultations:  Pulmonary  Discharge Exam: Filed Vitals:   06/09/12 0451 06/09/12 1400 06/10/12 0645 06/10/12 1345  BP: 139/86 112/77 150/82 141/68  Pulse: 99 99 93 94  Temp: 97.8 F (36.6 C) 97.7 F (36.5 C) 97.5 F (36.4 C) 97.8 F (36.6 C)  TempSrc: Oral Oral Oral Oral  Resp: 20 18 20 19   Height:      Weight: 82.4 kg (181 lb 10.5 oz)  83.3 kg (183 lb 10.3 oz)   SpO2: 99% 96% 97% 97%   General: NAD Cardiovascular: RRR without MRG Respiratory: CTA biL, mild decrease of breath sounds at the bases, improved during his hospitalization.   Discharge Instructions   Future Appointments Provider Department Dept Phone   07/05/2012 10:30 AM Oretha Milch, MD [facility] [phone]   06/03/2013 8:30 AM [facility] [phone]   Eat a light meal the night before the exam but please avoid gaseous foods.   Nothing to eat or drink for at least 8 hours prior to the exam. No gum chewing or smoking the morning of the exam. Please take your morning medications with small sips of water, especially blood pressure medication. If you have several vascular lab exams and will see physician, please bring a snack with you.   06/03/2013 9:00 AM Evern Bio, NP [facility] [phone]       Medication  List    TAKE these medications       BIOFLEX PO  Take 1 tablet by mouth daily.     calcium-vitamin D 500-200 MG-UNIT per tablet  Commonly known as:  OSCAL WITH D  Take 1 tablet by mouth daily.     esomeprazole 40 MG capsule  Commonly known as:  NEXIUM  Take 40 mg by mouth daily before breakfast.     fenofibrate 145 MG tablet  Commonly known as:  TRICOR  Take 145 mg by mouth daily.     ferrous sulfate 325 (65 FE) MG tablet  Take 325 mg by mouth daily with breakfast.     fish oil-omega-3 fatty acids 1000 MG capsule  Take 1 g by mouth daily.     folic acid 1 MG tablet  Commonly known as:  FOLVITE  Take 1 mg by mouth daily.     ibuprofen 200 MG tablet  Commonly known as:  ADVIL,MOTRIN  Take 400-800 mg by mouth every 8 (eight) hours as needed for pain.     multivitamin with minerals Tabs  Take 1 tablet by mouth daily.     ondansetron 4 MG tablet  Commonly known as:  ZOFRAN  Take 1 tablet (4 mg total) by mouth every 12 (twelve) hours  as needed for nausea.     predniSONE 10 MG tablet  Commonly known as:  DELTASONE  Take 4 tablets (40 mg total) by mouth daily. 4 tablets daily for 5 days, then 2 tablets daily for 5 days then 1 tablet daily until seen by Rheumatology.     rosuvastatin 5 MG tablet  Commonly known as:  CRESTOR  Take 5 mg by mouth daily.     vitamin C 500 MG tablet  Commonly known as:  ASCORBIC ACID  Take 500 mg by mouth daily.     warfarin 5 MG tablet  Commonly known as:  COUMADIN  Take 1 tablet (5 mg total) by mouth daily.           Follow-up Information   Follow up with Oretha Milch., MD On 07/05/2012. (Appt at 10 30am)    Contact information:   [address] [place] [address] [phone]       Follow up with Irving Copas, MD On 06/17/2012. (Appt at 10:30am)    Contact information:   [address], [address] [place] [address] [phone]       The results of significant diagnostics from this hospitalization (including  imaging, microbiology, ancillary and laboratory) are listed below for reference.    Significant Diagnostic Studies: Dg Chest 1 View  05/23/2012  *RADIOLOGY REPORT*  Clinical Data: Status post left thoracentesis.  CHEST - 1 VIEW  Comparison: CT chest 05/22/2012. PA and lateral chest 05/18/2012.  Findings: No pneumothorax is identified.  There are small bilateral pleural effusions and basilar airspace disease, worse on the left. Heart size is upper normal.  IMPRESSION:  1.  Negative for pneumothorax after thoracentesis. 2.  Small bilateral pleural effusions and basilar airspace disease.   Original Report Authenticated By: Holley Dexter, M.D.    Dg Chest 2 View  06/07/2012  *RADIOLOGY REPORT*  Clinical Data: Pleural effusions.  CHEST - 2 VIEW  Comparison: CT and plain films of the chest 06/05/2012.  Findings: Small to moderate bilateral pleural effusions show some increase since the prior study.  Associated basilar atelectasis is noted.  There is cardiomegaly.  IMPRESSION: Some increase in small to moderate bilateral pleural effusions with associated basilar atelectasis.   Original Report Authenticated By: Holley Dexter, M.D.    Dg Chest 2 View  06/05/2012  *RADIOLOGY REPORT*  Clinical Data: Chest pain and shortness of breath.  CHEST - 2 VIEW  Comparison: 05/23/2012  Findings: Heart size is normal.  There are small bilateral pleural effusions identified.  Atelectasis is noted within both lung bases. No focal airspace consolidation.  IMPRESSION:  Bilateral pleural effusions and bibasilar atelectasis.   Original Report Authenticated By: Signa Kell, M.D.    Dg Chest 2 View (if Patient Has Fever And/or Copd)  05/18/2012  *RADIOLOGY REPORT*  Clinical Data: Shortness of breath, chest pain  CHEST - 2 VIEW  Comparison: 05/13/2012 CT  Findings: Normal caliber aorta with tortuosity.  Heart size upper normal to mildly enlarged.  Left lung base opacity and small effusion.  No pneumothorax.  No acute osseous  finding.  IMPRESSION: Left lung base consolidation and associated effusion; atelectasis versus pneumonia, similar to recent prior.  Heart size upper normal to mildly enlarged.   Original Report Authenticated By: Jearld Lesch, M.D.    Ct Chest Wo Contrast  05/22/2012  *RADIOLOGY REPORT*  Clinical Data: Left-sided pleural effusion.  Shortness of breath.  CT CHEST WITHOUT CONTRAST  Technique:  Multidetector CT imaging of the  chest was performed following the standard protocol without IV contrast.  Comparison: Chest CT 05/13/2012 the  Findings:  Mediastinum: Heart size is mildly enlarged. There is no significant pericardial fluid, thickening or pericardial calcification. There is atherosclerosis of the thoracic aorta, the great vessels of the mediastinum and the coronary arteries, including calcified atherosclerotic plaque in the left main, left anterior descending, left circumflex and right coronary arteries. No pathologically enlarged mediastinal or hilar lymph nodes. Please note that accurate exclusion of hilar adenopathy is limited on noncontrast CT scans.  Small hiatal hernia.  Lungs/Pleura: Moderate bilateral pleural effusions layering posteriorly.  Extensive passive atelectasis is noted in the lower lobes of the lungs bilaterally.  There is also a pattern of mild diffuse interlobular septal thickening, suggesting mild interstitial pulmonary edema.  Upper Abdomen: Unremarkable.  Musculoskeletal: Osteopenia. There are no aggressive appearing lytic or blastic lesions noted in the visualized portions of the skeleton.  IMPRESSION: 1.  Moderate bilateral pleural effusions with extensive passive atelectasis in the lower lobes of the lungs bilaterally. 2.  Mild cardiomegaly with mild interstitial pulmonary edema; imaging findings suggestive of mild congestive heart failure. 3. Atherosclerosis, including left main and three-vessel coronary artery disease.   Assessment for potential risk factor modification, dietary  therapy or pharmacologic therapy may be warranted, if clinically indicated. 4.  Small hiatal hernia.   Original Report Authenticated By: Trudie Reed, M.D.    Ct Angio Chest Pe W/cm &/or Wo Cm  06/05/2012  *RADIOLOGY REPORT*  Clinical Data: Chest pain  CT ANGIOGRAPHY CHEST  Technique:  Multidetector CT imaging of the chest using the standard protocol during bolus administration of intravenous contrast. Multiplanar reconstructed images including MIPs were obtained and reviewed to evaluate the vascular anatomy.  Contrast: OMNIPAQUE IOHEXOL 350 MG/ML SOLN  Comparison: Chest x-ray earlier today 06/05/2012 at 07:43 p.m.; prior chest CT 05/13/2012  Findings:  Mediastinum: Unremarkable CT appearance of the thyroid gland.  No suspicious mediastinal or hilar adenopathy.  No soft tissue mediastinal mass.  The thoracic esophagus is unremarkable.  Heart/Vascular: Excellent opacification of the pulmonary arteries to the proximal subsegmental level.  Focal filling defect within the anterior segmental branch to the right upper lobe as well as within the right middle lobar pulmonary artery proximally. Subsegmental filling defects identified within the left lower lobe. Conventional three-vessel arch anatomy.  Scattered atherosclerotic vascular calcifications.  No aneurysmal dilatation or dissection. Atherosclerotic calcification noted throughout the coronary arteries.  There is mild cardiomegaly.  Trace pericardial fluid versus thickening is similar to prior.  No evidence of right heart strain.  Lungs/Pleura: Moderate right and small left pleural effusions with associated atelectasis. Both effusions are water attenuation, however there are some convex margins in the upper aspect of the right effusion suggesting developing loculation.  No significant pleural enhancement.  Additionally, there is some relative decreased enhancement in the posteroinferior right lower lobe containing small air bronchograms concerning for a  focus of potentially necrotic pneumonia. Improving left lower lobe consolidation.  Mild background centrilobular emphysema.  Upper Abdomen: Unremarkable limited imaging of the upper abdomen.  Bones: No acute fracture or aggressive appearing lytic or blastic osseous lesion.  IMPRESSION:  1.  Positive for relatively small burden pulmonary emboli involving a segmental right upper lobe pulmonary artery, the right middle lobe or pulmonary artery and subsegmental arteries in the left lower lobe.  2.  Focal decreased enhancement of the posteroinferior right lung parenchyma with internal air bronchograms concerning for a small focus of a potentially necrotic pneumonia.  3.  Moderate right pleural effusion with concave margin superiorly suggesting developing loculation.  No pleural enhancement to suggest empyema.  4.  Small layering left pleural effusion  5.  Additional ancillary findings as above without significant interval change compared to 05/13/2012.  Critical Value/emergent results were called by telephone at the time of interpretation on 06/05/2012 at 08:13 p.m. to Dr. Lorre Nick, who verbally acknowledged these results.   Original Report Authenticated By: Malachy Moan, M.D.    US Abdomen Complete  05/19/2012  *RADIOLOGY REPORT*  Clinical Data:  Nephrolithiasis, abdominal pain.  COMPLETE ABDOMINAL ULTRASOUND  Comparison:  CT 05/13/2012  Findings:  Gallbladder:  No shadowing gallstones or echogenic sludge.  No gallbladder wall thickening or pericholecystic fluid.  Negative sonographic Murphy's sign according to the ultrasound technologist.  Common bile duct:  6 mm diameter, unremarkable  Liver:  No focal lesion identified.  Within normal limits in parenchymal echogenicity.  IVC:  Appears normal.  Pancreas:  No focal abnormality seen.  Spleen:  7.4 cm craniocaudal length, unremarkable.  Right Kidney:  12.4 cm. No hydronephrosis.  Well-preserved cortex. Normal size and parenchymal echotexture without focal  abnormalities.  Left Kidney:  14.3 cm.  25 x 29 x 31 mm mid pole cyst.  No solid renal lesion or hydronephrosis.  Abdominal aorta:  No aneurysm identified.  IMPRESSION:  1.  Normal gallbladder. 2.  Left renal cyst.   Original Report Authenticated By: D. Andria Rhein, MD    Ct Angio Chest Aorta W/cm &/or Wo/cm  05/13/2012  *RADIOLOGY REPORT*  Clinical Data:  Unexplained chest pain and abdominal pain.  CT ANGIOGRAPHY CHEST, ABDOMEN AND PELVIS  Technique:  Multidetector CT imaging through the chest, abdomen and pelvis was performed using the standard protocol during bolus administration of intravenous contrast.  Multiplanar reconstructed images including MIPs were obtained and reviewed to evaluate the vascular anatomy.  Contrast: OMNIPAQUE IOHEXOL 350 MG/ML.  Comparison:   None.  CTA CHEST  Findings:  Unenhanced images demonstrate no evidence of mural hematoma.  Mild calcified plaque in the aortic arch and descending thoracic aorta.  Extensive three-vessel coronary atherosclerotic calcification.  Enhanced images demonstrate no evidence of thoracic aortic aneurysm or dissection.  Central pulmonary arteries widely patent.  Heart moderately enlarged with left ventricular predominance.  No pericardial effusion.  Proximal great vessels mildly atherosclerotic though widely patent.  Airspace consolidation with air bronchograms in the left lower lobe and minimally in the lingula.  Associated small left pleural effusion.  Emphysematous changes throughout both lungs.  Dependent atelectasis posteriorly in the right lower lobe.  Lungs otherwise clear.  No evidence of interstitial lung disease.  No right pleural effusion.  No significant mediastinal, hilar, or axillary lymphadenopathy. Visualized thyroid gland with ACE sub-centimeter nodule in the lower pole of the right lobe.  Bone window images demonstrate lower thoracic spondylosis and a hemangioma in the T10 vertebral body.   Review of the MIP images confirms the above  findings.  IMPRESSION:  1.  No evidence of thoracic aortic aneurysm or dissection. 2.  Pneumonia involving the left lower lobe and lingula. Associated small left parapneumonic effusion. 3.  Cardiomegaly with left ventricular predominance.  Extensive three-vessel coronary atherosclerosis. 4.  COPD/emphysema.  CTA ABDOMEN AND PELVIS  Findings:  No evidence of abdominal aortic aneurysm or dissection. Mild atherosclerosis involving the distal abdominal aorta. Atherosclerosis at the origin of the celiac artery, SMA, and the left renal artery, without significant stenoses.  IMA patent. Iliofemoral arterial systems widely patent bilaterally  with mild atherosclerosis.  Very small simple cysts in the medial segment left lobe anterior segment right lobe of liver; no significant focal hepatic parenchymal abnormalities.  Hyperdense round lesion in the posterior mid spleen likely a small hemangioma; no significant abnormalities involving the spleen, allowing for the early arterial phase enhancement.  Normal-appearing pancreas, right adrenal gland, and gallbladder.  Approximate 1.2 cm diameter nodule involving the left adrenal gland.  No biliary ductal dilation.  Numerous very small bilateral renal calculi without evidence of obstruction, the largest stone approximating 4 mm in a lower pole calix of the right kidney.  Approximate 3.5 cm simple cyst arising from the mid left kidney.  No significant focal parenchymal abnormality involving either kidney.  Very small hiatal hernia.  Stomach decompressed otherwise unremarkable.  Normal-appearing small bowel.  Scattered sigmoid colon diverticula without evidence of acute diverticulitis; remainder of the colon unremarkable.  Appendix surgically absent. No ascites.  Mild median lobe prostate gland enlargement consistent with age. Normal seminal vesicles.  Urinary bladder decompressed and unremarkable.  Numerous pelvic phleboliths.  Bone window images demonstrate lower thoracic  spondylosis, degenerative disc disease at L5-S1, facet degenerative changes involving the lumbar spine, degenerative changes in the sacroiliac joints, and degenerative changes in the hips.   Review of the MIP images confirms the above findings.  IMPRESSION:  1.  No evidence of abdominal aortic aneurysm or dissection. 2.  Numerous small nonobstructing bilateral renal calculi. 3.  1.2 cm diameter left adrenal nodule, statistically consistent with a small adenoma. 4.  Very small hiatal hernia. 5.  Mild median lobe prostate gland enlargement. 6.  Sigmoid colon diverticulosis without evidence of acute diverticulitis.   Original Report Authenticated By: Hulan Saas, M.D.    Ct Angio Abd/pel W/ And/or W/o  05/13/2012  *RADIOLOGY REPORT*  Clinical Data:  Unexplained chest pain and abdominal pain.  CT ANGIOGRAPHY CHEST, ABDOMEN AND PELVIS  Technique:  Multidetector CT imaging through the chest, abdomen and pelvis was performed using the standard protocol during bolus administration of intravenous contrast.  Multiplanar reconstructed images including MIPs were obtained and reviewed to evaluate the vascular anatomy.  Contrast: OMNIPAQUE IOHEXOL 350 MG/ML.  Comparison:   None.  CTA CHEST  Findings:  Unenhanced images demonstrate no evidence of mural hematoma.  Mild calcified plaque in the aortic arch and descending thoracic aorta.  Extensive three-vessel coronary atherosclerotic calcification.  Enhanced images demonstrate no evidence of thoracic aortic aneurysm or dissection.  Central pulmonary arteries widely patent.  Heart moderately enlarged with left ventricular predominance.  No pericardial effusion.  Proximal great vessels mildly atherosclerotic though widely patent.  Airspace consolidation with air bronchograms in the left lower lobe and minimally in the lingula.  Associated small left pleural effusion.  Emphysematous changes throughout both lungs.  Dependent atelectasis posteriorly in the right lower lobe.   Lungs otherwise clear.  No evidence of interstitial lung disease.  No right pleural effusion.  No significant mediastinal, hilar, or axillary lymphadenopathy. Visualized thyroid gland with ACE sub-centimeter nodule in the lower pole of the right lobe.  Bone window images demonstrate lower thoracic spondylosis and a hemangioma in the T10 vertebral body.   Review of the MIP images confirms the above findings.  IMPRESSION:  1.  No evidence of thoracic aortic aneurysm or dissection. 2.  Pneumonia involving the left lower lobe and lingula. Associated small left parapneumonic effusion. 3.  Cardiomegaly with left ventricular predominance.  Extensive three-vessel coronary atherosclerosis. 4.  COPD/emphysema.  CTA ABDOMEN AND PELVIS  Findings:  No evidence of abdominal aortic aneurysm or dissection. Mild atherosclerosis involving the distal abdominal aorta. Atherosclerosis at the origin of the celiac artery, SMA, and the left renal artery, without significant stenoses.  IMA patent. Iliofemoral arterial systems widely patent bilaterally with mild atherosclerosis.  Very small simple cysts in the medial segment left lobe anterior segment right lobe of liver; no significant focal hepatic parenchymal abnormalities.  Hyperdense round lesion in the posterior mid spleen likely a small hemangioma; no significant abnormalities involving the spleen, allowing for the early arterial phase enhancement.  Normal-appearing pancreas, right adrenal gland, and gallbladder.  Approximate 1.2 cm diameter nodule involving the left adrenal gland.  No biliary ductal dilation.  Numerous very small bilateral renal calculi without evidence of obstruction, the largest stone approximating 4 mm in a lower pole calix of the right kidney.  Approximate 3.5 cm simple cyst arising from the mid left kidney.  No significant focal parenchymal abnormality involving either kidney.  Very small hiatal hernia.  Stomach decompressed otherwise unremarkable.   Normal-appearing small bowel.  Scattered sigmoid colon diverticula without evidence of acute diverticulitis; remainder of the colon unremarkable.  Appendix surgically absent. No ascites.  Mild median lobe prostate gland enlargement consistent with age. Normal seminal vesicles.  Urinary bladder decompressed and unremarkable.  Numerous pelvic phleboliths.  Bone window images demonstrate lower thoracic spondylosis, degenerative disc disease at L5-S1, facet degenerative changes involving the lumbar spine, degenerative changes in the sacroiliac joints, and degenerative changes in the hips.   Review of the MIP images confirms the above findings.  IMPRESSION:  1.  No evidence of abdominal aortic aneurysm or dissection. 2.  Numerous small nonobstructing bilateral renal calculi. 3.  1.2 cm diameter left adrenal nodule, statistically consistent with a small adenoma. 4.  Very small hiatal hernia. 5.  Mild median lobe prostate gland enlargement. 6.  Sigmoid colon diverticulosis without evidence of acute diverticulitis.   Original Report Authenticated By: Hulan Saas, M.D.    US Thoracentesis Asp Pleural Space W/img Guide  05/23/2012  *RADIOLOGY REPORT*  Clinical Data:  Pneumonia, bilateral pleural effusions request for diagnostic and therapeutic thoracentesis  ULTRASOUND GUIDED right THORACENTESIS  Comparison:  None  An ultrasound guided thoracentesis was thoroughly discussed with the patient and questions answered.  The benefits, risks, alternatives and complications were also discussed.  The patient understands and wishes to proceed with the procedure.  Written consent was obtained.  Imaging finds bilateral small pleural effusions. The right sided effusion was slightly lasrger than the left. Additionally, some consolidated left lung was adjacent to the pleura, preventing a safe window for thoracentesis. Therefore, the right side was chosen for thoracentesis.  Ultrasound was performed to localize and mark an adequate  pocket of fluid in the right chest.  The area was then prepped and draped in the normal sterile fashion.  1% Lidocaine was used for local anesthesia.  Under ultrasound guidance a 19 gauge Yueh catheter was introduced.  Thoracentesis was performed.  The catheter was removed and a dressing applied.  Complications:  None immediate  Findings: A total of approximately 230 ml of hazy yellow fluid was removed. A fluid sample was sent for laboratory analysis.  IMPRESSION: Successful ultrasound guided right thoracentesis yielding 230 ml of pleural fluid.  Read by Brayton El PA-C   Original Report Authenticated By: Richarda Overlie, M.D.    Labs: Basic Metabolic Panel:  Recent Labs Lab 06/05/12 1700 06/06/12 0500  NA 139 140  K 4.0 3.6  CL 104 104  CO2  25 24  GLUCOSE 93 92  BUN 13 14  CREATININE 0.61 0.65  CALCIUM 8.8 8.6   Liver Function Tests:  Recent Labs Lab 06/05/12 1700  AST 45*  ALT 33  ALKPHOS 108  BILITOT 0.4  PROT 6.2  ALBUMIN 2.4*    Recent Labs Lab 06/05/12 1700  LIPASE 28   CBC:  Recent Labs Lab 06/05/12 1700 06/06/12 0500  WBC 6.5 6.5  NEUTROABS 5.1  --   HGB 12.2* 12.0*  HCT 36.3* 35.9*  MCV 82.1 83.1  PLT 313 267   BNP: BNP (last 3 results)  Recent Labs  06/05/12 1700  PROBNP 209.0    Signed:  Marshall Roehrich  [facility] 06/11/2012, 1:41 PM

## 2012-06-12 DIAGNOSIS — Z7901 Long term (current) use of anticoagulants: Secondary | ICD-10-CM | POA: Diagnosis not present

## 2012-06-12 DIAGNOSIS — I2699 Other pulmonary embolism without acute cor pulmonale: Secondary | ICD-10-CM | POA: Diagnosis not present

## 2012-06-18 DIAGNOSIS — R300 Dysuria: Secondary | ICD-10-CM | POA: Diagnosis not present

## 2012-06-18 DIAGNOSIS — M329 Systemic lupus erythematosus, unspecified: Secondary | ICD-10-CM | POA: Diagnosis not present

## 2012-06-18 DIAGNOSIS — R5381 Other malaise: Secondary | ICD-10-CM | POA: Diagnosis not present

## 2012-06-18 DIAGNOSIS — M25549 Pain in joints of unspecified hand: Secondary | ICD-10-CM | POA: Diagnosis not present

## 2012-06-18 DIAGNOSIS — M2550 Pain in unspecified joint: Secondary | ICD-10-CM | POA: Diagnosis not present

## 2012-06-18 DIAGNOSIS — M25579 Pain in unspecified ankle and joints of unspecified foot: Secondary | ICD-10-CM | POA: Diagnosis not present

## 2012-06-18 DIAGNOSIS — R5383 Other fatigue: Secondary | ICD-10-CM | POA: Diagnosis not present

## 2012-06-18 DIAGNOSIS — Z111 Encounter for screening for respiratory tuberculosis: Secondary | ICD-10-CM | POA: Diagnosis not present

## 2012-06-18 DIAGNOSIS — Z79899 Other long term (current) drug therapy: Secondary | ICD-10-CM | POA: Diagnosis not present

## 2012-06-21 DIAGNOSIS — M329 Systemic lupus erythematosus, unspecified: Secondary | ICD-10-CM | POA: Diagnosis not present

## 2012-06-21 DIAGNOSIS — M25579 Pain in unspecified ankle and joints of unspecified foot: Secondary | ICD-10-CM | POA: Diagnosis not present

## 2012-06-21 DIAGNOSIS — R5383 Other fatigue: Secondary | ICD-10-CM | POA: Diagnosis not present

## 2012-06-21 DIAGNOSIS — M25549 Pain in joints of unspecified hand: Secondary | ICD-10-CM | POA: Diagnosis not present

## 2012-06-24 DIAGNOSIS — M329 Systemic lupus erythematosus, unspecified: Secondary | ICD-10-CM | POA: Diagnosis not present

## 2012-06-24 DIAGNOSIS — D649 Anemia, unspecified: Secondary | ICD-10-CM | POA: Diagnosis not present

## 2012-06-24 DIAGNOSIS — M25549 Pain in joints of unspecified hand: Secondary | ICD-10-CM | POA: Diagnosis not present

## 2012-06-24 DIAGNOSIS — R0602 Shortness of breath: Secondary | ICD-10-CM | POA: Diagnosis not present

## 2012-06-26 DIAGNOSIS — Z7901 Long term (current) use of anticoagulants: Secondary | ICD-10-CM | POA: Diagnosis not present

## 2012-07-04 ENCOUNTER — Ambulatory Visit
Admission: RE | Admit: 2012-07-04 | Discharge: 2012-07-04 | Disposition: A | Discharge status: Home / Self Care | Payer: No Typology Code available for payment source | Source: Ambulatory Visit | Attending: Infectious Diseases | Admitting: Infectious Diseases

## 2012-07-04 ENCOUNTER — Other Ambulatory Visit: Payer: Self-pay | Admitting: Infectious Diseases

## 2012-07-04 DIAGNOSIS — R7611 Nonspecific reaction to tuberculin skin test without active tuberculosis: Secondary | ICD-10-CM

## 2012-07-05 ENCOUNTER — Ambulatory Visit (INDEPENDENT_AMBULATORY_CARE_PROVIDER_SITE_OTHER): Payer: Medicare Other | Admitting: Pulmonary Disease

## 2012-07-05 ENCOUNTER — Encounter: Payer: Self-pay | Admitting: Pulmonary Disease

## 2012-07-05 VITALS — BP 124/76 | HR 86 | Temp 97.8°F | Ht 68.5 in | Wt 191.6 lb

## 2012-07-05 DIAGNOSIS — J90 Pleural effusion, not elsewhere classified: Secondary | ICD-10-CM | POA: Diagnosis not present

## 2012-07-05 DIAGNOSIS — R7612 Nonspecific reaction to cell mediated immunity measurement of gamma interferon antigen response without active tuberculosis: Secondary | ICD-10-CM

## 2012-07-05 DIAGNOSIS — I2699 Other pulmonary embolism without acute cor pulmonale: Secondary | ICD-10-CM

## 2012-07-05 MED ORDER — ISONIAZID 300 MG PO TABS: 300.0000 mg | ORAL_TABLET | Freq: Every day | ORAL | Status: DC

## 2012-07-05 NOTE — Patient Instructions (Addendum)
CXR has cleared You will need to be on coumadin (blood thinner ) for 6 months until oct For positive Tb blood test, you will have to take an antibiotic (INH) x 6 mnths  You will need monthly liver tests while o this medication(can be done with dr Abigail Miyamoto)

## 2012-07-05 NOTE — Progress Notes (Signed)
Subjective:    Patient ID: Tyler Norris, male    DOB: May 30, 1934, 78 y.o.   MRN: [id number]  HPI  78 year old never smoker for FU of  bilateral pleural effusions &  30 pound weight loss.  He was hospitalized and treated for community-acquired pneumonia from 05/13/12 to 3/ 19/14. CT chest on 05/13/12 showed consolidation in left lower lobe and lingula with a small left parapneumonic effusion but no evidence of pulmonary embolism. CT abdomen showed small nonobstructive bilateral renal calculi and sigmoid colon diverticulosis and a left adrenal nodule consistent with adenoma. ANA was noted to be strongly positive at 1 in 1280 , RA factor was mildly positive at 18. Double-stranded DNA was positive, and levels were normal as was CCP antibody. ESR was 60 and anti-Smith antibody was negative  He was readmitted on 05/18/12 4 pleuritic chest pain and discharged again on 05/26/12. He underwent thoracentesis on 05/23/12 of 230 mL of monocytic exudative fluid. Protein was 3.3, LDH was 705, NT 25 white cells were noted mostly monocytes.  He was readmitted on 4/9- 06/10/12 for chest discomfort and shortness of breath - really unchanged from his last discharge .CT angiogram on 06/05/12 showed small subsegmental pulmonary emboli in the right upper lobe, right middle lobe and left lower lobe. A right effusion was noted somewhat loculated and a small left effusion and was layering.  Echo showed normal systolic function with an EF of 55% and no evidence of diastolic dysfunction.   - Seen by rheumatology - started on prednisone 40 mg  -venous duplex neg, transitioned to Coumadin with Lovenox bridge   -Reason for his persistent vomiting and weight loss was unclear  -right upper quarter ultrasound, CT abdomen pelvis. Has no evidence of pancreatitis, his lipase is normal, and his LFTs were within normal limits. ?gastroparesis, says it always appears right after large meal    07/05/2012 Quantiferon test was positive pt had  CXR 07/04/12 - clearing of BL effusions. Pt states he is feeling better. Pt sates he is not coughing, no wheezing, no chest tx, denies any SOB. Pt has no complaints today He is tolerating coumadin well Appetite is huge on prednisone 35 mg with 5 mg taper planned every week (by rheum)  Rpt testing showed neg ANa, pos ds- DNa, neg APLA & ACLA, Ace level was 20   Past Medical History  Diagnosis Date  . Kidney stones   . Hyperlipidemia   . GERD (gastroesophageal reflux disease)   . Carpal tunnel syndrome, bilateral   . Sleep apnea     uses cpap every night  . AAA (abdominal aortic aneurysm)     resolved by ct scan 09-2011  . Arthritis   . Anemia   . Barrett's esophagus   . Hypertension   . Pneumonia     Review of Systems neg for any significant sore throat, dysphagia, itching, sneezing, nasal congestion or excess/ purulent secretions, fever, chills, sweats, unintended wt loss, pleuritic or exertional cp, hempoptysis, orthopnea pnd or change in chronic leg swelling. Also denies presyncope, palpitations, heartburn, abdominal pain, nausea, vomiting, diarrhea or change in bowel or urinary habits, dysuria,hematuria, rash, arthralgias, visual complaints, headache, numbness weakness or ataxia.     Objective:   Physical Exam  Gen. Pleasant, well-nourished, in no distress ENT - no lesions, no post nasal drip Neck: No JVD, no thyromegaly, no carotid bruits Lungs: no use of accessory muscles, no dullness to percussion, clear without rales or rhonchi  Cardiovascular: Rhythm regular, heart sounds  normal, no murmurs or gallops, no peripheral edema Musculoskeletal: No deformities, no cyanosis or clubbing        Assessment & Plan:

## 2012-07-05 NOTE — Assessment & Plan Note (Signed)
CXR has cleared Prednisone taper per rheum

## 2012-07-05 NOTE — Assessment & Plan Note (Signed)
You will need to be on coumadin (blood thinner ) for 6 months until oct

## 2012-07-05 NOTE — Assessment & Plan Note (Signed)
For positive Tb blood test, you will have to take an antibiotic (INH) x 6 mnths  You will need monthly liver tests while o this medication(can be done with dr Abigail Miyamoto)

## 2012-07-10 DIAGNOSIS — I2699 Other pulmonary embolism without acute cor pulmonale: Secondary | ICD-10-CM | POA: Diagnosis not present

## 2012-07-10 DIAGNOSIS — Z7901 Long term (current) use of anticoagulants: Secondary | ICD-10-CM | POA: Diagnosis not present

## 2012-07-23 DIAGNOSIS — M329 Systemic lupus erythematosus, unspecified: Secondary | ICD-10-CM | POA: Diagnosis not present

## 2012-07-23 DIAGNOSIS — T451X5A Adverse effect of antineoplastic and immunosuppressive drugs, initial encounter: Secondary | ICD-10-CM | POA: Diagnosis not present

## 2012-07-23 DIAGNOSIS — M25549 Pain in joints of unspecified hand: Secondary | ICD-10-CM | POA: Diagnosis not present

## 2012-07-23 DIAGNOSIS — Z09 Encounter for follow-up examination after completed treatment for conditions other than malignant neoplasm: Secondary | ICD-10-CM | POA: Diagnosis not present

## 2012-07-24 DIAGNOSIS — R7989 Other specified abnormal findings of blood chemistry: Secondary | ICD-10-CM | POA: Diagnosis not present

## 2012-07-24 DIAGNOSIS — M2550 Pain in unspecified joint: Secondary | ICD-10-CM | POA: Diagnosis not present

## 2012-07-24 DIAGNOSIS — Z7901 Long term (current) use of anticoagulants: Secondary | ICD-10-CM | POA: Diagnosis not present

## 2012-07-24 DIAGNOSIS — Z79899 Other long term (current) drug therapy: Secondary | ICD-10-CM | POA: Diagnosis not present

## 2012-07-24 DIAGNOSIS — M329 Systemic lupus erythematosus, unspecified: Secondary | ICD-10-CM | POA: Diagnosis not present

## 2012-07-25 DIAGNOSIS — Z79899 Other long term (current) drug therapy: Secondary | ICD-10-CM | POA: Diagnosis not present

## 2012-07-26 DIAGNOSIS — IMO0002 Reserved for concepts with insufficient information to code with codable children: Secondary | ICD-10-CM | POA: Diagnosis not present

## 2012-07-31 DIAGNOSIS — Z7901 Long term (current) use of anticoagulants: Secondary | ICD-10-CM | POA: Diagnosis not present

## 2012-07-31 DIAGNOSIS — E119 Type 2 diabetes mellitus without complications: Secondary | ICD-10-CM | POA: Diagnosis not present

## 2012-08-14 DIAGNOSIS — Z7901 Long term (current) use of anticoagulants: Secondary | ICD-10-CM | POA: Diagnosis not present

## 2012-08-21 DIAGNOSIS — I2699 Other pulmonary embolism without acute cor pulmonale: Secondary | ICD-10-CM | POA: Diagnosis not present

## 2012-08-21 DIAGNOSIS — E119 Type 2 diabetes mellitus without complications: Secondary | ICD-10-CM | POA: Diagnosis not present

## 2012-08-21 DIAGNOSIS — M329 Systemic lupus erythematosus, unspecified: Secondary | ICD-10-CM | POA: Diagnosis not present

## 2012-08-21 DIAGNOSIS — Z79899 Other long term (current) drug therapy: Secondary | ICD-10-CM | POA: Diagnosis not present

## 2012-09-11 DIAGNOSIS — Z7901 Long term (current) use of anticoagulants: Secondary | ICD-10-CM | POA: Diagnosis not present

## 2012-10-06 ENCOUNTER — Other Ambulatory Visit: Payer: Self-pay | Admitting: Internal Medicine

## 2012-10-07 NOTE — Telephone Encounter (Signed)
Refill on coumadin 

## 2012-10-11 DIAGNOSIS — R7301 Impaired fasting glucose: Secondary | ICD-10-CM | POA: Diagnosis not present

## 2012-10-16 DIAGNOSIS — Z7901 Long term (current) use of anticoagulants: Secondary | ICD-10-CM | POA: Diagnosis not present

## 2012-10-25 DIAGNOSIS — R5381 Other malaise: Secondary | ICD-10-CM | POA: Diagnosis not present

## 2012-10-25 DIAGNOSIS — J84115 Respiratory bronchiolitis interstitial lung disease: Secondary | ICD-10-CM | POA: Diagnosis not present

## 2012-10-25 DIAGNOSIS — M329 Systemic lupus erythematosus, unspecified: Secondary | ICD-10-CM | POA: Diagnosis not present

## 2012-10-25 DIAGNOSIS — D649 Anemia, unspecified: Secondary | ICD-10-CM | POA: Diagnosis not present

## 2012-11-11 ENCOUNTER — Encounter: Payer: Self-pay | Admitting: Pulmonary Disease

## 2012-11-11 ENCOUNTER — Ambulatory Visit (INDEPENDENT_AMBULATORY_CARE_PROVIDER_SITE_OTHER): Payer: Medicare Other | Admitting: Pulmonary Disease

## 2012-11-11 VITALS — BP 138/78 | HR 75 | Ht 68.5 in | Wt 223.0 lb

## 2012-11-11 DIAGNOSIS — Z23 Encounter for immunization: Secondary | ICD-10-CM

## 2012-11-11 DIAGNOSIS — I2699 Other pulmonary embolism without acute cor pulmonale: Secondary | ICD-10-CM | POA: Diagnosis not present

## 2012-11-11 DIAGNOSIS — J90 Pleural effusion, not elsewhere classified: Secondary | ICD-10-CM

## 2012-11-11 DIAGNOSIS — R7612 Nonspecific reaction to cell mediated immunity measurement of gamma interferon antigen response without active tuberculosis: Secondary | ICD-10-CM | POA: Diagnosis not present

## 2012-11-11 NOTE — Assessment & Plan Note (Signed)
INH until nov 30  Then stop

## 2012-11-11 NOTE — Progress Notes (Signed)
  Subjective:    Patient ID: Tyler Norris, male    DOB: [birth year], 78 y.o.   MRN: [id number]  HPI   78 year old never smoker for FU of bilateral pleural effusions & 30 pound weight loss.  He was hospitalized and treated for community-acquired pneumonia from 05/13/12 to 3/ 19/14. CT chest on 05/13/12 showed consolidation in left lower lobe and lingula with a small left parapneumonic effusion but no evidence of pulmonary embolism. CT abdomen showed small nonobstructive bilateral renal calculi and sigmoid colon diverticulosis and a left adrenal nodule consistent with adenoma. ANA was noted to be strongly positive at 1 in 1280 , RA factor was mildly positive at 18. Double-stranded DNA was positive, and levels were normal as was CCP antibody. ESR was 60 and anti-Smith antibody was negative  He was readmitted on 05/18/12 4 pleuritic chest pain and discharged again on 05/26/12. He underwent thoracentesis on 05/23/12 of 230 mL of monocytic exudative fluid.   He was readmitted on 4/9- 06/10/12 for chest discomfort and shortness of breath - really unchanged from his last discharge .CT angiogram on 06/05/12 showed small subsegmental pulmonary emboli in the right upper lobe, right middle lobe and left lower lobe. A right effusion was noted somewhat loculated and a small left effusion and was layering.  Echo showed normal systolic function with an EF of 55% and no evidence of diastolic dysfunction.  -venous duplex neg, transitioned to Coumadin with Lovenox bridge  - Seen by rheumatology - started on prednisone 40 mg  -Reason for his persistent vomiting and weight loss was unclear -right upper quarter ultrasound, CT abdomen pelvis. Has no evidence of pancreatitis, his lipase is normal, and his LFTs were within normal limits. ?gastroparesis, says it always appears right after large meal   07/05/2012  Quantiferon test was positive >> INH started CXR 07/04/12 - clearing of BL effusions. prednisone 35 mg with 5 mg taper  planned every week (by rheum)  Rpt testing showed neg ANa, pos ds- DNa, neg APLA & ACLA, Ace level was 20    11/11/2012 29m FU Pt reports he feels like his 'esophagus' has doubled in diameter. When he coughs he does not have as much resistance. Pt does not cough very frequently, clears throat. denies any wheezing, no chest tx, no fever, no chills, no sweats.  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 Also denies presyncope, palpitations, heartburn, abdominal pain, nausea, vomiting, diarrhea or change in bowel or urinary habits, dysuria,hematuria, rash, arthralgias, visual complaints, headache, numbness weakness or ataxia.     Objective:   Physical Exam  Gen. Pleasant, well-nourished, in no distress ENT - no lesions, no post nasal drip Neck: No JVD, no thyromegaly, no carotid bruits Lungs: no use of accessory muscles, no dullness to percussion, clear without rales or rhonchi  Cardiovascular: Rhythm regular, heart sounds  normal, no murmurs or gallops, no peripheral edema Musculoskeletal: No deformities, no cyanosis or clubbing        Assessment & Plan:

## 2012-11-11 NOTE — Assessment & Plan Note (Signed)
Can stop coumadin after 6 mnths in oct 2014

## 2012-11-11 NOTE — Patient Instructions (Signed)
Flu shot STOP Coumadin oct 15th STOP INH on Nov 30th when you stop prednisone

## 2012-11-11 NOTE — Assessment & Plan Note (Signed)
-  resolved pred being tapered to off by rheum RPt CXR next visit in 4 m

## 2012-11-12 DIAGNOSIS — Z7901 Long term (current) use of anticoagulants: Secondary | ICD-10-CM | POA: Diagnosis not present

## 2012-11-12 DIAGNOSIS — Z79899 Other long term (current) drug therapy: Secondary | ICD-10-CM | POA: Diagnosis not present

## 2012-12-11 DIAGNOSIS — Z79899 Other long term (current) drug therapy: Secondary | ICD-10-CM | POA: Diagnosis not present

## 2012-12-11 DIAGNOSIS — Z7901 Long term (current) use of anticoagulants: Secondary | ICD-10-CM | POA: Diagnosis not present

## 2012-12-19 ENCOUNTER — Other Ambulatory Visit: Payer: Self-pay | Admitting: Pulmonary Disease

## 2012-12-19 NOTE — Telephone Encounter (Signed)
Refill x 1  -stop when done

## 2012-12-19 NOTE — Telephone Encounter (Signed)
Please advise Dr. Vassie Loll if okay to refill isoniazid? Thanks Last OV 11/11/12: Patient Instructions    Flu shot STOP Coumadin oct 15th STOP INH on Nov 30th when you stop prednisone

## 2012-12-20 DIAGNOSIS — R109 Unspecified abdominal pain: Secondary | ICD-10-CM | POA: Diagnosis not present

## 2012-12-20 NOTE — Telephone Encounter (Signed)
rx has been sent 

## 2012-12-25 ENCOUNTER — Other Ambulatory Visit (HOSPITAL_COMMUNITY): Payer: Self-pay | Admitting: Family Medicine

## 2012-12-25 DIAGNOSIS — J441 Chronic obstructive pulmonary disease with (acute) exacerbation: Secondary | ICD-10-CM

## 2012-12-26 DIAGNOSIS — I2510 Atherosclerotic heart disease of native coronary artery without angina pectoris: Secondary | ICD-10-CM | POA: Diagnosis not present

## 2012-12-27 DIAGNOSIS — R030 Elevated blood-pressure reading, without diagnosis of hypertension: Secondary | ICD-10-CM | POA: Diagnosis not present

## 2012-12-27 DIAGNOSIS — M329 Systemic lupus erythematosus, unspecified: Secondary | ICD-10-CM | POA: Diagnosis not present

## 2012-12-27 DIAGNOSIS — R7612 Nonspecific reaction to cell mediated immunity measurement of gamma interferon antigen response without active tuberculosis: Secondary | ICD-10-CM | POA: Diagnosis not present

## 2013-01-01 ENCOUNTER — Encounter (HOSPITAL_COMMUNITY): Payer: Medicare Other

## 2013-01-01 ENCOUNTER — Ambulatory Visit (HOSPITAL_COMMUNITY)
Admission: RE | Admit: 2013-01-01 | Discharge: 2013-01-01 | Disposition: A | Discharge status: Home / Self Care | Payer: Medicare Other | Source: Ambulatory Visit | Attending: Family Medicine | Admitting: Family Medicine

## 2013-01-01 DIAGNOSIS — J449 Chronic obstructive pulmonary disease, unspecified: Secondary | ICD-10-CM | POA: Insufficient documentation

## 2013-01-01 DIAGNOSIS — J4489 Other specified chronic obstructive pulmonary disease: Secondary | ICD-10-CM | POA: Insufficient documentation

## 2013-01-01 MED ORDER — ALBUTEROL SULFATE (5 MG/ML) 0.5% IN NEBU
2.5000 mg | INHALATION_SOLUTION | Freq: Once | RESPIRATORY_TRACT | Status: AC
  Administered 2013-01-01: 2.5 mg via RESPIRATORY_TRACT

## 2013-01-06 DIAGNOSIS — M329 Systemic lupus erythematosus, unspecified: Secondary | ICD-10-CM | POA: Diagnosis not present

## 2013-01-06 DIAGNOSIS — R6889 Other general symptoms and signs: Secondary | ICD-10-CM | POA: Diagnosis not present

## 2013-01-06 DIAGNOSIS — M19049 Primary osteoarthritis, unspecified hand: Secondary | ICD-10-CM | POA: Diagnosis not present

## 2013-01-06 DIAGNOSIS — Z09 Encounter for follow-up examination after completed treatment for conditions other than malignant neoplasm: Secondary | ICD-10-CM | POA: Diagnosis not present

## 2013-01-08 DIAGNOSIS — D239 Other benign neoplasm of skin, unspecified: Secondary | ICD-10-CM | POA: Diagnosis not present

## 2013-01-08 DIAGNOSIS — IMO0001 Reserved for inherently not codable concepts without codable children: Secondary | ICD-10-CM | POA: Diagnosis not present

## 2013-01-08 DIAGNOSIS — R300 Dysuria: Secondary | ICD-10-CM | POA: Diagnosis not present

## 2013-01-08 DIAGNOSIS — L723 Sebaceous cyst: Secondary | ICD-10-CM | POA: Diagnosis not present

## 2013-01-08 DIAGNOSIS — I2699 Other pulmonary embolism without acute cor pulmonale: Secondary | ICD-10-CM | POA: Diagnosis not present

## 2013-01-08 DIAGNOSIS — Z79899 Other long term (current) drug therapy: Secondary | ICD-10-CM | POA: Diagnosis not present

## 2013-01-13 DIAGNOSIS — E7800 Pure hypercholesterolemia, unspecified: Secondary | ICD-10-CM | POA: Diagnosis not present

## 2013-01-13 DIAGNOSIS — Z79899 Other long term (current) drug therapy: Secondary | ICD-10-CM | POA: Diagnosis not present

## 2013-01-28 ENCOUNTER — Other Ambulatory Visit: Payer: Self-pay | Admitting: Internal Medicine

## 2013-01-29 DIAGNOSIS — R3129 Other microscopic hematuria: Secondary | ICD-10-CM | POA: Diagnosis not present

## 2013-01-29 DIAGNOSIS — R6882 Decreased libido: Secondary | ICD-10-CM | POA: Diagnosis not present

## 2013-01-30 ENCOUNTER — Encounter: Payer: Medicare Other | Admitting: Physician Assistant

## 2013-01-30 DIAGNOSIS — R3129 Other microscopic hematuria: Secondary | ICD-10-CM | POA: Diagnosis not present

## 2013-01-30 DIAGNOSIS — N200 Calculus of kidney: Secondary | ICD-10-CM | POA: Diagnosis not present

## 2013-01-30 DIAGNOSIS — K8020 Calculus of gallbladder without cholecystitis without obstruction: Secondary | ICD-10-CM | POA: Diagnosis not present

## 2013-02-03 ENCOUNTER — Other Ambulatory Visit: Payer: Self-pay | Admitting: Pulmonary Disease

## 2013-02-03 ENCOUNTER — Other Ambulatory Visit: Payer: Self-pay | Admitting: Internal Medicine

## 2013-02-11 DIAGNOSIS — N200 Calculus of kidney: Secondary | ICD-10-CM | POA: Diagnosis not present

## 2013-02-11 DIAGNOSIS — R3129 Other microscopic hematuria: Secondary | ICD-10-CM | POA: Diagnosis not present

## 2013-02-11 DIAGNOSIS — N281 Cyst of kidney, acquired: Secondary | ICD-10-CM | POA: Diagnosis not present

## 2013-02-12 DIAGNOSIS — Z7901 Long term (current) use of anticoagulants: Secondary | ICD-10-CM | POA: Diagnosis not present

## 2013-02-12 DIAGNOSIS — R7612 Nonspecific reaction to cell mediated immunity measurement of gamma interferon antigen response without active tuberculosis: Secondary | ICD-10-CM | POA: Diagnosis not present

## 2013-03-05 DIAGNOSIS — L821 Other seborrheic keratosis: Secondary | ICD-10-CM | POA: Diagnosis not present

## 2013-03-05 DIAGNOSIS — L570 Actinic keratosis: Secondary | ICD-10-CM | POA: Diagnosis not present

## 2013-03-05 DIAGNOSIS — D239 Other benign neoplasm of skin, unspecified: Secondary | ICD-10-CM | POA: Diagnosis not present

## 2013-03-10 ENCOUNTER — Ambulatory Visit (INDEPENDENT_AMBULATORY_CARE_PROVIDER_SITE_OTHER): Payer: Medicare Other | Admitting: Adult Health

## 2013-03-10 ENCOUNTER — Encounter: Payer: Self-pay | Admitting: Adult Health

## 2013-03-10 VITALS — BP 130/84 | HR 76 | Temp 97.4°F | Ht 69.0 in | Wt 230.8 lb

## 2013-03-10 DIAGNOSIS — J90 Pleural effusion, not elsewhere classified: Secondary | ICD-10-CM

## 2013-03-10 DIAGNOSIS — I2699 Other pulmonary embolism without acute cor pulmonale: Secondary | ICD-10-CM | POA: Diagnosis not present

## 2013-03-10 DIAGNOSIS — G4733 Obstructive sleep apnea (adult) (pediatric): Secondary | ICD-10-CM

## 2013-03-10 DIAGNOSIS — Z9989 Dependence on other enabling machines and devices: Secondary | ICD-10-CM

## 2013-03-10 NOTE — Patient Instructions (Addendum)
May stop Coumadin today .  Continue on current regimen .  Follow up Dr. Elsworth Soho  In 4-6 months and As needed

## 2013-03-10 NOTE — Progress Notes (Signed)
Subjective:    Patient ID: Tyler Norris, male    DOB: 1934-04-09, 79 y.o.   MRN: [id number]  HPI    79 year old never smoker for FU of bilateral pleural effusions & 30 pound weight loss.  He was hospitalized and treated for community-acquired pneumonia from 05/13/12 to 3/ 19/14. CT chest on 05/13/12 showed consolidation in left lower lobe and lingula with a small left parapneumonic effusion but no evidence of pulmonary embolism. CT abdomen showed small nonobstructive bilateral renal calculi and sigmoid colon diverticulosis and a left adrenal nodule consistent with adenoma. ANA was noted to be strongly positive at 1 in 1280 , RA factor was mildly positive at 18. Double-stranded DNA was positive, and levels were normal as was CCP antibody. ESR was 60 and anti-Smith antibody was negative  He was readmitted on 05/18/12 4 pleuritic chest pain and discharged again on 05/26/12. He underwent thoracentesis on 05/23/12 of 230 mL of monocytic exudative fluid.   He was readmitted on 4/9- 06/10/12 for chest discomfort and shortness of breath - really unchanged from his last discharge .CT angiogram on 06/05/12 showed small subsegmental pulmonary emboli in the right upper lobe, right middle lobe and left lower lobe. A right effusion was noted somewhat loculated and a small left effusion and was layering.  Echo showed normal systolic function with an EF of 55% and no evidence of diastolic dysfunction.  -venous duplex neg, transitioned to Coumadin with Lovenox bridge  - Seen by rheumatology - started on prednisone 40 mg  -Reason for his persistent vomiting and weight loss was unclear -right upper quarter ultrasound, CT abdomen pelvis. Has no evidence of pancreatitis, his lipase is normal, and his LFTs were within normal limits. ?gastroparesis, says it always appears right after large meal   07/05/2012  Quantiferon test was positive >> INH started CXR 07/04/12 - clearing of BL effusions. prednisone 35 mg with 5 mg taper  planned every week (by rheum)  Rpt testing showed neg ANa, pos ds- DNa, neg APLA & ACLA, Ace level was 20    11/11/12  37mFU Pt reports he feels like his 'esophagus' has doubled in diameter. When he coughs he does not have as much resistance. Pt does not cough very frequently, clears throat. denies any wheezing, no chest tx, no fever, no chills, no sweats.  Sleeps with cpap Down to 533mpred  FAA mentioned 'intense pulmonary problems' -he still works as a flHaematologisto cough or dyspnea  03/10/2013 Follow up  Returns for 4 month follow - reports breathing is doing well.  no new complaints.  due for repeat cxr today. Is followed by Rhuematology for SLE , is now on Plaquenil . Is off prednisone.  He has hx of PE, on coumadin, was suppose to stop in Oct 2014 however misunderstood the instructions.  Was on INH for Quantiferon test -positive , has now finished course.  Doing well on CPAP .  Says overall he is doing well. No chest pain, orthopnea, edema or n/v.    Review of Systems  neg for any significant sore throat, dysphagia, itching, sneezing, nasal congestion or excess/ purulent secretions, fever, chills, sweats, unintended wt loss, pleuritic or exertional cp, hempoptysis, orthopnea pnd or change in chronic leg swelling. Also denies presyncope, palpitations, heartburn, abdominal pain, nausea, vomiting, diarrhea or change in bowel or urinary habits, dysuria,hematuria, rash, arthralgias, visual complaints, headache, numbness weakness or ataxia.     Objective:   Physical Exam   Gen. Pleasant, well-nourished, in  no distress ENT - no lesions, no post nasal drip Neck: No JVD, no thyromegaly, no carotid bruits Lungs: no use of accessory muscles, no dullness to percussion, clear without rales or rhonchi  Cardiovascular: Rhythm regular, heart sounds  normal, no murmurs or gallops, no peripheral edema Musculoskeletal: No deformities, no cyanosis or clubbing        Assessment & Plan:

## 2013-03-11 NOTE — Assessment & Plan Note (Signed)
Doing well on CPAP 

## 2013-03-11 NOTE — Assessment & Plan Note (Signed)
PE dx in 05/2012 w/ completed course of coumadin  Echo showed no right sided hrt failure  May stop coumadin now  follow up Dr. Elsworth Soho  In 4 months

## 2013-04-19 DIAGNOSIS — R300 Dysuria: Secondary | ICD-10-CM | POA: Diagnosis not present

## 2013-04-23 DIAGNOSIS — Z09 Encounter for follow-up examination after completed treatment for conditions other than malignant neoplasm: Secondary | ICD-10-CM | POA: Diagnosis not present

## 2013-04-25 DIAGNOSIS — K2270 Barrett's esophagus without dysplasia: Secondary | ICD-10-CM | POA: Diagnosis not present

## 2013-04-25 DIAGNOSIS — K219 Gastro-esophageal reflux disease without esophagitis: Secondary | ICD-10-CM | POA: Diagnosis not present

## 2013-04-25 DIAGNOSIS — Z86010 Personal history of colonic polyps: Secondary | ICD-10-CM | POA: Diagnosis not present

## 2013-05-12 DIAGNOSIS — Z09 Encounter for follow-up examination after completed treatment for conditions other than malignant neoplasm: Secondary | ICD-10-CM | POA: Diagnosis not present

## 2013-05-12 DIAGNOSIS — M19049 Primary osteoarthritis, unspecified hand: Secondary | ICD-10-CM | POA: Diagnosis not present

## 2013-05-12 DIAGNOSIS — M25549 Pain in joints of unspecified hand: Secondary | ICD-10-CM | POA: Diagnosis not present

## 2013-05-12 DIAGNOSIS — M329 Systemic lupus erythematosus, unspecified: Secondary | ICD-10-CM | POA: Diagnosis not present

## 2013-05-14 DIAGNOSIS — R609 Edema, unspecified: Secondary | ICD-10-CM | POA: Diagnosis not present

## 2013-05-29 ENCOUNTER — Encounter: Payer: Self-pay | Admitting: Family

## 2013-06-02 ENCOUNTER — Ambulatory Visit (HOSPITAL_COMMUNITY)
Admission: RE | Admit: 2013-06-02 | Discharge: 2013-06-02 | Disposition: A | Discharge status: Home / Self Care | Payer: Medicare Other | Source: Ambulatory Visit | Attending: Family | Admitting: Family

## 2013-06-02 ENCOUNTER — Encounter: Payer: Self-pay | Admitting: Family

## 2013-06-02 ENCOUNTER — Ambulatory Visit (INDEPENDENT_AMBULATORY_CARE_PROVIDER_SITE_OTHER): Payer: Medicare Other | Admitting: Family

## 2013-06-02 VITALS — BP 153/82 | HR 58 | Resp 14 | Ht 69.0 in | Wt 225.0 lb

## 2013-06-02 DIAGNOSIS — I714 Abdominal aortic aneurysm, without rupture, unspecified: Secondary | ICD-10-CM

## 2013-06-02 NOTE — Patient Instructions (Signed)

## 2013-06-02 NOTE — Progress Notes (Signed)
[facility]   Established Abdominal Aortic Aneurysm  History of Present Illness  Tyler Norris is a 79 y.o. ([birth year]) male patient of Dr. Donnetta Hutching who presents with chief complaint: follow up for AAA.  Previous studies demonstrate an AAA, measuring 3.0 cm.   The patient does not have back or abdominal pain.  The patient is not a smoker. The patient denies claudication in legs with walking. The patient denies history of stroke or TIA symptoms. He states that he is being treated for SLE, but denies symptoms form this now, previously he had tightness in both upper arms. He not longer takes coumadin, was taking for PE. He is exercising at least 3 days/week in a gym.  Pt Diabetic: No Pt smoker: non-smoker  Past Medical History  Diagnosis Date  . Kidney stones   . Hyperlipidemia   . GERD (gastroesophageal reflux disease)   . Carpal tunnel syndrome, bilateral   . Sleep apnea     uses cpap every night  . AAA (abdominal aortic aneurysm)     resolved by ct scan 09-2011  . Arthritis   . Anemia   . Barrett's esophagus   . Hypertension   . Pneumonia    Past Surgical History  Procedure Laterality Date  . Appendectomy    . Hernia repair    . Hand surgery      scar repaired lt hand  . Tonsillectomy    . Cystoscopy w/ stone manipulation  2001,2009  . Carpal tunnel release  10/12/2011    Procedure: CARPAL TUNNEL RELEASE;  Surgeon: Cammie Sickle., MD;  Location: [facility];  Service: Orthopedics;  Laterality: Left;  . Carpal tunnel release  12/01/2011    Procedure: CARPAL TUNNEL RELEASE;  Surgeon: Cammie Sickle., MD;  Location: [facility];  Service: Orthopedics;  Laterality: Right;  . Knee arthroscopy Left    Social History History   Social History  . Marital Status: Married    Spouse Name: N/A    Number of Children: N/A  . Years of Education: N/A   Occupational History  . Not on file.   Social History Main  Topics  . Smoking status: Never Smoker   . Smokeless tobacco: Never Used  . Alcohol Use: No  . Drug Use: No  . Sexual Activity: Not on file   Other Topics Concern  . Not on file   Social History Narrative  . No narrative on file   Family History Family History  Problem Relation Age of Onset  . Heart disease Father   . Hyperlipidemia Father   . Hypertension Father   . Other Father     VARICOSE VEIN  . Peripheral vascular disease Father     Current Outpatient Prescriptions on File Prior to Visit  Medication Sig Dispense Refill  . Bioflavonoid Products (BIOFLEX PO) Take 2 tablets by mouth daily.       . calcium-vitamin D (OSCAL WITH D) 500-200 MG-UNIT per tablet Take 1 tablet by mouth daily.      Marland Kitchen esomeprazole (NEXIUM) 40 MG capsule Take 40 mg by mouth daily before breakfast.      . fenofibrate (TRICOR) 145 MG tablet Take 145 mg by mouth daily.      . ferrous sulfate 325 (65 FE) MG tablet Take 325 mg by mouth daily with breakfast.      . folic acid (FOLVITE) 299 MCG tablet Take 400 mcg by mouth daily.      Marland Kitchen  hydroxychloroquine (PLAQUENIL) 200 MG tablet Take 200 mg by mouth 2 (two) times daily.      Marland Kitchen ibuprofen (ADVIL,MOTRIN) 200 MG tablet Take 400-800 mg by mouth every 8 (eight) hours as needed for pain.      . Multiple Vitamin (MULTIVITAMIN WITH MINERALS) TABS Take 1 tablet by mouth daily.      . naproxen sodium (ANAPROX) 220 MG tablet Per bottle as needed      . Omega-3 Fatty Acids (FISH OIL) 1200 MG CAPS Take by mouth daily.      . rosuvastatin (CRESTOR) 5 MG tablet Take 2.5 mg by mouth daily.       . vitamin C (ASCORBIC ACID) 500 MG tablet Take 1,000 mg by mouth daily.       . vitamin E 400 UNIT capsule Take 400 Units by mouth daily.      Marland Kitchen warfarin (COUMADIN) 5 MG tablet 1 tab by mouth on M/Tu/W/Fr/Sat/Sun; take 1 and 1/2 on Thu per Dr March Rummage office       No current facility-administered medications on file prior to visit.   Allergies  Allergen Reactions  . Lipitor  [Atorvastatin Calcium]     Myalgias    ROS: See HPI for pertinent positives and negatives.  Physical Examination  Filed Vitals:   06/02/13 0937  BP: 153/82  Pulse: 58  Resp: 14   Filed Weights   06/02/13 0937  Weight: 225 lb (102.059 kg)   Body mass index is 33.21 kg/(m^2).  General: A&O x 3, WD, obese male.  Pulmonary: Sym exp, good air movt, CTAB, no rales, rhonchi, or wheezing.  Cardiac: RRR, Nl S1, S2, no detected murmur.   Carotid Bruits Left Right   Negative Negative   Aorta is not palpable Radial pulses are 3+ palpable and =.                          VASCULAR EXAM:                                                                                                         LE Pulses LEFT RIGHT       FEMORAL  not palpable  not palpable        POPLITEAL  not palpable   not palpable       POSTERIOR TIBIAL  not palpable   not palpable        DORSALIS PEDIS      ANTERIOR TIBIAL  palpable   palpable     Gastrointestinal: soft, NTND, -G/R, - HSM, - masses, - CVAT B.  Musculoskeletal: M/S 5/5 throughout, Extremities without ischemic changes.  Neurologic: CN 2-12 intact, Pain and light touch intact in extremities are intact, Motor exam as listed above. He has a right eye tick.  Non-Invasive Vascular Imaging  AAA Duplex (06/02/2013) ABDOMINAL AORTA DUPLEX EVALUATION    INDICATION: Follow-up possible abdominal aortic aneurysm     PREVIOUS INTERVENTION(S):     DUPLEX EXAM:     LOCATION DIAMETER AP (cm) DIAMETER TRANSVERSE (  cm) VELOCITIES (cm/sec)  Aorta Proximal 1.96 1.96 98  Aorta Mid 1.95 2.05 98  Aorta Distal 1.68 1.68 113  Right Common Iliac Artery 1.11 1.13 101  Left Common Iliac Artery 1.35 1.32 120    Previous max aortic diameter:  2.5cm x 2.1cm (proximal) Date: 06/27/2011     ADDITIONAL FINDINGS:     IMPRESSION: Normal study without evidence of aneurysm or ectasia of the abdominal aorta.    Compared to the previous exam:  Previous diameter of  2.5cm could not be replicated on today's exam.     Medical Decision Making  The patient is a 79 y.o. male who presents with normal study without evidence of aneurysm or ectasia of the abdominal aorta.   Based on this patient's exam and diagnostic studies, and Dr. Luther Parody previous progress note, the patient will follow up as needed since there is no evidence of AAA.  Consideration for repair of AAA would be made when the size approaches 4.8 or 5.0 cm, growth > 1 cm/yr, and symptomatic status.  I emphasized the importance of maximal medical management including strict control of blood pressure, blood glucose, and lipid levels, antiplatelet agents, obtaining regular exercise, and cessation of smoking.   The patient was given information about AAA including signs, symptoms, treatment, and how to minimize the risk of enlargement and rupture of aneurysms.    The patient was advised to call 911 should the patient experience sudden onset abdominal or back pain.   Thank you for allowing [place] to participate in this patient's care.  Clemon Chambers, RN, MSN, FNP-C [facility] [place] Office: [facility] Physician: Trula Slade  06/02/2013, 9:07 AM

## 2013-06-03 ENCOUNTER — Ambulatory Visit: Payer: Medicare Other | Admitting: Neurosurgery

## 2013-06-10 DIAGNOSIS — Z86010 Personal history of colonic polyps: Secondary | ICD-10-CM | POA: Diagnosis not present

## 2013-06-10 DIAGNOSIS — K228 Other specified diseases of esophagus: Secondary | ICD-10-CM | POA: Diagnosis not present

## 2013-06-10 DIAGNOSIS — K5730 Diverticulosis of large intestine without perforation or abscess without bleeding: Secondary | ICD-10-CM | POA: Diagnosis not present

## 2013-06-10 DIAGNOSIS — K2289 Other specified disease of esophagus: Secondary | ICD-10-CM | POA: Diagnosis not present

## 2013-06-10 DIAGNOSIS — K648 Other hemorrhoids: Secondary | ICD-10-CM | POA: Diagnosis not present

## 2013-06-10 DIAGNOSIS — K209 Esophagitis, unspecified without bleeding: Secondary | ICD-10-CM | POA: Diagnosis not present

## 2013-06-10 DIAGNOSIS — D126 Benign neoplasm of colon, unspecified: Secondary | ICD-10-CM | POA: Diagnosis not present

## 2013-06-10 DIAGNOSIS — K2270 Barrett's esophagus without dysplasia: Secondary | ICD-10-CM | POA: Diagnosis not present

## 2013-06-10 DIAGNOSIS — Z09 Encounter for follow-up examination after completed treatment for conditions other than malignant neoplasm: Secondary | ICD-10-CM | POA: Diagnosis not present

## 2013-06-10 DIAGNOSIS — K219 Gastro-esophageal reflux disease without esophagitis: Secondary | ICD-10-CM | POA: Diagnosis not present

## 2013-07-28 DIAGNOSIS — Z79899 Other long term (current) drug therapy: Secondary | ICD-10-CM | POA: Diagnosis not present

## 2013-08-05 DIAGNOSIS — E785 Hyperlipidemia, unspecified: Secondary | ICD-10-CM | POA: Diagnosis not present

## 2013-08-12 ENCOUNTER — Telehealth: Payer: Self-pay | Admitting: Pulmonary Disease

## 2013-08-12 DIAGNOSIS — E785 Hyperlipidemia, unspecified: Secondary | ICD-10-CM | POA: Diagnosis not present

## 2013-08-12 DIAGNOSIS — M329 Systemic lupus erythematosus, unspecified: Secondary | ICD-10-CM | POA: Diagnosis not present

## 2013-08-12 DIAGNOSIS — Z79899 Other long term (current) drug therapy: Secondary | ICD-10-CM | POA: Diagnosis not present

## 2013-08-12 NOTE — Telephone Encounter (Signed)
Needs FU OV with me or TP to address

## 2013-08-12 NOTE — Telephone Encounter (Signed)
According to form: #6 states: Pt needs a detailed current status report from Dr. Elsworth Soho regarding his history of restrictive lung disease and TB. The report should address interim hx and symptoms, diagnosis, treatment and follow up plans, current medication (name, dosage, frequency of use and side effects), and prognosis. #7: the results of current PFT's.   Please advise Dr. Elsworth Soho thanks

## 2013-08-12 NOTE — Telephone Encounter (Signed)
Called and spoke to pt. Informed pt that he would need to come in for a f/u visit to go over form. Pt stated he would call back in the morning when he has his calendar. Appt time pt was thinking about was 08/18/2013 at 2:30pm with TP. Will await call.

## 2013-08-14 DIAGNOSIS — Z79899 Other long term (current) drug therapy: Secondary | ICD-10-CM | POA: Diagnosis not present

## 2013-08-14 DIAGNOSIS — R300 Dysuria: Secondary | ICD-10-CM | POA: Diagnosis not present

## 2013-08-14 DIAGNOSIS — E785 Hyperlipidemia, unspecified: Secondary | ICD-10-CM | POA: Diagnosis not present

## 2013-08-14 NOTE — Telephone Encounter (Signed)
Appt set for 08-18-13 at 2:30. Mindy do you still have these forms? They need to go to [facility] for the appt. Thanks.

## 2013-08-14 NOTE — Telephone Encounter (Signed)
The forms were already giving to [place]. Will forward to [place] as an [place] OF APPT

## 2013-08-18 ENCOUNTER — Encounter: Payer: Self-pay | Admitting: Adult Health

## 2013-08-18 ENCOUNTER — Ambulatory Visit (INDEPENDENT_AMBULATORY_CARE_PROVIDER_SITE_OTHER): Payer: Medicare Other | Admitting: Adult Health

## 2013-08-18 VITALS — BP 124/70 | HR 65 | Temp 98.0°F | Ht 69.5 in | Wt 244.0 lb

## 2013-08-18 DIAGNOSIS — G4733 Obstructive sleep apnea (adult) (pediatric): Secondary | ICD-10-CM

## 2013-08-18 DIAGNOSIS — Z9989 Dependence on other enabling machines and devices: Secondary | ICD-10-CM

## 2013-08-18 DIAGNOSIS — I2699 Other pulmonary embolism without acute cor pulmonale: Secondary | ICD-10-CM

## 2013-08-18 NOTE — Patient Instructions (Signed)
Paperwork completed today, will call when rest of files have arrived  Continue on current regimen .  Follow up Dr. Elsworth Soho  In 2-3  months and As needed

## 2013-08-18 NOTE — Progress Notes (Signed)
Subjective:    Patient ID: Tyler Norris, male    DOB: Mar 31, 1934, 79 y.o.   MRN: [id number]  HPI    79 year old never smoker for FU of bilateral pleural effusions & 30 pound weight loss.  He was hospitalized and treated for community-acquired pneumonia from 05/13/12 to 3/ 19/14. CT chest on 05/13/12 showed consolidation in left lower lobe and lingula with a small left parapneumonic effusion but no evidence of pulmonary embolism. CT abdomen showed small nonobstructive bilateral renal calculi and sigmoid colon diverticulosis and a left adrenal nodule consistent with adenoma. ANA was noted to be strongly positive at 1 in 1280 , RA factor was mildly positive at 18. Double-stranded DNA was positive, and levels were normal as was CCP antibody. ESR was 60 and anti-Smith antibody was negative  He was readmitted on 05/18/12 4 pleuritic chest pain and discharged again on 05/26/12. He underwent thoracentesis on 05/23/12 of 230 mL of monocytic exudative fluid.   He was readmitted on 4/9- 06/10/12 for chest discomfort and shortness of breath - really unchanged from his last discharge .CT angiogram on 06/05/12 showed small subsegmental pulmonary emboli in the right upper lobe, right middle lobe and left lower lobe. A right effusion was noted somewhat loculated and a small left effusion and was layering.  Echo showed normal systolic function with an EF of 55% and no evidence of diastolic dysfunction.  -venous duplex neg, transitioned to Coumadin with Lovenox bridge  - Seen by rheumatology - started on prednisone 40 mg  -Reason for his persistent vomiting and weight loss was unclear -right upper quarter ultrasound, CT abdomen pelvis. Has no evidence of pancreatitis, his lipase is normal, and his LFTs were within normal limits. ?gastroparesis, says it always appears right after large meal   07/05/2012  Quantiferon test was positive >> INH started CXR 07/04/12 - clearing of BL effusions. prednisone 35 mg with 5 mg taper  planned every week (by rheum)  Rpt testing showed neg ANa, pos ds- DNa, neg APLA & ACLA, Ace level was 20    11/11/12  9mFU Pt reports he feels like his 'esophagus' has doubled in diameter. When he coughs he does not have as much resistance. Pt does not cough very frequently, clears throat. denies any wheezing, no chest tx, no fever, no chills, no sweats.  Sleeps with cpap Down to 547mpred  FAA mentioned 'intense pulmonary problems' -he still works as a flHaematologisto cough or dyspnea  03/10/13 Follow up  Returns for 4 month follow - reports breathing is doing well.  no new complaints.  due for repeat cxr today. Is followed by Rhuematology for SLE , is now on Plaquenil . Is off prednisone.  He has hx of PE, on coumadin, was suppose to stop in Oct 2014 however misunderstood the instructions.  Was on INH for Quantiferon test -positive , has now finished course.  Doing well on CPAP .  Says overall he is doing well. No chest pain, orthopnea, edema or n/v.   08/18/2013 Follow up  Pt returns for follow up for OSA , hx of PE. 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   Needs CPAP download ,  Wears CPAP  most nights , usually 5-6 hr . No naps, no daytime sleepiness.   Needs copy of recent PFT . Request sent   Hx of PE , finished coumadin course since 02/2013 .  Previous echo 04/2012 >  EF 55-60%, no evidence of right heart strain.   Is followed by Rhuematology for SLE , is on Plaquenil . Off prednisone.    Was on INH for Quantiferon test -positive , has now finished course from 12/2012 .  Finished 6 month course.  CXR done 06/2012 with no acute findings, no evidence of TB   Overall says he is doing well. No chest pain , orthopnea, edema or fever.     Review of Systems  neg for any significant sore throat, dysphagia, itching, sneezing, nasal congestion or excess/ purulent secretions, fever, chills, sweats,  unintended wt loss, pleuritic or exertional cp, hempoptysis, orthopnea pnd or change in chronic leg swelling. Also denies presyncope, palpitations, heartburn, abdominal pain, nausea, vomiting, diarrhea or change in bowel or urinary habits, dysuria,hematuria, rash, arthralgias, visual complaints, headache, numbness weakness or ataxia.     Objective:   Physical Exam   Gen. Pleasant, well-nourished, in no distress ENT - no lesions, no post nasal drip Neck: No JVD, no thyromegaly, no carotid bruits Lungs: no use of accessory muscles, no dullness to percussion, clear without rales or rhonchi  Cardiovascular: Rhythm regular, heart sounds  normal, no murmurs or gallops, no peripheral edema Musculoskeletal: No deformities, no cyanosis or clubbing        Assessment & Plan:

## 2013-08-26 NOTE — Assessment & Plan Note (Signed)
Needs CPAP download ,  Wears CPAP  most nights , usually 5-6 hr . No naps, no daytime sleepiness.

## 2013-08-26 NOTE — Assessment & Plan Note (Signed)
Hx of PE , finished coumadin course since 02/2013 .  Previous echo 04/2012 > EF 55-60%, no evidence of right heart strain.

## 2013-09-03 DIAGNOSIS — I2510 Atherosclerotic heart disease of native coronary artery without angina pectoris: Secondary | ICD-10-CM | POA: Diagnosis not present

## 2013-09-16 ENCOUNTER — Ambulatory Visit: Payer: Medicare Other | Admitting: Pulmonary Disease

## 2013-09-16 ENCOUNTER — Telehealth: Payer: Self-pay | Admitting: Pulmonary Disease

## 2013-09-16 DIAGNOSIS — E119 Type 2 diabetes mellitus without complications: Secondary | ICD-10-CM | POA: Diagnosis not present

## 2013-09-16 NOTE — Telephone Encounter (Signed)
Pt is waiting in lobby.

## 2013-09-16 NOTE — Telephone Encounter (Signed)
LM with Wife for pt to return call

## 2013-09-16 NOTE — Telephone Encounter (Signed)
Spoke with patient in lobby Aware that we have the results of his breathing test from 12/2012 Pt advised to keep follow up visit with Dr Elsworth Soho 09/17/13 at 1:45 - discuss the requirements of his upcoming Exam/Physical at that time Results have been printed from PFT and given to [facility] for the OV tomorrow.  Pt will be given these results tomorrow at his OV.   Will send to Mindy as FYI.  Nothing further needed.

## 2013-09-17 ENCOUNTER — Ambulatory Visit (INDEPENDENT_AMBULATORY_CARE_PROVIDER_SITE_OTHER): Payer: Medicare Other | Admitting: Pulmonary Disease

## 2013-09-17 ENCOUNTER — Ambulatory Visit (INDEPENDENT_AMBULATORY_CARE_PROVIDER_SITE_OTHER)
Admission: RE | Admit: 2013-09-17 | Discharge: 2013-09-17 | Disposition: A | Discharge status: Home / Self Care | Payer: Medicare Other | Source: Ambulatory Visit | Attending: Pulmonary Disease | Admitting: Pulmonary Disease

## 2013-09-17 ENCOUNTER — Encounter: Payer: Self-pay | Admitting: Pulmonary Disease

## 2013-09-17 VITALS — BP 134/80 | HR 74 | Temp 97.3°F | Ht 69.5 in | Wt 229.6 lb

## 2013-09-17 DIAGNOSIS — J90 Pleural effusion, not elsewhere classified: Secondary | ICD-10-CM | POA: Diagnosis not present

## 2013-09-17 DIAGNOSIS — I2699 Other pulmonary embolism without acute cor pulmonale: Secondary | ICD-10-CM

## 2013-09-17 DIAGNOSIS — I10 Essential (primary) hypertension: Secondary | ICD-10-CM | POA: Diagnosis not present

## 2013-09-17 DIAGNOSIS — Z9989 Dependence on other enabling machines and devices: Secondary | ICD-10-CM

## 2013-09-17 DIAGNOSIS — G4733 Obstructive sleep apnea (adult) (pediatric): Secondary | ICD-10-CM | POA: Diagnosis not present

## 2013-09-17 DIAGNOSIS — Z0000 Encounter for general adult medical examination without abnormal findings: Secondary | ICD-10-CM | POA: Diagnosis not present

## 2013-09-17 NOTE — Progress Notes (Signed)
Subjective:    Patient ID: Tyler Norris, male    DOB: February 03, 1935, 79 y.o.   MRN: [id number]  HPI  79 year old never smoker with SLE diagnosed 2014 for FU of OSA & pulmonary embolism  Significant tests/ events  He was hospitalized and treated for community-acquired pneumonia from 05/13/12 to 3/ 19/14. CT chest on 05/13/12 showed consolidation in left lower lobe and lingula with a small left parapneumonic effusion but no evidence of pulmonary embolism. CT abdomen showed small nonobstructive bilateral renal calculi and sigmoid colon diverticulosis and a left adrenal nodule consistent with adenoma. ANA was noted to be strongly positive at 1 in 1280 , RA factor was mildly positive at 18. Double-stranded DNA was positive, and levels were normal as was CCP antibody. ESR was 60 and anti-Smith antibody was negative  He was readmitted on 05/18/12 4 pleuritic chest pain and discharged again on 05/26/12. He underwent thoracentesis on 05/23/12 of 230 mL of monocytic exudative fluid.  He was readmitted on 4/9- 06/10/12 for chest discomfort and shortness of breath - really unchanged from his last discharge .CT angiogram on 06/05/12 showed small subsegmental pulmonary emboli in the right upper lobe, right middle lobe and left lower lobe. A right effusion was noted somewhat loculated and a small left effusion and was layering.  Echo showed normal systolic function with an EF of 55% and no evidence of diastolic dysfunction.  -venous duplex neg   09/17/2013  Chief Complaint  Patient presents with  . Follow-up    Pt is trying to get medical certificate approved by [place]. pt needs a current status report regarding his OSA and results of recent PFT (needs to have done in a 60 day period not PFT from 2014). Pt reports he still uses CPAP machine every night. Pt has not had recent CPAP download.     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  Needs  CPAP download ,  Wears CPAP most nights , usually 5-6 hr . No naps, no daytime sleepiness.  Needs copy of recent PFT . Request sent  Hx of PE , finished coumadin course since 02/2013 .  Previous echo 04/2012 > EF 55-60%, no evidence of right heart strain.  Is followed by Rhuematology for SLE , is on Plaquenil . Off prednisone.  Was on INH for Quantiferon test -positive , has now finished course from 12/2012 .  Finished 6 month course.  CXR 09/18/13 with no acute findings, no evidence of TB , effusions have resolved Overall says he is doing well. No chest pain , orthopnea, edema or fever.  Wakes up rested , no snoring , compliant with CPAP    Review of Systems neg for any significant sore throat, dysphagia, itching, sneezing, nasal congestion or excess/ purulent secretions, fever, chills, sweats, unintended wt loss, pleuritic or exertional cp, hempoptysis, orthopnea pnd or change in chronic leg swelling. Also denies presyncope, palpitations, heartburn, abdominal pain, nausea, vomiting, diarrhea or change in bowel or urinary habits, dysuria,hematuria, rash, arthralgias, visual complaints, headache, numbness weakness or ataxia.     Objective:   Physical Exam  Gen. Pleasant,  in no distress ENT - no lesions, no post nasal drip Neck: No JVD, no thyromegaly, no carotid bruits Lungs: no use of accessory muscles, no dullness to percussion, decreased without rales or rhonchi  Cardiovascular: Rhythm regular, heart sounds  normal, no murmurs or gallops, no peripheral edema Musculoskeletal: No deformities, no cyanosis or clubbing ,  no tremors       Assessment & Plan:

## 2013-09-17 NOTE — Patient Instructions (Signed)
CXR today Schedule PFTs We will ask DME to obtain CPPA download - needs new DME

## 2013-09-17 NOTE — Assessment & Plan Note (Signed)
We will ask DME to obtain CPPA download - needs new DME He does not report daytime fatigue or sleepiness

## 2013-09-17 NOTE — Assessment & Plan Note (Addendum)
-  resolved on CXR Schedule PFTs for FAA purposes

## 2013-09-18 ENCOUNTER — Ambulatory Visit (INDEPENDENT_AMBULATORY_CARE_PROVIDER_SITE_OTHER): Payer: Medicare Other | Admitting: Pulmonary Disease

## 2013-09-18 DIAGNOSIS — I2699 Other pulmonary embolism without acute cor pulmonale: Secondary | ICD-10-CM | POA: Diagnosis not present

## 2013-09-18 NOTE — Assessment & Plan Note (Signed)
Off coumadin since 02/2013

## 2013-09-18 NOTE — Progress Notes (Signed)
Quick Note:  Called and spoke to pt. Informed pt of results per RA. Pt verbalized understanding and denied any other questions or concerns at this time. ______

## 2013-09-18 NOTE — Progress Notes (Signed)
PFT done today. 

## 2013-09-23 ENCOUNTER — Telehealth: Payer: Self-pay | Admitting: Pulmonary Disease

## 2013-09-23 LAB — PULMONARY FUNCTION TEST
DL/VA % pred: 102 %
DL/VA: 4.56 ml/min/mmHg/L
DLCO UNC % PRED: 70 %
DLCO UNC: 20.85 ml/min/mmHg
FEF 25-75 POST: 1.89 L/s
FEF 25-75 Pre: 1.4 L/sec
FEF2575-%Change-Post: 35 %
FEF2575-%Pred-Post: 97 %
FEF2575-%Pred-Pre: 72 %
FEV1-%Change-Post: 6 %
FEV1-%Pred-Post: 84 %
FEV1-%Pred-Pre: 79 %
FEV1-Post: 2.31 L
FEV1-Pre: 2.16 L
FEV1FVC-%Change-Post: 7 %
FEV1FVC-%Pred-Pre: 100 %
FEV6-%Change-Post: 0 %
FEV6-%PRED-PRE: 83 %
FEV6-%Pred-Post: 83 %
FEV6-Post: 2.96 L
FEV6-Pre: 2.97 L
FEV6FVC-%CHANGE-POST: 0 %
FEV6FVC-%PRED-PRE: 106 %
FEV6FVC-%Pred-Post: 107 %
FVC-%CHANGE-POST: 0 %
FVC-%Pred-Post: 77 %
FVC-%Pred-Pre: 78 %
FVC-Post: 2.96 L
FVC-Pre: 2.99 L
POST FEV6/FVC RATIO: 100 %
PRE FEV1/FVC RATIO: 72 %
Post FEV1/FVC ratio: 78 %
Pre FEV6/FVC Ratio: 99 %
RV % PRED: 60 %
RV: 1.5 L
TLC % PRED: 70 %
TLC: 4.68 L

## 2013-09-23 NOTE — Telephone Encounter (Signed)
I have sent [facility] from [facility] a staff message.  Have not received results yet.

## 2013-09-23 NOTE — Telephone Encounter (Signed)
Notes Recorded by Rigoberto Noel, MD on 09/22/2013 at 7:37 AM Lung function is improved although not perfect. Await CPAP download , then can give him letter.  Results have been explained to patient, pt expressed understanding.  Mindy please advise if you have seen these CPAP results. Thanks.

## 2013-09-24 ENCOUNTER — Telehealth: Payer: Self-pay | Admitting: Pulmonary Disease

## 2013-09-24 NOTE — Telephone Encounter (Signed)
Pt took his cpap in and they did a download    [phone]

## 2013-09-24 NOTE — Telephone Encounter (Signed)
Message created in error.  Tyler Norris

## 2013-09-25 NOTE — Telephone Encounter (Signed)
Jiles Crocker [place], CMA            Pt was to come in today for the dl. I will let our RT Dpt know that it needs to be faxed over. Thanks

## 2013-09-26 ENCOUNTER — Encounter: Payer: Self-pay | Admitting: *Deleted

## 2013-09-26 NOTE — Telephone Encounter (Signed)
Letter has been done and placed upfront for pick up. Pt aware and nothing further needed

## 2013-09-26 NOTE — Telephone Encounter (Signed)
I have contacted our RT Dpt about this. They are trying to find out what happened. I will keep you posted. Thanks!

## 2013-09-26 NOTE — Telephone Encounter (Signed)
Jonni Sanger w/ [facility] has faxed download to [phone].  Please call w/ any questions to [phone].  Tyler Norris

## 2013-09-26 NOTE — Telephone Encounter (Signed)
Can give letter to pt as below -  To Whom It May Concern  Mr. Tyler Norris, date of birth 06/03/1934, is under my care for management of pulmonary embolism, obstructive sleep apnea and restrictive lung disease due to pleural effusions.  Pulmonary embolism was diagnosed in 05/2012, and he completed Coumadin course in January 2015 and is now off anti-coagulation.  Obstructive sleep apnea is being treated with CPAP of 9 cm. A 3 month download from 09/23/13 shows excellent compliance with 6 hours 45 minutes of usage daily without any missed days and good control of events with average AHI of 1.3 per hour which is quite acceptable. He denies excessive daytime somnolence or snoring.  Pulmonary function testing from 09/18/13 shows significant improvement from prior. This only shows mild restriction with FVC of 78%, and DLCO of 70%. His pleural effusions have resolved on chest x-ray and seemed to have been a result of systemic lupus which is now under treatment.  Overall he is much improved and could be cleared from a pulmonary standpoint.Please do not hesitate to contact me for any questions.

## 2013-09-26 NOTE — Telephone Encounter (Signed)
Download received and gave to RA

## 2013-09-29 DIAGNOSIS — M19049 Primary osteoarthritis, unspecified hand: Secondary | ICD-10-CM | POA: Diagnosis not present

## 2013-09-29 DIAGNOSIS — M329 Systemic lupus erythematosus, unspecified: Secondary | ICD-10-CM | POA: Diagnosis not present

## 2013-09-29 DIAGNOSIS — M25549 Pain in joints of unspecified hand: Secondary | ICD-10-CM | POA: Diagnosis not present

## 2013-09-29 DIAGNOSIS — M25579 Pain in unspecified ankle and joints of unspecified foot: Secondary | ICD-10-CM | POA: Diagnosis not present

## 2013-10-02 ENCOUNTER — Telehealth: Payer: Self-pay | Admitting: Pulmonary Disease

## 2013-10-02 NOTE — Telephone Encounter (Signed)
DL received from [place] spoke with patient to make him aware fax was received -- he will come 8/6 or 8/10 to pick up this information.  He is also aware to go to the [facility] office if originals are required by the [facility] rather than faxed copy that we received.  Pt voiced his understanding in this.  Nothing further needed; will sign off.

## 2013-10-02 NOTE — Telephone Encounter (Signed)
Went to lobby to speak with patient who reported that he did receive the letter done by RA and is grateful for this.  However, after taking this to his physician designated by the [facility] he was told that the [place] requires the actual data pulled from the chip from his CPAP.  Pt would like to pick this up when ready:  Primary # [phone], secondary [place] w/ [facility] and spoke with Otila Kluver.  She reported that a DL was faxed on 7.31.15 - informed her that we did receive this but did not give the pt the actual data, just put the info in letter format.  Otila Kluver verbalized her understanding and will refax April-July download to the triage fax machine.  Will await this and call pt when he can pick this up.  Pt did ask that if the [place] asks him to obtain this data from the DME company rather than the physician's office, what location would he go to.  Asked Otila Kluver this question and she replied he would go to the [employer] and ask for [employer].  Will inform pt of this when I speak with him about the DL.  Will hold in triage until DL is received.

## 2013-10-13 ENCOUNTER — Telehealth: Payer: Self-pay | Admitting: Pulmonary Disease

## 2013-10-13 NOTE — Telephone Encounter (Signed)
download received and placed for pick up  lmomtcb x1 for pt

## 2013-10-13 NOTE — Telephone Encounter (Signed)
Pt aware. Nothing further needed 

## 2013-10-13 NOTE — Telephone Encounter (Signed)
Pt requetsing most recent download. He does not recall what he did with his last one. I sent [facility]  Message to fax this over to me

## 2013-10-13 NOTE — Telephone Encounter (Signed)
[phone] returning a call

## 2013-10-22 ENCOUNTER — Encounter: Payer: Self-pay | Admitting: Pulmonary Disease

## 2013-10-22 ENCOUNTER — Ambulatory Visit (INDEPENDENT_AMBULATORY_CARE_PROVIDER_SITE_OTHER): Payer: Medicare Other | Admitting: Pulmonary Disease

## 2013-10-22 VITALS — BP 140/78 | HR 61 | Ht 69.0 in | Wt 225.0 lb

## 2013-10-22 DIAGNOSIS — G4733 Obstructive sleep apnea (adult) (pediatric): Secondary | ICD-10-CM

## 2013-10-22 NOTE — Progress Notes (Signed)
   Subjective:    Patient ID: Tyler Norris, male    DOB: 26-Mar-1934, 79 y.o.   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

## 2013-12-23 DIAGNOSIS — E559 Vitamin D deficiency, unspecified: Secondary | ICD-10-CM | POA: Diagnosis not present

## 2013-12-23 DIAGNOSIS — R300 Dysuria: Secondary | ICD-10-CM | POA: Diagnosis not present

## 2013-12-23 DIAGNOSIS — Z79899 Other long term (current) drug therapy: Secondary | ICD-10-CM | POA: Diagnosis not present

## 2013-12-23 DIAGNOSIS — M2550 Pain in unspecified joint: Secondary | ICD-10-CM | POA: Diagnosis not present

## 2013-12-23 DIAGNOSIS — E119 Type 2 diabetes mellitus without complications: Secondary | ICD-10-CM | POA: Diagnosis not present

## 2013-12-25 DIAGNOSIS — Z23 Encounter for immunization: Secondary | ICD-10-CM | POA: Diagnosis not present

## 2014-01-09 DIAGNOSIS — E559 Vitamin D deficiency, unspecified: Secondary | ICD-10-CM | POA: Diagnosis not present

## 2014-01-09 DIAGNOSIS — R7302 Impaired glucose tolerance (oral): Secondary | ICD-10-CM | POA: Diagnosis not present

## 2014-01-09 DIAGNOSIS — E785 Hyperlipidemia, unspecified: Secondary | ICD-10-CM | POA: Diagnosis not present

## 2014-01-09 DIAGNOSIS — M1990 Unspecified osteoarthritis, unspecified site: Secondary | ICD-10-CM | POA: Diagnosis not present

## 2014-02-09 ENCOUNTER — Ambulatory Visit (INDEPENDENT_AMBULATORY_CARE_PROVIDER_SITE_OTHER): Payer: Medicare Other | Admitting: Adult Health

## 2014-02-09 ENCOUNTER — Encounter: Payer: Self-pay | Admitting: Adult Health

## 2014-02-09 VITALS — BP 128/74 | HR 70 | Temp 98.0°F

## 2014-02-09 DIAGNOSIS — G4733 Obstructive sleep apnea (adult) (pediatric): Secondary | ICD-10-CM | POA: Diagnosis not present

## 2014-02-09 DIAGNOSIS — Z9989 Dependence on other enabling machines and devices: Principal | ICD-10-CM

## 2014-02-09 NOTE — Patient Instructions (Signed)
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  Follow up Dr. Elsworth Soho  In 1 year and As needed

## 2014-02-09 NOTE — Progress Notes (Signed)
Reviewed & agree with plan  

## 2014-02-09 NOTE — Assessment & Plan Note (Signed)
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  Follow up Dr. Elsworth Soho  In 1 year and As needed

## 2014-02-09 NOTE — Progress Notes (Signed)
Subjective:    Patient ID: Tyler Norris, male    DOB: 1934/12/29, 79 y.o.   MRN: [id number]  HPI  79 year old never smoker with SLE diagnosed 2014 for FU of OSA & pulmonary embolism  Significant tests/ events  He was hospitalized and treated for community-acquired pneumonia from 05/13/12 to 3/ 19/14. CT chest on 05/13/12 showed consolidation in left lower lobe and lingula with a small left parapneumonic effusion but no evidence of pulmonary embolism. CT abdomen showed small nonobstructive bilateral renal calculi and sigmoid colon diverticulosis and a left adrenal nodule consistent with adenoma. ANA was noted to be strongly positive at 1 in 1280 , RA factor was mildly positive at 18. Double-stranded DNA was positive, and levels were normal as was CCP antibody. ESR was 60 and anti-Smith antibody was negative  He was readmitted on 05/18/12 4 pleuritic chest pain and discharged again on 05/26/12. He underwent thoracentesis on 05/23/12 of 230 mL of monocytic exudative fluid.  He was readmitted on 4/9- 06/10/12 for chest discomfort and shortness of breath - really unchanged from his last discharge .CT angiogram on 06/05/12 showed small subsegmental pulmonary emboli in the right upper lobe, right middle lobe and left lower lobe. A right effusion was noted somewhat loculated and a small left effusion and was layering.  Echo showed normal systolic function with an EF of 55% and no evidence of diastolic dysfunction.  -venous duplex neg   09/17/2013 Chief Complaint  Patient presents with  . Follow-up    Pt is trying to get medical certificate approved by [place]. pt needs a current status report regarding his OSA and results of recent PFT (needs to have done in a 60 day period not PFT from 2014). Pt reports he still uses CPAP machine every night. Pt has not had recent CPAP download.     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  Needs CPAP  download ,  Wears CPAP most nights , usually 5-6 hr . No naps, no daytime sleepiness.  Needs copy of recent PFT . Request sent  Hx of PE , finished coumadin course since 02/2013 .  Previous echo 04/2012 > EF 55-60%, no evidence of right heart strain.  Is followed by Rhuematology for SLE , is on Plaquenil . Off prednisone.  Was on INH for Quantiferon test -positive , has now finished course from 12/2012 .  Finished 6 month course.  CXR 09/18/13 with no acute findings, no evidence of TB , effusions have resolved Overall says he is doing well. No chest pain , orthopnea, edema or fever.  Wakes up rested , no snoring , compliant with CPAP   02/09/2014 Follow up OSA  Returns for 4 month follow up for OSA.  Wears CPAP every night  -avg 7 hr No naps, no daytime sleepiness.  Doing well with mask . No issues with leaks.  Uses nasal pillows.  Feels well rested .   Hx of PE , finished coumadin course since 02/2013 .  Previous echo 04/2012 > EF 55-60%, no evidence of right heart strain.  Is followed by Rhuematology for SLE , is on Plaquenil . Off prednisone.  Was on INH for Quantiferon test -positive , has now finished course from 12/2012 .      Review of Systems neg for any significant sore throat, dysphagia, itching, sneezing, nasal congestion or excess/ purulent secretions, fever, chills, sweats, unintended wt loss, pleuritic or exertional cp, hempoptysis,  orthopnea pnd or change in chronic leg swelling. Also denies presyncope, palpitations, heartburn, abdominal pain, nausea, vomiting, diarrhea or change in bowel or urinary habits, dysuria,hematuria, rash, arthralgias, visual complaints, headache, numbness weakness or ataxia.     Objective:   Physical Exam  Gen. Pleasant,  in no distress ENT - no lesions, no post nasal drip Neck: No JVD, no thyromegaly, no carotid bruits Lungs: no use of accessory muscles, no dullness to percussion, decreased without rales or rhonchi  Cardiovascular: Rhythm  regular, heart sounds  normal, no murmurs or gallops, no peripheral edema Musculoskeletal: No deformities, no cyanosis or clubbing , no tremors       Assessment & Plan:

## 2014-03-02 DIAGNOSIS — Z79899 Other long term (current) drug therapy: Secondary | ICD-10-CM | POA: Diagnosis not present

## 2014-03-02 DIAGNOSIS — M25562 Pain in left knee: Secondary | ICD-10-CM | POA: Diagnosis not present

## 2014-03-02 DIAGNOSIS — M329 Systemic lupus erythematosus, unspecified: Secondary | ICD-10-CM | POA: Diagnosis not present

## 2014-03-02 DIAGNOSIS — M25572 Pain in left ankle and joints of left foot: Secondary | ICD-10-CM | POA: Diagnosis not present

## 2014-03-04 DIAGNOSIS — M2550 Pain in unspecified joint: Secondary | ICD-10-CM | POA: Diagnosis not present

## 2014-04-07 DIAGNOSIS — H6692 Otitis media, unspecified, left ear: Secondary | ICD-10-CM | POA: Diagnosis not present

## 2014-04-08 DIAGNOSIS — H109 Unspecified conjunctivitis: Secondary | ICD-10-CM | POA: Diagnosis not present

## 2014-04-22 DIAGNOSIS — K1379 Other lesions of oral mucosa: Secondary | ICD-10-CM | POA: Diagnosis not present

## 2014-05-08 ENCOUNTER — Telehealth: Payer: Self-pay | Admitting: Pulmonary Disease

## 2014-05-08 DIAGNOSIS — Z79899 Other long term (current) drug therapy: Secondary | ICD-10-CM | POA: Diagnosis not present

## 2014-05-08 DIAGNOSIS — E780 Pure hypercholesterolemia: Secondary | ICD-10-CM | POA: Diagnosis not present

## 2014-05-08 DIAGNOSIS — M1990 Unspecified osteoarthritis, unspecified site: Secondary | ICD-10-CM | POA: Diagnosis not present

## 2014-05-08 NOTE — Telephone Encounter (Signed)
Patient brought me a form this morning for Medical Authorization for his [place] certification.  Pt needs OV, PFT, Download (according to letter); his last PFT was on 09/18/13.  I believe the [place] require patient to have PFT yearly.  Patient's letter says that his [place] certification expires 05/28/14.  Pt was scheduled to see you in [place] on 06/11/14.  We do not have the ability to do PFT in [place] office, would you like me to have him scheduled to do PFT prior to [place].  And do I need to get patient in sooner since his certification runs out 05/28/14?  Please advise.

## 2014-05-08 NOTE — Telephone Encounter (Signed)
Pl schedule pFT at [place] office & appt before 3/31 with download

## 2014-05-11 DIAGNOSIS — M25561 Pain in right knee: Secondary | ICD-10-CM | POA: Diagnosis not present

## 2014-05-11 DIAGNOSIS — M25562 Pain in left knee: Secondary | ICD-10-CM | POA: Diagnosis not present

## 2014-05-11 DIAGNOSIS — M25572 Pain in left ankle and joints of left foot: Secondary | ICD-10-CM | POA: Diagnosis not present

## 2014-05-11 DIAGNOSIS — M79641 Pain in right hand: Secondary | ICD-10-CM | POA: Diagnosis not present

## 2014-05-11 DIAGNOSIS — M79642 Pain in left hand: Secondary | ICD-10-CM | POA: Diagnosis not present

## 2014-05-11 NOTE — Telephone Encounter (Signed)
Patient scheduled for PFT on 3/16 and 3/22 for OV in [place].  Patient aware that appointment is in [place] and not [employer].  [place] appt has been cancelled.  Nothing further needed.

## 2014-05-12 ENCOUNTER — Other Ambulatory Visit: Payer: Self-pay | Admitting: Pulmonary Disease

## 2014-05-12 DIAGNOSIS — R0600 Dyspnea, unspecified: Secondary | ICD-10-CM

## 2014-05-13 ENCOUNTER — Ambulatory Visit (INDEPENDENT_AMBULATORY_CARE_PROVIDER_SITE_OTHER): Payer: Medicare Other | Admitting: Pulmonary Disease

## 2014-05-13 DIAGNOSIS — R0600 Dyspnea, unspecified: Secondary | ICD-10-CM | POA: Diagnosis not present

## 2014-05-13 LAB — PULMONARY FUNCTION TEST
DL/VA % PRED: 82 %
DL/VA: 3.66 ml/min/mmHg/L
DLCO UNC % PRED: 55 %
DLCO UNC: 16.38 ml/min/mmHg
FEF 25-75 Post: 2.05 L/sec
FEF 25-75 Pre: 1.45 L/sec
FEF2575-%Change-Post: 41 %
FEF2575-%PRED-PRE: 76 %
FEF2575-%Pred-Post: 108 %
FEV1-%Change-Post: 7 %
FEV1-%PRED-POST: 85 %
FEV1-%Pred-Pre: 79 %
FEV1-PRE: 2.15 L
FEV1-Post: 2.32 L
FEV1FVC-%Change-Post: 4 %
FEV1FVC-%Pred-Pre: 101 %
FEV6-%Change-Post: 2 %
FEV6-%PRED-PRE: 83 %
FEV6-%Pred-Post: 85 %
FEV6-POST: 3 L
FEV6-Pre: 2.94 L
FEV6FVC-%PRED-POST: 107 %
FEV6FVC-%Pred-Pre: 107 %
FVC-%Change-Post: 3 %
FVC-%PRED-PRE: 77 %
FVC-%Pred-Post: 80 %
FVC-PRE: 2.94 L
FVC-Post: 3.04 L
POST FEV1/FVC RATIO: 76 %
POST FEV6/FVC RATIO: 100 %
Pre FEV1/FVC ratio: 73 %
Pre FEV6/FVC Ratio: 100 %
RV % pred: 53 %
RV: 1.33 L
TLC % pred: 68 %
TLC: 4.56 L

## 2014-05-13 NOTE — Progress Notes (Signed)
PFT done today. 

## 2014-05-19 ENCOUNTER — Encounter: Payer: Self-pay | Admitting: Pulmonary Disease

## 2014-05-19 ENCOUNTER — Ambulatory Visit (INDEPENDENT_AMBULATORY_CARE_PROVIDER_SITE_OTHER)
Admission: RE | Admit: 2014-05-19 | Discharge: 2014-05-19 | Disposition: A | Discharge status: Home / Self Care | Payer: Medicare Other | Source: Ambulatory Visit | Attending: Pulmonary Disease | Admitting: Pulmonary Disease

## 2014-05-19 ENCOUNTER — Ambulatory Visit (INDEPENDENT_AMBULATORY_CARE_PROVIDER_SITE_OTHER): Payer: Medicare Other | Admitting: Pulmonary Disease

## 2014-05-19 VITALS — BP 132/82 | HR 65 | Ht 69.5 in | Wt 222.0 lb

## 2014-05-19 DIAGNOSIS — R0602 Shortness of breath: Secondary | ICD-10-CM | POA: Diagnosis not present

## 2014-05-19 DIAGNOSIS — Z9989 Dependence on other enabling machines and devices: Secondary | ICD-10-CM

## 2014-05-19 DIAGNOSIS — R0609 Other forms of dyspnea: Secondary | ICD-10-CM

## 2014-05-19 DIAGNOSIS — G4733 Obstructive sleep apnea (adult) (pediatric): Secondary | ICD-10-CM | POA: Diagnosis not present

## 2014-05-19 DIAGNOSIS — I2699 Other pulmonary embolism without acute cor pulmonale: Secondary | ICD-10-CM

## 2014-05-19 NOTE — Progress Notes (Signed)
   Subjective:    Patient ID: Tyler Norris, male    DOB: January 07, 1935, 80 y.o.   MRN: [id number]  HPI  80 year old never smoker with SLE diagnosed 2014 for FU of OSA .  Was on INH for Quantiferon test -positive ,  finished course from 12/2012 . Is followed by Rhuematology for SLE, on Plaquenil . Off prednisone.  H/o pulmonary embolism 05/2012 - finished coumadin course  02/2013 .   05/19/2014  Chief Complaint  Patient presents with  . Follow-up    FAA certification; OSA (pt did not bring card with him, will obtain download). PFT results.   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  No dyspnea  PFTs  - 04/2014 - unchanged, FEV1 79%, FVC 78% improves to >80% with BDs, TLC 68%, DLCO 55%  CPAP download 3/22-shows excellent usage, no residual events, mild leak which is not significant  Wears CPAP every night  -avg 7 hr No naps, no daytime sleepiness.  Doing well with mask . No issues with leaks.  Uses nasal pillows.  Feels well rested .   Significant tests/ events   CT chest on 05/13/12 showed consolidation in left lower lobe and lingula with a small left parapneumonic effusion but no evidence of pulmonary embolism.  CT abdomen showed small nonobstructive bilateral renal calculi and sigmoid colon diverticulosis and a left adrenal nodule consistent with adenoma.  ANA was noted to be strongly positive at 1 in 1280 , RA factor was mildly positive at 18. Double-stranded DNA was positive, and levels were normal as was CCP antibody. ESR was 60 and anti-Smith antibody was negative  Thoracentesis 05/23/12 of 230 mL of monocytic exudative fluid.  CT angiogram on 06/05/12 showed small subsegmental pulmonary emboli in the right upper lobe, right middle lobe and left lower lobe. A right effusion was noted somewhat loculated and a small left effusion and was layering.  Echo - normal systolic function with an EF of  55% and no evidence of diastolic dysfunction.  -venous duplex neg   Past Medical History  Diagnosis Date  . Kidney stones   . Hyperlipidemia   . GERD (gastroesophageal reflux disease)   . Carpal tunnel syndrome, bilateral   . Sleep apnea     uses cpap every night  . AAA (abdominal aortic aneurysm)     resolved by ct scan 09-2011  . Arthritis   . Anemia   . Barrett's esophagus   . Hypertension   . Pneumonia       Review of Systems neg for any significant sore throat, dysphagia, itching, sneezing, nasal congestion or excess/ purulent secretions, fever, chills, sweats, unintended wt loss, pleuritic or exertional cp, hempoptysis, orthopnea pnd or change in chronic leg swelling. Also denies presyncope, palpitations, heartburn, abdominal pain, nausea, vomiting, diarrhea or change in bowel or urinary habits, dysuria,hematuria, rash, arthralgias, visual complaints, headache, numbness weakness or ataxia.     Objective:   Physical Exam  Gen. Pleasant, well-nourished, in no distress ENT - no lesions, no post nasal drip Neck: No JVD, no thyromegaly, no carotid bruits Lungs: no use of accessory muscles, no dullness to percussion, clear without rales or rhonchi  Cardiovascular: Rhythm regular, heart sounds  normal, no murmurs or gallops, no peripheral edema Musculoskeletal: No deformities, no cyanosis or clubbing        Assessment & Plan:

## 2014-05-19 NOTE — Patient Instructions (Addendum)
We will send note for FAA requirements

## 2014-05-20 ENCOUNTER — Telehealth: Payer: Self-pay | Admitting: *Deleted

## 2014-05-20 DIAGNOSIS — M329 Systemic lupus erythematosus, unspecified: Secondary | ICD-10-CM | POA: Diagnosis not present

## 2014-05-20 DIAGNOSIS — Z79899 Other long term (current) drug therapy: Secondary | ICD-10-CM | POA: Diagnosis not present

## 2014-05-20 NOTE — Assessment & Plan Note (Signed)
He has completed anticoagulation course No desaturation exertion. Lung function is decreased but stable, more importantly he is asymptomatic

## 2014-05-20 NOTE — Telephone Encounter (Signed)
Attempted to call [facility] to obtain download, the respiratory dept would not answer phone for 2 days.  Operator sent them an email request and they did not respond.  I called Melissa from [facility] to get help in obtaining report.  Melissa said she would have patient bring in machine today and get download.  Patient has not brought in machine.  She said she will send me download as soon as patient brings in machine.  Awaiting download  FYI to Dr. Elsworth Soho.

## 2014-05-21 NOTE — Assessment & Plan Note (Signed)
He is very compliant with CPAP. He does not have any residual events on the download or symptoms of daytime somnolence. He is cleared from a sleep apnea standpoint

## 2014-05-21 NOTE — Telephone Encounter (Signed)
Received download.  PFT, download and OV notes have been copied and left at front desk for patient to pick up.  Patient notified. Nothing further needed.

## 2014-06-02 DIAGNOSIS — E559 Vitamin D deficiency, unspecified: Secondary | ICD-10-CM | POA: Diagnosis not present

## 2014-06-11 ENCOUNTER — Ambulatory Visit: Payer: Medicare Other | Admitting: Pulmonary Disease

## 2014-06-24 ENCOUNTER — Ambulatory Visit: Payer: Medicare Other | Admitting: Pulmonary Disease

## 2014-07-22 IMAGING — CR DG CHEST 2V
3 series · 3 of 3 positions shown · non-contrast
Comparison: 05/13/2012 CT

CLINICAL DATA: Shortness of breath, chest pain

CHEST - 2 VIEW

[w chest lat (1 of 2)]
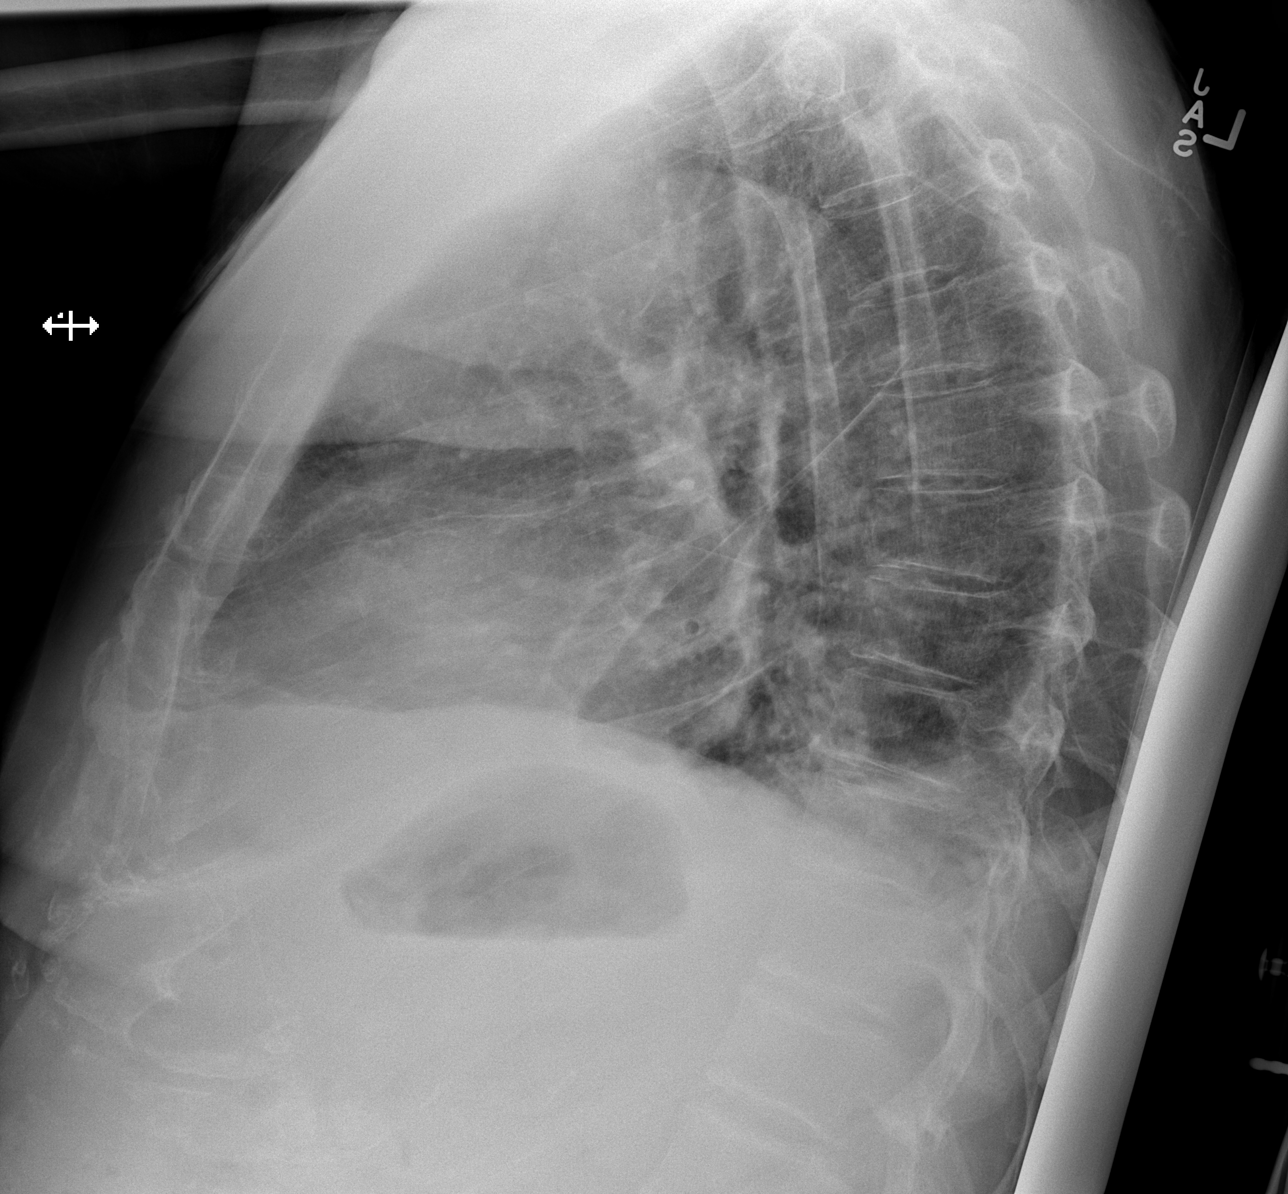

[w chest lat (2 of 2)]
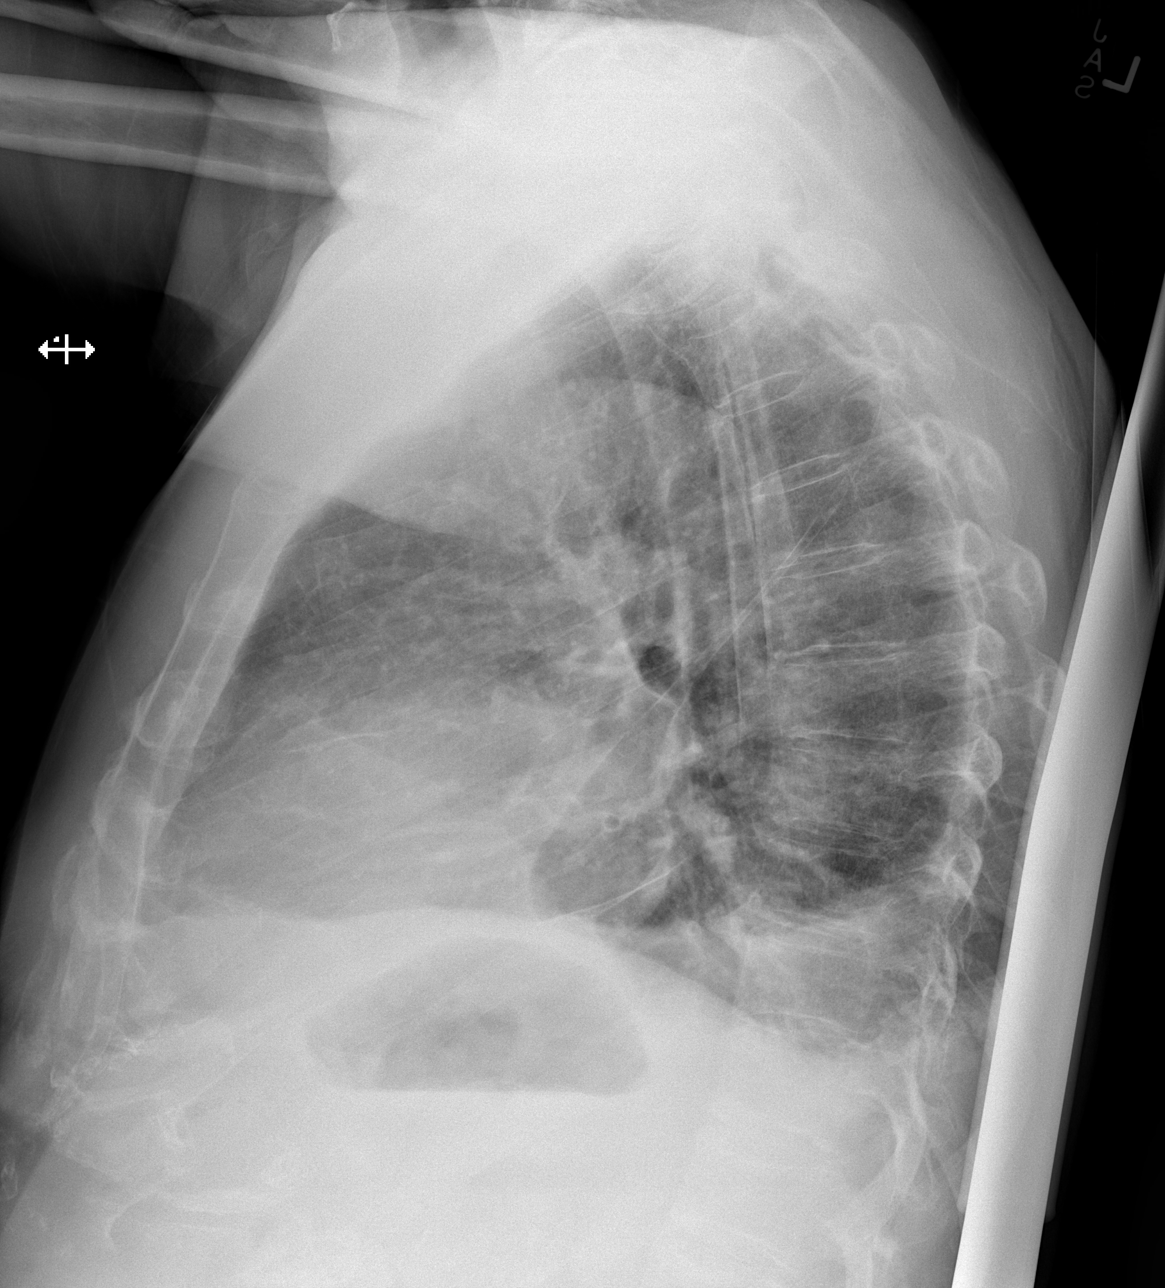

[x chest ap]
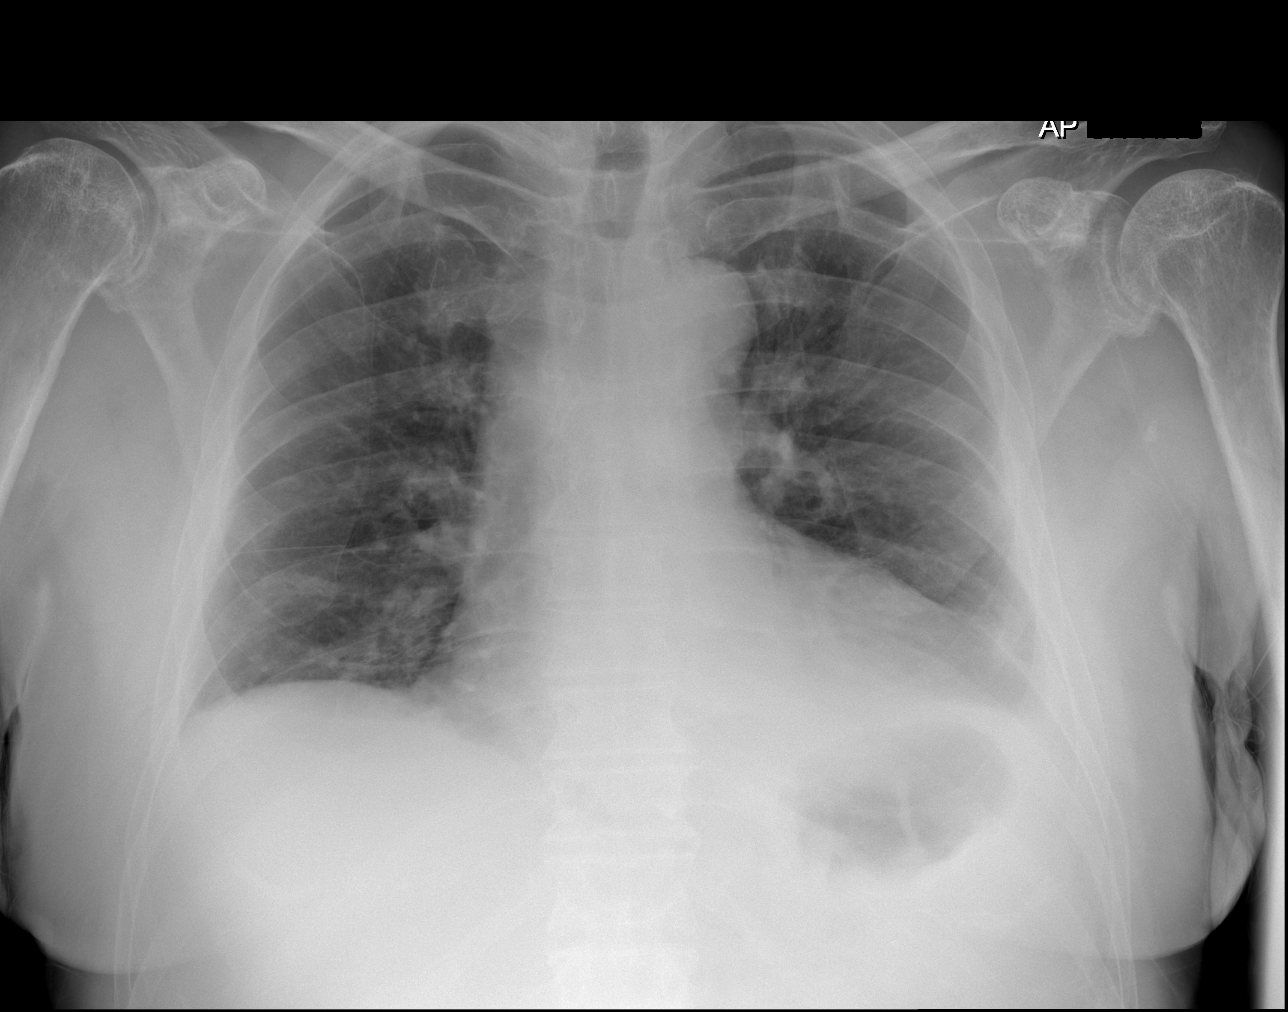

[3 of 3 positions shown; findings below may reference images not displayed]

FINDINGS: Normal caliber aorta with tortuosity.  Heart size upper
normal to mildly enlarged.  Left lung base opacity and small
effusion.  No pneumothorax.  No acute osseous finding.
IMPRESSION: Left lung base consolidation and associated effusion; atelectasis
versus pneumonia, similar to recent prior.

Heart size upper normal to mildly enlarged.

## 2014-07-27 IMAGING — CR DG CHEST 1V
1 series · 1 of 1 positions shown · non-contrast
Comparison: CT chest 05/22/2012. PA and lateral chest 05/18/2012.

CLINICAL DATA: Status post left thoracentesis.

CHEST - 1 VIEW

[x chest ap]
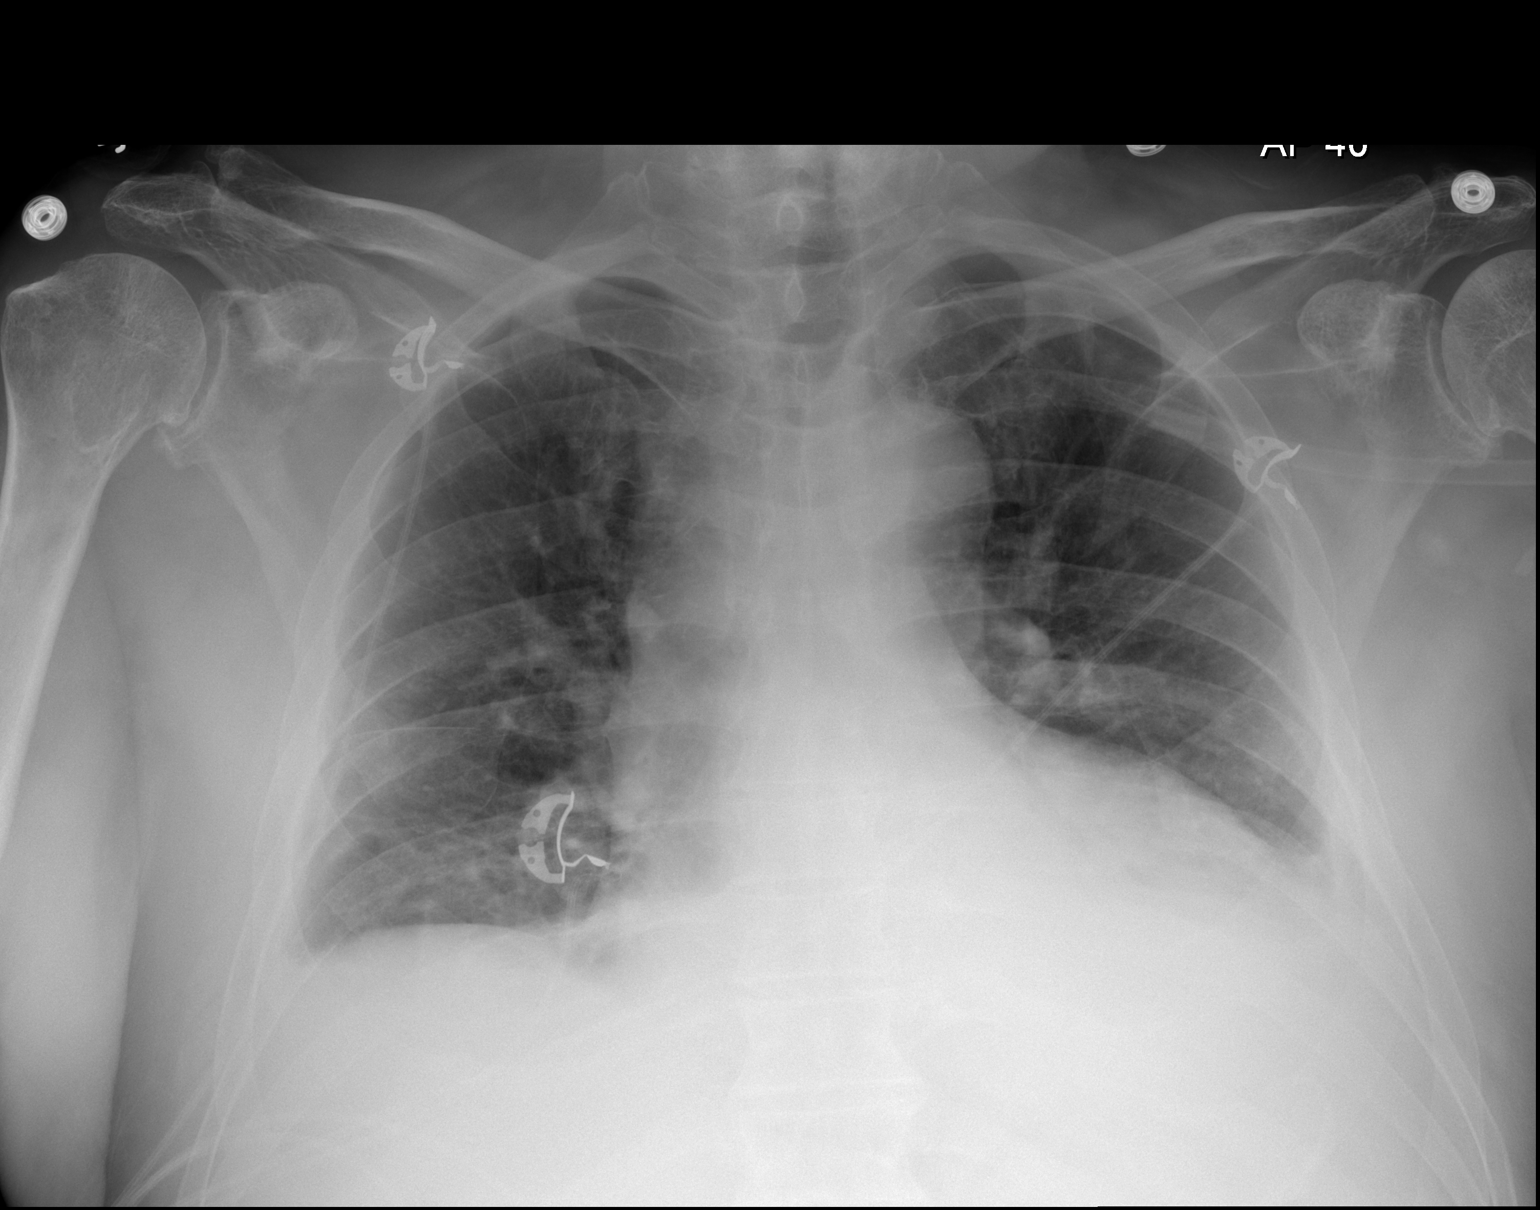

[1 of 1 positions shown; findings below may reference images not displayed]

FINDINGS: No pneumothorax is identified.  There are small bilateral
pleural effusions and basilar airspace disease, worse on the left.
Heart size is upper normal.
IMPRESSION: 1.  Negative for pneumothorax after thoracentesis.
2.  Small bilateral pleural effusions and basilar airspace disease.

## 2014-08-07 DIAGNOSIS — Z79899 Other long term (current) drug therapy: Secondary | ICD-10-CM | POA: Diagnosis not present

## 2014-08-07 DIAGNOSIS — R300 Dysuria: Secondary | ICD-10-CM | POA: Diagnosis not present

## 2014-08-07 DIAGNOSIS — E559 Vitamin D deficiency, unspecified: Secondary | ICD-10-CM | POA: Diagnosis not present

## 2014-08-07 DIAGNOSIS — M2550 Pain in unspecified joint: Secondary | ICD-10-CM | POA: Diagnosis not present

## 2014-08-09 IMAGING — CR DG CHEST 2V
2 series · 2 of 2 positions shown · non-contrast
Comparison: 05/23/2012

CLINICAL DATA: Chest pain and shortness of breath.

CHEST - 2 VIEW

[w chest pa]
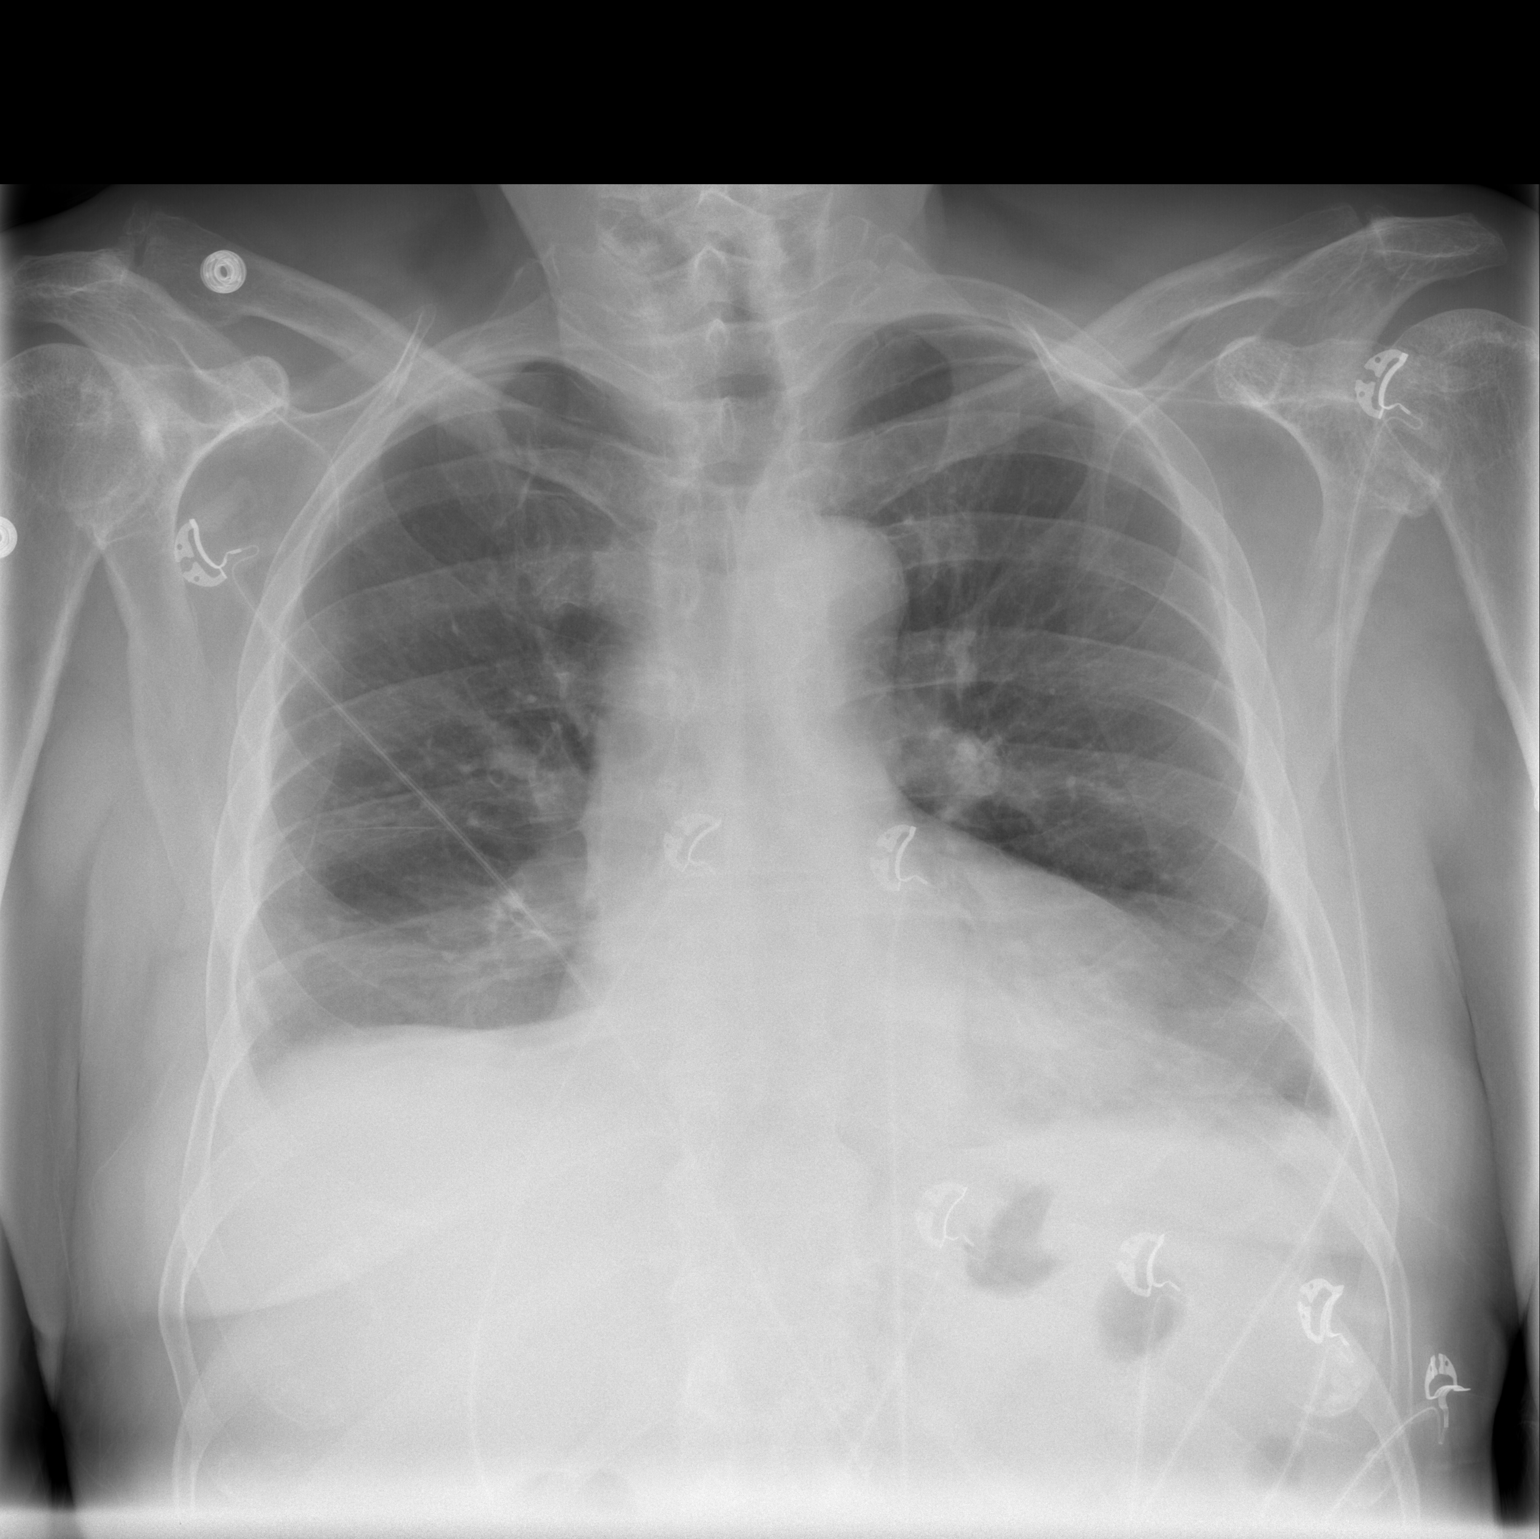

[w chest lat]
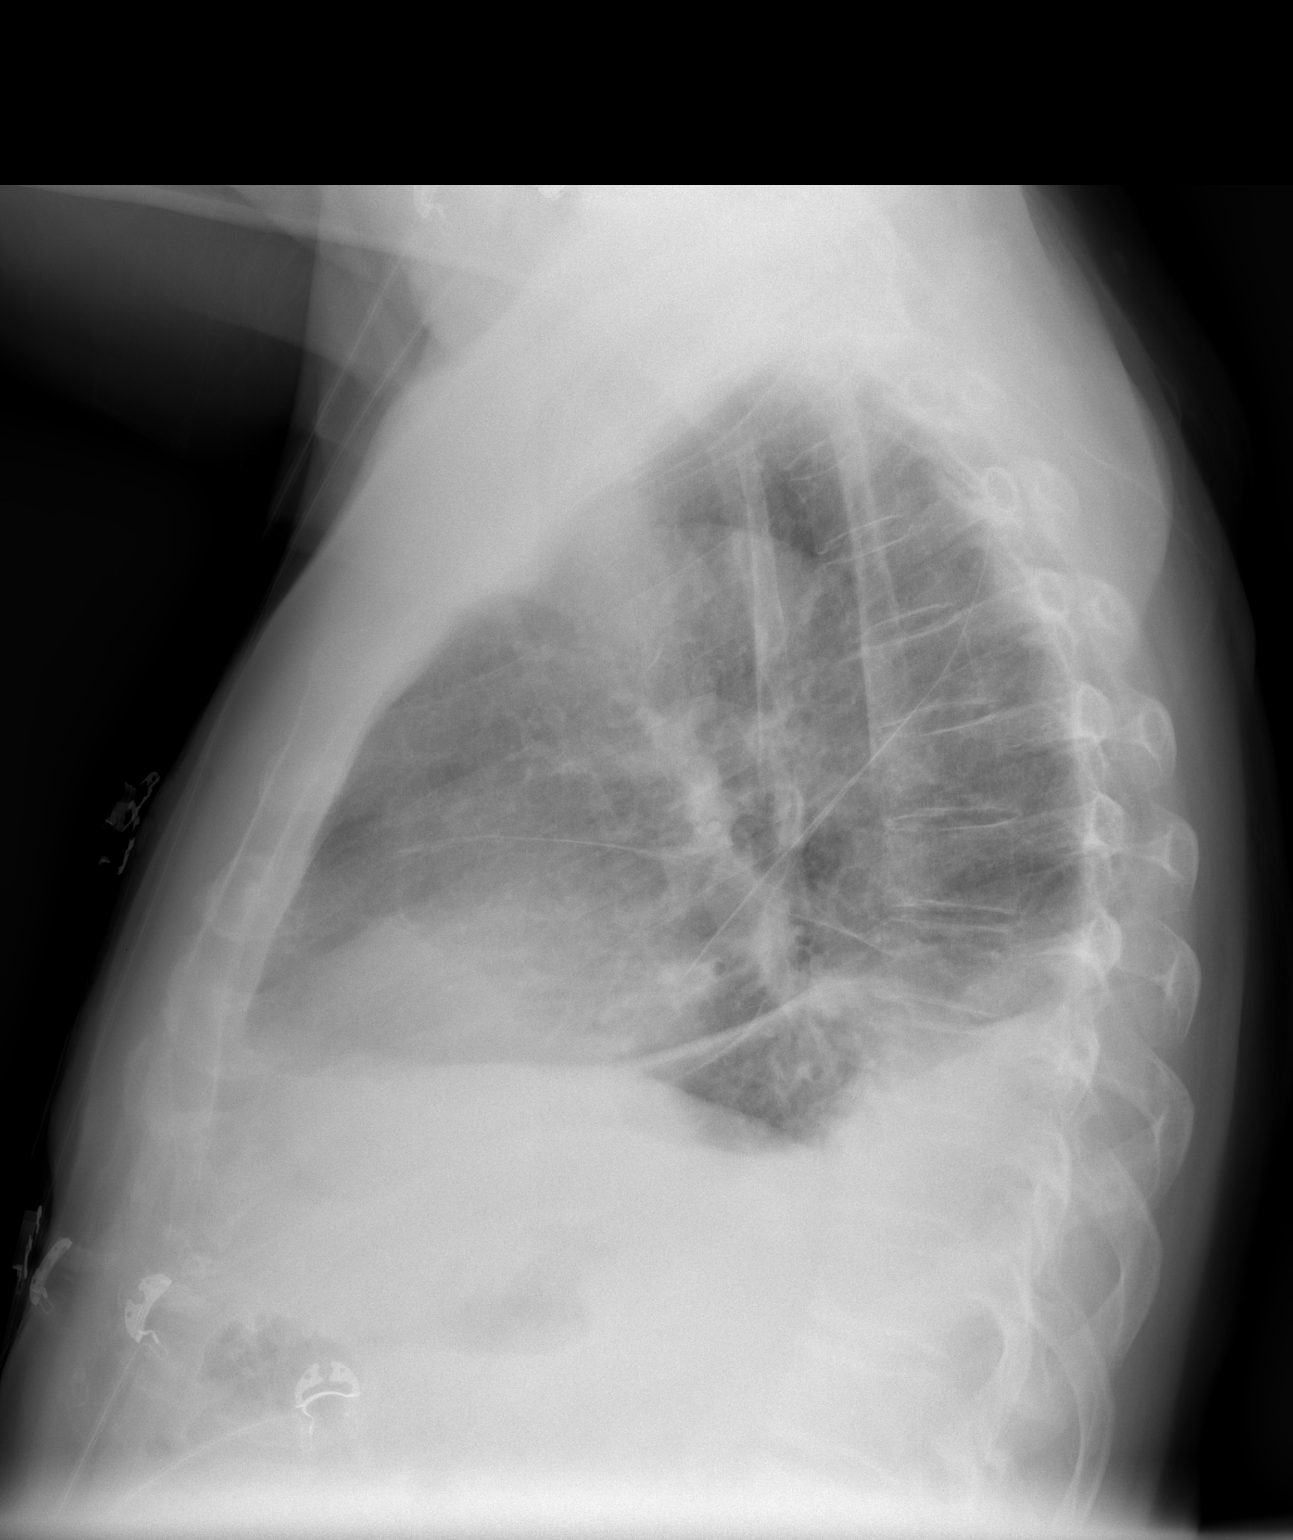

[2 of 2 positions shown; findings below may reference images not displayed]

FINDINGS: Heart size is normal.  There are small bilateral pleural
effusions identified.  Atelectasis is noted within both lung bases.
No focal airspace consolidation.
IMPRESSION: Bilateral pleural effusions and bibasilar atelectasis.

## 2014-08-18 ENCOUNTER — Encounter (HOSPITAL_COMMUNITY): Payer: Self-pay

## 2014-08-18 ENCOUNTER — Emergency Department (HOSPITAL_COMMUNITY)
Admission: EM | Admit: 2014-08-18 | Discharge: 2014-08-18 | Disposition: A | Discharge status: Home / Self Care | Payer: Medicare Other | Attending: Emergency Medicine | Admitting: Emergency Medicine

## 2014-08-18 ENCOUNTER — Emergency Department (HOSPITAL_COMMUNITY): Payer: Medicare Other

## 2014-08-18 ENCOUNTER — Telehealth: Payer: Self-pay | Admitting: Physician Assistant

## 2014-08-18 ENCOUNTER — Emergency Department (HOSPITAL_BASED_OUTPATIENT_CLINIC_OR_DEPARTMENT_OTHER): Payer: Medicare Other

## 2014-08-18 DIAGNOSIS — R0602 Shortness of breath: Secondary | ICD-10-CM | POA: Insufficient documentation

## 2014-08-18 DIAGNOSIS — R069 Unspecified abnormalities of breathing: Secondary | ICD-10-CM | POA: Diagnosis not present

## 2014-08-18 DIAGNOSIS — E785 Hyperlipidemia, unspecified: Secondary | ICD-10-CM | POA: Insufficient documentation

## 2014-08-18 DIAGNOSIS — I10 Essential (primary) hypertension: Secondary | ICD-10-CM | POA: Insufficient documentation

## 2014-08-18 DIAGNOSIS — Z7982 Long term (current) use of aspirin: Secondary | ICD-10-CM | POA: Diagnosis not present

## 2014-08-18 DIAGNOSIS — Z8701 Personal history of pneumonia (recurrent): Secondary | ICD-10-CM | POA: Insufficient documentation

## 2014-08-18 DIAGNOSIS — K2270 Barrett's esophagus without dysplasia: Secondary | ICD-10-CM | POA: Diagnosis not present

## 2014-08-18 DIAGNOSIS — Z87442 Personal history of urinary calculi: Secondary | ICD-10-CM | POA: Diagnosis not present

## 2014-08-18 DIAGNOSIS — I313 Pericardial effusion (noninflammatory): Secondary | ICD-10-CM | POA: Diagnosis not present

## 2014-08-18 DIAGNOSIS — M1990 Unspecified osteoarthritis, unspecified site: Secondary | ICD-10-CM | POA: Insufficient documentation

## 2014-08-18 DIAGNOSIS — Z9981 Dependence on supplemental oxygen: Secondary | ICD-10-CM | POA: Insufficient documentation

## 2014-08-18 DIAGNOSIS — D649 Anemia, unspecified: Secondary | ICD-10-CM | POA: Diagnosis not present

## 2014-08-18 DIAGNOSIS — R079 Chest pain, unspecified: Secondary | ICD-10-CM | POA: Diagnosis not present

## 2014-08-18 DIAGNOSIS — I3139 Other pericardial effusion (noninflammatory): Secondary | ICD-10-CM

## 2014-08-18 DIAGNOSIS — R61 Generalized hyperhidrosis: Secondary | ICD-10-CM | POA: Diagnosis not present

## 2014-08-18 DIAGNOSIS — J90 Pleural effusion, not elsewhere classified: Secondary | ICD-10-CM | POA: Diagnosis not present

## 2014-08-18 DIAGNOSIS — Z79899 Other long term (current) drug therapy: Secondary | ICD-10-CM | POA: Diagnosis not present

## 2014-08-18 DIAGNOSIS — R0789 Other chest pain: Secondary | ICD-10-CM | POA: Diagnosis not present

## 2014-08-18 DIAGNOSIS — K219 Gastro-esophageal reflux disease without esophagitis: Secondary | ICD-10-CM | POA: Diagnosis not present

## 2014-08-18 DIAGNOSIS — G4730 Sleep apnea, unspecified: Secondary | ICD-10-CM | POA: Diagnosis not present

## 2014-08-18 DIAGNOSIS — J9811 Atelectasis: Secondary | ICD-10-CM | POA: Diagnosis not present

## 2014-08-18 LAB — BASIC METABOLIC PANEL
ANION GAP: 11 (ref 5–15)
BUN: 17 mg/dL (ref 6–20)
CHLORIDE: 107 mmol/L (ref 101–111)
CO2: 19 mmol/L — ABNORMAL LOW (ref 22–32)
Calcium: 9.2 mg/dL (ref 8.9–10.3)
Creatinine, Ser: 0.87 mg/dL (ref 0.61–1.24)
GFR calc Af Amer: >60 mL/min (ref >60)
Glucose, Bld: 189 mg/dL — ABNORMAL HIGH (ref 65–99)
POTASSIUM: 4.4 mmol/L (ref 3.5–5.1)
Sodium: 137 mmol/L (ref 135–145)

## 2014-08-18 LAB — I-STAT TROPONIN, ED
TROPONIN I, POC: 0 ng/mL (ref 0.00–0.08)
TROPONIN I, POC: 0 ng/mL (ref 0.00–0.08)

## 2014-08-18 LAB — CBC
HCT: 35.9 % — ABNORMAL LOW (ref 39.0–52.0)
Hemoglobin: 12.6 g/dL — ABNORMAL LOW (ref 13.0–17.0)
MCH: 31 pg (ref 26.0–34.0)
MCHC: 35.1 g/dL (ref 30.0–36.0)
MCV: 88.2 fL (ref 78.0–100.0)
Platelets: 276 10*3/uL (ref 150–400)
RBC: 4.07 MIL/uL — AB (ref 4.22–5.81)
RDW: 12.6 % (ref 11.5–15.5)
WBC: 9.9 10*3/uL (ref 4.0–10.5)

## 2014-08-18 LAB — BRAIN NATRIURETIC PEPTIDE: B Natriuretic Peptide: 69.4 pg/mL (ref 0.0–100.0)

## 2014-08-18 MED ORDER — CEFTRIAXONE SODIUM 1 G IJ SOLR
1.0000 g | Freq: Once | INTRAMUSCULAR | Status: AC
  Administered 2014-08-18: 1 g via INTRAVENOUS
  Filled 2014-08-18: qty 10

## 2014-08-18 MED ORDER — PERFLUTREN LIPID MICROSPHERE
1.0000 mL | INTRAVENOUS | Status: AC | PRN
  Administered 2014-08-18: 2 mL via INTRAVENOUS
  Filled 2014-08-18: qty 10

## 2014-08-18 MED ORDER — PERFLUTREN LIPID MICROSPHERE
INTRAVENOUS | Status: AC
  Filled 2014-08-18: qty 10

## 2014-08-18 MED ORDER — DEXTROSE 5 % IV SOLN
500.0000 mg | Freq: Once | INTRAVENOUS | Status: AC
  Administered 2014-08-18: 500 mg via INTRAVENOUS
  Filled 2014-08-18: qty 500

## 2014-08-18 MED ORDER — IOHEXOL 350 MG/ML SOLN
100.0000 mL | Freq: Once | INTRAVENOUS | Status: AC | PRN
  Administered 2014-08-18: 100 mL via INTRAVENOUS

## 2014-08-18 NOTE — Consult Note (Signed)
[address]       [place] [address]             [phone]        Tyler Norris [facility] Medical Record #[id number] Date of Birth: 10/10/1934  Referring: No ref. provider found Primary Care: Cammy Copa, MD  Chief Complaint:    Chief Complaint  Patient presents with  . Shortness of Breath  . Chest Pain    History of Present Illness:      Tyler Norris is a 80 yo obese white male with history of SLE, Fibromyalgia, GERD, AAA, HTN, Hyperlipidemia Barrett's Esophagitis, OSA, and PE.  He presented to the ED via EMS with complaints of chest tightness and dyspnea.  He states several days ago he noticed with deep inspiration that he was experiencing chest tightness.  However over the next several days this progressed to the point the patient states he felt like he was smothering if he was laying flat.  He states he used his CPAP which did not provide relief.  Workup in the ED consists of CTA of the chest which was negative for PE and Aortic Dissection.  However, there is evidence of a Pericardial Effusion.  Currently the patient continues to have mild chest tightness with deep inspiration.  He is maintaining good oxygenation levels and is not requiring use of oxygen currently.  He denies history of smoking.  He states he has completed treatment for his PE back in 2015 and he is routinely followed by Dr. Elsworth Soho.    Current Activity/ Functional Status: Patient is independent with mobility/ambulation, transfers, ADL's, IADL's.   Zubrod Score: At the time of surgery this patient's most appropriate activity status/level should be described as: []     0    Normal activity, no symptoms [x]     1    Restricted in physical strenuous activity but ambulatory, able to do out light work []     2    Ambulatory and capable of self care, unable to do work activities, up and about                 more than 50%  Of the time                            []     3    Only limited  self care, in bed greater than 50% of waking hours []     4    Completely disabled, no self care, confined to bed or chair []     5    Moribund  Past Medical History  Diagnosis Date  . Kidney stones   . Hyperlipidemia   . GERD (gastroesophageal reflux disease)   . Carpal tunnel syndrome, bilateral   . Sleep apnea     uses cpap every night  . AAA (abdominal aortic aneurysm)     resolved by ct scan 09-2011  . Arthritis   . Anemia   . Barrett's esophagus   . Hypertension   . Pneumonia     Past Surgical History  Procedure Laterality Date  . Appendectomy    . Hernia repair  1966  . Hand surgery      scar repaired lt hand  . Tonsillectomy    . Cystoscopy w/ stone manipulation  2001,2009  . Carpal tunnel release  10/12/2011    Procedure: CARPAL TUNNEL RELEASE;  Surgeon: Youlanda Mighty Sypher  Brooke Bonito., MD;  Location: [facility];  Service: Orthopedics;  Laterality: Left;  . Carpal tunnel release  12/01/2011    Procedure: CARPAL TUNNEL RELEASE;  Surgeon: Cammie Sickle., MD;  Location: [facility];  Service: Orthopedics;  Laterality: Right;  . Knee arthroscopy Left     History  Smoking status  . Never Smoker   Smokeless tobacco  . Never Used   History  Alcohol Use No    History   Social History  . Marital Status: Married    Spouse Name: N/A  . Number of Children: N/A  . Years of Education: N/A   Occupational History  . Not on file.   Social History Main Topics  . Smoking status: Never Smoker   . Smokeless tobacco: Never Used  . Alcohol Use: No  . Drug Use: No  . Sexual Activity: Not on file   Other Topics Concern  . Not on file   Social History Narrative    Allergies  Allergen Reactions  . Lipitor [Atorvastatin Calcium]     Myalgias    No current facility-administered medications for this encounter.   Current Outpatient Prescriptions  Medication Sig Dispense Refill  . aspirin 81 MG tablet Take 81 mg by mouth daily.    Marland Kitchen  Bioflavonoid Products (BIOFLEX PO) Take 2 tablets by mouth daily.     . calcium-vitamin D (OSCAL WITH D) 500-200 MG-UNIT per tablet Take 1 tablet by mouth daily with breakfast.     . esomeprazole (NEXIUM) 40 MG capsule Take 40 mg by mouth daily at 12 noon.    . fenofibrate (TRICOR) 145 MG tablet Take 145 mg by mouth daily.    . ferrous sulfate 325 (65 FE) MG tablet Take 325 mg by mouth daily with breakfast.    . folic acid (FOLVITE) 921 MCG tablet Take 400 mcg by mouth daily.    . hydroxychloroquine (PLAQUENIL) 200 MG tablet Take 200 mg by mouth 2 (two) times daily.    . Multiple Vitamin (MULTIVITAMIN WITH MINERALS) TABS Take 1 tablet by mouth daily.    . naproxen sodium (ANAPROX) 220 MG tablet Take 220 mg by mouth 2 (two) times a week. Per bottle as needed    . Omega-3 Fatty Acids (FISH OIL) 1200 MG CAPS Take by mouth daily.    . rosuvastatin (CRESTOR) 5 MG tablet Take 2.5 mg by mouth daily.     . simvastatin (ZOCOR) 20 MG tablet Take 20 mg by mouth every evening.  2  . vitamin C (ASCORBIC ACID) 500 MG tablet Take 1,000 mg by mouth daily.     . Vitamin D, Ergocalciferol, (DRISDOL) 50000 UNITS CAPS capsule Take 50,000 Units by mouth every 7 (seven) days.    . vitamin E 400 UNIT capsule Take 400 Units by mouth daily.       (Not in a hospital admission)  Family History  Problem Relation Age of Onset  . Heart disease Father   . Hyperlipidemia Father   . Hypertension Father   . Other Father     VARICOSE VEIN  . Peripheral vascular disease Father   . Diabetes Sister    Review of Systems:      Cardiac Review of Systems: Y or N  Chest Pain [  y  ]  Resting SOB [   ] Exertional SOB  [ y ]  Vertell Limber Blue.Reese  ]   Pedal Edema [ y  ]    Palpitations [  ]  Syncope  [  ]   Presyncope [   ]  General Review of Systems: [Y] = yes [  ]=no Constitional: recent weight change [  ]; anorexia [  ]; fatigue [  ]; nausea [  ]; night sweats [  ]; fever [  ]; or chills [  ]                                                                Dental: poor dentition[  ]; Last Dentist visit:   Eye : blurred vision [  ]; diplopia [   ]; vision changes [  ];  Amaurosis fugax[  ]; Resp: cough [  ];  wheezing[  ];  hemoptysis[  ]; shortness of breath[ y ]; paroxysmal nocturnal dyspnea[y  ]; dyspnea on exertion[  ]; or orthopnea[  ];  GI:  gallstones[  ], vomiting[  ];  dysphagia[  ]; melena[  ];  hematochezia [  ]; heartburn[  ];   Hx of  Colonoscopy[  ]; GU: kidney stones Blue.Reese  ]; hematuria[  ];   dysuria [  ];  nocturia[  ];  history of     obstruction [  ]; urinary frequency [  ]             Skin: rash, swelling[  ];, hair loss[  ];  peripheral edema[y  ];  or itching[  ]; Musculosketetal: myalgias[  ];  joint swelling[  ];  joint erythema[  ];  joint pain[  ];  back pain[  ];  Heme/Lymph: bruising[  ];  bleeding[  ];  anemia[  ];  Neuro: TIA[  ];  headaches[  ];  stroke[  ];  vertigo[  ];  seizures[  ];   paresthesias[  ];  difficulty walking[  ];  Psych:depression[  ]; anxiety[  ];  Endocrine: diabetes[ n ];  thyroid dysfunction[  ];  Immunizations: Flu Cinna.Spore  ]; Pneumococcal[ Yes};  Other:  Physical Exam: BP 147/75 mmHg  Pulse 83  Temp(Src) 97.5 F (36.4 C) (Oral)  Resp 15  SpO2 94%  General appearance: alert, cooperative and no distress Head: Normocephalic, without obvious abnormality, atraumatic Neck: no adenopathy, no carotid bruit, no JVD, supple, symmetrical, trachea midline and thyroid not enlarged, symmetric, no tenderness/mass/nodules Resp: clear to auscultation bilaterally Cardio: regular rate and rhythm GI: soft, non-tender; bowel sounds normal; no masses,  no organomegaly Extremities: edema 2+ pitting Neurologic: Grossly normal  Diagnostic Studies & Laboratory data:     Recent Radiology Findings:   Ct Angio Chest Pe W/cm &/or Wo Cm  08/18/2014   CLINICAL DATA:  80 year old male with shortness of breath and chest pain for 3 days. Initial encounter.  EXAM: CT ANGIOGRAPHY CHEST WITH  CONTRAST  TECHNIQUE: Multidetector CT imaging of the chest was performed using the standard protocol during bolus administration of intravenous contrast. Multiplanar CT image reconstructions and MIPs were obtained to evaluate the vascular anatomy.  CONTRAST:  166mL OMNIPAQUE IOHEXOL 350 MG/ML SOLN  COMPARISON:  Chest CTA 06/05/2012  FINDINGS: Good contrast bolus timing in the pulmonary arterial tree.  No focal filling defect identified in the pulmonary arterial tree to suggest the presence of acute pulmonary embolism.  New pericardial effusion up to 2 cm in thickness, and  greater along the anterior surface than the posterior surface of the heart. See series 4, image 60. No mediastinal lymphadenopathy. Chronic cardiomegaly. Chronic Coronary artery and aortic calcified atherosclerosis. Little contrast in the aorta on today's exam.  Bilateral small to moderate layering pleural effusions, mildly smaller than in 2014.  Atelectatic changes to the major airways. Compressive lower lobe and lingula atelectasis. Small volume of pleural fluid in the right fissures. No consolidation.  No acute osseous abnormality identified.  Negative visualized liver, spleen, pancreas, adrenal glands, and bowel in the upper abdomen.  Review of the MIP images confirms the above findings.  IMPRESSION: 1. Negative for acute pulmonary embolism, but positive for new moderate size pericardial effusion. See series 4, image 60. 2. Chronic cardiomegaly. Small bilateral layering pleural effusions with compressive atelectasis.   Electronically Signed   By: Genevie Ann M.D.   On: 08/18/2014 13:53   Dg Chest Port 1 View  08/18/2014   CLINICAL DATA:  One day history of chest pain and shortness of breath  EXAM: PORTABLE CHEST - 1 VIEW  COMPARISON:  May 19, 2014  FINDINGS: There is hazy opacity in the lateral right base, suspicious for focal pneumonia. The lungs elsewhere are clear. Heart is mildly enlarged with pulmonary vascularity within normal limits. No  adenopathy. No bone lesions.  IMPRESSION: Lateral right base infiltrate. Lungs otherwise clear. Stable cardiac enlargement. Followup PA and lateral chest radiographs recommended in 3-4 weeks following trial of antibiotic therapy to ensure resolution and exclude underlying malignancy.   Electronically Signed   By: Lowella Grip III M.D.   On: 08/18/2014 11:16     I have independently reviewed the above radiologic studies.  Recent Lab Findings: Lab Results  Component Value Date   WBC 9.9 08/18/2014   HGB 12.6* 08/18/2014   HCT 35.9* 08/18/2014   PLT 276 08/18/2014   GLUCOSE 189* 08/18/2014   ALT 33 06/05/2012   AST 45* 06/05/2012   NA 137 08/18/2014   K 4.4 08/18/2014   CL 107 08/18/2014   CREATININE 0.87 08/18/2014   BUN 17 08/18/2014   CO2 19* 08/18/2014   INR 2.29* 06/10/2012   ECHO:today Study Conclusions  - Left ventricle: The cavity size was normal. Systolic function was normal. The estimated ejection fraction was in the range of 55% to 60%. Wall motion was normal; there were no regional wall motion abnormalities. Doppler parameters are consistent with abnormal left ventricular relaxation (grade 1 diastolic dysfunction). - Mitral valve: Calcified annulus. - Pericardium, extracardiac: A small to moderate pericardial effusion was identified circumferential to the heart (anterior greater than inferior). There is no evidence of right ventricular and right atrial collapse. No respiratory Doppler inflow variation. IVC is however dilated consistent with elevated central venous pressure. Heart rate is 82bpm. Although tamponade is less likely based on current echo findings (except for dilated IVC), this remains a bedside diagnosis.  Assessment / Plan:      1. Suspected Pericardial Effusion- CTA of chest done and shows fluid collection, and echo in patient with history of Lupus. No need for acute pericardial drainage however patient will need  Cardiology consult/evaluation.  HTN- SBP in the 170s in ED, does not appear to be on home medication, may benefit from starting of Antihypertensive 3. Hyperlipidemia- on crestor, tricor 4. SLE- takes Plaquenil 5. Dispo- Surgery  aware of patient,  Cardiology can call us if intervention is needed.    I  spent 30 minutes counseling the patient face to face and 50%  or more the  time was spent in counseling and coordination of care. The total time spent in the appointment was 45 minutes. Grace Isaac MD      [address] [place] [address] Office [phone]   [place]

## 2014-08-18 NOTE — ED Notes (Signed)
Pt ambulated independently to restroom with steady gait. O2 sat 96% while ambulating.

## 2014-08-18 NOTE — ED Provider Notes (Signed)
Patient care signed out to me by Noland Fordyce, PA-C. Plan is for follow-up with CT surgery versus cardiology after echo for further evaluation and management of pericardial effusion. Echo shows no tamponade and no need for emergent drainage at this time. Discussed results and patient presentation with cardiology, Dr. Harl Bowie. At this time is reasonable to repeat EKG and also troponins, and if no new objective findings patient stable to follow-up for outpatient management and repeat echo. Cardiology will set up an outpatient echo for patient. Patient vital signs have remained stable in the ED. He denies any chest discomfort or difficulty breathing now. Delta Troponin remains 0.00. EKG unchanged. Filed Vitals:   08/18/14 1900 08/18/14 1913 08/18/14 1915 08/18/14 1926  BP: 152/89 110/63    Pulse: 85 81 80   Temp:      TempSrc:      Resp:  21 26   SpO2: 93% 95% 94% 96%     Tyler Locket, PA-C 08/18/14 2113  Carmin Muskrat, MD 08/19/14 0002

## 2014-08-18 NOTE — ED Provider Notes (Signed)
CSN: [id number]     Arrival date & time 08/18/14  1030 History   First MD Initiated Contact with Patient 08/18/14 1111     Chief Complaint  Patient presents with  . Shortness of Breath  . Chest Pain     (Consider location/radiation/quality/duration/timing/severity/associated sxs/prior Treatment) HPI Pt is a 80yo male with hx of GERD, AAA, HTN, Barrett's esophagus, and pneumonia complications in the past, presenting to ED with c/o 3 day hx of gradually worsening centralized chest tightness and discomfort with associated SOB and orthopnea.  Pt uses a CPAP machine at night but not on O2 at home otherwise. Pt states when he laid down last night he felt like he was suffocating, even with his CPAP machine.  Reports hx of similar difficulty breathing when he had pneumonia.  Pt also reports hx of diaphragm spasms and PE several years ago.  PCP is concerned pt is fluid overloaded despite no prior hx of CHF.  Pt denies leg swelling. Denies fever, chills, n/v/d. Per EMS, pt was placed on [place] O2 given O2 sats were 90% on RA after tx of albuterol, atrovent, solumedrol and aspirin.   Past Medical History  Diagnosis Date  . Kidney stones   . Hyperlipidemia   . GERD (gastroesophageal reflux disease)   . Carpal tunnel syndrome, bilateral   . Sleep apnea     uses cpap every night  . AAA (abdominal aortic aneurysm)     resolved by ct scan 09-2011  . Arthritis   . Anemia   . Barrett's esophagus   . Hypertension   . Pneumonia    Past Surgical History  Procedure Laterality Date  . Appendectomy    . Hernia repair  1966  . Hand surgery      scar repaired lt hand  . Tonsillectomy    . Cystoscopy w/ stone manipulation  2001,2009  . Carpal tunnel release  10/12/2011    Procedure: CARPAL TUNNEL RELEASE;  Surgeon: Cammie Sickle., MD;  Location: [place];  Service: Orthopedics;  Laterality: Left;  . Carpal tunnel release  12/01/2011    Procedure: CARPAL TUNNEL RELEASE;  Surgeon: Cammie Sickle., MD;  Location: [place];  Service: Orthopedics;  Laterality: Right;  . Knee arthroscopy Left    Family History  Problem Relation Age of Onset  . Heart disease Father   . Hyperlipidemia Father   . Hypertension Father   . Other Father     VARICOSE VEIN  . Peripheral vascular disease Father   . Diabetes Sister    History  Substance Use Topics  . Smoking status: Never Smoker   . Smokeless tobacco: Never Used  . Alcohol Use: No    Review of Systems  Constitutional: Positive for fatigue. Negative for fever, chills and appetite change.  HENT: Positive for congestion.   Respiratory: Positive for cough and shortness of breath.   Cardiovascular: Negative for chest pain, palpitations and leg swelling.  Gastrointestinal: Positive for abdominal distention. Negative for nausea, vomiting, abdominal pain, diarrhea and constipation.  All other systems reviewed and are negative.     Allergies  Lipitor  Home Medications   Prior to Admission medications   Medication Sig Start Date End Date Taking? Authorizing Provider  aspirin 81 MG tablet Take 81 mg by mouth daily.   Yes Historical Provider, MD  Bioflavonoid Products (BIOFLEX PO) Take 2 tablets by mouth daily.    Yes Historical Provider, MD  calcium-vitamin D Darron Doom  WITH D) 500-200 MG-UNIT per tablet Take 1 tablet by mouth daily with breakfast.    Yes Historical Provider, MD  esomeprazole (NEXIUM) 40 MG capsule Take 40 mg by mouth daily at 12 noon.   Yes Historical Provider, MD  fenofibrate (TRICOR) 145 MG tablet Take 145 mg by mouth daily.   Yes Historical Provider, MD  ferrous sulfate 325 (65 FE) MG tablet Take 325 mg by mouth daily with breakfast.   Yes Historical Provider, MD  folic acid (FOLVITE) 161 MCG tablet Take 400 mcg by mouth daily.   Yes Historical Provider, MD  hydroxychloroquine (PLAQUENIL) 200 MG tablet Take 200 mg by mouth 2 (two) times daily.   Yes Historical Provider, MD  Multiple Vitamin  (MULTIVITAMIN WITH MINERALS) TABS Take 1 tablet by mouth daily.   Yes Historical Provider, MD  naproxen sodium (ANAPROX) 220 MG tablet Take 220 mg by mouth 2 (two) times a week. Per bottle as needed   Yes Historical Provider, MD  Omega-3 Fatty Acids (FISH OIL) 1200 MG CAPS Take by mouth daily.   Yes Historical Provider, MD  rosuvastatin (CRESTOR) 5 MG tablet Take 2.5 mg by mouth daily.    Yes Historical Provider, MD  simvastatin (ZOCOR) 20 MG tablet Take 20 mg by mouth every evening. 08/10/14  Yes Historical Provider, MD  vitamin C (ASCORBIC ACID) 500 MG tablet Take 1,000 mg by mouth daily.    Yes Historical Provider, MD  Vitamin D, Ergocalciferol, (DRISDOL) 50000 UNITS CAPS capsule Take 50,000 Units by mouth every 7 (seven) days.   Yes Historical Provider, MD  vitamin E 400 UNIT capsule Take 400 Units by mouth daily.   Yes Historical Provider, MD   BP 174/79 mmHg  Pulse 81  Temp(Src) 97.5 F (36.4 C) (Oral)  Resp 16  SpO2 95% Physical Exam  Constitutional: He appears well-developed and well-nourished.  HENT:  Head: Normocephalic and atraumatic.  Eyes: Conjunctivae are normal. No scleral icterus.  Neck: Normal range of motion. Neck supple.  Cardiovascular: Normal rate, regular rhythm and normal heart sounds.   Pulmonary/Chest: Effort normal. No respiratory distress. He has decreased breath sounds in the right lower field and the left lower field. He has no wheezes. He has rhonchi in the right lower field and the left lower field. He has rales. He exhibits no tenderness.  Abdominal: Soft. Bowel sounds are normal. He exhibits no distension and no mass. There is no tenderness. There is no rebound and no guarding.  Musculoskeletal: Normal range of motion.  Neurological: He is alert.  Skin: Skin is warm. He is diaphoretic.  Nursing note and vitals reviewed.   ED Course  Procedures (including critical care time) Labs Review Labs Reviewed  CBC - Abnormal; Notable for the following:    RBC  4.07 (*)    Hemoglobin 12.6 (*)    HCT 35.9 (*)    All other components within normal limits  BASIC METABOLIC PANEL - Abnormal; Notable for the following:    CO2 19 (*)    Glucose, Bld 189 (*)    All other components within normal limits  BRAIN NATRIURETIC PEPTIDE  I-STAT TROPOININ, ED    Imaging Review Ct Angio Chest Pe W/cm &/or Wo Cm  08/18/2014   CLINICAL DATA:  80 year old male with shortness of breath and chest pain for 3 days. Initial encounter.  EXAM: CT ANGIOGRAPHY CHEST WITH CONTRAST  TECHNIQUE: Multidetector CT imaging of the chest was performed using the standard protocol during bolus administration of intravenous contrast.  Multiplanar CT image reconstructions and MIPs were obtained to evaluate the vascular anatomy.  CONTRAST:  116mL OMNIPAQUE IOHEXOL 350 MG/ML SOLN  COMPARISON:  Chest CTA 06/05/2012  FINDINGS: Good contrast bolus timing in the pulmonary arterial tree.  No focal filling defect identified in the pulmonary arterial tree to suggest the presence of acute pulmonary embolism.  New pericardial effusion up to 2 cm in thickness, and greater along the anterior surface than the posterior surface of the heart. See series 4, image 60. No mediastinal lymphadenopathy. Chronic cardiomegaly. Chronic Coronary artery and aortic calcified atherosclerosis. Little contrast in the aorta on today's exam.  Bilateral small to moderate layering pleural effusions, mildly smaller than in 2014.  Atelectatic changes to the major airways. Compressive lower lobe and lingula atelectasis. Small volume of pleural fluid in the right fissures. No consolidation.  No acute osseous abnormality identified.  Negative visualized liver, spleen, pancreas, adrenal glands, and bowel in the upper abdomen.  Review of the MIP images confirms the above findings.  IMPRESSION: 1. Negative for acute pulmonary embolism, but positive for new moderate size pericardial effusion. See series 4, image 60. 2. Chronic cardiomegaly.  Small bilateral layering pleural effusions with compressive atelectasis.   Electronically Signed   By: Genevie Ann M.D.   On: 08/18/2014 13:53   Dg Chest Port 1 View  08/18/2014   CLINICAL DATA:  One day history of chest pain and shortness of breath  EXAM: PORTABLE CHEST - 1 VIEW  COMPARISON:  May 19, 2014  FINDINGS: There is hazy opacity in the lateral right base, suspicious for focal pneumonia. The lungs elsewhere are clear. Heart is mildly enlarged with pulmonary vascularity within normal limits. No adenopathy. No bone lesions.  IMPRESSION: Lateral right base infiltrate. Lungs otherwise clear. Stable cardiac enlargement. Followup PA and lateral chest radiographs recommended in 3-4 weeks following trial of antibiotic therapy to ensure resolution and exclude underlying malignancy.   Electronically Signed   By: Lowella Grip III M.D.   On: 08/18/2014 11:16     EKG Interpretation   Date/Time:  Tuesday August 18 2014 10:41:36 EDT Ventricular Rate:  91 PR Interval:  152 QRS Duration: 88 QT Interval:  364 QTC Calculation: 448 R Axis:   -25 Text Interpretation:  Sinus rhythm Borderline left axis deviation Abnormal  R-wave progression, late transition Minimal ST elevation, inferior leads  No significant change since last tracing Confirmed by [place]  ([id number]) on 08/18/2014 10:54:06 AM      MDM   Final diagnoses:  Pericardial effusion  Shortness of breath    Pt is a 80yo male c/o worsening SOB and chest discomfort for 3 days. Worse with lying down even with CPAP machine. Pt not on O2 at home besides CPAP at night, however, he is requiring 3L O2 in ED.  Lungs: decreased lung sounds in lower lung fields bilaterally with rales and rhonchi.  Pt sounds fluid overloaded. No significant pedal edema. Abdomen is soft, non-tender.   CXR: concerning for pneumonia due to lateral Right base infiltrate.  Will start on antibiotics for CAP, IV azithromycin and rocephin ordered.    Discussed pt  with Dr. Tawnya Crook who also examined pt. Recommends CT chest to r/o PE as pt has hx of same. CT chest: concerning for NEW moderate size pericardial effusion. Negative for acute pulmonary embolism. Discussed imaging with Dr. Nevada Crane, states no evidence of pneumonia, pt appears fluid overloaded which could account for pt's SOB.    Will consult with CT  surgery for further recommendation.      2:51 PM Consulted with Dr. Servando Snare, CT surgery, recommended consulting with cardiology first who should perform an echo, then will consult CT surgery as needed. Pt is NPO. Last drink was orange juice around 8AM. No food today. Between consults, pt ambulated to bathroom, pt remained 96% O2 Sat at RA.  Pt resting comfortably.    3:52 PM Plan is for cardiology to evaluate pt and perform an echo.  CT surgery, Dr. Servando Snare is aware of pt and will f/u with echo.  At this time, no evidence of cardiac tamponade based on clinical appearance and vital signs. Filed Vitals:   08/18/14 1515  BP: 174/79  Pulse: 81  Temp: 97.5 F at 10:43AM    Resp: 16  O2 Sat 95% on RA 15:15  Pt signed out to [employer], PA-C at shift change.  Pt will likely need admission to cardiology for further evaluation of cause of pericardial effusion as well as management of effusion, elective vs emergent based on echo results.     Noland Fordyce, PA-C 08/18/14 1555  Ernestina Patches, MD 08/19/14 1115

## 2014-08-18 NOTE — ED Notes (Signed)
Patient transported to CT 

## 2014-08-18 NOTE — ED Notes (Signed)
Pt stable, ambulatory, states understanding of discharge instructions 

## 2014-08-18 NOTE — Progress Notes (Signed)
Discussed patient with ER provider. Admitted with pleuritic chest pain, CT negative for PE or dissection but did show pericardial effusion. Echo done which shows mild to moderate effusion, no findings supportive of tamponade. No clinical findings of tamponade (ie. Tachycardia, hypotension). EKG without specific ischemic changes, troponin negative x 1. Pleuritic pain/effusions potentially related to his SLE. Recommend repeat troponin (initial was 8 hrs ago) and EKG, if ok will plan for outpatient cards f/u in 2 weeks with repeat echo. Consider course of NSAIDs for pleuritic chest pain, ibufrpofen 800mg  tid for 7 days. Patient should also follow up with his rheumatologist.   Zandra Abts MD

## 2014-08-18 NOTE — Telephone Encounter (Signed)
I was asked by Dr. Harl Bowie (covering DOD this evening) to request a new patient appointment in 2 weeks and echo in 2 weeks for pericardial effusion. Please arrange. Thanks!

## 2014-08-18 NOTE — Discharge Instructions (Signed)
Cardiology will get in touch with you and schedule an outpatient echo for further evaluation of your pericardial effusion. Your echo today was not concerning and showed that she did not need an emergent drainage of the fluid in your heart. Please follow-up with PCP. Return to ED for new or worsening symptoms  Pericardial Effusion Pericardial effusion is an accumulation of extra fluid in the pericardial space. This is the space between the heart and the sac that surrounds it. This space normally contains a small amount of fluid which serves as lubrication for the heart inside the sac. If the amount of fluid increases, it causes problems for the working of the heart. This is called a heart (cardiac) tamponade.  CAUSES  A higher incidence of pericardial effusion is associated with certain diseases. Some common causes of pericardial effusion are:  Infections (bacterial, viral, fungal, from AIDS, etc.).  Cancer which has spread to the pericardial sac.  Fluid accumulation in the sac after a heart attack or open heart surgery.  Injury (a fall with chest injury or as a result of a knife or gunshot wound) with bleeding into the pericardial sac.  Immune diseases (such as Rheumatoid Arthritis) and other arthritis conditions.  Reactions (uncommon) to medications. SYMPTOMS  Very small effusions may cause no problems. Even a small effusion if it comes on rapidly can be deadly. Some common symptoms are:  Chest pain, pressure, discomfort.  Light-headed feeling, fainting.  Cough, shortness of breath.  Feeling of palpitations.  Hiccoughs  Anxiety or confusion DIAGNOSIS  Your caregiver may do a number of blood tests and imaging tests (like X-rays) to help find the cause.   ECHO (Echocardiographic stress test) has become the diagnostic method of choice. This is due to its portability and availability. CT and MRI are also used, and may be more accurate.  Pericardioscopy, where available, uses a  telescope-like instrument to look inside the pericardial sac.  This may be helpful in cases of unexplained pericardial effusions. It allows your caregiver to look at the pericardium and do pericardial biopsies if something abnormal is found.  Sometimes fluid may be removed to help with diagnosing the cause of the accumulation. This is called a pericardiocentesis. TREATMENT  Treatment is directed at removal of the extra pericardial fluid and treating the cause. Small amounts of effusion that are not causing problems can simply be watched. If the fluid is accumulating rapidly and resulting in cardiac tamponade, this can be life-threatening. Emergency removal of fluid must be done.  SEEK IMMEDIATE MEDICAL CARE IF:   You develop chest pain, irregular heart beat (palpitations) or racing heart, shortness of breath, or begin sweating.  You become lightheaded or pass out.  You develop increasing swelling of the legs. Document Released: 10/11/2004 Document Revised: 05/08/2011 Document Reviewed: 09/27/2007 [facility] Patient Information 2015 [place], [place]. This information is not intended to replace advice given to you by your health care provider. Make sure you discuss any questions you have with your health care provider.

## 2014-08-18 NOTE — ED Notes (Signed)
Per EMS - pt coming for dr office, c/o shortness of breath and chest discomfort x 3 days. Hx of diaphragm spasms but denies any CHF hx. Pt dyspneic w/ exertion, abd distention, orthopnea even with CPAP x several days. Rales in lower lungs, initial wheezes in upper that cleared with albuterol (5), atrovent (.5), solumedrol (125), aspirin (324). 20G left wrist. No CP at this time. 90% RA - 97% on tx. EKG unremarkable.

## 2014-08-18 NOTE — Progress Notes (Signed)
Echocardiogram 2D Echocardiogram with Definity has been performed.  Tyler Norris 08/18/2014, 4:12 PM

## 2014-08-18 NOTE — ED Notes (Signed)
repaged cards/[place] to [place], [place]

## 2014-08-20 ENCOUNTER — Telehealth: Payer: Self-pay

## 2014-08-20 NOTE — Telephone Encounter (Signed)
Spoke to pt about scheduling New Pt appt per a staff message from [facility] PA. Pt states that he would not like to schedule a new pt appt w/ our office and that he will be seeing a cardiologist at another office.

## 2014-08-21 DIAGNOSIS — I313 Pericardial effusion (noninflammatory): Secondary | ICD-10-CM | POA: Diagnosis not present

## 2014-08-21 DIAGNOSIS — R0600 Dyspnea, unspecified: Secondary | ICD-10-CM | POA: Diagnosis not present

## 2014-08-21 DIAGNOSIS — R609 Edema, unspecified: Secondary | ICD-10-CM | POA: Diagnosis not present

## 2014-08-21 DIAGNOSIS — E119 Type 2 diabetes mellitus without complications: Secondary | ICD-10-CM | POA: Diagnosis not present

## 2014-08-21 DIAGNOSIS — J189 Pneumonia, unspecified organism: Secondary | ICD-10-CM | POA: Diagnosis not present

## 2014-08-22 ENCOUNTER — Other Ambulatory Visit (HOSPITAL_COMMUNITY): Payer: Medicare Other

## 2014-08-22 ENCOUNTER — Encounter (HOSPITAL_COMMUNITY): Payer: Self-pay

## 2014-08-22 ENCOUNTER — Inpatient Hospital Stay (HOSPITAL_COMMUNITY): Payer: Medicare Other

## 2014-08-22 ENCOUNTER — Inpatient Hospital Stay (HOSPITAL_COMMUNITY)
Admission: EM | Admit: 2014-08-22 | Discharge: 2014-08-24 | DRG: 315 | Disposition: A | Discharge status: Home / Self Care | Payer: Medicare Other | Attending: Cardiology | Admitting: Cardiology

## 2014-08-22 ENCOUNTER — Emergency Department (HOSPITAL_COMMUNITY): Payer: Medicare Other

## 2014-08-22 DIAGNOSIS — M329 Systemic lupus erythematosus, unspecified: Secondary | ICD-10-CM | POA: Insufficient documentation

## 2014-08-22 DIAGNOSIS — R0602 Shortness of breath: Secondary | ICD-10-CM | POA: Diagnosis present

## 2014-08-22 DIAGNOSIS — I3139 Other pericardial effusion (noninflammatory): Secondary | ICD-10-CM | POA: Diagnosis present

## 2014-08-22 DIAGNOSIS — K2270 Barrett's esophagus without dysplasia: Secondary | ICD-10-CM | POA: Diagnosis present

## 2014-08-22 DIAGNOSIS — M1990 Unspecified osteoarthritis, unspecified site: Secondary | ICD-10-CM | POA: Diagnosis present

## 2014-08-22 DIAGNOSIS — E785 Hyperlipidemia, unspecified: Secondary | ICD-10-CM | POA: Diagnosis present

## 2014-08-22 DIAGNOSIS — K219 Gastro-esophageal reflux disease without esophagitis: Secondary | ICD-10-CM | POA: Diagnosis present

## 2014-08-22 DIAGNOSIS — E1159 Type 2 diabetes mellitus with other circulatory complications: Secondary | ICD-10-CM | POA: Diagnosis present

## 2014-08-22 DIAGNOSIS — Z888 Allergy status to other drugs, medicaments and biological substances status: Secondary | ICD-10-CM

## 2014-08-22 DIAGNOSIS — Z8249 Family history of ischemic heart disease and other diseases of the circulatory system: Secondary | ICD-10-CM

## 2014-08-22 DIAGNOSIS — I313 Pericardial effusion (noninflammatory): Secondary | ICD-10-CM

## 2014-08-22 DIAGNOSIS — I319 Disease of pericardium, unspecified: Secondary | ICD-10-CM

## 2014-08-22 DIAGNOSIS — G4733 Obstructive sleep apnea (adult) (pediatric): Secondary | ICD-10-CM | POA: Diagnosis present

## 2014-08-22 DIAGNOSIS — D62 Acute posthemorrhagic anemia: Secondary | ICD-10-CM | POA: Diagnosis not present

## 2014-08-22 DIAGNOSIS — D649 Anemia, unspecified: Secondary | ICD-10-CM | POA: Diagnosis present

## 2014-08-22 DIAGNOSIS — Z9114 Patient's other noncompliance with medication regimen: Secondary | ICD-10-CM | POA: Diagnosis present

## 2014-08-22 DIAGNOSIS — I10 Essential (primary) hypertension: Secondary | ICD-10-CM | POA: Diagnosis present

## 2014-08-22 DIAGNOSIS — R069 Unspecified abnormalities of breathing: Secondary | ICD-10-CM | POA: Diagnosis not present

## 2014-08-22 DIAGNOSIS — I152 Hypertension secondary to endocrine disorders: Secondary | ICD-10-CM | POA: Diagnosis present

## 2014-08-22 DIAGNOSIS — Z9989 Dependence on other enabling machines and devices: Secondary | ICD-10-CM

## 2014-08-22 HISTORY — DX: Systemic lupus erythematosus, unspecified: M32.9

## 2014-08-22 LAB — CBC WITH DIFFERENTIAL/PLATELET
BASOS ABS: 0 10*3/uL (ref 0.0–0.1)
BASOS PCT: 0 % (ref 0–1)
BASOS PCT: 0 % (ref 0–1)
Basophils Absolute: 0 10*3/uL (ref 0.0–0.1)
EOS ABS: 0 10*3/uL (ref 0.0–0.7)
EOS ABS: 0 10*3/uL (ref 0.0–0.7)
Eosinophils Relative: 0 % (ref 0–5)
Eosinophils Relative: 0 % (ref 0–5)
HCT: 37.9 % — ABNORMAL LOW (ref 39.0–52.0)
HCT: 36.8 % — ABNORMAL LOW (ref 39.0–52.0)
Hemoglobin: 12.7 g/dL — ABNORMAL LOW (ref 13.0–17.0)
Hemoglobin: 12.5 g/dL — ABNORMAL LOW (ref 13.0–17.0)
LYMPHS ABS: 0.9 10*3/uL (ref 0.7–4.0)
LYMPHS ABS: 1 10*3/uL (ref 0.7–4.0)
Lymphocytes Relative: 9 % — ABNORMAL LOW (ref 12–46)
Lymphocytes Relative: 9 % — ABNORMAL LOW (ref 12–46)
MCH: 29.6 pg (ref 26.0–34.0)
MCH: 30 pg (ref 26.0–34.0)
MCHC: 33.5 g/dL (ref 30.0–36.0)
MCHC: 34 g/dL (ref 30.0–36.0)
MCV: 88.3 fL (ref 78.0–100.0)
MCV: 88.5 fL (ref 78.0–100.0)
MONOS PCT: 10 % (ref 3–12)
Monocytes Absolute: 1.1 10*3/uL — ABNORMAL HIGH (ref 0.1–1.0)
Monocytes Absolute: 1 10*3/uL (ref 0.1–1.0)
Monocytes Relative: 9 % (ref 3–12)
NEUTROS ABS: 8.7 10*3/uL — AB (ref 1.7–7.7)
Neutro Abs: 9 10*3/uL — ABNORMAL HIGH (ref 1.7–7.7)
Neutrophils Relative %: 81 % — ABNORMAL HIGH (ref 43–77)
Neutrophils Relative %: 82 % — ABNORMAL HIGH (ref 43–77)
PLATELETS: 346 10*3/uL (ref 150–400)
Platelets: 317 10*3/uL (ref 150–400)
RBC: 4.29 MIL/uL (ref 4.22–5.81)
RBC: 4.16 MIL/uL — ABNORMAL LOW (ref 4.22–5.81)
RDW: 12.4 % (ref 11.5–15.5)
RDW: 12.4 % (ref 11.5–15.5)
WBC: 10.8 10*3/uL — AB (ref 4.0–10.5)
WBC: 11 10*3/uL — ABNORMAL HIGH (ref 4.0–10.5)

## 2014-08-22 LAB — COMPREHENSIVE METABOLIC PANEL
ALT: 20 U/L (ref 17–63)
AST: 18 U/L (ref 15–41)
Albumin: 3.3 g/dL — ABNORMAL LOW (ref 3.5–5.0)
Alkaline Phosphatase: 43 U/L (ref 38–126)
Anion gap: 9 (ref 5–15)
BUN: 19 mg/dL (ref 6–20)
CALCIUM: 9.1 mg/dL (ref 8.9–10.3)
CO2: 23 mmol/L (ref 22–32)
Chloride: 105 mmol/L (ref 101–111)
Creatinine, Ser: 0.89 mg/dL (ref 0.61–1.24)
GFR calc Af Amer: >60 mL/min (ref >60)
GFR calc non Af Amer: >60 mL/min (ref >60)
Glucose, Bld: 162 mg/dL — ABNORMAL HIGH (ref 65–99)
POTASSIUM: 4.2 mmol/L (ref 3.5–5.1)
Sodium: 137 mmol/L (ref 135–145)
TOTAL PROTEIN: 6 g/dL — AB (ref 6.5–8.1)
Total Bilirubin: 0.8 mg/dL (ref 0.3–1.2)

## 2014-08-22 LAB — APTT: aPTT: 31 seconds (ref 24–37)

## 2014-08-22 LAB — I-STAT CHEM 8, ED
BUN: 21 mg/dL — ABNORMAL HIGH (ref 6–20)
CHLORIDE: 105 mmol/L (ref 101–111)
CREATININE: 0.8 mg/dL (ref 0.61–1.24)
Calcium, Ion: 1.25 mmol/L (ref 1.13–1.30)
Glucose, Bld: 154 mg/dL — ABNORMAL HIGH (ref 65–99)
HCT: 37 % — ABNORMAL LOW (ref 39.0–52.0)
Hemoglobin: 12.6 g/dL — ABNORMAL LOW (ref 13.0–17.0)
Potassium: 4.3 mmol/L (ref 3.5–5.1)
SODIUM: 138 mmol/L (ref 135–145)
TCO2: 20 mmol/L (ref 0–100)

## 2014-08-22 LAB — TSH
TSH: 0.628 u[IU]/mL (ref 0.350–4.500)
TSH: 0.586 u[IU]/mL (ref 0.350–4.500)

## 2014-08-22 LAB — PHOSPHORUS: PHOSPHORUS: 3.9 mg/dL (ref 2.5–4.6)

## 2014-08-22 LAB — I-STAT TROPONIN, ED: Troponin i, poc: 0 ng/mL (ref 0.00–0.08)

## 2014-08-22 LAB — MAGNESIUM: Magnesium: 1.5 mg/dL — ABNORMAL LOW (ref 1.7–2.4)

## 2014-08-22 LAB — PROTIME-INR
INR: 1.33 (ref 0.00–1.49)
Prothrombin Time: 16.6 seconds — ABNORMAL HIGH (ref 11.6–15.2)

## 2014-08-22 MED ORDER — SODIUM CHLORIDE 0.9 % IV SOLN: 250.0000 mL | INTRAVENOUS | Status: DC | PRN

## 2014-08-22 MED ORDER — ROSUVASTATIN CALCIUM 5 MG PO TABS
2.5000 mg | ORAL_TABLET | Freq: Every day | ORAL | Status: DC
  Administered 2014-08-23 – 2014-08-24 (×2): 2.5 mg via ORAL
  Filled 2014-08-22 (×2): qty 0.5

## 2014-08-22 MED ORDER — HYDROXYCHLOROQUINE SULFATE 200 MG PO TABS
200.0000 mg | ORAL_TABLET | Freq: Two times a day (BID) | ORAL | Status: DC
  Administered 2014-08-22 – 2014-08-24 (×4): 200 mg via ORAL
  Filled 2014-08-22 (×5): qty 1

## 2014-08-22 MED ORDER — FENOFIBRATE 160 MG PO TABS
160.0000 mg | ORAL_TABLET | Freq: Every day | ORAL | Status: DC
  Administered 2014-08-23 – 2014-08-24 (×2): 160 mg via ORAL
  Filled 2014-08-22 (×3): qty 1

## 2014-08-22 MED ORDER — COLCHICINE 0.6 MG PO TABS
0.6000 mg | ORAL_TABLET | Freq: Two times a day (BID) | ORAL | Status: DC
  Administered 2014-08-22 – 2014-08-24 (×4): 0.6 mg via ORAL
  Filled 2014-08-22 (×6): qty 1

## 2014-08-22 MED ORDER — ASPIRIN EC 81 MG PO TBEC
81.0000 mg | DELAYED_RELEASE_TABLET | Freq: Every day | ORAL | Status: DC
  Administered 2014-08-23: 81 mg via ORAL
  Filled 2014-08-22 (×3): qty 1

## 2014-08-22 MED ORDER — IBUPROFEN 600 MG PO TABS
600.0000 mg | ORAL_TABLET | Freq: Three times a day (TID) | ORAL | Status: DC
  Administered 2014-08-22 – 2014-08-24 (×6): 600 mg via ORAL
  Filled 2014-08-22 (×5): qty 1
  Filled 2014-08-22: qty 3
  Filled 2014-08-22 (×3): qty 1

## 2014-08-22 MED ORDER — FERROUS SULFATE 325 (65 FE) MG PO TABS
325.0000 mg | ORAL_TABLET | Freq: Every day | ORAL | Status: DC
  Administered 2014-08-23 – 2014-08-24 (×2): 325 mg via ORAL
  Filled 2014-08-22 (×3): qty 1

## 2014-08-22 MED ORDER — PANTOPRAZOLE SODIUM 40 MG PO TBEC
40.0000 mg | DELAYED_RELEASE_TABLET | Freq: Every day | ORAL | Status: DC
  Administered 2014-08-22 – 2014-08-23 (×2): 40 mg via ORAL
  Filled 2014-08-22: qty 1

## 2014-08-22 MED ORDER — SODIUM CHLORIDE 0.9 % IJ SOLN
3.0000 mL | Freq: Two times a day (BID) | INTRAMUSCULAR | Status: DC
  Administered 2014-08-22 – 2014-08-23 (×3): 3 mL via INTRAVENOUS

## 2014-08-22 MED ORDER — SODIUM CHLORIDE 0.9 % IJ SOLN: 3.0000 mL | INTRAMUSCULAR | Status: DC | PRN

## 2014-08-22 NOTE — H&P (Addendum)
Admit date: 08/22/2014 Referring Physician:  Dr. Alvino Chapel Primary Cardiologist:  Dr. Harl Bowie Chief complaint/reason for admission:: SOB  HPI: This is a 80yo WM with a history of dyslipidemia, GERD with Barretts esophagus, SLE with history of pleural effusion now on plaquenil, OSA and HTN who was recently seen in the ER with complaints of pleuritic CP.  CT chest was negative for PE but did show a pericardial effusion.  Echo showed mild to moderate effusion but no tamponade.  CVTS was consulted and recommended NSAIDs and outpt cardiology followup.  He was told to start Ibuprofen but he did not start it.  He takes Aleve as needed for pain PRN and has not taken that either.  Today he presented to the ER with complaints of SOB.  Last night around 11pm he started to feel like he was not getting enough oxygen and felt very lethargic.  He then started getting nauseated with no energy.  His symptoms worsened and he stayed in bed.  He says that if he inhales he feels that he cannot get enough air in.  He also noticed a heaviness and tightening in his left chest with no radiation.  He came to the ER for further evaluation and says that he is feeling better.     PMH:    Past Medical History  Diagnosis Date  . Kidney stones   . Hyperlipidemia   . GERD (gastroesophageal reflux disease)   . Carpal tunnel syndrome, bilateral   . Sleep apnea     uses cpap every night  . AAA (abdominal aortic aneurysm)     resolved by ct scan 09-2011  . Arthritis   . Anemia   . Barrett's esophagus   . Hypertension   . Pneumonia   . Lupus (systemic lupus erythematosus)     PSH:    Past Surgical History  Procedure Laterality Date  . Appendectomy    . Hernia repair  1966  . Hand surgery      scar repaired lt hand  . Tonsillectomy    . Cystoscopy w/ stone manipulation  2001,2009  . Carpal tunnel release  10/12/2011    Procedure: CARPAL TUNNEL RELEASE;  Surgeon: Cammie Sickle., MD;  Location: [place];  Service: Orthopedics;  Laterality: Left;  . Carpal tunnel release  12/01/2011    Procedure: CARPAL TUNNEL RELEASE;  Surgeon: Cammie Sickle., MD;  Location: [place];  Service: Orthopedics;  Laterality: Right;  . Knee arthroscopy Left     ALLERGIES:   Lipitor  Prior to Admit Meds:   (Not in a hospital admission) Family HX:    Family History  Problem Relation Age of Onset  . Heart disease Father   . Hyperlipidemia Father   . Hypertension Father   . Other Father     VARICOSE VEIN  . Peripheral vascular disease Father   . Diabetes Sister    Social HX:    History   Social History  . Marital Status: Married    Spouse Name: N/A  . Number of Children: N/A  . Years of Education: N/A   Occupational History  . Not on file.   Social History Main Topics  . Smoking status: Never Smoker   . Smokeless tobacco: Never Used  . Alcohol Use: No  . Drug Use: No  . Sexual Activity: Not on file   Other Topics Concern  . Not on file   Social History Narrative  ROS:  All 11 ROS were addressed and are negative except what is stated in the HPI  PHYSICAL EXAM Filed Vitals:   08/22/14 1245  BP: 114/61  Pulse: 82  Temp:   Resp: 31   General: Well developed, well nourished, in no acute distress Head: Eyes PERRLA, No xanthomas.   Normal cephalic and atramatic  Lungs:   Clear bilaterally to auscultation and percussion. Heart:   HRRR S1 S2 Pulses are 2+ & equal.            No carotid bruit. No JVD.  No abdominal bruits. No femoral bruits. Abdomen: Bowel sounds are positive, abdomen soft and non-tender without masses  Extremities:   No clubbing, cyanosis or edema.  DP +1 Neuro: Alert and oriented X 3. Psych:  Good affect, responds appropriately   Labs:   Lab Results  Component Value Date   WBC 10.8* 08/22/2014   HGB 12.6* 08/22/2014   HCT 37.0* 08/22/2014   MCV 88.3 08/22/2014   PLT 346 08/22/2014     Recent Labs Lab 08/18/14 1109  08/22/14 1148  NA 137 138  K 4.4 4.3  CL 107 105  CO2 19*  --   BUN 17 21*  CREATININE 0.87 0.80  CALCIUM 9.2  --   GLUCOSE 189* 154*   Lab Results  Component Value Date   CKTOTAL 34 05/14/2012   TROPONINI <0.30 05/19/2012   No results found for: PTT Lab Results  Component Value Date   INR 2.29* 06/10/2012   INR 1.89* 06/09/2012   INR 1.85* 06/08/2012    No results found for: CHOL No results found for: HDL No results found for: LDLCALC No results found for: TRIG No results found for: CHOLHDL No results found for: LDLDIRECT    Radiology:  Dg Chest Port 1 View  08/22/2014   CLINICAL DATA:  Shortness of Breath  EXAM: PORTABLE CHEST - 1 VIEW  COMPARISON:  08/18/2014  FINDINGS: Cardiac shadow remains enlarged likely in part due to a known pericardial effusion. Elevation the right hemidiaphragm is again seen. No focal infiltrate or sizable effusion is noted. No acute bony abnormality is seen.  IMPRESSION: Enlargement of the cardiac silhouette secondary to a pericardial effusion. No new abnormality is noted.   Electronically Signed   By: Inez Catalina M.D.   On: 08/22/2014 12:09    EKG: NSR with low voltage QRS   ASSESSMENT/PLAN: 1.  Acute SOB possibly secondary to pericardial effusion.  His symptoms are identical to a few days ago when he presented with SOB. He does not have pleuritic CP today but describes a heaviness and tightening.  He has some degree of volume overload on exam today with LE edema.  Chest xray showed enlarged pericardia silhouette with no edema.  Check BNP. Admit to tele bed for further workup.  O2 sats 93-95% on RA.   2.  Acute pericardial effusion - small to moderate by echo a few days ago. He was supposed to go home on Ibuprofen but did not take it or aleve - He said he didn't need it because he did not have any pain.  Will repeat 2D echo to assess for enlargement/tamponade.  Start Ibuprofen 600mg  TID and colchicine 0.6mg  BID.  Check TSH to rule out  hypothyroidism as etiology.  He has a history of SLE with pleural effusion in the past so this is most likely an effusion secondary to autoimmune etiology from SLE.   3.  HTN - controlled 4.  Dyslipidemia - he is apparently on 2 statins at home so this will need to be addressed.  He says that Dr. Sheryn Bison added simvastatin on to his crestor.     Sueanne Margarita, MD  08/22/2014  2:06 PM

## 2014-08-22 NOTE — Progress Notes (Signed)
*  PRELIMINARY RESULTS* Echocardiogram 2D Echocardiogram has been performed.  Tyler Norris 08/22/2014, 4:27 PM

## 2014-08-22 NOTE — ED Provider Notes (Signed)
CSN: [id number]     Arrival date & time 08/22/14  1109 History   First MD Initiated Contact with Patient 08/22/14 1113     Chief Complaint  Patient presents with  . Shortness of Breath     (Consider location/radiation/quality/duration/timing/severity/associated sxs/prior Treatment) Patient is a 80 y.o. male presenting with shortness of breath. The history is provided by the patient.  Shortness of Breath Associated symptoms: chest pain and cough   Associated symptoms: no abdominal pain, no fever, no headaches, no rash and no vomiting    patient presents with shortness of breath. Had been seen in the ER a couple days for this and diagnosed pericardial effusion. Since it is improved came back again yesterday. Does have some dull chest pain. No fevers. He has had an occasional cough. Has some swelling in his legs. This is very much chronic for him. CT angiogram yesterday showed pericardial effusion without pulmonary embolism or pneumonia he states the pain had improved but then came back again also.Marland Kitchen   Past Medical History  Diagnosis Date  . Kidney stones   . Hyperlipidemia   . GERD (gastroesophageal reflux disease)   . Carpal tunnel syndrome, bilateral   . Sleep apnea     uses cpap every night  . AAA (abdominal aortic aneurysm)     resolved by ct scan 09-2011  . Arthritis   . Anemia   . Barrett's esophagus   . Hypertension   . Pneumonia   . Lupus (systemic lupus erythematosus)    Past Surgical History  Procedure Laterality Date  . Appendectomy    . Hernia repair  1966  . Hand surgery      scar repaired lt hand  . Tonsillectomy    . Cystoscopy w/ stone manipulation  2001,2009  . Carpal tunnel release  10/12/2011    Procedure: CARPAL TUNNEL RELEASE;  Surgeon: Cammie Sickle., MD;  Location: [facility];  Service: Orthopedics;  Laterality: Left;  . Carpal tunnel release  12/01/2011    Procedure: CARPAL TUNNEL RELEASE;  Surgeon: Cammie Sickle., MD;  Location:  [facility];  Service: Orthopedics;  Laterality: Right;  . Knee arthroscopy Left    Family History  Problem Relation Age of Onset  . Heart disease Father   . Hyperlipidemia Father   . Hypertension Father   . Other Father     VARICOSE VEIN  . Peripheral vascular disease Father   . Diabetes Sister    History  Substance Use Topics  . Smoking status: Never Smoker   . Smokeless tobacco: Never Used  . Alcohol Use: No    Review of Systems  Constitutional: Negative for fever, activity change and appetite change.  Eyes: Negative for pain.  Respiratory: Positive for cough and shortness of breath. Negative for chest tightness.   Cardiovascular: Positive for chest pain. Negative for leg swelling.  Gastrointestinal: Negative for nausea, vomiting, abdominal pain and diarrhea.  Genitourinary: Negative for flank pain.  Musculoskeletal: Negative for back pain and neck stiffness.  Skin: Negative for rash.  Neurological: Negative for weakness, numbness and headaches.  Psychiatric/Behavioral: Negative for behavioral problems.      Allergies  Lipitor  Home Medications   Prior to Admission medications   Medication Sig Start Date End Date Taking? Authorizing Provider  aspirin 81 MG tablet Take 81 mg by mouth daily.   Yes Historical Provider, MD  Bioflavonoid Products (BIOFLEX PO) Take 2 tablets by mouth daily.    Yes  Historical Provider, MD  calcium-vitamin D (OSCAL WITH D) 500-200 MG-UNIT per tablet Take 1 tablet by mouth daily with breakfast.    Yes Historical Provider, MD  esomeprazole (NEXIUM) 40 MG capsule Take 40 mg by mouth daily at 12 noon.   Yes Historical Provider, MD  fenofibrate (TRICOR) 145 MG tablet Take 145 mg by mouth daily.   Yes Historical Provider, MD  ferrous sulfate 325 (65 FE) MG tablet Take 325 mg by mouth daily with breakfast.   Yes Historical Provider, MD  folic acid (FOLVITE) 176 MCG tablet Take 400 mcg by mouth daily.   Yes Historical Provider, MD   hydroxychloroquine (PLAQUENIL) 200 MG tablet Take 200 mg by mouth 2 (two) times daily.   Yes Historical Provider, MD  Multiple Vitamin (MULTIVITAMIN WITH MINERALS) TABS Take 1 tablet by mouth daily.   Yes Historical Provider, MD  naproxen sodium (ANAPROX) 220 MG tablet Take 220 mg by mouth 2 (two) times a week. Per bottle as needed   Yes Historical Provider, MD  Omega-3 Fatty Acids (FISH OIL) 1200 MG CAPS Take 1,200 mg by mouth daily.    Yes Historical Provider, MD  rosuvastatin (CRESTOR) 5 MG tablet Take 2.5 mg by mouth daily.    Yes Historical Provider, MD  simvastatin (ZOCOR) 20 MG tablet Take 20 mg by mouth every evening. 08/10/14  Yes Historical Provider, MD  vitamin C (ASCORBIC ACID) 500 MG tablet Take 1,000 mg by mouth daily.    Yes Historical Provider, MD  Vitamin D, Ergocalciferol, (DRISDOL) 50000 UNITS CAPS capsule Take 50,000 Units by mouth every 7 (seven) days.   Yes Historical Provider, MD  vitamin E 400 UNIT capsule Take 400 Units by mouth daily.   Yes Historical Provider, MD   BP 134/69 mmHg  Pulse 79  Temp(Src) 97.8 F (36.6 C) (Oral)  Resp 20  Ht 5\' 9"  (1.753 m)  Wt 216 lb 9.6 oz (98.249 kg)  BMI 31.97 kg/m2  SpO2 96% Physical Exam  Constitutional: He is oriented to person, place, and time. He appears well-developed and well-nourished.  HENT:  Head: Normocephalic and atraumatic.  Eyes: Pupils are equal, round, and reactive to light.  Neck: Normal range of motion.  Cardiovascular: Normal rate, regular rhythm and normal heart sounds.   No murmur heard. Pulmonary/Chest: Effort normal and breath sounds normal. He has no wheezes. He has no rales.  Abdominal: Soft. Bowel sounds are normal. He exhibits no distension. There is no tenderness.  Musculoskeletal: Normal range of motion. He exhibits no edema.  Neurological: He is alert and oriented to person, place, and time. No cranial nerve deficit.  Skin: Skin is warm and dry.  Psychiatric: He has a normal mood and affect.   Nursing note and vitals reviewed.   ED Course  Procedures (including critical care time) Labs Review Labs Reviewed  CBC WITH DIFFERENTIAL/PLATELET - Abnormal; Notable for the following:    WBC 10.8 (*)    Hemoglobin 12.7 (*)    HCT 37.9 (*)    Neutrophils Relative % 81 (*)    Neutro Abs 8.7 (*)    Lymphocytes Relative 9 (*)    Monocytes Absolute 1.1 (*)    All other components within normal limits  COMPREHENSIVE METABOLIC PANEL - Abnormal; Notable for the following:    Glucose, Bld 162 (*)    Total Protein 6.0 (*)    Albumin 3.3 (*)    All other components within normal limits  MAGNESIUM - Abnormal; Notable for the following:  Magnesium 1.5 (*)    All other components within normal limits  CBC WITH DIFFERENTIAL/PLATELET - Abnormal; Notable for the following:    WBC 11.0 (*)    RBC 4.16 (*)    Hemoglobin 12.5 (*)    HCT 36.8 (*)    Neutrophils Relative % 82 (*)    Neutro Abs 9.0 (*)    Lymphocytes Relative 9 (*)    All other components within normal limits  PROTIME-INR - Abnormal; Notable for the following:    Prothrombin Time 16.6 (*)    All other components within normal limits  BASIC METABOLIC PANEL - Abnormal; Notable for the following:    Glucose, Bld 135 (*)    All other components within normal limits  CBC - Abnormal; Notable for the following:    RBC 3.87 (*)    Hemoglobin 11.5 (*)    HCT 34.2 (*)    All other components within normal limits  PROTIME-INR - Abnormal; Notable for the following:    Prothrombin Time 16.4 (*)    All other components within normal limits  I-STAT CHEM 8, ED - Abnormal; Notable for the following:    BUN 21 (*)    Glucose, Bld 154 (*)    Hemoglobin 12.6 (*)    HCT 37.0 (*)    All other components within normal limits  TSH  PHOSPHORUS  APTT  TSH  APTT  I-STAT TROPOININ, ED    Imaging Review Dg Chest Port 1 View  08/22/2014   CLINICAL DATA:  Shortness of Breath  EXAM: PORTABLE CHEST - 1 VIEW  COMPARISON:  08/18/2014   FINDINGS: Cardiac shadow remains enlarged likely in part due to a known pericardial effusion. Elevation the right hemidiaphragm is again seen. No focal infiltrate or sizable effusion is noted. No acute bony abnormality is seen.  IMPRESSION: Enlargement of the cardiac silhouette secondary to a pericardial effusion. No new abnormality is noted.   Electronically Signed   By: Inez Catalina M.D.   On: 08/22/2014 12:09     EKG Interpretation   Date/Time:  Saturday August 22 2014 11:12:23 EDT Ventricular Rate:  86 PR Interval:  151 QRS Duration: 92 QT Interval:  370 QTC Calculation: 442 R Axis:   30 Text Interpretation:  Sinus rhythm Low voltage, extremity and precordial  leads  voltage decreased from previous 2 days ago. Confirmed by Alvino Chapel   MD, Ovid Curd [phone]) on 08/22/2014 11:24:30 AM      MDM   Final diagnoses:  Pericardial effusion    Patient pericardial effusion.  pain worsening shortness of breath. Cardiologist seen the patient will admit.    Davonna Belling, MD 08/23/14 [phone]

## 2014-08-22 NOTE — ED Notes (Signed)
Pt reports SOB that began yesterday.  Pt reports that he was here on Tuesday for the same symptoms and was told he had a pericardial effusion.  Pt reports that his symptoms returned yesterday.

## 2014-08-23 DIAGNOSIS — I313 Pericardial effusion (noninflammatory): Secondary | ICD-10-CM

## 2014-08-23 LAB — CBC
HCT: 34.2 % — ABNORMAL LOW (ref 39.0–52.0)
HEMOGLOBIN: 11.5 g/dL — AB (ref 13.0–17.0)
MCH: 29.7 pg (ref 26.0–34.0)
MCHC: 33.6 g/dL (ref 30.0–36.0)
MCV: 88.4 fL (ref 78.0–100.0)
PLATELETS: 280 10*3/uL (ref 150–400)
RBC: 3.87 MIL/uL — ABNORMAL LOW (ref 4.22–5.81)
RDW: 12.4 % (ref 11.5–15.5)
WBC: 8.4 10*3/uL (ref 4.0–10.5)

## 2014-08-23 LAB — BASIC METABOLIC PANEL
Anion gap: 6 (ref 5–15)
BUN: 19 mg/dL (ref 6–20)
CALCIUM: 9.2 mg/dL (ref 8.9–10.3)
CHLORIDE: 105 mmol/L (ref 101–111)
CO2: 27 mmol/L (ref 22–32)
Creatinine, Ser: 0.92 mg/dL (ref 0.61–1.24)
GFR calc non Af Amer: >60 mL/min (ref >60)
GLUCOSE: 135 mg/dL — AB (ref 65–99)
POTASSIUM: 3.9 mmol/L (ref 3.5–5.1)
Sodium: 138 mmol/L (ref 135–145)

## 2014-08-23 LAB — APTT: aPTT: 33 seconds (ref 24–37)

## 2014-08-23 LAB — PROTIME-INR
INR: 1.3 (ref 0.00–1.49)
PROTHROMBIN TIME: 16.4 s — AB (ref 11.6–15.2)

## 2014-08-23 NOTE — Progress Notes (Signed)
SUBJECTIVE:  SOB improved today  OBJECTIVE:   Vitals:   Filed Vitals:   08/22/14 1441 08/22/14 1723 08/22/14 1936 08/23/14 0448  BP: 129/64  132/67 137/74  Pulse: 85  78 80  Temp: 97.8 F (36.6 C)  97.6 F (36.4 C) 97.8 F (36.6 C)  TempSrc: Oral  Oral Oral  Resp: 21  22 20   Height:  5\' 9"  (1.753 m)    Weight:  216 lb 9.6 oz (98.249 kg)    SpO2: 96%  98% 97%   I&O's:  No intake or output data in the 24 hours ending 08/23/14 0732 TELEMETRY: Reviewed telemetry pt in NSR:     PHYSICAL EXAM General: Well developed, well nourished, in no acute distress Head: Eyes PERRLA, No xanthomas.   Normal cephalic and atramatic  Lungs:   Clear bilaterally to auscultation and percussion. Heart:   HRRR S1 S2 Pulses are 2+ & equal. No rub noted Abdomen: Bowel sounds are positive, abdomen soft and non-tender without masses  Extremities:   No clubbing, cyanosis or edema.  DP +1 Neuro: Alert and oriented X 3. Psych:  Good affect, responds appropriately   LABS: Basic Metabolic Panel:  Recent Labs  08/22/14 1600 08/23/14 0303  NA 137 138  K 4.2 3.9  CL 105 105  CO2 23 27  GLUCOSE 162* 135*  BUN 19 19  CREATININE 0.89 0.92  CALCIUM 9.1 9.2  MG 1.5*  --   PHOS 3.9  --    Liver Function Tests:  Recent Labs  08/22/14 1600  AST 18  ALT 20  ALKPHOS 43  BILITOT 0.8  PROT 6.0*  ALBUMIN 3.3*   No results for input(s): LIPASE, AMYLASE in the last 72 hours. CBC:  Recent Labs  08/22/14 1140  08/22/14 1600 08/23/14 0303  WBC 10.8*  --  11.0* 8.4  NEUTROABS 8.7*  --  9.0*  --   HGB 12.7*  < > 12.5* 11.5*  HCT 37.9*  < > 36.8* 34.2*  MCV 88.3  --  88.5 88.4  PLT 346  --  317 280  < > = values in this interval not displayed. Cardiac Enzymes: No results for input(s): CKTOTAL, CKMB, CKMBINDEX, TROPONINI in the last 72 hours. BNP: Invalid input(s): POCBNP D-Dimer: No results for input(s): DDIMER in the last 72 hours. Hemoglobin A1C: No results for input(s): HGBA1C in  the last 72 hours. Fasting Lipid Panel: No results for input(s): CHOL, HDL, LDLCALC, TRIG, CHOLHDL, LDLDIRECT in the last 72 hours. Thyroid Function Tests:  Recent Labs  08/22/14 1600  TSH 0.586   Anemia Panel: No results for input(s): VITAMINB12, FOLATE, FERRITIN, TIBC, IRON, RETICCTPCT in the last 72 hours. Coag Panel:   Lab Results  Component Value Date   INR 1.30 08/23/2014   INR 1.33 08/22/2014   INR 2.29* 06/10/2012    RADIOLOGY: Ct Angio Chest Pe W/cm &/or Wo Cm  08/18/2014   CLINICAL DATA:  80 year old male with shortness of breath and chest pain for 3 days. Initial encounter.  EXAM: CT ANGIOGRAPHY CHEST WITH CONTRAST  TECHNIQUE: Multidetector CT imaging of the chest was performed using the standard protocol during bolus administration of intravenous contrast. Multiplanar CT image reconstructions and MIPs were obtained to evaluate the vascular anatomy.  CONTRAST:  127mL OMNIPAQUE IOHEXOL 350 MG/ML SOLN  COMPARISON:  Chest CTA 06/05/2012  FINDINGS: Good contrast bolus timing in the pulmonary arterial tree.  No focal filling defect identified in the pulmonary arterial tree to suggest the presence  of acute pulmonary embolism.  New pericardial effusion up to 2 cm in thickness, and greater along the anterior surface than the posterior surface of the heart. See series 4, image 60. No mediastinal lymphadenopathy. Chronic cardiomegaly. Chronic Coronary artery and aortic calcified atherosclerosis. Little contrast in the aorta on today's exam.  Bilateral small to moderate layering pleural effusions, mildly smaller than in 2014.  Atelectatic changes to the major airways. Compressive lower lobe and lingula atelectasis. Small volume of pleural fluid in the right fissures. No consolidation.  No acute osseous abnormality identified.  Negative visualized liver, spleen, pancreas, adrenal glands, and bowel in the upper abdomen.  Review of the MIP images confirms the above findings.  IMPRESSION: 1.  Negative for acute pulmonary embolism, but positive for new moderate size pericardial effusion. See series 4, image 60. 2. Chronic cardiomegaly. Small bilateral layering pleural effusions with compressive atelectasis.   Electronically Signed   By: Genevie Ann M.D.   On: 08/18/2014 13:53   Dg Chest Port 1 View  08/22/2014   CLINICAL DATA:  Shortness of Breath  EXAM: PORTABLE CHEST - 1 VIEW  COMPARISON:  08/18/2014  FINDINGS: Cardiac shadow remains enlarged likely in part due to a known pericardial effusion. Elevation the right hemidiaphragm is again seen. No focal infiltrate or sizable effusion is noted. No acute bony abnormality is seen.  IMPRESSION: Enlargement of the cardiac silhouette secondary to a pericardial effusion. No new abnormality is noted.   Electronically Signed   By: Inez Catalina M.D.   On: 08/22/2014 12:09   Dg Chest Port 1 View  08/18/2014   CLINICAL DATA:  One day history of chest pain and shortness of breath  EXAM: PORTABLE CHEST - 1 VIEW  COMPARISON:  May 19, 2014  FINDINGS: There is hazy opacity in the lateral right base, suspicious for focal pneumonia. The lungs elsewhere are clear. Heart is mildly enlarged with pulmonary vascularity within normal limits. No adenopathy. No bone lesions.  IMPRESSION: Lateral right base infiltrate. Lungs otherwise clear. Stable cardiac enlargement. Followup PA and lateral chest radiographs recommended in 3-4 weeks following trial of antibiotic therapy to ensure resolution and exclude underlying malignancy.   Electronically Signed   By: Lowella Grip III M.D.   On: 08/18/2014 11:16   ASSESSMENT/PLAN: 1. Acute SOB possibly secondary to pericardial effusion. His symptoms are identical to a few days ago when he presented with SOB. He does not have pleuritic CP today but describes a heaviness and tightening.  Chest xray showed enlarged pericardia silhouette with no edema. Check BNP 2. Acute pericardial effusion - small to moderate by echo a few days  ago but now moderate to large and partially organized. He was supposed to go home on Ibuprofen but did not take it or aleve - He said he didn't need it because he did not have any pain. Continue Ibuprofen 600mg  TID and colchicine 0.6mg  BID.TSH is normal. He has a history of SLE with pleural effusion in the past so this is most likely an effusion secondary to autoimmune etiology from SLE. He denies any recent URI.  I will consult CVTS today for pericardial window. 3. HTN - controlled 4. Dyslipidemia - he is apparently on 2 statins at home so this will need to be addressed. He says that Dr. Sheryn Bison added simvastatin on to his crestor.      Sueanne Margarita, MD  08/23/2014  7:32 AM

## 2014-08-23 NOTE — Progress Notes (Signed)
[address]       [place] [address]             [phone]      Tyler Norris is a 80 yo man who presented to the ED yesterday with a cc/o shortness of breath.   He is a 80yo WM with a history of hypertension, dyslipidemia, lupus with a history of a pleural effusion, GERD/ Barrett's esophagus, SLE with history of pleural effusion, and sleep apnea. He was seen in the ED on 08/18/14 with a c/o plueritic chest pain. CT of the chest was negative for PE but showed a pericardial effusion. An echocardiogram was done. There was a moderate pericardial effusion with no evidence of tamponade. Dr. Servando Snare saw him, but there was no indication for surgery at that time. He was sent home on ibuprofen, but never took it because the pain resolved. showed mild to moderate effusion but no tamponade. CVTS was consulted and recommended NSAIDs and outpt cardiology followup.   Yesterday he presented to the ED again complaining of SOB since the preceding night.He described as a sensation of not being able to get enough air into his lungs.  He also complained of feeling nausea and fatigued. He is not having the same type of pain he presented with last week but felt a pressure sensation in his left chest.  In the ED he was hemodynamically stable, but a repeat echo showed progression of the pericardial effusion and possible early tamponade.  Since admission his symptoms have improved.  Objective: Vital signs in last 24 hours: Temp:  [97.6 F (36.4 C)-97.8 F (36.6 C)] 97.8 F (36.6 C) (06/26 1352) Pulse Rate:  [78-80] 79 (06/26 1352) Cardiac Rhythm:  [-] Normal sinus rhythm (06/26 0815) Resp:  [20-22] 20 (06/26 1352) BP: (132-137)/(67-74) 134/69 mmHg (06/26 1352) SpO2:  [96 %-98 %] 96 % (06/26 1352)  Hemodynamic parameters for last 24 hours:    Intake/Output from previous day:   Intake/Output this shift: Total I/O In: 3 [I.V.:3] Out: -   General appearance: alert and no  distress Neurologic: A&O x 3, no focal deficit Heart: regular rate and rhythm and no rub Lungs: clear to auscultation bilaterally Extremities: no edema, well perfused, pulses 1+ bilaterally no JVD  Lab Results:  Recent Labs  08/22/14 1600 08/23/14 0303  WBC 11.0* 8.4  HGB 12.5* 11.5*  HCT 36.8* 34.2*  PLT 317 280   BMET:  Recent Labs  08/22/14 1600 08/23/14 0303  NA 137 138  K 4.2 3.9  CL 105 105  CO2 23 27  GLUCOSE 162* 135*  BUN 19 19  CREATININE 0.89 0.92  CALCIUM 9.1 9.2    PT/INR:  Recent Labs  08/23/14 0303  LABPROT 16.4*  INR 1.30   ABG    Component Value Date/Time   TCO2 20 08/22/2014 1148   CBG (last 3)  No results for input(s): GLUCAP in the last 72 hours.  Assessment/Plan: S/P   -  80 yo man with a SLE and a pericardial effusion. He was non-compliant with NSAIDS for treatment of the effusion last week. He now presents with worsening symptoms and echo shows the effusion has enlarged.  Clinically he does not have any signs of cardiac tamponade. His symptoms have improved since admission, while on colchicine. He likely would benefit from a pericardial window, but it is not emergent.  Will notify Dr. Servando Snare of the patient's admission.    LOS: 1 day  Tyler Norris 08/23/2014

## 2014-08-24 ENCOUNTER — Other Ambulatory Visit: Payer: Self-pay | Admitting: Cardiology

## 2014-08-24 DIAGNOSIS — T50905S Adverse effect of unspecified drugs, medicaments and biological substances, sequela: Secondary | ICD-10-CM

## 2014-08-24 DIAGNOSIS — I313 Pericardial effusion (noninflammatory): Secondary | ICD-10-CM

## 2014-08-24 DIAGNOSIS — R0600 Dyspnea, unspecified: Secondary | ICD-10-CM

## 2014-08-24 DIAGNOSIS — I3139 Other pericardial effusion (noninflammatory): Secondary | ICD-10-CM

## 2014-08-24 DIAGNOSIS — E612 Magnesium deficiency: Secondary | ICD-10-CM

## 2014-08-24 MED ORDER — COLCHICINE 0.6 MG PO TABS: 0.6000 mg | ORAL_TABLET | Freq: Two times a day (BID) | ORAL | Status: DC

## 2014-08-24 MED ORDER — IBUPROFEN 600 MG PO TABS: 600.0000 mg | ORAL_TABLET | Freq: Three times a day (TID) | ORAL | Status: DC

## 2014-08-24 MED ORDER — MAGNESIUM OXIDE 400 (241.3 MG) MG PO TABS: 400.0000 mg | ORAL_TABLET | Freq: Two times a day (BID) | ORAL | Status: DC

## 2014-08-24 MED ORDER — MAGNESIUM OXIDE 400 (241.3 MG) MG PO TABS
400.0000 mg | ORAL_TABLET | Freq: Two times a day (BID) | ORAL | Status: DC
  Administered 2014-08-24: 400 mg via ORAL
  Filled 2014-08-24 (×2): qty 1

## 2014-08-24 NOTE — Progress Notes (Addendum)
      [address]       [place] [address]             [phone]             Subjective: Patient at sink shaving. States his breathing is ok.  Objective: Vital signs in last 24 hours: Temp:  [97.7 F (36.5 C)-98.2 F (36.8 C)] 98.2 F (36.8 C) (06/27 0507) Pulse Rate:  [74-79] 74 (06/27 0507) Cardiac Rhythm:  [-] Normal sinus rhythm (06/27 0743) Resp:  [20] 20 (06/27 0507) BP: (134-149)/(66-69) 149/67 mmHg (06/27 0507) SpO2:  [96 %-98 %] 96 % (06/27 0507)   Current Weight  08/22/14 216 lb 9.6 oz (98.249 kg)      Intake/Output from previous day: 06/26 0701 - 06/27 0700 In: 3 [I.V.:3] Out: -    Physical Exam:  Cardiovascular: RRR, no murmurs or rub. Pulmonary: Clear to auscultation bilaterally; no rales, wheezes, or rhonchi. Abdomen: Soft, non tender, bowel sounds present.    Lab Results: CBC: Recent Labs  08/22/14 1600 08/23/14 0303  WBC 11.0* 8.4  HGB 12.5* 11.5*  HCT 36.8* 34.2*  PLT 317 280   BMET:  Recent Labs  08/22/14 1600 08/23/14 0303  NA 137 138  K 4.2 3.9  CL 105 105  CO2 23 27  GLUCOSE 162* 135*  BUN 19 19  CREATININE 0.89 0.92  CALCIUM 9.1 9.2    PT/INR:  Lab Results  Component Value Date   INR 1.30 08/23/2014   INR 1.33 08/22/2014   INR 2.29* 06/10/2012   ABG:  INR: Will add last result for INR, ABG once components are confirmed Will add last 4 CBG results once components are confirmed  Assessment/Plan:  1. CV - SR in the 70's. SBP mostly in the 130-140's. Echo done 06/25 showed a moderate pericardial effusion with possible early tamponade. Will discuss with Dr. Servando Snare about timimg (not emergent) of pericardial window. On Colchicine and Ibuprofen 2.  Pulmonary - On room air 3.  Acute blood loss anemia - H and H yesterday 11.5 and 34.2 On ferrous sulfate.  Allyn Bartelson MPA-C 08/24/2014,7:55 AM

## 2014-08-24 NOTE — Discharge Summary (Signed)
Physician Discharge Summary       Patient ID: ALARIC GLADWIN MRN: [id number] DOB/AGE: 78/01/1935 78 y.o.  Admit date: 08/22/2014 Discharge date: 08/24/2014 Primary Cardiologist:Dr. Nahser - NEW  Discharge Diagnoses:  Principal Problem:   SOB (shortness of breath) Active Problems:   Pericardial effusion   HTN (hypertension)   OSA on CPAP   Discharged Condition: good  Procedures: none  Hospital Course: 80yo WM with a history of dyslipidemia, GERD with Barretts esophagus, SLE with history of pleural effusion now on plaquenil, OSA and HTN who was recently seen in the ER with complaints of pleuritic CP. CT chest was negative for PE but did show a pericardial effusion. Echo showed mild to moderate effusion but no tamponade. CVTS was consulted and recommended NSAIDs and outpt cardiology followup. He was told to start Ibuprofen but he did not start it. He takes Aleve as needed for pain PRN and has not taken that either. 08/22/14 he presented to the ER with complaints of SOB. Last night around 11pm he started to feel like he was not getting enough oxygen and felt very lethargic. He then started getting nauseated with no energy. His symptoms worsened and he stayed in bed. He stated that if he inhales he felt that he couldnot get enough air in. He also noticed a heaviness and tightening in his left chest with no radiation. He came to the ER for further evaluation and says that he is feeling better.  Echo revealed pericardial effusion now mod to large.  TCTS also consulted on the pt.  He does not have any signs of cardiac tamponade. His symptoms have improved on colchicine.    By 08/24/14 pt is stable without further SOB and was seen and evaluated by Dr. Percival Spanish and found stable for discharge.   We will obtain a limited echo on Friday and he will be seen by APP.   Dr. Percival Spanish discussed symptoms to watch for and he will call if this occurs.  He is on high dose NSAIDS and colchicine for 4  weeks then to be determined by echo and symptoms.   Pt will be TCM for this Friday.  His limited echo is also scheduled for then.  His Mag. Is low mag oxide added to meds with recheck of Mg+ level with BMP on Friday as well.   Consults: vascular surgery  Significant Diagnostic Studies:  BMP Latest Ref Rng 08/23/2014 08/22/2014 08/22/2014  Glucose 65 - 99 mg/dL 135(H) 162(H) 154(H)  BUN 6 - 20 mg/dL 19 19 21(H)  Creatinine 0.61 - 1.24 mg/dL 0.92 0.89 0.80  Sodium 135 - 145 mmol/L 138 137 138  Potassium 3.5 - 5.1 mmol/L 3.9 4.2 4.3  Chloride 101 - 111 mmol/L 105 105 105  CO2 22 - 32 mmol/L 27 23 -  Calcium 8.9 - 10.3 mg/dL 9.2 9.1 -   CBC Latest Ref Rng 08/23/2014 08/22/2014 08/22/2014  WBC 4.0 - 10.5 K/uL 8.4 11.0(H) -  Hemoglobin 13.0 - 17.0 g/dL 11.5(L) 12.5(L) 12.6(L)  Hematocrit 39.0 - 52.0 % 34.2(L) 36.8(L) 37.0(L)  Platelets 150 - 400 K/uL 280 317 -    Mg 1.5  Troponin 0.00 TSH 0.628   EKG: Sinus rhythm rate 86 Low voltage, extremity and precordial leads voltage decreased from previous 2 days ago.   ECHO: Study Conclusions - Left ventricle: The cavity size was normal. Wall thickness was increased in a pattern of mild LVH. Systolic function was normal. The estimated ejection fraction was in the range of  60% to 65%. - Ventricular septum: There is mild respirophasic variation in the interventricular septum position. - Aortic valve: There was no stenosis. - Aorta: Mildly dilated aortic root. Aortic root dimension: 40 mm (ED). - Mitral valve: Mildly calcified annulus. There was no significant regurgitation. - Left atrium: The atrium was mildly dilated. - Right ventricle: The cavity size was mildly dilated. Systolic function was normal. - Systemic veins: IVC measured 2.6 cm. There was some respirophasic variation but < 50%, suggesting RA pressure 15 mmHg. - Pericardium, extracardiac: There is a moderate to large partially organized pericardial effusion.  There is > 25% respirophasic variation in the mitral E inflow velocity. IVC dilated as above. There is mild RV early diastolic indentation. Overall there is suggestion of hemodynamic effect i.e. early tamponade. Impressions: - Limited echo for pericardial effusion. See above: moderate to large partially organized pericardial effusion with evidence for hemodynamic effect, possible early tamponade.    Discharge Exam: Blood pressure 149/67, pulse 74, temperature 98.2 F (36.8 C), temperature source Oral, resp. rate 20, height 5\' 9"  (1.753 m), weight 216 lb 9.6 oz (98.249 kg), SpO2 96 %.  Disposition: 01-Home or Self Care     Medication List    STOP taking these medications        naproxen sodium 220 MG tablet  Commonly known as:  ANAPROX     simvastatin 20 MG tablet  Commonly known as:  ZOCOR      TAKE these medications        aspirin 81 MG tablet  Take 81 mg by mouth daily.     BIOFLEX PO  Take 2 tablets by mouth daily.     calcium-vitamin D 500-200 MG-UNIT per tablet  Commonly known as:  OSCAL WITH D  Take 1 tablet by mouth daily with breakfast.     colchicine 0.6 MG tablet  Take 1 tablet (0.6 mg total) by mouth 2 (two) times daily.     esomeprazole 40 MG capsule  Commonly known as:  NEXIUM  Take 40 mg by mouth daily at 12 noon.     fenofibrate 145 MG tablet  Commonly known as:  TRICOR  Take 145 mg by mouth daily.     ferrous sulfate 325 (65 FE) MG tablet  Take 325 mg by mouth daily with breakfast.     Fish Oil 1200 MG Caps  Take 1,200 mg by mouth daily.     folic acid 518 MCG tablet  Commonly known as:  FOLVITE  Take 400 mcg by mouth daily.     ibuprofen 600 MG tablet  Commonly known as:  ADVIL,MOTRIN  Take 1 tablet (600 mg total) by mouth 3 (three) times daily.     magnesium oxide 400 (241.3 MG) MG tablet  Commonly known as:  MAG-OX  Take 1 tablet (400 mg total) by mouth 2 (two) times daily.     multivitamin with minerals Tabs tablet    Take 1 tablet by mouth daily.     PLAQUENIL 200 MG tablet  Generic drug:  hydroxychloroquine  Take 200 mg by mouth 2 (two) times daily.     rosuvastatin 5 MG tablet  Commonly known as:  CRESTOR  Take 2.5 mg by mouth daily.     vitamin C 500 MG tablet  Commonly known as:  ASCORBIC ACID  Take 1,000 mg by mouth daily.     Vitamin D (Ergocalciferol) 50000 UNITS Caps capsule  Commonly known as:  DRISDOL  Take 50,000 Units by mouth  every 7 (seven) days.     vitamin E 400 UNIT capsule  Take 400 Units by mouth daily.       Follow-up Information    Follow up with Nahser, Wonda Cheng, MD On 08/28/2014.   Specialty:  Cardiology   Why:  with [name], NP at 9:30 AM   Contact information:   [address] [place] [address] [phone]       Follow up with [facility] Office On 08/28/2014.   Specialty:  Cardiology   Why:  for echo, before the PA appt.    Contact information:   [address], [place] [phone]       Discharge Instructions: Call if increasing SOB.  You will have repeat echo on Friday at [facility] office at 8:30 Am and then see [name], NP for follow up.  Dr. Acie Fredrickson is noted as your primary cardiologist.   Continue to take the colchicine and Ibuprofen for 4 weeks.  This may be adjusted in the office depending on the Echo and you symptoms.  Heart Healthy low salt diet.  Signed: [name] Nurse Practitioner-Certified [facility]: [facility] 08/24/2014, 10:05 AM  Time spent on discharge :>35 minutes.    Patient seen and examined.  Plan as discussed in my rounding note for today and outlined above. Jeneen Rinks [facility]  08/24/2014  10:08 AM

## 2014-08-24 NOTE — Care Management (Signed)
Important Message  Patient Details  Name: Tyler Norris MRN: [id number] Date of Birth: Mar 23, 1934   Medicare Important Message Given:  Yes-second notification given    Nathen May 08/24/2014, 1:07 PM

## 2014-08-24 NOTE — Care Management (Signed)
Important Message  Patient Details  Name: Tyler Norris MRN: [id number] Date of Birth: [birth year]   Medicare Important Message Given:  Yes-second notification given    Maryclare Labrador, RN 08/24/2014, 11:00 AM

## 2014-08-24 NOTE — Progress Notes (Signed)
UR Completed. Clary Meeker, RN, BSN.  [phone] 

## 2014-08-24 NOTE — Telephone Encounter (Signed)
New message     TCM appt on 7.1.2016 per Cecilie Kicks    Appt with [name]

## 2014-08-24 NOTE — Care Management Note (Signed)
Case Management Note  Patient Details  Name: Tyler Norris MRN: [id number] Date of Birth: 04-14-1934  Subjective/Objective:     Pt admitted with HTN and confirmed pericardial effusion               Action/Plan:  Pt is independent from home with wife.  CM will continue to monitor for disposition needs   Expected Discharge Date:                  Expected Discharge Plan:  Home/Self Care  In-House Referral:     Discharge planning Services  CM Consult  Post Acute Care Choice:    Choice offered to:     DME Arranged:    DME Agency:     HH Arranged:    [place] Agency:     Status of Service:  Completed, signed off  Medicare Important Message Given:    Date Medicare IM Given:    Medicare IM give by:    Date Additional Medicare IM Given:    Additional Medicare Important Message give by:     If discussed at [place] of Stay Meetings, dates discussed:    Additional Comments: 07/2714 Elenor Quinones, RN, BSN [phone] CM assessed pt, pt stated he is independent at home with following equipment: cane and walker.  No CM needs.   Maryclare Labrador, RN 08/24/2014, 10:56 AM

## 2014-08-24 NOTE — Progress Notes (Signed)
    SUBJECTIVE:  The patient has no symptoms.  He has no SOB and no pain.  He thinks that he is almost breathing at baseline.     PHYSICAL EXAM Filed Vitals:   08/23/14 0448 08/23/14 1352 08/23/14 1954 08/24/14 0507  BP: 137/74 134/69 140/66 149/67  Pulse: 80 79 77 74  Temp: 97.8 F (36.6 C) 97.8 F (36.6 C) 97.7 F (36.5 C) 98.2 F (36.8 C)  TempSrc: Oral Oral Oral Oral  Resp: 20 20 20 20   Height:      Weight:      SpO2: 97% 96% 98% 96%   General:  No distress Lungs:  Clear Neck:  No JVD Heart:  RRR, no rub Abdomen:  Positive bowel sounds, no rebound no guarding Extremities:  No edema  Neuro:  Nonfocal  LABS:  No results found for this or any previous visit (from the past 24 hour(s)).  Intake/Output Summary (Last 24 hours) at 08/24/14 0815 Last data filed at 08/23/14 1016  Gross per 24 hour  Intake      3 ml  Output      0 ml  Net      3 ml     ASSESSMENT AND PLAN:  Pericardial effusion:  He has no physical findings of tamponade.  His effusion was larger (I have looked at the images myself.)  However, he was not receiving any treatment.  Now he is on high dose NSAIDs and colchicine.  He has absolutely no symptoms and his manual BP is negative for pulsus paradox.  He is not hypotensive.  I think that he can be watched at home and with an early repeat echo in our office.  We will arrange this and follow appt in the office for later this week.  I have discussed with him the symptoms that could occur if he has any increase in the effusion.    HTN:  Rate is controlled.    ANEMIA:  Probably from blood draws and chronic disease.   Jeneen Rinks [facility] 08/24/2014 8:15 AM

## 2014-08-24 NOTE — Progress Notes (Signed)
IV and tele monitor d/c at this time; pt given d/c instructions; pt verbalized understanding; pt to d/c home with family; will cont. To monitor.

## 2014-08-24 NOTE — Discharge Instructions (Signed)
Call if increasing SOB.  You will have repeat echo on Friday at [facility] office at 8:30 Am and then see [name], NP for follow up.  Dr. Acie Fredrickson is noted as your primary cardiologist.   Continue to take the colchicine and Ibuprofen for 4 weeks.  This may be adjusted in the office depending on the Echo and you symptoms.  Heart Healthy low salt diet.

## 2014-08-25 NOTE — Telephone Encounter (Signed)
Left message to call back  

## 2014-08-26 NOTE — Telephone Encounter (Signed)
Voice message left for him to call back

## 2014-08-27 NOTE — Telephone Encounter (Signed)
TCM call - Attempted on three separate days to contact patient by LM on VM. Pt did not return call and had previously stated he is seeking a new cardiology office. Closing Encounter at this time. Routed to C. Sharolyn Douglas, NP and Cecilie Kicks, NP.

## 2014-08-27 NOTE — Telephone Encounter (Signed)
lmtcb

## 2014-08-28 ENCOUNTER — Encounter: Payer: Self-pay | Admitting: Nurse Practitioner

## 2014-08-28 ENCOUNTER — Ambulatory Visit (HOSPITAL_COMMUNITY): Payer: Medicare Other | Attending: Cardiology

## 2014-08-28 ENCOUNTER — Other Ambulatory Visit: Payer: Self-pay

## 2014-08-28 ENCOUNTER — Ambulatory Visit (INDEPENDENT_AMBULATORY_CARE_PROVIDER_SITE_OTHER): Payer: Medicare Other | Admitting: Nurse Practitioner

## 2014-08-28 VITALS — BP 150/82 | HR 73 | Resp 20 | Ht 69.0 in | Wt 217.0 lb

## 2014-08-28 DIAGNOSIS — R0600 Dyspnea, unspecified: Secondary | ICD-10-CM | POA: Diagnosis not present

## 2014-08-28 DIAGNOSIS — I3139 Other pericardial effusion (noninflammatory): Secondary | ICD-10-CM

## 2014-08-28 DIAGNOSIS — E612 Magnesium deficiency: Secondary | ICD-10-CM

## 2014-08-28 DIAGNOSIS — I313 Pericardial effusion (noninflammatory): Secondary | ICD-10-CM

## 2014-08-28 DIAGNOSIS — I517 Cardiomegaly: Secondary | ICD-10-CM | POA: Diagnosis not present

## 2014-08-28 DIAGNOSIS — I319 Disease of pericardium, unspecified: Secondary | ICD-10-CM

## 2014-08-28 DIAGNOSIS — T50905S Adverse effect of unspecified drugs, medicaments and biological substances, sequela: Secondary | ICD-10-CM

## 2014-08-28 LAB — BASIC METABOLIC PANEL
BUN: 14 mg/dL (ref 6–23)
CO2: 25 mEq/L (ref 19–32)
Calcium: 9.4 mg/dL (ref 8.4–10.5)
Chloride: 106 mEq/L (ref 96–112)
Creatinine, Ser: 0.76 mg/dL (ref 0.40–1.50)
GFR: 105.37 mL/min (ref >60.00)
Glucose, Bld: 145 mg/dL — ABNORMAL HIGH (ref 70–99)
POTASSIUM: 3.8 meq/L (ref 3.5–5.1)
SODIUM: 140 meq/L (ref 135–145)

## 2014-08-28 LAB — MAGNESIUM: Magnesium: 1.4 mg/dL — ABNORMAL LOW (ref 1.5–2.5)

## 2014-08-28 NOTE — Progress Notes (Addendum)
Patient Name: Tyler Norris Date of Encounter: 08/28/2014  Primary Care Provider:  Cammy Copa, MD Primary Cardiologist:  Joaquim Nam, MD   Chief Complaint  80 year old male who presents for follow-up related to recent hospitalization for moderate to large pericardial effusion.  Past Medical History   Past Medical History  Diagnosis Date  . Pericardial effusion     a. 08/22/2014 Echo: EF 60-65%, mildly dil Ao root (27mm), mildly dil LA, mod-large partially organized pericardial effusion with possible early tamponade - asymptomatic on high dose nsaids-->conservative mgmt.  . Hyperlipidemia   . GERD (gastroesophageal reflux disease)   . Carpal tunnel syndrome, bilateral   . Sleep apnea     a. uses cpap every night  . AAA (abdominal aortic aneurysm)     a. resolved by ct scan 09-2011  . Arthritis   . Anemia   . Barrett's esophagus   . Essential hypertension   . History of pneumonia   . Lupus (systemic lupus erythematosus)   . Nephrolithiasis    Past Surgical History  Procedure Laterality Date  . Appendectomy    . Hernia repair  1966  . Hand surgery      scar repaired lt hand  . Tonsillectomy    . Cystoscopy w/ stone manipulation  2001,2009  . Carpal tunnel release  10/12/2011    Procedure: CARPAL TUNNEL RELEASE;  Surgeon: Cammie Sickle., MD;  Location: [facility];  Service: Orthopedics;  Laterality: Left;  . Carpal tunnel release  12/01/2011    Procedure: CARPAL TUNNEL RELEASE;  Surgeon: Cammie Sickle., MD;  Location: [facility];  Service: Orthopedics;  Laterality: Right;  . Knee arthroscopy Left     Allergies  Allergies  Allergen Reactions  . Lipitor [Atorvastatin Calcium]     Myalgias    HPI  80 year old male with the above complex problem list. He has a history of lupus and is followed closely by rheumatology. He was recently seen in the emergency room on June 21 with complaints of pleuritic chest pain. CT of  the chest was negative for PE or dissection but did show pericardial effusion. An echo was done showing a mild to moderate effusion without evidence of tamponade. A course of non-steroidal anti-inflammatory drugs was recommended and patient was seen by CT surgery without need for surgical intervention. He was discharged from the ED. Following discharge from the, he was only taking ibuprofen as needed and thus was likely underdosed. He presented back to the emergency department on June 25 with dyspnea and pleuritic chest pain. Repeat echo showed a moderate to large effusion with possible early tamponade not. He was placed on colchicine and high-dose ibuprofen with improvement in her chest pain and dyspnea. He was seen by CT surgery and as he was hemodynamically stable, he was not felt to clinically have any signs or symptoms of cardiac tamponade. There was discussion about possible benefit of placement of a pericardial window however it was ultimately felt that a trial of medical therapy was warranted as he previously was noncompliant with NSAIDs.  Since discharge, he has been taking ibuprofen 60 mg 3 times a day and also colchicine 0.69 g twice a day. He is feeling much better and has not had any recurrence of pleuritic chest pain or dyspnea. He is also taking Nexium for GI protection. He denies PND, orthopnea, dizziness, syncope, or early satiety. He does have some degree of mild chronic lower extremity edema.  Home Medications  Prior to Admission medications   Medication Sig Start Date End Date Taking? Authorizing Provider  aspirin 81 MG tablet Take 81 mg by mouth daily.    Historical Provider, MD  Bioflavonoid Products (BIOFLEX PO) Take 2 tablets by mouth daily.     Historical Provider, MD  calcium-vitamin D (OSCAL WITH D) 500-200 MG-UNIT per tablet Take 1 tablet by mouth daily with breakfast.     Historical Provider, MD  colchicine 0.6 MG tablet Take 1 tablet (0.6 mg total) by mouth 2 (two) times  daily. 08/24/14   [name], NP  esomeprazole (NEXIUM) 40 MG capsule Take 40 mg by mouth daily at 12 noon.    Historical Provider, MD  fenofibrate (TRICOR) 145 MG tablet Take 145 mg by mouth daily.    Historical Provider, MD  ferrous sulfate 325 (65 FE) MG tablet Take 325 mg by mouth daily with breakfast.    Historical Provider, MD  folic acid (FOLVITE) 476 MCG tablet Take 400 mcg by mouth daily.    Historical Provider, MD  hydroxychloroquine (PLAQUENIL) 200 MG tablet Take 200 mg by mouth 2 (two) times daily.    Historical Provider, MD  ibuprofen (ADVIL,MOTRIN) 600 MG tablet Take 1 tablet (600 mg total) by mouth 3 (three) times daily. 08/24/14   [name], NP  magnesium oxide (MAG-OX) 400 (241.3 MG) MG tablet Take 1 tablet (400 mg total) by mouth 2 (two) times daily. 08/24/14   [name], NP  Multiple Vitamin (MULTIVITAMIN WITH MINERALS) TABS Take 1 tablet by mouth daily.    Historical Provider, MD  Omega-3 Fatty Acids (FISH OIL) 1200 MG CAPS Take 1,200 mg by mouth daily.     Historical Provider, MD  rosuvastatin (CRESTOR) 5 MG tablet Take 2.5 mg by mouth daily.     Historical Provider, MD  vitamin C (ASCORBIC ACID) 500 MG tablet Take 1,000 mg by mouth daily.     Historical Provider, MD  Vitamin D, Ergocalciferol, (DRISDOL) 50000 UNITS CAPS capsule Take 50,000 Units by mouth every 7 (seven) days.    Historical Provider, MD  vitamin E 400 UNIT capsule Take 400 Units by mouth daily.    Historical Provider, MD    Review of Systems  Doing much better on non-steroidal and colchicine therapy without chest pain, dyspnea, PND, orthopnea, dizziness, syncope, or early satiety. He does have some degree of bilateral chronic lower extremity swelling which is unchanged.  All other systems reviewed and are otherwise negative except as noted above.  Physical Exam  VS:  BP 150/82 mmHg  Pulse 73  Resp 20  Ht 5\' 9"  (1.753 m)  Wt 217 lb (98.431 kg)  BMI 32.03 kg/m2 , BMI Body mass index is 32.03  kg/(m^2). GEN: Well nourished, well developed, in no acute distress. HEENT: normal. Neck: Supple, no JVD, carotid bruits, or masses. Cardiac: RRR, no murmurs, rubs, or gallops. No clubbing, cyanosis, edema.  Radials/DP/PT 2+ and equal bilaterally.  Respiratory:  Respirations regular and unlabored, clear to auscultation bilaterally. GI: Soft, nontender, nondistended, BS + x 4. MS: no deformity or atrophy. Skin: warm and dry, no rash. Neuro:  Strength and sensation are intact. Psych: Normal affect.  Accessory Clinical Findings  ECG - regular sinus rhythm/sinus arrhythmia, 73, nonspecific T changes-no acute ST or T changes. Assessment & Plan  1.  Moderate to large pericardial effusion: Patient is status post recent hospitalization secondary to pleuritic chest pain and dyspnea with finding of moderate to large pericardial effusion which had  increased in size from 4 days prior. He is feeling much better on high-dose ibuprofen and colchicine therapy and is tolerating these therapies well. He is also on PPI therapy for GI protection. He has not had any chest pain or dyspnea. Limited echo today shows a trivial to small effusion. Plan is to continue ibuprofen and colchicine therapy for a total of 4 weeks and will have him follow-up in the office at that time. He will contact us for any recurrence of chest pain or dyspnea.  2. Hypomagnesemia: This was noted during his hospitalization. We will follow-up a basic metabolic panel and magnesium today.   3. Disposition: Follow-up with Dr. Acie Fredrickson if 4 wks or sooner if necessary.  Murray Hodgkins, NP 08/28/2014, 10:43 AM

## 2014-08-28 NOTE — Patient Instructions (Signed)
Medication Instructions:  Your physician recommends that you continue on your current medications as directed. Please refer to the Current Medication list given to you today.  Labwork: Your physician recommends that you have lab work today BMET and Mg.   Testing/Procedures: NONE  Follow-Up: Your physician recommends that you schedule a follow-up appointment in: 4 weeks with Dr. Acie Fredrickson and if not see [name] NP.   Any Other Special Instructions Will Be Listed Below (If Applicable).

## 2014-08-28 NOTE — Telephone Encounter (Signed)
This is a patient of Dr. Harl Bowie

## 2014-09-01 ENCOUNTER — Telehealth: Payer: Self-pay | Admitting: Cardiology

## 2014-09-01 ENCOUNTER — Other Ambulatory Visit: Payer: Self-pay | Admitting: *Deleted

## 2014-09-01 ENCOUNTER — Telehealth: Payer: Self-pay | Admitting: Nurse Practitioner

## 2014-09-01 MED ORDER — IBUPROFEN 600 MG PO TABS: 600.0000 mg | ORAL_TABLET | Freq: Three times a day (TID) | ORAL | Status: DC

## 2014-09-01 NOTE — Telephone Encounter (Signed)
Pt rtn Pam's call from last week, pls call

## 2014-09-01 NOTE — Telephone Encounter (Signed)
Talked with patient.  He said he got everything straightened out.  He has been referred to Dr. Cathie Olden.

## 2014-09-01 NOTE — Telephone Encounter (Signed)
Rx(s) sent to pharmacy electronically. Refilled per previous refill documentation in chart

## 2014-09-01 NOTE — Telephone Encounter (Signed)
Returned call to patient. Left VM.

## 2014-09-02 ENCOUNTER — Other Ambulatory Visit: Payer: Self-pay

## 2014-09-02 DIAGNOSIS — R790 Abnormal level of blood mineral: Secondary | ICD-10-CM

## 2014-09-02 MED ORDER — MAGNESIUM OXIDE 400 (241.3 MG) MG PO TABS: 400.0000 mg | ORAL_TABLET | Freq: Three times a day (TID) | ORAL | Status: AC

## 2014-09-11 ENCOUNTER — Other Ambulatory Visit: Payer: Medicare Other

## 2014-09-14 ENCOUNTER — Other Ambulatory Visit (INDEPENDENT_AMBULATORY_CARE_PROVIDER_SITE_OTHER): Payer: Medicare Other | Admitting: *Deleted

## 2014-09-14 ENCOUNTER — Telehealth: Payer: Self-pay | Admitting: Cardiovascular Disease

## 2014-09-14 DIAGNOSIS — R790 Abnormal level of blood mineral: Secondary | ICD-10-CM

## 2014-09-14 LAB — MAGNESIUM: Magnesium: 1.3 mg/dL — ABNORMAL LOW (ref 1.5–2.5)

## 2014-09-14 NOTE — Telephone Encounter (Signed)
Lab results done today indicate magnesium level of 1.3. Pt confirmed that he has been taking magnesium oxide 400mg  tid since 09/02/2014. I reviewed with Dr Harrington Challenger -per Dr Harrington Challenger lab stable, review with primary cardiologist, Dr Acie Fredrickson tomorrow.  Pt advised that I will review with Dr Acie Fredrickson tomorrow and follow up with him.  Pt did state that he had been having diarrhea 1-2 times a day for about the last 3 weeks. Pt states diarrhea started before he started taking magnesium oxide 400mg  tid.

## 2014-09-14 NOTE — Telephone Encounter (Signed)
New message     Pt returning nurses call. Please call to discuss.

## 2014-09-14 NOTE — Telephone Encounter (Signed)
Routing to Desiree Lucy, RN for follow-up on patient's lab work

## 2014-09-14 NOTE — Telephone Encounter (Signed)
No answer

## 2014-09-15 ENCOUNTER — Other Ambulatory Visit: Payer: Self-pay | Admitting: Cardiology

## 2014-09-15 NOTE — Telephone Encounter (Signed)
I do not see that I have seen this patient previously   He is not on tikosyn or any other antiarrhythmic that would require [place] to monitor his magnesium   This is not a cardiac issue.  He should consult with his medical doctor .

## 2014-09-15 NOTE — Telephone Encounter (Signed)
Pt advised, verbalized understanding. Pt states his medical doctor is Dr [name] in [place]. Pt advised I will forward a copy of his lab results to Dr Sheryn Bison, pt advised to contact Dr Sheryn Bison in the next day or so if he does not hear from him.

## 2014-09-22 DIAGNOSIS — Z86711 Personal history of pulmonary embolism: Secondary | ICD-10-CM | POA: Diagnosis not present

## 2014-09-22 DIAGNOSIS — E780 Pure hypercholesterolemia: Secondary | ICD-10-CM | POA: Diagnosis not present

## 2014-09-22 DIAGNOSIS — G4733 Obstructive sleep apnea (adult) (pediatric): Secondary | ICD-10-CM | POA: Diagnosis not present

## 2014-09-22 DIAGNOSIS — R739 Hyperglycemia, unspecified: Secondary | ICD-10-CM | POA: Diagnosis not present

## 2014-09-22 DIAGNOSIS — Z8611 Personal history of tuberculosis: Secondary | ICD-10-CM | POA: Diagnosis not present

## 2014-09-22 DIAGNOSIS — Z79899 Other long term (current) drug therapy: Secondary | ICD-10-CM | POA: Diagnosis not present

## 2014-09-22 DIAGNOSIS — M5030 Other cervical disc degeneration, unspecified cervical region: Secondary | ICD-10-CM | POA: Diagnosis not present

## 2014-09-22 DIAGNOSIS — N400 Enlarged prostate without lower urinary tract symptoms: Secondary | ICD-10-CM | POA: Diagnosis not present

## 2014-09-22 DIAGNOSIS — I10 Essential (primary) hypertension: Secondary | ICD-10-CM | POA: Diagnosis not present

## 2014-09-22 DIAGNOSIS — M329 Systemic lupus erythematosus, unspecified: Secondary | ICD-10-CM | POA: Diagnosis not present

## 2014-09-22 DIAGNOSIS — Z8719 Personal history of other diseases of the digestive system: Secondary | ICD-10-CM | POA: Diagnosis not present

## 2014-09-30 ENCOUNTER — Ambulatory Visit (INDEPENDENT_AMBULATORY_CARE_PROVIDER_SITE_OTHER): Payer: Medicare Other | Admitting: Nurse Practitioner

## 2014-09-30 ENCOUNTER — Ambulatory Visit: Payer: Medicare Other | Admitting: Cardiovascular Disease

## 2014-09-30 ENCOUNTER — Encounter: Payer: Self-pay | Admitting: Nurse Practitioner

## 2014-09-30 VITALS — BP 160/98 | HR 68 | Ht 69.0 in | Wt 217.0 lb

## 2014-09-30 DIAGNOSIS — I10 Essential (primary) hypertension: Secondary | ICD-10-CM

## 2014-09-30 DIAGNOSIS — I319 Disease of pericardium, unspecified: Secondary | ICD-10-CM

## 2014-09-30 DIAGNOSIS — I313 Pericardial effusion (noninflammatory): Secondary | ICD-10-CM

## 2014-09-30 DIAGNOSIS — I3139 Other pericardial effusion (noninflammatory): Secondary | ICD-10-CM

## 2014-09-30 NOTE — Patient Instructions (Signed)
Medication Instructions:  Your physician recommends that you continue on your current medications as directed. Please refer to the Current Medication list given to you today.   Labwork: None   Testing/Procedures: Your physician has requested that you have an echocardiogram. Echocardiography is a painless test that uses sound waves to create images of your heart. It provides your doctor with information about the size and shape of your heart and how well your heart's chambers and valves are working. This procedure takes approximately one hour. There are no restrictions for this procedure.   Follow-Up: Your physician recommends that you schedule a follow-up appointment in: 3 months with Dr.Nahser   Any Other Special Instructions Will Be Listed Below (If Applicable).

## 2014-09-30 NOTE — Progress Notes (Signed)
Patient Name: Tyler Norris Date of Encounter: 09/30/2014  Primary Care Provider:  Cammy Copa, MD Primary Cardiologist:  Joaquim Nam, MD   Chief Complaint   80 year old male with a history of pericardial effusion who presents for follow-up.  Past Medical History   Past Medical History  Diagnosis Date  . Pericardial effusion     a. 08/22/2014 Echo: EF 60-65%, mildly dil Ao root (58mm), mildly dil LA, mod-large partially organized pericardial effusion with possible early tamponade - asymptomatic on high dose nsaids-->conservative mgmt;  b. 08/2014 Limited Echo: EF 55-60%, Triv - small effusion.  . Hyperlipidemia   . GERD (gastroesophageal reflux disease)   . Carpal tunnel syndrome, bilateral   . Sleep apnea     a. uses cpap every night  . AAA (abdominal aortic aneurysm)     a. resolved by ct scan 09-2011  . Arthritis   . Anemia   . Barrett's esophagus   . Essential hypertension   . History of pneumonia   . Lupus (systemic lupus erythematosus)   . Nephrolithiasis    Past Surgical History  Procedure Laterality Date  . Appendectomy    . Hernia repair  1966  . Hand surgery      scar repaired lt hand  . Tonsillectomy    . Cystoscopy w/ stone manipulation  2001,2009  . Carpal tunnel release  10/12/2011    Procedure: CARPAL TUNNEL RELEASE;  Surgeon: Cammie Sickle., MD;  Location: [facility];  Service: Orthopedics;  Laterality: Left;  . Carpal tunnel release  12/01/2011    Procedure: CARPAL TUNNEL RELEASE;  Surgeon: Cammie Sickle., MD;  Location: [facility];  Service: Orthopedics;  Laterality: Right;  . Knee arthroscopy Left     Allergies  Allergies  Allergen Reactions  . Lipitor [Atorvastatin Calcium]     Myalgias    HPI  80 year old male with the above complex problem list. He has a history of lupus and is followed closely by rheumatology. He was seen in the emergency room on June 21 with complaints of pleuritic chest  pain. CT of the chest was negative for PE or dissection but did show pericardial effusion. An echo was done showing a mild to moderate effusion without evidence of tamponade. A course of non-steroidal anti-inflammatory drugs was recommended and patient was seen by CT surgery without need for surgical intervention. He was discharged from the ED. Following discharge from the ER, he was only taking ibuprofen as needed and thus was likely underdosed. He presented back to the emergency department on June 25 with dyspnea and pleuritic chest pain. Repeat echo showed a moderate to large effusion with possible early tamponade. He was placed on colchicine and high-dose ibuprofen with improvement in her chest pain and dyspnea. He was seen by CT surgery and as he was hemodynamically stable, he was not felt to clinically have any signs or symptoms of cardiac tamponade. There was discussion about possible benefit of placement of a pericardial window however it was ultimately felt that a trial of medical therapy was warranted as he previously was noncompliant with NSAIDs.   when I last saw him in clinic on July 1 , he is doing well. Limited echo at that time showed trivial to small effusion. Since then, he has continued to do well. He is  Exercising 3 times per week without significant chest pain or dyspnea. He denies PND, orthopnea, dizziness, syncope, edema, or early satiety. He remains on colchicine  and ibuprofen therapy as well as PPI.  Home Medications  Prior to Admission medications   Medication Sig Start Date End Date Taking? Authorizing Provider  aspirin 81 MG tablet Take 81 mg by mouth daily.   Yes Historical Provider, MD  Bioflavonoid Products (BIOFLEX PO) Take 2 tablets by mouth daily.    Yes Historical Provider, MD  calcium-vitamin D (OSCAL WITH D) 500-200 MG-UNIT per tablet Take 1 tablet by mouth daily with breakfast.    Yes Historical Provider, MD  Cholecalciferol (VITAMIN D3) 5000 UNITS CAPS Take 5,000  Units by mouth daily.   Yes Historical Provider, MD  colchicine 0.6 MG tablet Take 1 tablet (0.6 mg total) by mouth 2 (two) times daily. 08/24/14  Yes [name], NP  esomeprazole (NEXIUM) 40 MG capsule Take 40 mg by mouth daily at 12 noon.   Yes Historical Provider, MD  fenofibrate (TRICOR) 145 MG tablet Take 145 mg by mouth daily.   Yes Historical Provider, MD  ferrous sulfate 325 (65 FE) MG tablet Take 325 mg by mouth daily with breakfast.   Yes Historical Provider, MD  folic acid (FOLVITE) 836 MCG tablet Take 400 mcg by mouth daily.   Yes Historical Provider, MD  hydroxychloroquine (PLAQUENIL) 200 MG tablet Take 200 mg by mouth 2 (two) times daily.   Yes Historical Provider, MD  ibuprofen (ADVIL,MOTRIN) 600 MG tablet Take 1 tablet (600 mg total) by mouth 3 (three) times daily. 09/01/14  Yes [name], NP  magnesium oxide (MAG-OX) 400 (241.3 MG) MG tablet Take 1 tablet (400 mg total) by mouth 3 (three) times daily. 09/02/14  Yes Rogelia Mire, NP  Multiple Vitamin (MULTIVITAMIN WITH MINERALS) TABS Take 1 tablet by mouth daily.   Yes Historical Provider, MD  Omega-3 Fatty Acids (FISH OIL) 1200 MG CAPS Take 1,200 mg by mouth daily.    Yes Historical Provider, MD  rosuvastatin (CRESTOR) 5 MG tablet Take 2.5 mg by mouth daily.    Yes Historical Provider, MD  vitamin C (ASCORBIC ACID) 500 MG tablet Take 1,000 mg by mouth daily.    Yes Historical Provider, MD  vitamin E 400 UNIT capsule Take 400 Units by mouth daily.   Yes Historical Provider, MD    Review of Systems  Patient continues to do well.  He denies chest pain, palpitations, dyspnea, pnd, orthopnea, n, v, dizziness, syncope, edema, weight gain, or early satiety.  All other systems reviewed and are otherwise negative except as noted above.  Physical Exam  VS:  BP 160/98 mmHg  Pulse 68  Ht 5\' 9"  (1.753 m)  Wt 217 lb (98.431 kg)  BMI 32.03 kg/m2 , BMI Body mass index is 32.03 kg/(m^2). GEN: Well nourished, well developed, in  no acute distress. HEENT: normal. Neck: Supple, no JVD, carotid bruits, or masses. Cardiac: RRR, no murmurs, rubs, or gallops. No clubbing, cyanosis, edema.  Radials/DP/PT 2+ and equal bilaterally.  Respiratory:  Respirations regular and unlabored, clear to auscultation bilaterally. GI: Soft, nontender, nondistended, BS + x 4. MS: no deformity or atrophy. Skin: warm and dry, no rash. Neuro:  Strength and sensation are intact. Psych: Normal affect.  Accessory Clinical Findings  F/U limited echo pending.  Assessment & Plan  1.   Pericardial effusion: patient was diagnosed with a moderate to large pericardial effusion in June and has been on ibuprofen and colchicine therapy since. Limited echo approximately one week after discharge showed a small trivial to small effusion. He has since done well without  chest pain or dyspnea. He has no rub on exam. As previously planned, we will follow-up a limited echo provided that effusion is stable, we will plan to discontinue ibuprofen and colchicine.   2. Essential hypertension: Blood pressure elevated today. He notes it runs better at home. He is not currently on any antihypertensives. He will continue to follow at home.  3.  Disposition:  F/u limited echo.  If effusion stable, d/c ibuprofen/cochicine (it has been ~ 6 wks).   Murray Hodgkins, NP 09/30/2014, 12:49 PM

## 2014-10-26 ENCOUNTER — Other Ambulatory Visit: Payer: Self-pay | Admitting: *Deleted

## 2014-10-26 ENCOUNTER — Other Ambulatory Visit: Payer: Self-pay | Admitting: Cardiology

## 2014-10-26 NOTE — Telephone Encounter (Signed)
REFILL 

## 2014-11-04 ENCOUNTER — Other Ambulatory Visit: Payer: Self-pay

## 2014-11-04 ENCOUNTER — Other Ambulatory Visit: Payer: Self-pay | Admitting: Nurse Practitioner

## 2014-11-04 ENCOUNTER — Ambulatory Visit (HOSPITAL_COMMUNITY): Payer: Medicare Other | Attending: Cardiology

## 2014-11-04 DIAGNOSIS — I319 Disease of pericardium, unspecified: Secondary | ICD-10-CM | POA: Diagnosis not present

## 2014-11-04 DIAGNOSIS — I313 Pericardial effusion (noninflammatory): Secondary | ICD-10-CM | POA: Diagnosis not present

## 2014-11-04 DIAGNOSIS — I3139 Other pericardial effusion (noninflammatory): Secondary | ICD-10-CM

## 2014-11-04 DIAGNOSIS — E785 Hyperlipidemia, unspecified: Secondary | ICD-10-CM | POA: Insufficient documentation

## 2014-11-18 DIAGNOSIS — Z23 Encounter for immunization: Secondary | ICD-10-CM | POA: Diagnosis not present

## 2014-11-25 ENCOUNTER — Encounter: Payer: Self-pay | Admitting: Pulmonary Disease

## 2014-11-25 ENCOUNTER — Ambulatory Visit (INDEPENDENT_AMBULATORY_CARE_PROVIDER_SITE_OTHER): Payer: Medicare Other | Admitting: Pulmonary Disease

## 2014-11-25 VITALS — BP 160/86 | HR 65 | Temp 98.0°F | Ht 69.0 in | Wt 218.0 lb

## 2014-11-25 DIAGNOSIS — I3139 Other pericardial effusion (noninflammatory): Secondary | ICD-10-CM

## 2014-11-25 DIAGNOSIS — I10 Essential (primary) hypertension: Secondary | ICD-10-CM

## 2014-11-25 DIAGNOSIS — Z9989 Dependence on other enabling machines and devices: Secondary | ICD-10-CM

## 2014-11-25 DIAGNOSIS — I319 Disease of pericardium, unspecified: Secondary | ICD-10-CM | POA: Diagnosis not present

## 2014-11-25 DIAGNOSIS — G4733 Obstructive sleep apnea (adult) (pediatric): Secondary | ICD-10-CM | POA: Diagnosis not present

## 2014-11-25 DIAGNOSIS — I313 Pericardial effusion (noninflammatory): Secondary | ICD-10-CM

## 2014-11-25 NOTE — Patient Instructions (Signed)
Get back with Dr Estanislado Pandy for your lupus - this may be the reason you had fluid around your heart

## 2014-11-25 NOTE — Progress Notes (Signed)
   Subjective:    Patient ID: Tyler Norris, male    DOB: 04/26/1934, 80 y.o.   MRN: [id number]  HPI   80 year old never smoker with SLE diagnosed 2014 for FU of OSA .  Was on INH for Quantiferon test -positive ,  finished course from 12/2012 . Followed by Rhuematology for SLE, on Plaquenil . Off prednisone. Pleural effusions resolved. H/o pulmonary embolism 05/2012 - finished coumadin course  02/2013 .    11/25/2014  Chief Complaint  Patient presents with  . Follow-up    pt. states breathing is ok. at times a mild cough. wears cpap everynight about 6 hrs.    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   PFTs  - 04/2014 - unchanged, FEV1 79%, FVC 78% improves to >80% with BDs, TLC 68%, DLCO 55%  CPAP download 04/2014-shows excellent usage, no residual events, mild leak which is not significant  Wears CPAP every night  -avg 7 hr No naps, no daytime sleepiness.  Doing well with mask . No issues with leaks.  Uses nasal pillows.  Feels well rested .   Significant tests/ events   CT chest on 05/13/12 showed consolidation in left lower lobe and lingula with a small left parapneumonic effusion but no evidence of pulmonary embolism.  CT abdomen showed small nonobstructive bilateral renal calculi and sigmoid colon diverticulosis and a left adrenal nodule consistent with adenoma.  ANA was noted to be strongly positive at 1 in 1280 , RA factor was mildly positive at 18. Double-stranded DNA was positive, and levels were normal as was CCP antibody. ESR was 60 and anti-Smith antibody was negative  Thoracentesis 05/23/12 of 230 mL of monocytic exudative fluid.  CT angiogram on 06/05/12 showed small subsegmental pulmonary emboli in the right upper lobe, right middle  lobe and left lower lobe. A right effusion was noted somewhat loculated and a small left effusion and was layering.  Echo - normal systolic function with an EF of 55% and no evidence of diastolic dysfunction.  -venous duplex neg   Review of Systems neg for any significant sore throat, dysphagia, itching, sneezing, nasal congestion or excess/ purulent secretions, fever, chills, sweats, unintended wt loss, pleuritic or exertional cp, hempoptysis, orthopnea pnd or change in chronic leg swelling. Also denies presyncope, palpitations, heartburn, abdominal pain, nausea, vomiting, diarrhea or change in bowel or urinary habits, dysuria,hematuria, rash, arthralgias, visual complaints, headache, numbness weakness or ataxia.     Objective:   Physical Exam   Gen. Pleasant, well-nourished, in no distress ENT - no lesions, no post nasal drip Neck: No JVD, no thyromegaly, no carotid bruits Lungs: no use of accessory muscles, no dullness to percussion, clear without rales or rhonchi  Cardiovascular: Rhythm regular, heart sounds  normal, no murmurs or gallops, no peripheral edema Musculoskeletal: No deformities, no cyanosis or clubbing       Assessment & Plan:

## 2014-11-25 NOTE — Assessment & Plan Note (Signed)
He will recheck and follow-up with PCP

## 2014-11-25 NOTE — Assessment & Plan Note (Signed)
-  may be related to SLE Get back with Dr Estanislado Pandy

## 2014-11-25 NOTE — Assessment & Plan Note (Signed)
Compliant with CPAP 

## 2014-12-11 DIAGNOSIS — I10 Essential (primary) hypertension: Secondary | ICD-10-CM | POA: Diagnosis not present

## 2014-12-11 DIAGNOSIS — R0602 Shortness of breath: Secondary | ICD-10-CM | POA: Diagnosis not present

## 2014-12-11 DIAGNOSIS — M329 Systemic lupus erythematosus, unspecified: Secondary | ICD-10-CM | POA: Diagnosis not present

## 2014-12-11 DIAGNOSIS — Z09 Encounter for follow-up examination after completed treatment for conditions other than malignant neoplasm: Secondary | ICD-10-CM | POA: Diagnosis not present

## 2014-12-14 DIAGNOSIS — R5381 Other malaise: Secondary | ICD-10-CM | POA: Diagnosis not present

## 2014-12-14 DIAGNOSIS — Z79899 Other long term (current) drug therapy: Secondary | ICD-10-CM | POA: Diagnosis not present

## 2014-12-14 DIAGNOSIS — Z9225 Personal history of immunosupression therapy: Secondary | ICD-10-CM | POA: Diagnosis not present

## 2014-12-14 DIAGNOSIS — M2550 Pain in unspecified joint: Secondary | ICD-10-CM | POA: Diagnosis not present

## 2014-12-23 DIAGNOSIS — M329 Systemic lupus erythematosus, unspecified: Secondary | ICD-10-CM | POA: Diagnosis not present

## 2014-12-23 DIAGNOSIS — D3502 Benign neoplasm of left adrenal gland: Secondary | ICD-10-CM | POA: Diagnosis not present

## 2014-12-23 DIAGNOSIS — N400 Enlarged prostate without lower urinary tract symptoms: Secondary | ICD-10-CM | POA: Diagnosis not present

## 2014-12-23 DIAGNOSIS — Z8611 Personal history of tuberculosis: Secondary | ICD-10-CM | POA: Diagnosis not present

## 2014-12-23 DIAGNOSIS — N200 Calculus of kidney: Secondary | ICD-10-CM | POA: Diagnosis not present

## 2014-12-23 DIAGNOSIS — E785 Hyperlipidemia, unspecified: Secondary | ICD-10-CM | POA: Diagnosis not present

## 2014-12-23 DIAGNOSIS — G4733 Obstructive sleep apnea (adult) (pediatric): Secondary | ICD-10-CM | POA: Diagnosis not present

## 2014-12-23 DIAGNOSIS — E119 Type 2 diabetes mellitus without complications: Secondary | ICD-10-CM | POA: Diagnosis not present

## 2014-12-23 DIAGNOSIS — Z86711 Personal history of pulmonary embolism: Secondary | ICD-10-CM | POA: Diagnosis not present

## 2014-12-23 DIAGNOSIS — Z8719 Personal history of other diseases of the digestive system: Secondary | ICD-10-CM | POA: Diagnosis not present

## 2014-12-23 DIAGNOSIS — M5030 Other cervical disc degeneration, unspecified cervical region: Secondary | ICD-10-CM | POA: Diagnosis not present

## 2014-12-23 DIAGNOSIS — I10 Essential (primary) hypertension: Secondary | ICD-10-CM | POA: Diagnosis not present

## 2014-12-30 DIAGNOSIS — Z79899 Other long term (current) drug therapy: Secondary | ICD-10-CM | POA: Diagnosis not present

## 2014-12-30 DIAGNOSIS — R319 Hematuria, unspecified: Secondary | ICD-10-CM | POA: Diagnosis not present

## 2014-12-30 DIAGNOSIS — M329 Systemic lupus erythematosus, unspecified: Secondary | ICD-10-CM | POA: Diagnosis not present

## 2014-12-30 DIAGNOSIS — M19241 Secondary osteoarthritis, right hand: Secondary | ICD-10-CM | POA: Diagnosis not present

## 2014-12-31 ENCOUNTER — Encounter: Payer: Self-pay | Admitting: Cardiovascular Disease

## 2014-12-31 ENCOUNTER — Ambulatory Visit (INDEPENDENT_AMBULATORY_CARE_PROVIDER_SITE_OTHER): Payer: Medicare Other | Admitting: Cardiovascular Disease

## 2014-12-31 VITALS — BP 130/78 | HR 60 | Ht 69.0 in | Wt 217.1 lb

## 2014-12-31 DIAGNOSIS — I319 Disease of pericardium, unspecified: Secondary | ICD-10-CM

## 2014-12-31 DIAGNOSIS — I313 Pericardial effusion (noninflammatory): Secondary | ICD-10-CM

## 2014-12-31 DIAGNOSIS — I3139 Other pericardial effusion (noninflammatory): Secondary | ICD-10-CM

## 2014-12-31 NOTE — Progress Notes (Signed)
Patient Name: Tyler Norris Date of Encounter: 12/31/2014  Primary Care Provider:  Cammy Copa, MD Primary Cardiologist:  Joaquim Nam, MD   Chief Complaint  80 year old male who presents for follow-up related to recent hospitalization for moderate to large pericardial effusion.   80 year old male with the above complex problem list. He has a history of lupus and is followed closely by rheumatology. He was recently seen in the emergency room on June 21 with complaints of pleuritic chest pain. CT of the chest was negative for PE or dissection but did show pericardial effusion. An echo was done showing a mild to moderate effusion without evidence of tamponade. A course of non-steroidal anti-inflammatory drugs was recommended and patient was seen by CT surgery without need for surgical intervention. He was discharged from the ED. Following discharge from the, he was only taking ibuprofen as needed and thus was likely underdosed. He presented back to the emergency department on June 25 with dyspnea and pleuritic chest pain. Repeat echo showed a moderate to large effusion with possible early tamponade not. He was placed on colchicine and high-dose ibuprofen with improvement in her chest pain and dyspnea. He was seen by CT surgery and as he was hemodynamically stable, he was not felt to clinically have any signs or symptoms of cardiac tamponade. There was discussion about possible benefit of placement of a pericardial window however it was ultimately felt that a trial of medical therapy was warranted as he previously was noncompliant with NSAIDs.  Since discharge, he has been taking ibuprofen 60 mg 3 times a day and also colchicine 0.6 mg twice a day. He is feeling much better and has not had any recurrence of pleuritic chest pain or dyspnea. He is also taking Nexium for GI protection. He denies PND, orthopnea, dizziness, syncope, or early satiety. He does have some degree of mild chronic lower  extremity edema.  Nov. 3, 2016:  Mr. Tyler Norris is seen back .   - has seen Dr. Radford Pax during a recent hospitalization but was assignied to see me .  Has seen Tyler Norris in the office following his hospitalization.   Feeling well   Breathing well Has seen his rheumatolotist. Has some depression issues.   Exercises regularly  Has been an air traffic controller in the past.   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  Still taking colchicine    Past Medical History   Past Medical History  Diagnosis Date  . Pericardial effusion     a. 08/22/2014 Echo: EF 60-65%, mildly dil Ao root (13mm), mildly dil LA, mod-large partially organized pericardial effusion with possible early tamponade - asymptomatic on high dose nsaids-->conservative mgmt;  b. 08/2014 Limited Echo: EF 55-60%, Triv - small effusion.  . Hyperlipidemia   . GERD (gastroesophageal reflux disease)   . Carpal tunnel syndrome, bilateral   . Sleep apnea     a. uses cpap every night  . AAA (abdominal aortic aneurysm) ([place])     a. resolved by ct scan 09-2011  . Arthritis   . Anemia   . Barrett's esophagus   . Essential hypertension   . History of pneumonia   . Lupus (systemic lupus erythematosus) ([facility])   . Nephrolithiasis    Past Surgical History  Procedure Laterality Date  . Appendectomy    . Hernia repair  1966  . Hand surgery      scar repaired lt hand  . Tonsillectomy    .  Cystoscopy w/ stone manipulation  2001,2009  . Carpal tunnel release  10/12/2011    Procedure: CARPAL TUNNEL RELEASE;  Surgeon: Cammie Sickle., MD;  Location: [facility];  Service: Orthopedics;  Laterality: Left;  . Carpal tunnel release  12/01/2011    Procedure: CARPAL TUNNEL RELEASE;  Surgeon: Cammie Sickle., MD;  Location: [facility];  Service: Orthopedics;  Laterality: Right;  . Knee arthroscopy Left     Allergies  Allergies  Allergen Reactions  . Lipitor [Atorvastatin  Calcium]     Myalgias    HPI  .   Home Medications  Prior to Admission medications   Medication Sig Start Date End Date Taking? Authorizing Provider  aspirin 81 MG tablet Take 81 mg by mouth daily.    Historical Provider, MD  Bioflavonoid Products (BIOFLEX PO) Take 2 tablets by mouth daily.     Historical Provider, MD  calcium-vitamin D (OSCAL WITH D) 500-200 MG-UNIT per tablet Take 1 tablet by mouth daily with breakfast.     Historical Provider, MD  colchicine 0.6 MG tablet Take 1 tablet (0.6 mg total) by mouth 2 (two) times daily. 08/24/14   Tyler Serge, NP  esomeprazole (NEXIUM) 40 MG capsule Take 40 mg by mouth daily at 12 noon.    Historical Provider, MD  fenofibrate (TRICOR) 145 MG tablet Take 145 mg by mouth daily.    Historical Provider, MD  ferrous sulfate 325 (65 FE) MG tablet Take 325 mg by mouth daily with breakfast.    Historical Provider, MD  folic acid (FOLVITE) 454 MCG tablet Take 400 mcg by mouth daily.    Historical Provider, MD  hydroxychloroquine (PLAQUENIL) 200 MG tablet Take 200 mg by mouth 2 (two) times daily.    Historical Provider, MD  ibuprofen (ADVIL,MOTRIN) 600 MG tablet Take 1 tablet (600 mg total) by mouth 3 (three) times daily. 08/24/14   Tyler Serge, NP  magnesium oxide (MAG-OX) 400 (241.3 MG) MG tablet Take 1 tablet (400 mg total) by mouth 2 (two) times daily. 08/24/14   Tyler Serge, NP  Multiple Vitamin (MULTIVITAMIN WITH MINERALS) TABS Take 1 tablet by mouth daily.    Historical Provider, MD  Omega-3 Fatty Acids (FISH OIL) 1200 MG CAPS Take 1,200 mg by mouth daily.     Historical Provider, MD  rosuvastatin (CRESTOR) 5 MG tablet Take 2.5 mg by mouth daily.     Historical Provider, MD  vitamin C (ASCORBIC ACID) 500 MG tablet Take 1,000 mg by mouth daily.     Historical Provider, MD  Vitamin D, Ergocalciferol, (DRISDOL) 50000 UNITS CAPS capsule Take 50,000 Units by mouth every 7 (seven) days.    Historical Provider, MD  vitamin E 400 UNIT capsule Take  400 Units by mouth daily.    Historical Provider, MD    Review of Systems  Doing much better on non-steroidal and colchicine therapy without chest pain, dyspnea, PND, orthopnea, dizziness, syncope, or early satiety. He does have some degree of bilateral chronic lower extremity swelling which is unchanged.  All other systems reviewed and are otherwise negative except as noted above.  Physical Exam  VS:  BP 130/78 mmHg  Pulse 60  Ht 5\' 9"  (1.753 m)  Wt 217 lb 1.9 oz (98.485 kg)  BMI 32.05 kg/m2  SpO2 96% , BMI Body mass index is 32.05 kg/(m^2). GEN: Well nourished, well developed, in no acute distress. HEENT: normal. Neck: Supple, no JVD, carotid bruits, or masses. Cardiac:  RRR, no murmurs, rubs, or gallops. No clubbing, cyanosis, edema.  Radials/DP/PT 2+ and equal bilaterally.  Respiratory:  Respirations regular and unlabored, clear to auscultation bilaterally. GI: Soft, nontender, nondistended, BS + x 4. MS: no deformity or atrophy. Skin: warm and dry, no rash. Neuro:  Strength and sensation are intact. Psych: Normal affect.  Accessory Clinical Findings  ECG - regular sinus rhythm/sinus arrhythmia, 73, nonspecific T changes-no acute ST or T changes. Assessment & Plan  1.  Moderate to large pericardial effusion:   Has completely resolved by echo in Sept. Will DC colchicine and scheduled Ibuprofen at this point  He sees his rheumatologist and medical doctor regularly. We will see him on an as needed basis .    Dwon Sky, Wonda Cheng, MD  12/31/2014 9:01 AM    [address] [place],  [place], [place]  [address] Pager [phone] Phone: [phone]; Fax: [phone]   [facility]  [address] [place], [place]  [address] [phone]   Fax [phone]

## 2014-12-31 NOTE — Patient Instructions (Signed)
Medication Instructions:  STOP Colchicine   Labwork: None Ordered   Testing/Procedures: None Ordered   Follow-Up: Your physician recommends that you schedule a follow-up appointment in: as needed with Dr. Acie Fredrickson.    If you need a refill on your cardiac medications before your next appointment, please call your pharmacy.   Thank you for choosing [facility]! Christen Bame, RN [phone]

## 2015-01-11 ENCOUNTER — Ambulatory Visit: Payer: Medicare Other | Admitting: Pulmonary Disease

## 2015-01-11 ENCOUNTER — Telehealth: Payer: Self-pay | Admitting: Pulmonary Disease

## 2015-01-11 NOTE — Telephone Encounter (Signed)
Called and spoke to pt. Pt c/o increase in SOB and is requesting an appt. Offered appt with VS today (01/11/2015), pt declined due to needing to take his wife to an appt. Appt made with MW on 01/12/15. Pt verbalized understanding and denied any further questions or concerns at this time.

## 2015-01-12 ENCOUNTER — Ambulatory Visit (INDEPENDENT_AMBULATORY_CARE_PROVIDER_SITE_OTHER)
Admission: RE | Admit: 2015-01-12 | Discharge: 2015-01-12 | Disposition: A | Discharge status: Home / Self Care | Payer: Medicare Other | Source: Ambulatory Visit | Attending: Internal Medicine | Admitting: Internal Medicine

## 2015-01-12 ENCOUNTER — Encounter: Payer: Self-pay | Admitting: Internal Medicine

## 2015-01-12 ENCOUNTER — Ambulatory Visit (INDEPENDENT_AMBULATORY_CARE_PROVIDER_SITE_OTHER): Payer: Medicare Other | Admitting: Internal Medicine

## 2015-01-12 DIAGNOSIS — M329 Systemic lupus erythematosus, unspecified: Secondary | ICD-10-CM

## 2015-01-12 DIAGNOSIS — R079 Chest pain, unspecified: Secondary | ICD-10-CM | POA: Diagnosis not present

## 2015-01-12 NOTE — Progress Notes (Signed)
Patient ID: SHIVEN JUNIOUS, male    DOB: 1934/08/29,     MRN: [id number]  HPI   80 year old never smoker with SLE diagnosed 2014 for FU of OSA .  Was on INH for Quantiferon test -positive ,  finished course from 12/2012 . Followed by Rhuematology for SLE, on Plaquenil . Off prednisone. Pleural effusions resolved. H/o pulmonary embolism 05/2012 - finished coumadin course  02/2013 .    11/25/2014  Chief Complaint  Patient presents with  . Follow-up    pt. states breathing is ok. at times a mild cough. wears cpap everynight about 6 hrs.    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   PFTs  - 04/2014 - unchanged, FEV1 79%, FVC 78% improves to >80% with BDs, TLC 68%, DLCO 55%  CPAP download 04/2014-shows excellent usage, no residual events, mild leak which is not significant  Wears CPAP every night  -avg 7 hr No naps, no daytime sleepiness.  Doing well with mask . No issues with leaks.  Uses nasal pillows.  Feels well rested  rec Get back with Dr DEstanislado Pandyfor your lupus - this may be the reason you had fluid around your heart   01/12/2015  Acute ov/Iria Jamerson re: recurrent pleuritic cp in pt with lupus/ pleuritis and pericarditis  Chief Complaint  Patient presents with  . Acute Visit    Pt c/o pain in his lower right side- worse when he takes a deep breath for the past 6 wks.     Body mass index is 31.25 kg/(m^2).    Onset x 6 weeks worse since stopped ibuprofen / colchicine more joint pain also / same pattern as prior to taking nsaids No more sob than usual. No worse when lie down  No cough or fever   No obvious day to day or daytime variability or assoc chronic cough or cp or chest tightness, subjective wheeze or overt sinus or hb symptoms. No  unusual exp hx or h/o childhood pna/ asthma or knowledge of premature birth.  Sleeping ok without nocturnal  or early am exacerbation  of respiratory  c/o's or need for noct saba. Also denies any obvious fluctuation of symptoms with weather or environmental changes or other aggravating or alleviating factors except as outlined above   Current Medications, Allergies, Complete Past Medical History, Past Surgical History, Family History, and Social History were reviewed in [employer].  ROS  The following are not active complaints unless bolded sore throat, dysphagia, dental problems, itching, sneezing,  nasal congestion or excess/ purulent secretions, ear ache,   fever, chills, sweats, unintended wt loss, classically  exertional cp, hemoptysis,  orthopnea pnd or leg swelling, presyncope, palpitations, abdominal pain, anorexia, nausea, vomiting, diarrhea  or change in bowel or bladder habits, change in stools or urine, dysuria,hematuria,  rash, arthralgias, visual complaints, headache, numbness, weakness or ataxia or problems with walking or coordination,  change in mood/affect or memory.          Significant tests/ events   CT chest on 05/13/12 showed consolidation in left lower lobe and lingula with a small left parapneumonic effusion but no evidence of pulmonary embolism.  CT abdomen showed small nonobstructive bilateral renal calculi and sigmoid colon diverticulosis  and a left adrenal nodule consistent with adenoma.  ANA was noted to be strongly positive at 1 in 1280 , RA factor was mildly positive at 18. Double-stranded DNA was positive, and levels were normal as was CCP antibody. ESR was 60 and anti-Smith antibody was negative  Thoracentesis 05/23/12 of 230 mL of monocytic exudative fluid.  CT angiogram on 06/05/12 showed small subsegmental pulmonary emboli in the right upper lobe, right middle lobe and left lower lobe. A right effusion was noted somewhat loculated and  a small left effusion and was layering.  Echo - normal systolic function with an EF of 55% and no evidence of diastolic dysfunction.  -venous duplex neg   PEx  amb wm nad  Wt Readings from Last 3 Encounters:  01/12/15 214 lb 9.6 oz (97.342 kg)  12/31/14 217 lb 1.9 oz (98.485 kg)  11/25/14 218 lb (98.884 kg)    Vital signs reviewed  HEENT: nl dentition, turbinates, and oropharynx. Nl external ear canals without cough reflex   NECK :  without JVD/Nodes/TM/ nl carotid upstrokes bilaterally   LUNGS: no acc muscle use, clear to A and P bilaterally without cough on insp or exp maneuvers   CV:  RRR  no s3 or murmur or increase in P2, no edema   ABD:  soft and nontender with nl excursion in the supine position. No bruits or organomegaly, bowel sounds nl  MS:  warm without deformities, calf tenderness, cyanosis or clubbing  SKIN: warm and dry without lesions    NEURO:  alert, approp, no deficits     CXR PA and Lateral:   01/12/2015 :    I personally reviewed images and agree with radiology impression as follows:    Persistent enlargement of the cardiac silhouette without pulmonary vascular congestion or interstitial edema. The possibility of a recurrent pericardial effusion is raised. Echocardiography would be useful if this has not already been performed. There is no pneumonia nor pleural effusion.

## 2015-01-12 NOTE — Progress Notes (Signed)
Quick Note:  LMTCB ______ 

## 2015-01-12 NOTE — Patient Instructions (Addendum)
Ibuprofen 200 mg 2-3 with meals (not exceed 8 per day ) and let us know if not improving over the next few weeks or any new chest pains  Please remember to go to the  x-ray department downstairs for your tests - we will call you with the results when they are available.  Follow up per Dr Elsworth Soho

## 2015-01-13 NOTE — Progress Notes (Signed)
Quick Note:  Spoke with pt and notified of results per Dr. Wert. Pt verbalized understanding and denied any questions.  ______ 

## 2015-01-17 ENCOUNTER — Encounter: Payer: Self-pay | Admitting: Internal Medicine

## 2015-01-17 NOTE — Assessment & Plan Note (Addendum)
He appears to be having a mild flare typical for him of a flare of lupus  pleuritis despite maintenance therapy with Plaquenil under rheumatology's direction. For some reason he understood he should stop the Advil completely but I think be more appropriate to use it on a prn basis up to 600 mg 3 times a day with meals pending planned follow-up with rheumatology.  I had an extended discussion with the patient reviewing all relevant studies completed to date and  lasting 15 to 20 minutes of a 25 minute visit    Each maintenance medication was reviewed in detail including most importantly the difference between maintenance and prns and under what circumstances the prns are to be triggered using an action plan format that is not reflected in the computer generated alphabetically organized AVS.    Please see instructions for details which were reviewed in writing and the patient given a copy highlighting the part that I personally wrote and discussed at today's ov.

## 2015-03-17 DIAGNOSIS — M3210 Systemic lupus erythematosus, organ or system involvement unspecified: Secondary | ICD-10-CM | POA: Diagnosis not present

## 2015-03-17 DIAGNOSIS — M1712 Unilateral primary osteoarthritis, left knee: Secondary | ICD-10-CM | POA: Diagnosis not present

## 2015-03-17 DIAGNOSIS — Z79899 Other long term (current) drug therapy: Secondary | ICD-10-CM | POA: Diagnosis not present

## 2015-03-25 DIAGNOSIS — Z79899 Other long term (current) drug therapy: Secondary | ICD-10-CM | POA: Diagnosis not present

## 2015-04-27 DIAGNOSIS — E785 Hyperlipidemia, unspecified: Secondary | ICD-10-CM | POA: Diagnosis not present

## 2015-04-27 DIAGNOSIS — M329 Systemic lupus erythematosus, unspecified: Secondary | ICD-10-CM | POA: Diagnosis not present

## 2015-04-27 DIAGNOSIS — E7800 Pure hypercholesterolemia, unspecified: Secondary | ICD-10-CM | POA: Diagnosis not present

## 2015-04-27 DIAGNOSIS — I10 Essential (primary) hypertension: Secondary | ICD-10-CM | POA: Diagnosis not present

## 2015-04-27 DIAGNOSIS — E119 Type 2 diabetes mellitus without complications: Secondary | ICD-10-CM | POA: Diagnosis not present

## 2015-04-27 DIAGNOSIS — E530 Riboflavin deficiency: Secondary | ICD-10-CM | POA: Diagnosis not present

## 2015-05-26 ENCOUNTER — Ambulatory Visit (INDEPENDENT_AMBULATORY_CARE_PROVIDER_SITE_OTHER): Payer: Medicare Other | Admitting: Adult Health

## 2015-05-26 ENCOUNTER — Other Ambulatory Visit (INDEPENDENT_AMBULATORY_CARE_PROVIDER_SITE_OTHER): Payer: Medicare Other

## 2015-05-26 ENCOUNTER — Ambulatory Visit (INDEPENDENT_AMBULATORY_CARE_PROVIDER_SITE_OTHER)
Admission: RE | Admit: 2015-05-26 | Discharge: 2015-05-26 | Disposition: A | Discharge status: Home / Self Care | Payer: Medicare Other | Source: Ambulatory Visit | Attending: Adult Health | Admitting: Adult Health

## 2015-05-26 ENCOUNTER — Encounter: Payer: Self-pay | Admitting: Adult Health

## 2015-05-26 ENCOUNTER — Other Ambulatory Visit: Payer: Self-pay | Admitting: Adult Health

## 2015-05-26 VITALS — BP 148/80 | HR 84 | Temp 99.0°F | Ht 69.0 in | Wt 215.0 lb

## 2015-05-26 DIAGNOSIS — T887XXS Unspecified adverse effect of drug or medicament, sequela: Secondary | ICD-10-CM | POA: Diagnosis not present

## 2015-05-26 DIAGNOSIS — R0602 Shortness of breath: Secondary | ICD-10-CM

## 2015-05-26 DIAGNOSIS — J90 Pleural effusion, not elsewhere classified: Secondary | ICD-10-CM

## 2015-05-26 DIAGNOSIS — G4733 Obstructive sleep apnea (adult) (pediatric): Secondary | ICD-10-CM

## 2015-05-26 DIAGNOSIS — E612 Magnesium deficiency: Secondary | ICD-10-CM | POA: Diagnosis not present

## 2015-05-26 DIAGNOSIS — J690 Pneumonitis due to inhalation of food and vomit: Secondary | ICD-10-CM | POA: Diagnosis not present

## 2015-05-26 DIAGNOSIS — R0600 Dyspnea, unspecified: Secondary | ICD-10-CM | POA: Diagnosis not present

## 2015-05-26 DIAGNOSIS — I319 Disease of pericardium, unspecified: Secondary | ICD-10-CM | POA: Diagnosis not present

## 2015-05-26 DIAGNOSIS — I313 Pericardial effusion (noninflammatory): Secondary | ICD-10-CM

## 2015-05-26 DIAGNOSIS — I3139 Other pericardial effusion (noninflammatory): Secondary | ICD-10-CM

## 2015-05-26 DIAGNOSIS — Z9989 Dependence on other enabling machines and devices: Secondary | ICD-10-CM

## 2015-05-26 LAB — CBC WITH DIFFERENTIAL/PLATELET
BASOS PCT: 0.4 % (ref 0.0–3.0)
Basophils Absolute: 0 10*3/uL (ref 0.0–0.1)
EOS PCT: 0.6 % (ref 0.0–5.0)
Eosinophils Absolute: 0.1 10*3/uL (ref 0.0–0.7)
HCT: 37 % — ABNORMAL LOW (ref 39.0–52.0)
Hemoglobin: 12.6 g/dL — ABNORMAL LOW (ref 13.0–17.0)
LYMPHS ABS: 1.3 10*3/uL (ref 0.7–4.0)
Lymphocytes Relative: 14.1 % (ref 12.0–46.0)
MCHC: 34.2 g/dL (ref 30.0–36.0)
MCV: 88 fl (ref 78.0–100.0)
MONO ABS: 1 10*3/uL (ref 0.1–1.0)
MONOS PCT: 11 % (ref 3.0–12.0)
NEUTROS ABS: 6.8 10*3/uL (ref 1.4–7.7)
NEUTROS PCT: 73.9 % (ref 43.0–77.0)
Platelets: 268 10*3/uL (ref 150.0–400.0)
RBC: 4.2 Mil/uL — AB (ref 4.22–5.81)
RDW: 13 % (ref 11.5–15.5)
WBC: 9.3 10*3/uL (ref 4.0–10.5)

## 2015-05-26 LAB — BASIC METABOLIC PANEL WITH GFR
BUN: 16 mg/dL (ref 7–25)
CHLORIDE: 104 mmol/L (ref 98–110)
CO2: 25 mmol/L (ref 20–31)
CREATININE: 0.89 mg/dL (ref 0.70–1.18)
Calcium: 9.6 mg/dL (ref 8.6–10.3)
GFR, Est African American: >89 mL/min (ref >=60)
GFR, Est Non African American: 82 mL/min (ref >=60)
GLUCOSE: 142 mg/dL — AB (ref 65–99)
POTASSIUM: 4.4 mmol/L (ref 3.5–5.3)
Sodium: 140 mmol/L (ref 135–146)

## 2015-05-26 LAB — MAGNESIUM: MAGNESIUM: 1.6 mg/dL (ref 1.5–2.5)

## 2015-05-26 LAB — BRAIN NATRIURETIC PEPTIDE: PRO B NATRI PEPTIDE: 62 pg/mL (ref 0.0–100.0)

## 2015-05-26 MED ORDER — LEVOFLOXACIN 500 MG PO TABS: 500.0000 mg | ORAL_TABLET | Freq: Every day | ORAL | Status: DC

## 2015-05-26 NOTE — Progress Notes (Signed)
Subjective:    Patient ID: Tyler Norris, male    DOB: 1934-10-29, 81 y.o.   MRN: [id number]  HPI 81 year old never smoker with SLE diagnosed 2014 for FU of OSA .  Was on INH for Quantiferon test -positive ,  finished course from 12/2012 . Is followed by Rhuematology for SLE, on Plaquenil . Off prednisone.  H/o pulmonary embolism 05/2012 - finished coumadin course  02/2013 .   TEST  PFTs  - 04/2014 - unchanged, FEV1 79%, FVC 78% improves to >80% with BDs, TLC 68%, DLCO 55% CPAP download 3/22-shows excellent usage, no residual events, mild leak which is not significant CTa CHest 07/2014 no PE , + periardial effusion.    05/26/2015 Follow up : OSA  Pt returns for 6 month follow up   Wears CPAP every night  -avg 6-7 hr  No naps, no daytime sleepiness.  Doing well with nasal pillows.  . No issues with leaks.   Previous hx of percardial effusion resolved on recent echo , he was treated with colchicine and then ibuprofen.  Gets winded with long walks. Esp with walking on campus with brief campus.  Feels a mild tinge on left lower ribs with deep inspiration at times.  Some cough on/off. No leg swelling or calf pain.  Studying at [employer], genocide class.  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   Significant tests/ events   CT chest on 05/13/12 showed consolidation in left lower lobe and lingula with a small left parapneumonic effusion but no evidence of pulmonary embolism.  CT abdomen showed small nonobstructive bilateral renal calculi and sigmoid colon diverticulosis and a left adrenal nodule consistent with adenoma.  ANA was noted to be strongly positive at 1 in 1280 , RA factor was mildly positive at 18. Double-stranded DNA was positive, and levels were normal as was CCP antibody. ESR was 60 and anti-Smith antibody was negative  Thoracentesis 05/23/12 of 230 mL of monocytic exudative fluid.  CT angiogram on 06/05/12 showed small subsegmental pulmonary emboli in the right  upper lobe, right middle lobe and left lower lobe. A right effusion was noted somewhat loculated and a small left effusion and was layering.  Echo - normal systolic function with an EF of 55% and no evidence of diastolic dysfunction.  -venous duplex neg  Past Medical History  Diagnosis Date  . Pericardial effusion     a. 08/22/2014 Echo: EF 60-65%, mildly dil Ao root (80m), mildly dil LA, mod-large partially organized pericardial effusion with possible early tamponade - asymptomatic on high dose nsaids-->conservative mgmt;  b. 08/2014 Limited Echo: EF 55-60%, Triv - small effusion.  . Hyperlipidemia   . GERD (gastroesophageal reflux disease)   . Carpal tunnel syndrome, bilateral   . Sleep apnea     a. uses cpap every night  . AAA (abdominal aortic aneurysm) ([place]     a. resolved by ct scan 09-2011  . Arthritis   . Anemia   . Barrett's esophagus   . Essential hypertension   . History of pneumonia   . Lupus ([place]ic lupus erythematosus) ([place]   . Nephrolithiasis    Current Outpatient Prescriptions on File Prior to Visit  Medication Sig Dispense Refill  . aspirin 81 MG tablet Take 81 mg by mouth daily.    .Marland KitchenBioflavonoid Products (BIOFLEX PO) Take 2 tablets by mouth daily.     . calcium-vitamin D (OSCAL WITH D) 500-200 MG-UNIT per tablet Take 1 tablet by mouth daily  with breakfast.     . Cholecalciferol (VITAMIN D3) 5000 UNITS CAPS Take 5,000 Units by mouth daily.    Marland Kitchen esomeprazole (NEXIUM) 40 MG capsule Take 40 mg by mouth daily at 12 noon.    . fenofibrate (TRICOR) 145 MG tablet Take 145 mg by mouth daily.    . ferrous sulfate 325 (65 FE) MG tablet Take 325 mg by mouth daily with breakfast.    . folic acid (FOLVITE) 076 MCG tablet Take 400 mcg by mouth daily.    . hydroxychloroquine (PLAQUENIL) 200 MG tablet Take 200 mg by mouth 2 (two) times daily.    . magnesium oxide (MAG-OX) 400 (241.3 MG) MG tablet Take 1 tablet (400 mg total) by mouth 3 (three) times daily. 60 tablet 0  .  Omega-3 Fatty Acids (FISH OIL) 1200 MG CAPS Take 1,200 mg by mouth daily.     . rosuvastatin (CRESTOR) 5 MG tablet Take 2.5 mg by mouth daily.     . vitamin C (ASCORBIC ACID) 500 MG tablet Take 1,000 mg by mouth daily.     . vitamin E 400 UNIT capsule Take 400 Units by mouth daily.    . Multiple Vitamin (MULTIVITAMIN WITH MINERALS) TABS Take 1 tablet by mouth daily.     No current facility-administered medications on file prior to visit.        Review of Systems neg for any significant sore throat, dysphagia, itching, sneezing, nasal congestion or excess/ purulent secretions, fever, chills, sweats, unintended wt loss, pleuritic or exertional cp, hempoptysis, orthopnea pnd or change in chronic leg swelling. Also denies presyncope, palpitations, heartburn, abdominal pain, nausea, vomiting, diarrhea or change in bowel or urinary habits, dysuria,hematuria, rash, arthralgias, visual complaints, headache, numbness weakness or ataxia.     Objective:   Physical Exam Filed Vitals:   05/26/15 0944  BP: 148/80  Pulse: 84  Temp: 99 F (37.2 C)  TempSrc: Oral  Height: _0  (1.753 m)  Weight: 215 lb (97.523 kg)  SpO2: 95%    Gen. Pleasant, well-nourished, in no distress, elderly  ENT - no lesions, no post nasal drip Neck: No JVD, no thyromegaly, no carotid bruits Lungs: no use of accessory muscles, no dullness to percussion, clear without rales or rhonchi  Cardiovascular: Rhythm regular, heart sounds  normal, no murmurs or gallops, no peripheral edema Musculoskeletal: No deformities, no cyanosis or clubbing  .  Tammy Parrett NP-C  [facility]  05/26/2015      Assessment & Plan:

## 2015-05-26 NOTE — Assessment & Plan Note (Signed)
Resolved on most recent echo

## 2015-05-26 NOTE — Progress Notes (Signed)
Quick Note:  LVM for pt to return call ______ 

## 2015-05-26 NOTE — Patient Instructions (Signed)
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  Follow up Dr. Elsworth Soho  In  6 months  and As needed

## 2015-05-26 NOTE — Assessment & Plan Note (Signed)
Check cxr today .  

## 2015-05-26 NOTE — Assessment & Plan Note (Signed)
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  Follow up Dr. Elsworth Soho  In  6 months  and As needed

## 2015-05-27 LAB — D-DIMER, QUANTITATIVE: D-Dimer, Quant: 2.47 ug/mL-FEU — ABNORMAL HIGH (ref 0.00–0.48)

## 2015-05-28 ENCOUNTER — Telehealth: Payer: Self-pay | Admitting: Adult Health

## 2015-05-28 ENCOUNTER — Encounter: Payer: Self-pay | Admitting: Adult Health

## 2015-05-28 ENCOUNTER — Other Ambulatory Visit: Payer: Self-pay | Admitting: Adult Health

## 2015-05-28 ENCOUNTER — Ambulatory Visit (INDEPENDENT_AMBULATORY_CARE_PROVIDER_SITE_OTHER)
Admission: RE | Admit: 2015-05-28 | Discharge: 2015-05-28 | Disposition: A | Discharge status: Home / Self Care | Payer: Medicare Other | Source: Ambulatory Visit | Attending: Adult Health | Admitting: Adult Health

## 2015-05-28 DIAGNOSIS — R791 Abnormal coagulation profile: Secondary | ICD-10-CM

## 2015-05-28 DIAGNOSIS — J189 Pneumonia, unspecified organism: Secondary | ICD-10-CM | POA: Insufficient documentation

## 2015-05-28 DIAGNOSIS — R0602 Shortness of breath: Secondary | ICD-10-CM | POA: Diagnosis not present

## 2015-05-28 DIAGNOSIS — R7989 Other specified abnormal findings of blood chemistry: Secondary | ICD-10-CM

## 2015-05-28 DIAGNOSIS — M1712 Unilateral primary osteoarthritis, left knee: Secondary | ICD-10-CM | POA: Diagnosis not present

## 2015-05-28 MED ORDER — IOPAMIDOL (ISOVUE-370) INJECTION 76%
80.0000 mL | Freq: Once | INTRAVENOUS | Status: AC | PRN
  Administered 2015-05-28: 80 mL via INTRAVENOUS

## 2015-05-28 NOTE — Progress Notes (Signed)
Quick Note:  LVM for pt to return call ______ 

## 2015-05-28 NOTE — Progress Notes (Signed)
Quick Note:  Called and spoke with pt. Reviewed results and recs. Pt voiced understanding and has no further questions ______

## 2015-05-28 NOTE — Telephone Encounter (Signed)
Set up for a CT a Chest was neg for PE

## 2015-05-28 NOTE — Telephone Encounter (Signed)
CALL REPORT - [facility]  High alert:  D Dimer - 2.47  Sent to Rexene Edison, NP, advised TP's nurse of Call Report message.

## 2015-05-31 NOTE — Progress Notes (Signed)
Quick Note:  Called and spoke with pt. Reviewed results and recs. Scheduled pt with MW on 06/01/15. Pt voiced understanding and had no further questions. Records sent to PCP. ______

## 2015-05-31 NOTE — Progress Notes (Signed)
Reviewed & agree with plan  

## 2015-06-01 ENCOUNTER — Encounter: Payer: Self-pay | Admitting: Internal Medicine

## 2015-06-01 ENCOUNTER — Other Ambulatory Visit (INDEPENDENT_AMBULATORY_CARE_PROVIDER_SITE_OTHER): Payer: Medicare Other

## 2015-06-01 ENCOUNTER — Ambulatory Visit (INDEPENDENT_AMBULATORY_CARE_PROVIDER_SITE_OTHER)
Admission: RE | Admit: 2015-06-01 | Discharge: 2015-06-01 | Disposition: A | Discharge status: Home / Self Care | Payer: Medicare Other | Source: Ambulatory Visit | Attending: Internal Medicine | Admitting: Internal Medicine

## 2015-06-01 ENCOUNTER — Ambulatory Visit (INDEPENDENT_AMBULATORY_CARE_PROVIDER_SITE_OTHER): Payer: Medicare Other | Admitting: Internal Medicine

## 2015-06-01 VITALS — BP 160/80 | HR 79 | Ht 69.5 in | Wt 211.6 lb

## 2015-06-01 DIAGNOSIS — I10 Essential (primary) hypertension: Secondary | ICD-10-CM

## 2015-06-01 DIAGNOSIS — I712 Thoracic aortic aneurysm, without rupture, unspecified: Secondary | ICD-10-CM

## 2015-06-01 DIAGNOSIS — J948 Other specified pleural conditions: Secondary | ICD-10-CM

## 2015-06-01 DIAGNOSIS — J90 Pleural effusion, not elsewhere classified: Secondary | ICD-10-CM

## 2015-06-01 LAB — SEDIMENTATION RATE: SED RATE: 46 mm/h — AB (ref 0–22)

## 2015-06-01 NOTE — Patient Instructions (Addendum)
Please see patient coordinator before you leave today  to schedule L thoracentesis at [facility] and I will call you with results   Please remember to go to the lab  department downstairs for your tests - we will call you with the results when they are available.     Dr Elsworth Soho follow up next available  Late add:  With TA enlargement on CTa and HBP rec add bisoprolol 5 mg daily until sees primary care

## 2015-06-01 NOTE — Progress Notes (Signed)
Subjective:    Patient ID: Tyler Norris, male    DOB: 1934-03-18, 81 y.o.   MRN: [id number]  HPI 81 year old never smoker with SLE diagnosed 2014 and f/u OSA .  Was on INH for Quantiferon test -positive ,  finished course from 12/2012 . Is followed by Rhuematology for SLE, on Plaquenil . Off prednisone.  H/o pulmonary embolism 05/2012 - finished coumadin course  02/2013 .   TEST  PFTs  - 04/2014 - unchanged, FEV1 79%, FVC 78% improves to >80% with BDs, TLC 68%, DLCO 55% CPAP download 3/22-shows excellent usage, no residual events, mild leak which is not significant CTa CHest 07/2014 no PE , + periardial effusion.    05/26/2015 Follow up : OSA  Pt returns for 6 month follow up  Wears CPAP every night  -avg 6-7 hr  No naps, no daytime sleepiness.  Doing well with nasal pillows.  . No issues with leaks.   Previous hx of percardial effusion resolved on recent echo , he was treated with colchicine and then ibuprofen.  Gets winded with long walks. Esp with walking on campus with brief campus.  Feels a mild tinge on left lower ribs with deep inspiration at times x one month Some cough on/off. No leg swelling or calf pain.  Studying at [employer], genocide class.  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  Check chest xray  >  Mod L effusion rec levaquin x 7 d   06/01/2015  Extended  f/u ov/Tyler Norris re: new L effusion  Chief Complaint  Patient presents with  . Follow-up    CXR done today. Pt states breathing is overall doing well. No new co's today.    hx is of indolent onset "catch" on L side with deep breathing that improved on advil and p abx s fever, sob Overall severity was about a 5 and now a 1-2  No obvious day to day or daytime variability or assoc excess/ purulent sputum or mucus plugs or hemoptysis or cp or chest tightness, subjective wheeze or overt sinus or hb symptoms. No unusual exp  hx or h/o childhood pna/ asthma or knowledge of premature birth.  Sleeping ok without nocturnal  or early am exacerbation  of respiratory  c/o's or need for noct saba. Also denies any obvious fluctuation of symptoms with weather or environmental changes or other aggravating or alleviating factors except as outlined above   Current Medications, Allergies, Complete Past Medical History, Past Surgical History, Family History, and Social History were reviewed in [employer] record.  ROS  The following are not active complaints unless bolded sore throat, dysphagia, dental problems, itching, sneezing,  nasal congestion or excess/ purulent secretions, ear ache,   fever, chills, sweats, unintended wt loss, classically  exertional cp,  orthopnea pnd or leg swelling, presyncope, palpitations, abdominal pain, anorexia, nausea, vomiting, diarrhea  or change in bowel or bladder habits, change in stools or urine, dysuria,hematuria,  rash, arthralgias, visual complaints, headache, numbness, weakness or ataxia or problems with walking or coordination,  change in mood/affect or memory.          Significant tests/ events   CT chest on 05/13/12 showed consolidation in left lower lobe and lingula with a small left parapneumonic effusion but no evidence of pulmonary embolism.  CT abdomen showed small nonobstructive bilateral renal calculi and sigmoid colon diverticulosis and  a left adrenal nodule consistent with adenoma.  ANA was noted to be strongly positive at 1 in 1280 , RA factor was mildly positive at 18. Double-stranded DNA was positive, and levels were normal as was CCP antibody. ESR was 60 and anti-Smith antibody was negative  Thoracentesis 05/23/12 of 230 mL of monocytic exudative fluid.  CT angiogram on 06/05/12 showed small subsegmental pulmonary emboli in the right upper lobe, right middle lobe and left lower lobe. A right effusion was noted somewhat loculated and a small left effusion and  was layering.  Echo - normal systolic function with an EF of 55% and no evidence of diastolic dysfunction.  -venous duplex neg        Objective:   Physical Exam  Gen. Pleasant, well-nourished, in no distress, elderly amb wm  Wt Readings from Last 3 Encounters:  06/01/15 211 lb 9.6 oz (95.981 kg)  05/26/15 215 lb (97.523 kg)  01/12/15 214 lb 9.6 oz (97.342 kg)    Vital signs reviewed   ENT - no lesions, no post nasal drip Neck: No JVD, no thyromegaly, no carotid bruits Lungs: no use of accessory muscles, decreased bs/dullness  L base/ no egophony or rub or localized wheeze Cardiovascular: Rhythm regular, heart sounds  normal, no murmurs or gallops, no peripheral edema Musculoskeletal: No deformities, no cyanosis or clubbing    CXR PA and Lateral:   06/01/2015 :    I personally reviewed images and agree with radiology impression as follows:    Decrease in LEFT pleural effusion and basilar atelectasis versus prior study. Enlargement of cardiac silhouette with minimal chronic bronchitic Changes.  Labs ordered/ reviewed:      Chemistry      Component Value Date/Time   NA 140 05/26/2015 1644   K 4.4 05/26/2015 1644   CL 104 05/26/2015 1644   CO2 25 05/26/2015 1644   BUN 16 05/26/2015 1644   CREATININE 0.89 05/26/2015 1644   CREATININE 0.76 08/28/2014 1029      Component Value Date/Time   CALCIUM 9.6 05/26/2015 1644   ALKPHOS 43 08/22/2014 1600   AST 18 08/22/2014 1600   ALT 20 08/22/2014 1600   BILITOT 0.8 08/22/2014 1600        Lab Results  Component Value Date   WBC 9.3 05/26/2015   HGB 12.6* 05/26/2015   HCT 37.0* 05/26/2015   MCV 88.0 05/26/2015   PLT 268.0 05/26/2015     Lab Results  Component Value Date   DDIMER 2.47* 05/26/2015      Lab Results  Component Value Date   TSH 0.586 08/22/2014     Lab Results  Component Value Date   PROBNP 62.0 05/26/2015       Lab Results  Component Value Date   ESRSEDRATE 46* 06/01/2015   ESRSEDRATE 60*  05/14/2012   ESRSEDRATE 55* 05/13/2012             Assessment & Plan:

## 2015-06-02 DIAGNOSIS — I712 Thoracic aortic aneurysm, without rupture, unspecified: Secondary | ICD-10-CM | POA: Insufficient documentation

## 2015-06-02 DIAGNOSIS — J90 Pleural effusion, not elsewhere classified: Secondary | ICD-10-CM | POA: Diagnosis not present

## 2015-06-02 LAB — ANA: ANA: POSITIVE — AB

## 2015-06-02 LAB — ANTI-NUCLEAR AB-TITER (ANA TITER): ANA Titer 1: 1:320 {titer} — ABNORMAL HIGH

## 2015-06-02 NOTE — Progress Notes (Signed)
Quick Note:  Spoke with pt and notified of results per Dr. Wert. Pt verbalized understanding and denied any questions.  ______ 

## 2015-06-02 NOTE — Assessment & Plan Note (Signed)
With TA enlargement on CTa and HBP rec add bisoprolol 5 mg daily until sees primary care

## 2015-06-02 NOTE — Assessment & Plan Note (Addendum)
Onset of symptoms 04/2015 mild pleuritic cp s infection suggested clinically - CT Chest 05/28/15 Multiple foci of coronary artery calcification. No adenopathy. Moderate left pleural effusion with free-flowing and loculated components. There is left base consolidation and atelectasis. 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 On the other hand, parapneumonic seems unlikely clinically and has improved since prev study so malignancy very unlikely as well    I had an extended discussion with the patient reviewing all relevant studies completed to date and  lasting 25  minutes of a 38minute extended office  visit  Re new subacute problem with extensive decision making re etiology of L effusion (previous was on the R)   Discussed in detail all the  indications, usual  risks and alternatives  relative to the benefits with patient who agrees to proceed with L thoracentesis to confirm and also r/o empyema/malignancy - will submit for cyt and ana as well as all the usual studies to sort out    Each maintenance medication was reviewed in detail including most importantly the difference between maintenance and prns and under what circumstances the prns are to be triggered using an action plan format that is not reflected in the computer generated alphabetically organized AVS.    Please see instructions for details which were reviewed in writing and the patient given a copy highlighting the part that I personally wrote and discussed at today's ov.

## 2015-06-02 NOTE — Assessment & Plan Note (Signed)
CTa chest 05/28/15  Prominence of the ascending thoracic aorta with a maximum transverse diameter of 4.1 x 3.8 cm. No dissection evident. Recommend annual imaging followup by CTA or MRA > placed in tickle file for 05/27/16 unless being followed by T surg or cards by then

## 2015-06-03 ENCOUNTER — Telehealth: Payer: Self-pay | Admitting: *Deleted

## 2015-06-03 NOTE — Progress Notes (Signed)
Quick Note:  Spoke with pt and notified of results per Dr. Wert. Pt verbalized understanding and denied any questions.  ______ 

## 2015-06-03 NOTE — Telephone Encounter (Signed)
-----   Message from Tanda Rockers, MD sent at 06/02/2015  9:38 AM EDT ----- With TA enlargement on CTa and HBP noted on vital signs rec add bisoprolol 5 mg daily until sees primary care

## 2015-06-03 NOTE — Telephone Encounter (Signed)
atc X2, line busy.  Wcb.  

## 2015-06-03 NOTE — Telephone Encounter (Signed)
Change bisoprolol to  lopressor 25 mg bid as he says his bp usually runs low - the last 6 visits here that was not the case but defer fine tuning to Dr Sheryn Bison as given the presence of TA enlargement he really needs to be on as high a dose of BB as he can tolerate s symptoms of presyncope  Copy of this phone note to Dr Sheryn Bison please

## 2015-06-03 NOTE — Telephone Encounter (Signed)
Spoke with the pt  He states that he was just seen by his PCP on 06/02/15 and his BP was normal  Dr Sheryn Bison did not want to change any of his meds, so he is hesitant to start on Bisoprolol  Please advise, thanks

## 2015-06-04 ENCOUNTER — Ambulatory Visit (HOSPITAL_COMMUNITY)
Admission: RE | Admit: 2015-06-04 | Discharge: 2015-06-04 | Disposition: A | Discharge status: Home / Self Care | Payer: Medicare Other | Source: Ambulatory Visit | Attending: Radiology | Admitting: Radiology

## 2015-06-04 ENCOUNTER — Ambulatory Visit (HOSPITAL_COMMUNITY)
Admission: RE | Admit: 2015-06-04 | Discharge: 2015-06-04 | Disposition: A | Discharge status: Home / Self Care | Payer: Medicare Other | Source: Ambulatory Visit | Attending: Internal Medicine | Admitting: Internal Medicine

## 2015-06-04 VITALS — BP 167/82

## 2015-06-04 DIAGNOSIS — J948 Other specified pleural conditions: Secondary | ICD-10-CM | POA: Diagnosis not present

## 2015-06-04 DIAGNOSIS — J90 Pleural effusion, not elsewhere classified: Secondary | ICD-10-CM | POA: Diagnosis not present

## 2015-06-04 DIAGNOSIS — M170 Bilateral primary osteoarthritis of knee: Secondary | ICD-10-CM | POA: Diagnosis not present

## 2015-06-04 DIAGNOSIS — Z9889 Other specified postprocedural states: Secondary | ICD-10-CM | POA: Diagnosis not present

## 2015-06-04 LAB — BODY FLUID CELL COUNT WITH DIFFERENTIAL
Eos, Fluid: 0 %
Lymphs, Fluid: 55 %
Monocyte-Macrophage-Serous Fluid: 11 % — ABNORMAL LOW (ref 50–90)
NEUTROPHIL FLUID: 34 % — AB (ref 0–25)
Total Nucleated Cell Count, Fluid: UNDETERMINED cu mm (ref 0–1000)

## 2015-06-04 LAB — GLUCOSE, SEROUS FLUID: GLUCOSE FL: 110 mg/dL

## 2015-06-04 LAB — LACTATE DEHYDROGENASE, PLEURAL OR PERITONEAL FLUID: LD, Fluid: 302 U/L — ABNORMAL HIGH (ref 3–23)

## 2015-06-04 LAB — PROTEIN, BODY FLUID: TOTAL PROTEIN, FLUID: 4.3 g/dL

## 2015-06-04 NOTE — Telephone Encounter (Signed)
ATC pt - line went straight to VM.  LMOM TCB x1

## 2015-06-04 NOTE — Telephone Encounter (Signed)
[phone] pt calling back

## 2015-06-04 NOTE — Procedures (Signed)
Ultrasound-guided diagnostic left thoracentesis performed yielding 60 cc of turbid, amber colored fluid. No immediate complications. Follow-up chest x-ray pending. The left effusion was small by [place].

## 2015-06-05 LAB — GRAM STAIN

## 2015-06-07 MED ORDER — METOPROLOL TARTRATE 25 MG PO TABS: 25.0000 mg | ORAL_TABLET | Freq: Two times a day (BID) | ORAL | Status: DC

## 2015-06-07 NOTE — Telephone Encounter (Signed)
Pt aware of rec's per MW Lopressor 25mg  sent to [facility]. Aware that we will call him as soon as the results from his recent tests come back.  Nothing further needed.

## 2015-06-09 ENCOUNTER — Telehealth: Payer: Self-pay | Admitting: Pulmonary Disease

## 2015-06-09 LAB — CULTURE, BODY FLUID-BOTTLE: CULTURE: NO GROWTH

## 2015-06-09 LAB — CULTURE, BODY FLUID W GRAM STAIN -BOTTLE

## 2015-06-09 NOTE — Telephone Encounter (Signed)
C/w lupus, no evidence of infection or cancer, be sure to make f/u appt with Dr Elsworth Soho to recheck cxr and go over in more detail (w/in 6 weeks ok)

## 2015-06-09 NOTE — Telephone Encounter (Signed)
Spoke with patient-he is aware of results and has OV on 08-04-15 with RA.

## 2015-06-09 NOTE — Telephone Encounter (Signed)
Pt is requesting the results from his thoracentesis that was done last week.  MW - please advise. Thanks!

## 2015-06-18 DIAGNOSIS — M170 Bilateral primary osteoarthritis of knee: Secondary | ICD-10-CM | POA: Diagnosis not present

## 2015-07-15 ENCOUNTER — Telehealth: Payer: Self-pay | Admitting: Internal Medicine

## 2015-07-15 DIAGNOSIS — Z79899 Other long term (current) drug therapy: Secondary | ICD-10-CM | POA: Diagnosis not present

## 2015-07-15 DIAGNOSIS — Z09 Encounter for follow-up examination after completed treatment for conditions other than malignant neoplasm: Secondary | ICD-10-CM | POA: Diagnosis not present

## 2015-07-15 DIAGNOSIS — M349 Systemic sclerosis, unspecified: Secondary | ICD-10-CM | POA: Diagnosis not present

## 2015-07-15 DIAGNOSIS — M2550 Pain in unspecified joint: Secondary | ICD-10-CM | POA: Diagnosis not present

## 2015-07-15 DIAGNOSIS — M25561 Pain in right knee: Secondary | ICD-10-CM | POA: Diagnosis not present

## 2015-07-15 DIAGNOSIS — R300 Dysuria: Secondary | ICD-10-CM | POA: Diagnosis not present

## 2015-07-15 DIAGNOSIS — M174 Other bilateral secondary osteoarthritis of knee: Secondary | ICD-10-CM | POA: Diagnosis not present

## 2015-07-15 NOTE — Telephone Encounter (Signed)
Spoke with the pt  He states Dr Estanislado Pandy wanted to make sure that MW and RA were aware of his body fluid results from thora that was done in April 2017  I advised that it looks like we already discussed this, according to phone note dated 06/09/15  Pt does recall this and states nothing further needed  He will keep f/u here with RA in June  I faxed Dr Estanislado Pandy a copy of the 06/09/15  Phone note

## 2015-07-20 DIAGNOSIS — G453 Amaurosis fugax: Secondary | ICD-10-CM | POA: Diagnosis not present

## 2015-07-20 DIAGNOSIS — R1033 Periumbilical pain: Secondary | ICD-10-CM | POA: Diagnosis not present

## 2015-07-21 ENCOUNTER — Other Ambulatory Visit: Payer: Self-pay | Admitting: Family Medicine

## 2015-07-21 DIAGNOSIS — R1033 Periumbilical pain: Secondary | ICD-10-CM

## 2015-07-21 DIAGNOSIS — G453 Amaurosis fugax: Secondary | ICD-10-CM

## 2015-07-28 DIAGNOSIS — Z79899 Other long term (current) drug therapy: Secondary | ICD-10-CM | POA: Diagnosis not present

## 2015-07-28 DIAGNOSIS — Z0100 Encounter for examination of eyes and vision without abnormal findings: Secondary | ICD-10-CM | POA: Diagnosis not present

## 2015-07-29 ENCOUNTER — Ambulatory Visit
Admission: RE | Admit: 2015-07-29 | Discharge: 2015-07-29 | Disposition: A | Discharge status: Home / Self Care | Payer: Medicare Other | Source: Ambulatory Visit | Attending: Family Medicine | Admitting: Family Medicine

## 2015-07-29 DIAGNOSIS — G453 Amaurosis fugax: Secondary | ICD-10-CM

## 2015-07-29 DIAGNOSIS — N281 Cyst of kidney, acquired: Secondary | ICD-10-CM | POA: Diagnosis not present

## 2015-07-29 DIAGNOSIS — I6523 Occlusion and stenosis of bilateral carotid arteries: Secondary | ICD-10-CM | POA: Diagnosis not present

## 2015-07-29 DIAGNOSIS — R1033 Periumbilical pain: Secondary | ICD-10-CM

## 2015-08-04 ENCOUNTER — Ambulatory Visit (INDEPENDENT_AMBULATORY_CARE_PROVIDER_SITE_OTHER): Payer: Medicare Other | Admitting: Pulmonary Disease

## 2015-08-04 ENCOUNTER — Ambulatory Visit (INDEPENDENT_AMBULATORY_CARE_PROVIDER_SITE_OTHER)
Admission: RE | Admit: 2015-08-04 | Discharge: 2015-08-04 | Disposition: A | Discharge status: Home / Self Care | Payer: Medicare Other | Source: Ambulatory Visit | Attending: Pulmonary Disease | Admitting: Pulmonary Disease

## 2015-08-04 ENCOUNTER — Encounter: Payer: Self-pay | Admitting: Pulmonary Disease

## 2015-08-04 VITALS — BP 148/78 | HR 66 | Ht 69.0 in | Wt 216.2 lb

## 2015-08-04 DIAGNOSIS — G4733 Obstructive sleep apnea (adult) (pediatric): Secondary | ICD-10-CM

## 2015-08-04 DIAGNOSIS — Z9989 Dependence on other enabling machines and devices: Secondary | ICD-10-CM

## 2015-08-04 DIAGNOSIS — J90 Pleural effusion, not elsewhere classified: Secondary | ICD-10-CM

## 2015-08-04 DIAGNOSIS — J948 Other specified pleural conditions: Secondary | ICD-10-CM

## 2015-08-04 MED ORDER — PREDNISONE 10 MG PO TABS: ORAL_TABLET | ORAL | Status: DC

## 2015-08-04 NOTE — Progress Notes (Signed)
   Subjective:    Patient ID: Tyler Norris, male    DOB: January 28, 1935, 81 y.o.   MRN: [id number]  HPI  81 year old never smoker with SLE diagnosed 2014 for FU of OSA .  Was on INH for Quantiferon test -positive ,  finished course from 12/2012 . Followed by Rhuematology for SLE, on Plaquenil . Off prednisone. Pleural effusions resolved. H/o pulmonary embolism 05/2012 - finished coumadin course  02/2013 .   08/04/2015  Chief Complaint  Patient presents with  . Follow-up    patient is doing well on CPAP. mask is not fitting properly.  Thinkshe needs higher pressure setting.  pt complains of shallow breathing.    He developed left pleural effusion and underwent thoracentesis -about 60 mL was removed for diagnostic purposes-this showed a monocytic exudate as before He complains of substernal chest pain occasionally and coughing on exertion He also reports mild arthritis in his ankles and knees requiring injections His appointment with his rheumatologist is only in August No evidence of pericardial effusion on echo from 10/2014     CPAP download 07/2015 -shows excellent usage, no residual events, mild leak which is not significant    Significant tests/ events   CT chest on 04/2012 showed consolidation in left lower lobe and lingula with a small left parapneumonic effusion but no evidence of pulmonary embolism.  CT abdomen showed small nonobstructive bilateral renal calculi and sigmoid colon diverticulosis and a left adrenal nodule consistent with adenoma.  ANA was noted to be strongly positive at 1 in 1280 , RA factor was mildly positive at 18. Double-stranded DNA was positive, and levels were normal as was CCP antibody. ESR was 60 and anti-Smith antibody was negative  Thoracentesis 04/2012 of 230 mL of monocytic exudative fluid.   CT angiogram on 06/05/12 showed small subsegmental pulmonary emboli in the right upper lobe, right middle lobe and left lower lobe. A right effusion was noted  somewhat loculated and a small left effusion and was layering.  Echo - normal systolic function with an EF of 55% and no evidence of diastolic dysfunction.  -venous duplex neg  Adm 07/2014 -Pericardial effusion with near tamponade, picked up on CT angio , resolved on FU 10/2014  - on NSAIDs   PFTs  - 04/2014 - unchanged, FEV1 79%, FVC 78% improves to >80% with BDs, TLC 68%, DLCO 55%   Review of Systems neg for any significant sore throat, dysphagia, itching, sneezing, nasal congestion or excess/ purulent secretions, fever, chills, sweats, unintended wt loss, pleuritic or exertional cp, hempoptysis, orthopnea pnd or change in chronic leg swelling. Also denies presyncope, palpitations, heartburn, abdominal pain, nausea, vomiting, diarrhea or change in bowel or urinary habits, dysuria,hematuria, rash, arthralgias, visual complaints, headache, numbness weakness or ataxia.     Objective:   Physical Exam  Gen. Pleasant, well-nourished, in no distress ENT - no lesions, no post nasal drip Neck: No JVD, no thyromegaly, no carotid bruits Lungs: no use of accessory muscles, no dullness to percussion, clear without rales or rhonchi  Cardiovascular: Rhythm regular, heart sounds  normal, no murmurs or gallops, no peripheral edema Musculoskeletal: No deformities, no cyanosis or clubbing         Assessment & Plan:

## 2015-08-04 NOTE — Assessment & Plan Note (Signed)
Good compliance on CPAP 9 cm

## 2015-08-04 NOTE — Patient Instructions (Addendum)
Chest x-ray today You had inflammation of pleura (covering of the lungs)- call pleuritis This is likely related to lupus  Trial of low-dose prednisone 10 mg for 2 weeks followed by 5 mg for 2 weeks then stop  I will discuss with Dr. Estanislado Pandy about increasing your medication for lupus

## 2015-08-04 NOTE — Assessment & Plan Note (Signed)
Chest x-ray today You had inflammation of pleura (covering of the lungs)- call pleuritis This is likely related to lupus  Trial of low-dose prednisone 10 mg for 2 weeks followed by 5 mg for 2 weeks then stop  I will discuss with Dr. Estanislado Pandy about increasing your medication for lupus

## 2015-08-08 ENCOUNTER — Other Ambulatory Visit: Payer: Self-pay | Admitting: Internal Medicine

## 2015-08-09 ENCOUNTER — Telehealth: Payer: Self-pay | Admitting: Pulmonary Disease

## 2015-08-09 NOTE — Telephone Encounter (Signed)
Result Notes     Notes Recorded by Glean Hess, CMA on 08/05/2015 at 11:48 AM Left message for patient to call back. ------ Notes Recorded by Rigoberto Noel, MD on 08/04/2015 at 1:42 PM Volume of fluid decreased-very minimal now   Pt is aware of results. Nothing further was needed.

## 2015-08-12 ENCOUNTER — Telehealth: Payer: Self-pay | Admitting: Cardiovascular Disease

## 2015-08-12 NOTE — Telephone Encounter (Signed)
Spoke with patient who called to ask if he has a history of congestive heart failure.  He states that it was mentioned as a possibility when he presented to the hospital last year with symptoms of unknown cause.  I advised him that he had a pericardial effusion but there is no mention of CHF anywhere in his chart.  He thanked me for the call.

## 2015-08-12 NOTE — Telephone Encounter (Signed)
Tyler Norris is calling in reference to finding out if he has been diagnose with CHF .Marland Kitchen Please call   Thanks

## 2015-08-13 ENCOUNTER — Encounter: Payer: Self-pay | Admitting: Pulmonary Disease

## 2015-08-18 DIAGNOSIS — M3219 Other organ or system involvement in systemic lupus erythematosus: Secondary | ICD-10-CM | POA: Diagnosis not present

## 2015-08-18 DIAGNOSIS — R319 Hematuria, unspecified: Secondary | ICD-10-CM | POA: Diagnosis not present

## 2015-08-18 DIAGNOSIS — M174 Other bilateral secondary osteoarthritis of knee: Secondary | ICD-10-CM | POA: Diagnosis not present

## 2015-08-18 DIAGNOSIS — Z09 Encounter for follow-up examination after completed treatment for conditions other than malignant neoplasm: Secondary | ICD-10-CM | POA: Diagnosis not present

## 2015-10-19 DIAGNOSIS — I10 Essential (primary) hypertension: Secondary | ICD-10-CM | POA: Diagnosis not present

## 2015-10-19 DIAGNOSIS — Z23 Encounter for immunization: Secondary | ICD-10-CM | POA: Diagnosis not present

## 2015-10-19 DIAGNOSIS — R739 Hyperglycemia, unspecified: Secondary | ICD-10-CM | POA: Diagnosis not present

## 2015-10-19 DIAGNOSIS — M329 Systemic lupus erythematosus, unspecified: Secondary | ICD-10-CM | POA: Diagnosis not present

## 2015-10-19 DIAGNOSIS — E782 Mixed hyperlipidemia: Secondary | ICD-10-CM | POA: Diagnosis not present

## 2015-10-19 DIAGNOSIS — R499 Unspecified voice and resonance disorder: Secondary | ICD-10-CM | POA: Diagnosis not present

## 2015-11-04 ENCOUNTER — Other Ambulatory Visit: Payer: Self-pay

## 2015-11-09 ENCOUNTER — Ambulatory Visit (INDEPENDENT_AMBULATORY_CARE_PROVIDER_SITE_OTHER)
Admission: RE | Admit: 2015-11-09 | Discharge: 2015-11-09 | Disposition: A | Discharge status: Home / Self Care | Payer: Medicare Other | Source: Ambulatory Visit | Attending: Adult Health | Admitting: Adult Health

## 2015-11-09 ENCOUNTER — Encounter: Payer: Self-pay | Admitting: Adult Health

## 2015-11-09 ENCOUNTER — Ambulatory Visit (INDEPENDENT_AMBULATORY_CARE_PROVIDER_SITE_OTHER): Payer: Medicare Other | Admitting: Adult Health

## 2015-11-09 VITALS — BP 142/80 | HR 68 | Temp 97.9°F | Ht 69.0 in | Wt 227.0 lb

## 2015-11-09 DIAGNOSIS — J948 Other specified pleural conditions: Secondary | ICD-10-CM

## 2015-11-09 DIAGNOSIS — G4733 Obstructive sleep apnea (adult) (pediatric): Secondary | ICD-10-CM | POA: Diagnosis not present

## 2015-11-09 DIAGNOSIS — J90 Pleural effusion, not elsewhere classified: Secondary | ICD-10-CM

## 2015-11-09 DIAGNOSIS — Z9989 Dependence on other enabling machines and devices: Secondary | ICD-10-CM

## 2015-11-09 DIAGNOSIS — R0789 Other chest pain: Secondary | ICD-10-CM | POA: Diagnosis not present

## 2015-11-09 NOTE — Assessment & Plan Note (Signed)
Doing well on CPAP  Check CPAP download   Plan  Patient Instructions  Continue on CPAP At bedtime  .  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  Follow up with Dr. Sheryn Bison for stomach issues as discussed.  Follow up Dr. Elsworth Soho  In  4 months  and As needed

## 2015-11-09 NOTE — Progress Notes (Signed)
Subjective:    Patient ID: PAT SIRES, male    DOB: [birth year], 81 y.o.   MRN: [id number]  HPI 81 year old never smoker with SLE diagnosed 2014 followed for OSA  Was on INH for Quantiferon test -positive ,  finished course from 12/2012 . Followed by Rhuematology for SLE, on Plaquenil . Off prednisone. Pleural effusions resolved. H/o pulmonary embolism 05/2012 - finished coumadin course  02/2013 .   TEST /Events  05/2015 left pleural effusion and underwent thoracentesis -about 60 mL was removed for diagnostic purposes-this showed a monocytic exudate as before No evidence of pericardial effusion on echo from 10/2014 CPAP download 07/2015 -shows excellent usage, no residual events, mild leak which is not significant    Significant tests/ events   CT chest on 04/2012 showed consolidation in left lower lobe and lingula with a small left parapneumonic effusion but no evidence of pulmonary embolism.  CT abdomen showed small nonobstructive bilateral renal calculi and sigmoid colon diverticulosis and a left adrenal nodule consistent with adenoma.  ANA was noted to be strongly positive at 1 in 1280 , RA factor was mildly positive at 18. Double-stranded DNA was positive, and levels were normal as was CCP antibody. ESR was 60 and anti-Smith antibody was negative  Thoracentesis 04/2012 of 230 mL of monocytic exudative fluid.   CT angiogram on 06/05/12 showed small subsegmental pulmonary emboli in the right upper lobe, right middle lobe and left lower lobe. A right effusion was noted somewhat loculated and a small left effusion and was layering.  Echo - normal systolic function with an EF of 55% and no evidence of diastolic dysfunction.  -venous duplex neg  Adm 07/2014 -Pericardial effusion with near tamponade, picked up on CT angio , resolved on FU 10/2014  - on NSAIDs   PFTs  - 04/2014 - unchanged, FEV1 79%, FVC 78% improves to >80% with BDs, TLC 68%, DLCO 55%    11/09/2015 Follow up : OSA and  Pleural Effusion (Lupus)  Pt returns for a 3 month follow up .  He has OSA on CPAP At bedtime  . Doing well , wears on avg 7hr each night .  No sign daytime sleepiness. No download available today . Order sent to DME .  Was having head strap issues, went to fabric store and got new velcro that is working much better.  He is very pleased with this.    Pt had left pleural effusion in 05/2015 felt related to Lupus. Underwent thoracentesis .  This improved after throacentesis , and steroids .  CXR 07/2015 showed a tiny left pleural effusion .  He remains on Plaquenil , followed by Rheumatology .  He denies chest pain, orthopnea, edema or fever.  Had some left pleurtic pain with cough intermittently last ov , this resolved over last couple of months.  But now had upper epigastric discomfort intermittently , worried the fluid in lungs has come back.  Had this back in June and had Abd [place] that was essentially unremarkable except abd Aortic ectasisa 2.9cm w/ recs to follow in 41yr.   He denies chest pain, syncope , back pain, hemoptysis , fever .     Past Medical History:  Diagnosis Date  . AAA (abdominal aortic aneurysm) ([place]    a. resolved by ct scan 09-2011  . Anemia   . Arthritis   . Barrett's esophagus   . Carpal tunnel syndrome, bilateral   . Essential hypertension   . GERD (gastroesophageal reflux disease)   .  History of pneumonia   . Hyperlipidemia   . Lupus (systemic lupus erythematosus) ([place])   . Nephrolithiasis   . Pericardial effusion    a. 08/22/2014 Echo: EF 60-65%, mildly dil Ao root (3m), mildly dil LA, mod-large partially organized pericardial effusion with possible early tamponade - asymptomatic on high dose nsaids-->conservative mgmt;  b. 08/2014 Limited Echo: EF 55-60%, Triv - small effusion.  . Sleep apnea    a. uses cpap every night   Current Outpatient Prescriptions on File Prior to Visit  Medication Sig Dispense Refill  . aspirin 81 MG tablet Take 81 mg by mouth  daily.    .Marland KitchenBioflavonoid Products (BIOFLEX PO) Take 2 tablets by mouth daily.     . calcium-vitamin D (OSCAL WITH D) 500-200 MG-UNIT per tablet Take 1 tablet by mouth daily with breakfast.     . Cholecalciferol (VITAMIN D3) 5000 UNITS CAPS Take 5,000 Units by mouth daily.    .Marland Kitchenesomeprazole (NEXIUM) 40 MG capsule Take 40 mg by mouth daily at 12 noon.    . fenofibrate (TRICOR) 145 MG tablet Take 145 mg by mouth daily.    . ferrous sulfate 325 (65 FE) MG tablet Take 325 mg by mouth daily with breakfast.    . folic acid (FOLVITE) 4448MCG tablet Take 400 mcg by mouth daily.    . hydroxychloroquine (PLAQUENIL) 200 MG tablet Take 200 mg by mouth 2 (two) times daily.    . magnesium oxide (MAG-OX) 400 (241.3 MG) MG tablet Take 1 tablet (400 mg total) by mouth 3 (three) times daily. 60 tablet 0  . Multiple Vitamin (MULTIVITAMIN WITH MINERALS) TABS Take 1 tablet by mouth daily.    . Omega-3 Fatty Acids (FISH OIL) 1200 MG CAPS Take 1,200 mg by mouth daily.     . rosuvastatin (CRESTOR) 5 MG tablet Take 2.5 mg by mouth daily.     . vitamin C (ASCORBIC ACID) 500 MG tablet Take 1,000 mg by mouth daily.     . vitamin E 400 UNIT capsule Take 400 Units by mouth daily.    . metoprolol tartrate (LOPRESSOR) 25 MG tablet Take 1 tablet (25 mg total) by mouth 2 (two) times daily. (Patient not taking: Reported on 11/09/2015) 60 tablet 1   No current facility-administered medications on file prior to visit.     Review of Systems neg for any significant sore throat, dysphagia, itching, sneezing, nasal congestion or excess/ purulent secretions, fever, chills, sweats, unintended wt loss, pleuritic or exertional cp, hempoptysis, orthopnea pnd or change in chronic leg swelling. Also denies presyncope, palpitations, heartburn, abdominal pain, nausea, vomiting, diarrhea or change in bowel or urinary habits, dysuria,hematuria, rash, arthralgias, visual complaints, headache, numbness weakness or ataxia.     Objective:    Physical Exam Vitals:   11/09/15 0931  BP: (!) 142/80  Pulse: 68  Temp: 97.9 F (36.6 C)  TempSrc: Oral  SpO2: 95%  Weight: 227 lb (103 kg)  Height: 5' 9"  (1.753 m)  Body mass index is 33.52 kg/m.   Gen. Pleasant, well-nourished, in no distress ENT - no lesions, no post nasal drip, Class 2-3 MP airway  Neck: No JVD, no thyromegaly, no carotid bruits Lungs: no use of accessory muscles, no dullness to percussion, clear without rales or rhonchi  Cardiovascular: Rhythm regular, heart sounds  normal, no murmurs or gallops, no peripheral edema Musculoskeletal: No deformities, no cyanosis or clubbing  ABD : soft, BS + no guarding or rebound.   Virgie Kunda NP-C  [facility]  11/09/2015

## 2015-11-09 NOTE — Assessment & Plan Note (Signed)
Check CXR today .   Plan  Patient Instructions  Continue on CPAP At bedtime  .  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  Follow up with Dr. Sheryn Bison for stomach issues as discussed.  Follow up Dr. Elsworth Soho  In  4 months  and As needed

## 2015-11-09 NOTE — Patient Instructions (Signed)
Continue on CPAP At bedtime  .  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  Follow up with Dr. Sheryn Bison for stomach issues as discussed.  Follow up Dr. Elsworth Soho  In  4 months  and As needed

## 2015-11-10 ENCOUNTER — Encounter: Payer: Self-pay | Admitting: Adult Health

## 2015-11-10 NOTE — Progress Notes (Signed)
lmtcb x1 

## 2015-11-11 NOTE — Progress Notes (Signed)
Called spoke with pt. Reviewed results and recs. Pt voiced understanding and had no further questions.

## 2015-11-13 NOTE — Progress Notes (Signed)
Reviewed & agree with plan  

## 2015-11-15 DIAGNOSIS — K219 Gastro-esophageal reflux disease without esophagitis: Secondary | ICD-10-CM | POA: Diagnosis not present

## 2015-11-15 DIAGNOSIS — R490 Dysphonia: Secondary | ICD-10-CM | POA: Diagnosis not present

## 2015-11-15 DIAGNOSIS — J343 Hypertrophy of nasal turbinates: Secondary | ICD-10-CM | POA: Diagnosis not present

## 2015-11-16 ENCOUNTER — Telehealth: Payer: Self-pay | Admitting: Adult Health

## 2015-11-16 NOTE — Telephone Encounter (Signed)
Per verbal order from TP after reviewing CPAP download from 10/12/15-11/10/15  Excellent usage and control No changes at this time  Called spoke with pt. Reviewed results and recs. Pt voiced understanding and had no further questions. Nothing further needed.

## 2015-12-07 ENCOUNTER — Ambulatory Visit (INDEPENDENT_AMBULATORY_CARE_PROVIDER_SITE_OTHER): Payer: Medicare Other | Admitting: Rheumatology

## 2015-12-07 DIAGNOSIS — M25561 Pain in right knee: Secondary | ICD-10-CM | POA: Diagnosis not present

## 2015-12-07 DIAGNOSIS — M79641 Pain in right hand: Secondary | ICD-10-CM

## 2015-12-07 DIAGNOSIS — M79642 Pain in left hand: Secondary | ICD-10-CM

## 2015-12-07 DIAGNOSIS — M79671 Pain in right foot: Secondary | ICD-10-CM

## 2015-12-07 DIAGNOSIS — Z79899 Other long term (current) drug therapy: Secondary | ICD-10-CM | POA: Diagnosis not present

## 2015-12-07 DIAGNOSIS — R300 Dysuria: Secondary | ICD-10-CM | POA: Diagnosis not present

## 2015-12-07 DIAGNOSIS — M25562 Pain in left knee: Secondary | ICD-10-CM | POA: Diagnosis not present

## 2015-12-07 DIAGNOSIS — M2550 Pain in unspecified joint: Secondary | ICD-10-CM | POA: Diagnosis not present

## 2015-12-07 DIAGNOSIS — M79672 Pain in left foot: Secondary | ICD-10-CM | POA: Diagnosis not present

## 2015-12-20 ENCOUNTER — Ambulatory Visit: Payer: Self-pay | Admitting: Rheumatology

## 2016-03-10 DIAGNOSIS — E782 Mixed hyperlipidemia: Secondary | ICD-10-CM | POA: Diagnosis not present

## 2016-03-10 DIAGNOSIS — F418 Other specified anxiety disorders: Secondary | ICD-10-CM | POA: Diagnosis not present

## 2016-03-10 DIAGNOSIS — I10 Essential (primary) hypertension: Secondary | ICD-10-CM | POA: Diagnosis not present

## 2016-03-10 DIAGNOSIS — R739 Hyperglycemia, unspecified: Secondary | ICD-10-CM | POA: Diagnosis not present

## 2016-03-10 DIAGNOSIS — K2270 Barrett's esophagus without dysplasia: Secondary | ICD-10-CM | POA: Diagnosis not present

## 2016-03-10 DIAGNOSIS — G4733 Obstructive sleep apnea (adult) (pediatric): Secondary | ICD-10-CM | POA: Diagnosis not present

## 2016-03-10 DIAGNOSIS — I714 Abdominal aortic aneurysm, without rupture: Secondary | ICD-10-CM | POA: Diagnosis not present

## 2016-03-10 DIAGNOSIS — M329 Systemic lupus erythematosus, unspecified: Secondary | ICD-10-CM | POA: Diagnosis not present

## 2016-03-14 ENCOUNTER — Ambulatory Visit (INDEPENDENT_AMBULATORY_CARE_PROVIDER_SITE_OTHER): Payer: Medicare Other | Admitting: Pulmonary Disease

## 2016-03-14 ENCOUNTER — Encounter: Payer: Self-pay | Admitting: Pulmonary Disease

## 2016-03-14 DIAGNOSIS — G4733 Obstructive sleep apnea (adult) (pediatric): Secondary | ICD-10-CM

## 2016-03-14 DIAGNOSIS — Z9989 Dependence on other enabling machines and devices: Secondary | ICD-10-CM | POA: Diagnosis not present

## 2016-03-14 DIAGNOSIS — J90 Pleural effusion, not elsewhere classified: Secondary | ICD-10-CM | POA: Diagnosis not present

## 2016-03-14 NOTE — Assessment & Plan Note (Signed)
Continue Plaquenil-effusions seem to have resolved

## 2016-03-14 NOTE — Progress Notes (Signed)
Subjective:    Patient ID: Tyler Norris, male    DOB: [birth year], 82 y.o.   MRN: [id number]  HPI  83 year old never smoker with SLE diagnosed 2014 for FU of OSA .  Was on INH for Quantiferon test -positive ,  finished course from 12/2012 . Followed by Rhuematology for SLE, on Plaquenil . Off prednisone. Pleural effusions resolved. H/o pulmonary embolism 05/2012 - finished coumadin course  02/2013 .   03/14/2016  Chief Complaint  Patient presents with  . Follow-up    4 mo f/u for OSA. Breathing has been ok since last visit.     Four-month follow-up After recurrence of pleural effusion in 05/2015 he was started on Plaquenil and has done well on this-no side effects, does get his eyes checked  He has a Respironics machine for at least 4 years and this seems to be working well . He feels rested on waking up in the morning and denies daytime somnolence or fatigue. 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 AAA (abdominal aortic aneurysm) ([place])    a. resolved by ct scan 09-2011  . Anemia   . Arthritis   . Barrett's esophagus   . Carpal tunnel syndrome, bilateral   . Essential hypertension   . GERD (gastroesophageal reflux disease)   . History of pneumonia   . Hyperlipidemia   . Lupus (systemic lupus erythematosus) ([place])   . Nephrolithiasis   . Pericardial effusion    a. 08/22/2014 Echo: EF 60-65%, mildly dil Ao root (69m), mildly dil LA, mod-large partially organized pericardial effusion with possible early tamponade - asymptomatic on high dose nsaids-->conservative mgmt;  b. 08/2014 Limited Echo: EF 55-60%, Triv - small effusion.  . Sleep apnea    a. uses cpap  every night    Significant tests/ events   CT chest on 04/2012 showed consolidation in left lower lobe and lingula with a small left parapneumonic effusion but no evidence of pulmonary embolism.  CT abdomen showed small nonobstructive bilateral renal calculi and sigmoid colon diverticulosis and a left adrenal nodule consistent with adenoma.  ANA was noted to be strongly positive at 1 in 1280 , RA factor was mildly positive at 18. Double-stranded DNA was positive, and levels were normal as was CCP antibody. ESR was 60 and anti-Smith antibody was negative  Thoracentesis 04/2012 of 230 mL of monocytic exudative fluid.   CT angiogram on 06/05/12 showed small subsegmental pulmonary emboli in the right upper lobe, right middle lobe and left lower lobe. A right effusion was noted somewhat loculated and a small left effusion and was layering.  Echo - normal systolic function with an EF of 55% and no evidence of diastolic dysfunction.  -venous duplex neg  Adm 07/2014 -Pericardial effusion with near tamponade, picked up on CT angio , resolved on FU 10/2014  - on NSAIDs   PFTs  - 04/2014 - unchanged, FEV1 79%, FVC 78% improves to >80% with BDs, TLC 68%, DLCO 55%  05/2015 He developed left pleural effusion and underwent thoracentesis -about 60 mL was removed for diagnostic purposes-this showed a monocytic exudate as before>> plaquanil added   Review of  Systems neg for any significant sore throat, dysphagia, itching, sneezing, nasal congestion or excess/ purulent secretions, fever, chills, sweats, unintended wt loss, pleuritic or exertional cp, hempoptysis, orthopnea pnd or change in chronic leg swelling. Also denies presyncope, palpitations, heartburn, abdominal pain, nausea, vomiting, diarrhea or change in bowel or urinary habits, dysuria,hematuria, rash, arthralgias, visual complaints, headache, numbness weakness or ataxia.     Objective:   Physical Exam  Gen. Pleasant, well-nourished, in no  distress ENT - no lesions, no post nasal drip Neck: No JVD, no thyromegaly, no carotid bruits Lungs: no use of accessory muscles, no dullness to percussion, clear without rales or rhonchi  Cardiovascular: Rhythm regular, heart sounds  normal, no murmurs or gallops, no peripheral edema Musculoskeletal: No deformities, no cyanosis or clubbing        Assessment & Plan:

## 2016-03-14 NOTE — Assessment & Plan Note (Signed)
CPAP 9 cm seems to be adequate with good control of events. He has good improvement in his daytime somnolence and fatigue and appears to be very compliant  CPAP supplies will be continued for a year

## 2016-03-14 NOTE — Patient Instructions (Signed)
Your CPAP is set at 9 cm and seems to be working well Call if machine gives any problems or if new symptoms of shortness of breath

## 2016-08-03 DIAGNOSIS — Z79899 Other long term (current) drug therapy: Secondary | ICD-10-CM | POA: Diagnosis not present

## 2016-08-15 DIAGNOSIS — I10 Essential (primary) hypertension: Secondary | ICD-10-CM | POA: Diagnosis not present

## 2016-08-15 DIAGNOSIS — M329 Systemic lupus erythematosus, unspecified: Secondary | ICD-10-CM | POA: Diagnosis not present

## 2016-08-15 DIAGNOSIS — K2270 Barrett's esophagus without dysplasia: Secondary | ICD-10-CM | POA: Diagnosis not present

## 2016-08-15 DIAGNOSIS — F418 Other specified anxiety disorders: Secondary | ICD-10-CM | POA: Diagnosis not present

## 2016-08-15 DIAGNOSIS — E785 Hyperlipidemia, unspecified: Secondary | ICD-10-CM | POA: Diagnosis not present

## 2016-08-15 DIAGNOSIS — G4733 Obstructive sleep apnea (adult) (pediatric): Secondary | ICD-10-CM | POA: Diagnosis not present

## 2016-08-15 DIAGNOSIS — I2510 Atherosclerotic heart disease of native coronary artery without angina pectoris: Secondary | ICD-10-CM | POA: Diagnosis not present

## 2016-08-15 DIAGNOSIS — E119 Type 2 diabetes mellitus without complications: Secondary | ICD-10-CM | POA: Diagnosis not present

## 2016-11-21 DIAGNOSIS — F418 Other specified anxiety disorders: Secondary | ICD-10-CM | POA: Diagnosis not present

## 2016-11-21 DIAGNOSIS — E785 Hyperlipidemia, unspecified: Secondary | ICD-10-CM | POA: Diagnosis not present

## 2016-11-21 DIAGNOSIS — M329 Systemic lupus erythematosus, unspecified: Secondary | ICD-10-CM | POA: Diagnosis not present

## 2016-11-21 DIAGNOSIS — I10 Essential (primary) hypertension: Secondary | ICD-10-CM | POA: Diagnosis not present

## 2016-11-21 DIAGNOSIS — K2270 Barrett's esophagus without dysplasia: Secondary | ICD-10-CM | POA: Diagnosis not present

## 2016-11-21 DIAGNOSIS — E119 Type 2 diabetes mellitus without complications: Secondary | ICD-10-CM | POA: Diagnosis not present

## 2016-11-21 DIAGNOSIS — M159 Polyosteoarthritis, unspecified: Secondary | ICD-10-CM | POA: Diagnosis not present

## 2016-11-21 DIAGNOSIS — G4733 Obstructive sleep apnea (adult) (pediatric): Secondary | ICD-10-CM | POA: Diagnosis not present

## 2016-12-06 ENCOUNTER — Telehealth: Payer: Self-pay | Admitting: Rheumatology

## 2016-12-06 NOTE — Telephone Encounter (Signed)
Patient calling to schedule appt to check Lupus per GP. Patient unable to come this week, moving wife to a skilled care facility. Please advise.

## 2016-12-06 NOTE — Telephone Encounter (Signed)
Attempted to contact the patient and left message for patient to call the office.  

## 2016-12-07 NOTE — Telephone Encounter (Signed)
Patient scheduled for an appointment on 12/11/16

## 2016-12-11 ENCOUNTER — Ambulatory Visit (INDEPENDENT_AMBULATORY_CARE_PROVIDER_SITE_OTHER): Payer: Medicare Other | Admitting: Rheumatology

## 2016-12-11 ENCOUNTER — Encounter: Payer: Self-pay | Admitting: Rheumatology

## 2016-12-11 VITALS — BP 174/83 | HR 61 | Resp 18 | Ht 69.0 in | Wt 221.0 lb

## 2016-12-11 DIAGNOSIS — M329 Systemic lupus erythematosus, unspecified: Secondary | ICD-10-CM

## 2016-12-11 DIAGNOSIS — R7612 Nonspecific reaction to cell mediated immunity measurement of gamma interferon antigen response without active tuberculosis: Secondary | ICD-10-CM | POA: Diagnosis not present

## 2016-12-11 DIAGNOSIS — Z79899 Other long term (current) drug therapy: Secondary | ICD-10-CM

## 2016-12-11 NOTE — Patient Instructions (Signed)
 #  1: Posterior left ear has a lesion that should be evaluated further by dermatologist. Please make appointment They can see image taken today in office by asking for your chart form today's visit.  #2: Annual visit with pulmonologist and cardiologist as recommended. You have 76% lung capacity as discussed in the office. Please have Dr.ALVA send [place] his office note after you see him for the next annual visit. Please have your cardiologist send [place] his office visit note when you see him for the next annual visit.

## 2016-12-11 NOTE — Progress Notes (Signed)
Office Visit Note  Patient: Tyler Norris             Date of Birth: 1934-04-08           MRN: [id number]             PCP: [name], MD Referring: [name], MD Visit Date: 12/11/2016 Occupation: @GUAROCC @    Subjective:  Lupus   History of Present Illness: Tyler Norris is a 82 y.o. male   last seen oct 2017.  Sees dr Sheryn Bison (pcp).  Patient had a Plaquenil eye exam about 2-3 months ago (July August or September 2018 at Dr. Claudean Kinds. Patient states that he was told that his Plaquenil eye exam was normal.  Patient takes Plaquenil 200 mg twice a day every day.  Saw lung doctor jan 2018 or dec 2017 and everything was "fine".==> Dr. Elsworth Soho; 78 %lung capacity. rtc prn   Saw hrt doctor about 11/2 to 2 years ago. Had chf. Doing fine since.     Activities of Daily Living:  Patient reports morning stiffness for 15 minutes.   Patient Reports nocturnal pain.  Difficulty dressing/grooming: Reports Difficulty climbing stairs: Reports Difficulty getting out of chair: Reports Difficulty using hands for taps, buttons, cutlery, and/or writing: Reports   Review of Systems  Constitutional: Negative for fatigue.  HENT: Negative for mouth sores and mouth dryness.   Eyes: Negative for dryness.  Respiratory: Negative for shortness of breath.   Gastrointestinal: Negative for constipation and diarrhea.  Musculoskeletal: Negative for myalgias and myalgias.  Skin: Negative for sensitivity to sunlight.  Neurological: Negative for memory loss.  Psychiatric/Behavioral: Negative for sleep disturbance.    PMFS History:  Patient Active Problem List   Diagnosis Date Noted  . Pleural effusion on left 06/02/2015  . Aneurysm of thoracic aorta ([place]) 06/02/2015  . SOB (shortness of breath) 08/22/2014  . Pericardial effusion 08/22/2014  . Lupus (systemic lupus erythematosus) ([place])   . 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 PE (pulmonary embolism) 06/05/2012    . Pleural effusion exudative 05/26/2012  . Essential hypertension 05/13/2012  . OSA on CPAP 05/13/2012  . Cardiomegaly 05/13/2012  . Abdominal aneurysm without mention of rupture 06/27/2011    Past Medical History:  Diagnosis Date  . AAA (abdominal aortic aneurysm) ([place])    a. resolved by ct scan 09-2011  . Anemia   . Arthritis   . Barrett's esophagus   . Carpal tunnel syndrome, bilateral   . Essential hypertension   . Fibromyalgia   . GERD (gastroesophageal reflux disease)   . History of pneumonia   . Hyperlipidemia   . Lupus (systemic lupus erythematosus) ([place])   . Nephrolithiasis   . Pericardial effusion    a. 08/22/2014 Echo: EF 60-65%, mildly dil Ao root (55mm), mildly dil LA, mod-large partially organized pericardial effusion with possible early tamponade - asymptomatic on high dose nsaids-->conservative mgmt;  b. 08/2014 Limited Echo: EF 55-60%, Triv - small effusion.  . Sleep apnea    a. uses cpap every night    Family History  Problem Relation Age of Onset  . Heart disease Father   . Hyperlipidemia Father   . Hypertension Father   . Other Father        VARICOSE VEIN  . Peripheral vascular disease Father   . Diabetes Sister    Past Surgical History:  Procedure Laterality Date  . APPENDECTOMY    . CARPAL TUNNEL RELEASE  10/12/2011   Procedure: CARPAL TUNNEL RELEASE;  Surgeon: Cammie Sickle., MD;  Location: [facility];  Service: Orthopedics;  Laterality: Left;  . CARPAL TUNNEL RELEASE  12/01/2011   Procedure: CARPAL TUNNEL RELEASE;  Surgeon: Cammie Sickle., MD;  Location: [place];  Service: Orthopedics;  Laterality: Right;  . CYSTOSCOPY W/ STONE MANIPULATION  [id number]  . HAND SURGERY     scar repaired lt hand  . HERNIA REPAIR  1966  . KNEE ARTHROPLASTY    . KNEE ARTHROSCOPY Left   . TONSILLECTOMY     Social History   Social History Narrative  . No narrative on file     Objective: Vital Signs: BP (!) 174/83 (BP  Location: Left Arm, Patient Position: Sitting, Cuff Size: Normal)   Pulse 61   Resp 18   Ht 5\' 9"  (1.753 m)   Wt 221 lb (100.2 kg)   BMI 32.64 kg/m    Physical Exam  Constitutional: He is oriented to person, place, and time. He appears well-developed and well-nourished.  HENT:  Head: Normocephalic and atraumatic.  Eyes: Pupils are equal, round, and reactive to light. Conjunctivae and EOM are normal.  Neck: Normal range of motion. Neck supple.  Cardiovascular: Normal rate, regular rhythm and normal heart sounds.  Exam reveals no gallop and no friction rub.   No murmur heard. Pulmonary/Chest: Effort normal and breath sounds normal. No respiratory distress. He has no wheezes. He has no rales. He exhibits no tenderness.  Abdominal: Soft. He exhibits no distension and no mass. There is no tenderness. There is no guarding.  Musculoskeletal: Normal range of motion.  Lymphadenopathy:    He has no cervical adenopathy.  Neurological: He is alert and oriented to person, place, and time. He exhibits normal muscle tone. Coordination normal.  Skin: Skin is warm and dry. Capillary refill takes less than 2 seconds. No rash noted.  Psychiatric: He has a normal mood and affect. His behavior is normal. Judgment and thought content normal.  Vitals reviewed.    Musculoskeletal Exam:  Full range of motion of all joints Grip strength is equal and strong bilaterally Fibromyalgia tender points are all absent  CDAI Exam: No CDAI exam completed.  No synovitis on examination  Investigation: No additional findings. No visits with results within 12 Month(s) from this visit.  Latest known visit with results is:  Hospital Outpatient Visit on 06/04/2015  Component Date Value Ref Range Status  . Specimen Description 06/04/2015 FLUID LEFT PLEURAL   Final  . Special Requests 06/04/2015 NONE   Final  . Gram Stain 06/04/2015    Final                   Value:ABUNDANT WBC PRESENT,BOTH PMN AND MONONUCLEAR NO  ORGANISMS SEEN Performed at [facility]   . Report Status 06/04/2015 06/05/2015 FINAL   Final  . Specimen Description 06/04/2015 FLUID LEFT PLEURAL   Final  . Special Requests 06/04/2015 BOTTLES DRAWN AEROBIC AND ANAEROBIC 5CC   Final  . Culture 06/04/2015    Final                   Value:NO GROWTH 5 DAYS Performed at [facility]   . Report Status 06/04/2015 06/09/2015 FINAL   Final     Imaging: No results found.     Lesion behind left ear with multiple colors but well circumscribed and papular. Patient has a dermatologist who he saw a year ago. He'll make another appointment as soon  as possible for a return visit. He will asked him to check out this lesion as well as other lesions that may be there to other parts of his body that they will have to discover.   Speciality Comments: No specialty comments available.    Procedures:  No procedures performed Allergies: Atorvastatin and Lipitor [atorvastatin calcium]   Assessment / Plan:     Visit Diagnoses: No diagnosis found.    Plan: #1: Systemic lupus erythematosus. No flare. Doing well. No fatigue, rash, joint pain.  #2: High risk prescription. Taking Plaquenil 20 mg twice a day every day. Patient's Plaquenil eye exam was done 2-3 months ago. At Dr. Aletha Halim office. Doing well. We do need documentation of his office visit. Patient states that he will get it for [place]. He states that the eye exam was normal  #3: Patient sees Dr.alva, pulmonologist. Mr. Broxson saw him about a year ago and exam was normal. He has 76% lung capacity. I've advised the patient to follow up with Dr.alva. He will have the pulmonologist and is a copy of that office visit to complete his chart.   #4: Cardiologist seen about 1-1/2-2 years ago. He saw him for CHF. CHF is doing fine at this time. I've advised the patient to follow with the cardiologist for an annual check and patient is agreeable. He will have them send a  copy of that office visit for office.  #5: CBC with differential, CMP with GFR, urinalysis, sedimentation rate, C3-C4, ANA with titer, double-stranded DNA will be done today.  #6: Return to clinic in 5 months  #7: Patient states that he has enough Plaquenil at this time (received it/refilled it 1 month ago. He does not need any Plaquenil at this time. Note prior to today's visit, his previous documentation is in [facility]. Therefore Epic does not show an updated Plaquenil prescription. It should say 200 mg twice a day every day.  Orders: No orders of the defined types were placed in this encounter.  No orders of the defined types were placed in this encounter.   Face-to-face time spent with patient was 40 minutes. 50% of time was spent in counseling and coordination of care.  Follow-Up Instructions: No Follow-up on file.   Eliezer Lofts, PA-C I examined and evaluated the patient with Eliezer Lofts PA. Patient's lupus appears to be quite well-controlled. He will continue on Plaquenil.He had no synovitis on my exam today. The lesion behind his ear appears to be SK lesion. He will see dermatologist.The plan of care was discussed as noted above.  Bo Merino, MD Note - This record has been created using Editor, commissioning.  Chart creation errors have been sought, but may not always  have been located. Such creation errors do not reflect on  the standard of medical care.

## 2016-12-13 ENCOUNTER — Other Ambulatory Visit: Payer: Self-pay | Admitting: *Deleted

## 2016-12-13 DIAGNOSIS — R300 Dysuria: Secondary | ICD-10-CM

## 2016-12-13 NOTE — Progress Notes (Signed)
Labs are stable. Urine showed 2+ protein. Repeat UA in one month

## 2016-12-15 LAB — ANTI-DNA ANTIBODY, DOUBLE-STRANDED: ds DNA Ab: 2 IU/mL

## 2016-12-15 LAB — COMPLETE METABOLIC PANEL WITH GFR
AG Ratio: 1.9 (calc) (ref 1.0–2.5)
ALBUMIN MSPROF: 4.2 g/dL (ref 3.6–5.1)
ALT: 15 U/L (ref 9–46)
AST: 19 U/L (ref 10–35)
Alkaline phosphatase (APISO): 36 U/L — ABNORMAL LOW (ref 40–115)
BUN: 16 mg/dL (ref 7–25)
CALCIUM: 9.2 mg/dL (ref 8.6–10.3)
CO2: 23 mmol/L (ref 20–32)
CREATININE: 0.8 mg/dL (ref 0.70–1.11)
Chloride: 110 mmol/L (ref 98–110)
GFR, EST AFRICAN AMERICAN: 98 mL/min/{1.73_m2} (ref >=60)
GFR, EST NON AFRICAN AMERICAN: 84 mL/min/{1.73_m2} (ref >=60)
GLOBULIN: 2.2 g/dL (ref 1.9–3.7)
Glucose, Bld: 142 mg/dL — ABNORMAL HIGH (ref 65–99)
Potassium: 4.1 mmol/L (ref 3.5–5.3)
SODIUM: 143 mmol/L (ref 135–146)
TOTAL PROTEIN: 6.4 g/dL (ref 6.1–8.1)
Total Bilirubin: 0.8 mg/dL (ref 0.2–1.2)

## 2016-12-15 LAB — URINALYSIS, ROUTINE W REFLEX MICROSCOPIC
BILIRUBIN URINE: NEGATIVE
Bacteria, UA: NONE SEEN /HPF
GLUCOSE, UA: NEGATIVE
HYALINE CAST: NONE SEEN /LPF
Ketones, ur: NEGATIVE
Leukocytes, UA: NEGATIVE
NITRITE: NEGATIVE
Specific Gravity, Urine: 1.024 (ref 1.001–1.03)
Squamous Epithelial / LPF: NONE SEEN /HPF (ref <=5)
WBC UA: NONE SEEN /HPF (ref 0–5)

## 2016-12-15 LAB — C3 AND C4
C3 Complement: 121 mg/dL (ref 82–185)
C4 COMPLEMENT: 20 mg/dL (ref 15–53)

## 2016-12-15 LAB — CBC WITH DIFFERENTIAL/PLATELET
Basophils Absolute: 42 cells/uL (ref 0–200)
Basophils Relative: 0.9 %
EOS PCT: 1.3 %
Eosinophils Absolute: 61 cells/uL (ref 15–500)
HEMATOCRIT: 40 % (ref 38.5–50.0)
HEMOGLOBIN: 13.8 g/dL (ref 13.2–17.1)
LYMPHS ABS: 1278 {cells}/uL (ref 850–3900)
MCH: 30.4 pg (ref 27.0–33.0)
MCHC: 34.5 g/dL (ref 32.0–36.0)
MCV: 88.1 fL (ref 80.0–100.0)
MPV: 10.8 fL (ref 7.5–12.5)
Monocytes Relative: 10.6 %
NEUTROS ABS: 2820 {cells}/uL (ref 1500–7800)
Neutrophils Relative %: 60 %
Platelets: 175 10*3/uL (ref 140–400)
RBC: 4.54 10*6/uL (ref 4.20–5.80)
RDW: 12.3 % (ref 11.0–15.0)
Total Lymphocyte: 27.2 %
WBC: 4.7 10*3/uL (ref 3.8–10.8)
WBCMIX: 498 {cells}/uL (ref 200–950)

## 2016-12-15 LAB — ANTI-NUCLEAR AB-TITER (ANA TITER)

## 2016-12-15 LAB — ANA: ANA: POSITIVE — AB

## 2016-12-15 LAB — SEDIMENTATION RATE: Sed Rate: 6 mm/h (ref 0–20)

## 2017-01-02 DIAGNOSIS — Z23 Encounter for immunization: Secondary | ICD-10-CM | POA: Diagnosis not present

## 2017-01-07 ENCOUNTER — Other Ambulatory Visit: Payer: Self-pay

## 2017-01-07 ENCOUNTER — Emergency Department (HOSPITAL_COMMUNITY): Payer: Medicare Other

## 2017-01-07 ENCOUNTER — Inpatient Hospital Stay (HOSPITAL_COMMUNITY)
Admission: EM | Admit: 2017-01-07 | Discharge: 2017-01-11 | DRG: 087 | Disposition: A | Discharge status: Skilled Nursing Facility | Payer: Medicare Other | Attending: General Surgery | Admitting: General Surgery

## 2017-01-07 ENCOUNTER — Encounter (HOSPITAL_COMMUNITY): Payer: Self-pay | Admitting: Emergency Medicine

## 2017-01-07 DIAGNOSIS — I714 Abdominal aortic aneurysm, without rupture: Secondary | ICD-10-CM | POA: Diagnosis present

## 2017-01-07 DIAGNOSIS — S0993XA Unspecified injury of face, initial encounter: Secondary | ICD-10-CM | POA: Diagnosis not present

## 2017-01-07 DIAGNOSIS — I10 Essential (primary) hypertension: Secondary | ICD-10-CM | POA: Diagnosis not present

## 2017-01-07 DIAGNOSIS — E785 Hyperlipidemia, unspecified: Secondary | ICD-10-CM | POA: Diagnosis present

## 2017-01-07 DIAGNOSIS — K219 Gastro-esophageal reflux disease without esophagitis: Secondary | ICD-10-CM | POA: Diagnosis present

## 2017-01-07 DIAGNOSIS — S299XXA Unspecified injury of thorax, initial encounter: Secondary | ICD-10-CM | POA: Diagnosis not present

## 2017-01-07 DIAGNOSIS — Y92008 Other place in unspecified non-institutional (private) residence as the place of occurrence of the external cause: Secondary | ICD-10-CM

## 2017-01-07 DIAGNOSIS — S065X0A Traumatic subdural hemorrhage without loss of consciousness, initial encounter: Secondary | ICD-10-CM

## 2017-01-07 DIAGNOSIS — S0990XA Unspecified injury of head, initial encounter: Secondary | ICD-10-CM | POA: Diagnosis not present

## 2017-01-07 DIAGNOSIS — S065X9A Traumatic subdural hemorrhage with loss of consciousness of unspecified duration, initial encounter: Secondary | ICD-10-CM | POA: Diagnosis present

## 2017-01-07 DIAGNOSIS — S065XAA Traumatic subdural hemorrhage with loss of consciousness status unknown, initial encounter: Secondary | ICD-10-CM | POA: Diagnosis present

## 2017-01-07 DIAGNOSIS — G4733 Obstructive sleep apnea (adult) (pediatric): Secondary | ICD-10-CM | POA: Diagnosis present

## 2017-01-07 DIAGNOSIS — Z86711 Personal history of pulmonary embolism: Secondary | ICD-10-CM | POA: Diagnosis not present

## 2017-01-07 DIAGNOSIS — S82831A Other fracture of upper and lower end of right fibula, initial encounter for closed fracture: Secondary | ICD-10-CM | POA: Diagnosis not present

## 2017-01-07 DIAGNOSIS — W132XXA Fall from, out of or through roof, initial encounter: Secondary | ICD-10-CM | POA: Diagnosis present

## 2017-01-07 DIAGNOSIS — M329 Systemic lupus erythematosus, unspecified: Secondary | ICD-10-CM | POA: Diagnosis present

## 2017-01-07 DIAGNOSIS — Z23 Encounter for immunization: Secondary | ICD-10-CM

## 2017-01-07 DIAGNOSIS — M797 Fibromyalgia: Secondary | ICD-10-CM | POA: Diagnosis present

## 2017-01-07 DIAGNOSIS — S0003XA Contusion of scalp, initial encounter: Secondary | ICD-10-CM | POA: Diagnosis not present

## 2017-01-07 DIAGNOSIS — S62617A Displaced fracture of proximal phalanx of left little finger, initial encounter for closed fracture: Secondary | ICD-10-CM | POA: Diagnosis present

## 2017-01-07 DIAGNOSIS — M79672 Pain in left foot: Secondary | ICD-10-CM | POA: Diagnosis not present

## 2017-01-07 DIAGNOSIS — M25572 Pain in left ankle and joints of left foot: Secondary | ICD-10-CM | POA: Diagnosis not present

## 2017-01-07 DIAGNOSIS — M79642 Pain in left hand: Secondary | ICD-10-CM

## 2017-01-07 DIAGNOSIS — S82433A Displaced oblique fracture of shaft of unspecified fibula, initial encounter for closed fracture: Secondary | ICD-10-CM | POA: Diagnosis not present

## 2017-01-07 DIAGNOSIS — R40241 Glasgow coma scale score 13-15, unspecified time: Secondary | ICD-10-CM | POA: Diagnosis present

## 2017-01-07 DIAGNOSIS — S199XXA Unspecified injury of neck, initial encounter: Secondary | ICD-10-CM | POA: Diagnosis not present

## 2017-01-07 LAB — BASIC METABOLIC PANEL
Anion gap: 9 (ref 5–15)
BUN: 19 mg/dL (ref 6–20)
CHLORIDE: 109 mmol/L (ref 101–111)
CO2: 25 mmol/L (ref 22–32)
Calcium: 9.4 mg/dL (ref 8.9–10.3)
Creatinine, Ser: 0.93 mg/dL (ref 0.61–1.24)
GFR calc non Af Amer: >60 mL/min (ref >60)
GLUCOSE: 147 mg/dL — AB (ref 65–99)
Potassium: 3.7 mmol/L (ref 3.5–5.1)
Sodium: 143 mmol/L (ref 135–145)

## 2017-01-07 LAB — CBC
HCT: 39.4 % (ref 39.0–52.0)
Hemoglobin: 13.4 g/dL (ref 13.0–17.0)
MCH: 30.1 pg (ref 26.0–34.0)
MCHC: 34 g/dL (ref 30.0–36.0)
MCV: 88.5 fL (ref 78.0–100.0)
PLATELETS: 180 10*3/uL (ref 150–400)
RBC: 4.45 MIL/uL (ref 4.22–5.81)
RDW: 12.7 % (ref 11.5–15.5)
WBC: 12.7 10*3/uL — ABNORMAL HIGH (ref 4.0–10.5)

## 2017-01-07 LAB — TYPE AND SCREEN
ABO/RH(D): A POS
ANTIBODY SCREEN: NEGATIVE

## 2017-01-07 LAB — PROTIME-INR
INR: 1.1
PROTHROMBIN TIME: 14.1 s (ref 11.4–15.2)

## 2017-01-07 MED ORDER — TETANUS-DIPHTH-ACELL PERTUSSIS 5-2.5-18.5 LF-MCG/0.5 IM SUSP
0.5000 mL | Freq: Once | INTRAMUSCULAR | Status: AC
  Administered 2017-01-07: 0.5 mL via INTRAMUSCULAR
  Filled 2017-01-07: qty 0.5

## 2017-01-07 MED ORDER — NICARDIPINE HCL IN NACL 20-0.86 MG/200ML-% IV SOLN
0.0000 mg/h | INTRAVENOUS | Status: DC
  Administered 2017-01-07: 5 mg/h via INTRAVENOUS
  Administered 2017-01-08: 10 mg/h via INTRAVENOUS
  Administered 2017-01-08: 12 mg/h via INTRAVENOUS
  Administered 2017-01-08: 10 mg/h via INTRAVENOUS
  Administered 2017-01-08: 6 mg/h via INTRAVENOUS
  Administered 2017-01-08: 12 mg/h via INTRAVENOUS
  Filled 2017-01-07 (×6): qty 200

## 2017-01-07 MED ORDER — LABETALOL HCL 5 MG/ML IV SOLN
10.0000 mg | Freq: Once | INTRAVENOUS | Status: AC
  Administered 2017-01-07: 10 mg via INTRAVENOUS
  Filled 2017-01-07: qty 4

## 2017-01-07 NOTE — ED Notes (Signed)
Patient transported to X-ray 

## 2017-01-07 NOTE — ED Triage Notes (Signed)
Pt reports he fell off a 12' ladder when he was transitioning from the roof onto his ladder. Pt fell onto the ground when his ladder fell. Pt has a large hematoma to his R forehead. No LOC. Pt on aspirin, but no blood thinners.

## 2017-01-07 NOTE — Progress Notes (Signed)
Head CT reviewed. This is a non operative subdural, which does not pose a risk. The basal cisterns are patent, there is no midline shift.  Call if there are questions.

## 2017-01-07 NOTE — ED Provider Notes (Signed)
Transferred from [facility] emergency department for further care by trauma surgery.  Patient had a fall earlier tonight and was identified as having a traumatic subarachnoid hemorrhage, significant hypertension and a right leg injury.  He was transferred for monitoring for subarachnoid hemorrhage.  Blood pressure is improved on the Cardene drip.  He reports 2 out of 10 pain currently, awake, alert and oriented.  We will contact trauma surgery for admission.     Orpah Greek, MD 01/07/17 2358

## 2017-01-07 NOTE — ED Notes (Signed)
Dr.Little notified of bp 185/91 after start of Cardine drip at 5mg /hr.

## 2017-01-07 NOTE — ED Provider Notes (Signed)
[place] DEPT Provider Note   CSN: [id number] Arrival date & time: 01/07/17  1851     History   Chief Complaint Chief Complaint  Patient presents with  . Head Injury  . Fall    HPI Tyler Norris is a 82 y.o. male.  82yo M w/ PMH including SLE, HTN, HLD, OSA who p/w fall and head injury. Just PTA, pt was trying to climb onto a ladder from a roof when the ladder fell, causing him to fall to the ground. He fell ~10', struck front of head, denies LOC. He eventually stood up ~10 min after fall. He reports constant, moderate pain in forehead and nasal congestion, mild pain in R neck that is currently resolved, R lateral ankle pain and L medial ankle pain. No chest, abdominal, back, hip, or upper extremity pain. No vomiting.  No anticoagulant use.  Unknown last tetanus.  No breathing problems.   The history is provided by the patient.  Head Injury    Fall     Past Medical History:  Diagnosis Date  . AAA (abdominal aortic aneurysm) ([place])    a. resolved by ct scan 09-2011  . Anemia   . Arthritis   . Barrett's esophagus   . Carpal tunnel syndrome, bilateral   . Essential hypertension   . Fibromyalgia   . GERD (gastroesophageal reflux disease)   . History of pneumonia   . Hyperlipidemia   . Lupus (systemic lupus erythematosus) ([place])   . Nephrolithiasis   . Pericardial effusion    a. 08/22/2014 Echo: EF 60-65%, mildly dil Ao root (84mm), mildly dil LA, mod-large partially organized pericardial effusion with possible early tamponade - asymptomatic on high dose nsaids-->conservative mgmt;  b. 08/2014 Limited Echo: EF 55-60%, Triv - small effusion.  . Sleep apnea    a. uses cpap every night    Patient Active Problem List   Diagnosis Date Noted  . Pleural effusion on left 06/02/2015  . Aneurysm of thoracic aorta ([place]) 06/02/2015  . SOB (shortness of breath) 08/22/2014  . Pericardial effusion 08/22/2014  . Lupus (systemic lupus  erythematosus) ([place])   . 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 PE (pulmonary embolism) 06/05/2012  . Pleural effusion exudative 05/26/2012  . Essential hypertension 05/13/2012  . OSA on CPAP 05/13/2012  . Cardiomegaly 05/13/2012  . Abdominal aneurysm without mention of rupture 06/27/2011    Past Surgical History:  Procedure Laterality Date  . APPENDECTOMY    . CYSTOSCOPY W/ STONE MANIPULATION  [id number]  . HAND SURGERY     scar repaired lt hand  . HERNIA REPAIR  1966  . KNEE ARTHROPLASTY    . KNEE ARTHROSCOPY Left   . TONSILLECTOMY         Home Medications    Prior to Admission medications   Medication Sig Start Date End Date Taking? Authorizing Provider  aspirin 81 MG tablet Take 81 mg by mouth daily.   Yes [provider]  Bioflavonoid Products (BIOFLEX PO) Take 2 tablets by mouth daily.    Yes [provider]  CALCIUM PO Take 1 tablet daily by mouth.   Yes [provider]  Cholecalciferol (VITAMIN D3) 5000 UNITS CAPS Take 5,000 Units by mouth daily.   Yes [provider]  esomeprazole (NEXIUM) 40 MG capsule Take 40 mg by mouth daily at 12 noon.   Yes [provider]  fenofibrate (TRICOR) 145 MG tablet Take 145 mg by mouth daily.   Yes [provider]  ferrous sulfate 325 (65 FE) MG tablet Take 325 mg by mouth daily with breakfast.   Yes [provider]  folic acid (FOLVITE) 784 MCG tablet Take 400 mcg by mouth daily.   Yes [provider]  hydroxychloroquine (PLAQUENIL) 200 MG tablet Take 200 mg by mouth 2 (two) times daily.   Yes [provider]  hydroxypropyl methylcellulose / hypromellose (ISOPTO TEARS / GONIOVISC) 2.5 % ophthalmic solution Place 1 drop as needed into both eyes for dry eyes.   Yes [provider]  magnesium oxide (MAG-OX) 400 (241.3 MG) MG tablet Take 1 tablet (400 mg total) by mouth 3 (three) times daily. 09/02/14  Yes Rogelia Mire, NP  Multiple  Vitamin (MULTIVITAMIN WITH MINERALS) TABS Take 1 tablet by mouth daily.   Yes [provider]  Omega-3 Fatty Acids (FISH OIL) 1200 MG CAPS Take 1,200 mg by mouth daily.    Yes [provider]  rosuvastatin (CRESTOR) 5 MG tablet Take 2.5 mg by mouth daily.    Yes [provider]  vitamin C (ASCORBIC ACID) 500 MG tablet Take 1,000 mg by mouth daily.    Yes [provider]  vitamin E 400 UNIT capsule Take 400 Units by mouth daily.   Yes [provider]    Family History Family History  Problem Relation Age of Onset  . Heart disease Father   . Hyperlipidemia Father   . Hypertension Father   . Other Father        VARICOSE VEIN  . Peripheral vascular disease Father   . Diabetes Sister     Social History Social History   Tobacco Use  . Smoking status: Never Smoker  . Smokeless tobacco: Never Used  Substance Use Topics  . Alcohol use: No  . Drug use: No     Allergies   Atorvastatin and Lipitor [atorvastatin calcium]   Review of Systems Review of Systems All other systems reviewed and are negative except that which was mentioned in HPI   Physical Exam Updated Vital Signs BP (!) 185/91 (BP Location: Left Arm)   Pulse 86   Temp 98.7 F (37.1 C) (Oral)   Resp 19   Ht 5\' 9"  (1.753 m)   Wt 97.5 kg (215 lb)   SpO2 95%   BMI 31.75 kg/m   Physical Exam  Constitutional: He is oriented to person, place, and time. He appears well-developed and well-nourished. No distress.  HENT:  Head: Normocephalic.  Mouth/Throat: Oropharynx is clear and moist.  Large R forehead hematoma with abrasion, abrasion nasal bridge with mild swelling  Eyes: Conjunctivae and EOM are normal. Pupils are equal, round, and reactive to light.  Neck:  In c-collar  Cardiovascular: Normal rate, regular rhythm, normal heart sounds and intact distal pulses.  No murmur heard. Pulmonary/Chest: Effort normal and breath sounds normal. He exhibits no tenderness.    Abdominal: Soft. Bowel sounds are normal. He exhibits no distension. There is no tenderness.  Musculoskeletal: Normal range of motion.  Mild edema R lateral ankle just above malleolus; mild tenderness L medial malleolus  Neurological: He is alert and oriented to person, place, and time.  Fluent speech  Skin: Skin is warm and dry.  Psychiatric: He has a normal mood and affect. Judgment normal.  Nursing note and vitals reviewed.    ED Treatments / Results  Labs (all labs ordered are listed, but only abnormal results are displayed) Labs Reviewed  BASIC METABOLIC PANEL - Abnormal; Notable for  the following components:      Result Value   Glucose, Bld 147 (*)    All other components within normal limits  CBC - Abnormal; Notable for the following components:   WBC 12.7 (*)    All other components within normal limits  PROTIME-INR  TYPE AND SCREEN    EKG  EKG Interpretation None       Radiology Dg Chest 2 View  Result Date: 01/07/2017 CLINICAL DATA:  Fall from roof. EXAM: CHEST  2 VIEW COMPARISON:  11/09/2015 FINDINGS: Lungs are adequately inflated without focal consolidation, effusion or pneumothorax. Cardiomediastinal silhouette is within normal. There is mild degenerate change of the spine and shoulders. No acute fracture. IMPRESSION: No acute findings. Electronically Signed   By: Marin Olp M.D.   On: 01/07/2017 21:46   Dg Ankle Complete Left  Result Date: 01/07/2017 CLINICAL DATA:  82 y/o M; fall from roof with left medial ankle pain. EXAM: LEFT ANKLE COMPLETE - 3+ VIEW COMPARISON:  None. FINDINGS: No acute fracture or dislocation identified. Small lucencies within the medial and lateral corners of talar dome may represent fibrocystic degenerative changes or osteochondral injury. Ankle mortise is symmetric on these nonstress views. Small well corticated ossific density adjacent to medial malleolus compatible with sequelae of old trauma. Plantar calcaneal enthesophyte.  Vascular calcifications. Pes plans. IMPRESSION: 1. No acute fracture or dislocation identified. 2. Small lucencies within medial and lateral corners of talar dome may represent fibrocystic degenerative changes or osteochondral injury. 3. Well corticated calcific density adjacent to medial malleolus compatible with old trauma. 4. Plantar calcaneal bone spur. 5. Pes planus. Electronically Signed   By: Kristine Garbe M.D.   On: 01/07/2017 21:43   Dg Ankle Complete Right  Result Date: 01/07/2017 CLINICAL DATA:  Fall off roof with bilateral ankle pain and swelling. EXAM: RIGHT ANKLE - COMPLETE 3+ VIEW COMPARISON:  None. FINDINGS: There is a minimally displaced oblique fracture of the distal fibular diametaphyseal region just above the ankle mortise. Ankle mortise is within normal. Moderate size inferior calcaneal spur is present. There is mild degenerative change over the midfoot/hindfoot and tibiotalar joint. Small vessel atherosclerotic disease is present. IMPRESSION: Minimally displaced oblique fracture of the distal fibula just above the ankle mortise. Electronically Signed   By: Marin Olp M.D.   On: 01/07/2017 21:45    Procedures .Critical Care Performed by: Sharlett Iles, MD Authorized by: Sharlett Iles, MD   Critical care provider statement:    Critical care time (minutes):  45   Critical care time was exclusive of:  Separately billable procedures and treating other patients   Critical care was necessary to treat or prevent imminent or life-threatening deterioration of the following conditions:  Trauma   Critical care was time spent personally by me on the following activities:  Development of treatment plan with patient or surrogate, discussions with consultants, evaluation of patient's response to treatment, examination of patient, obtaining history from patient or surrogate, ordering and performing treatments and interventions, ordering and review of laboratory  studies, ordering and review of radiographic studies and re-evaluation of patient's condition   (including critical care time)  Medications Ordered in ED Medications  nicardipine (CARDENE) 20mg  in 0.86% saline 245ml IV infusion (0.1 mg/ml) (5 mg/hr Intravenous New Bag/Given 01/07/17 2234)  Tdap (BOOSTRIX) injection 0.5 mL (0.5 mLs Intramuscular Given 01/07/17 2022)  labetalol (NORMODYNE,TRANDATE) injection 10 mg (10 mg Intravenous Given 01/07/17 2117)     Initial Impression / Assessment and Plan / ED  Course  I have reviewed the triage vital signs and the nursing notes.  Pertinent labs & imaging results that were available during my care of the patient were reviewed by me and considered in my medical decision making (see chart for details).     Fall off ladder ~10', neuro intact, VS notable for BP 209/101 initially. Injuries noted as above. Obtained imaging of head, neck, chest, ankles.   Head CT shows a small acute subdural hemorrhage without midline shift, discussed findings with radiologist.  Contacted neurosurgery, Dr. Herold Harms, who stated that the patient does not need any acute neurosurgical intervention but agreed with blood pressure management.  Plain films show right distal fibula fracture.  Placed patient in splint and instructed that he will need to follow-up with his orthopedist at [place] within 1 week.  Initiated a nicardipine drip for his severe hypertension.  Discussed admission with trauma surgeon, Dr. Kieth Brightly, who has accepted the patient as an ED to ED transfer at [facility].  Discussed transfer with Dr. Tomi Bamberger. Pt sent to [facility] for trauma admission.  Final Clinical Impressions(s) / ED Diagnoses   Final diagnoses:  Traumatic subdural hemorrhage without loss of consciousness, initial encounter [facility])  Closed fracture of distal end of right fibula, unspecified fracture morphology, initial encounter  Essential hypertension    ED Discharge Orders    None        Melane Windholz, Wenda Overland, MD 01/07/17 2255

## 2017-01-07 NOTE — ED Notes (Signed)
[facility] here for transfer

## 2017-01-08 ENCOUNTER — Inpatient Hospital Stay (HOSPITAL_COMMUNITY): Payer: Medicare Other

## 2017-01-08 DIAGNOSIS — S82831A Other fracture of upper and lower end of right fibula, initial encounter for closed fracture: Secondary | ICD-10-CM | POA: Diagnosis not present

## 2017-01-08 DIAGNOSIS — R2689 Other abnormalities of gait and mobility: Secondary | ICD-10-CM | POA: Diagnosis not present

## 2017-01-08 DIAGNOSIS — S065X9A Traumatic subdural hemorrhage with loss of consciousness of unspecified duration, initial encounter: Secondary | ICD-10-CM | POA: Diagnosis not present

## 2017-01-08 DIAGNOSIS — E569 Vitamin deficiency, unspecified: Secondary | ICD-10-CM | POA: Diagnosis not present

## 2017-01-08 DIAGNOSIS — S62641A Nondisplaced fracture of proximal phalanx of left index finger, initial encounter for closed fracture: Secondary | ICD-10-CM | POA: Diagnosis not present

## 2017-01-08 DIAGNOSIS — K5900 Constipation, unspecified: Secondary | ICD-10-CM | POA: Diagnosis not present

## 2017-01-08 DIAGNOSIS — M79642 Pain in left hand: Secondary | ICD-10-CM | POA: Diagnosis not present

## 2017-01-08 DIAGNOSIS — S5290XA Unspecified fracture of unspecified forearm, initial encounter for closed fracture: Secondary | ICD-10-CM | POA: Diagnosis not present

## 2017-01-08 DIAGNOSIS — S065XAA Traumatic subdural hemorrhage with loss of consciousness status unknown, initial encounter: Secondary | ICD-10-CM | POA: Diagnosis present

## 2017-01-08 DIAGNOSIS — E785 Hyperlipidemia, unspecified: Secondary | ICD-10-CM | POA: Diagnosis present

## 2017-01-08 DIAGNOSIS — K219 Gastro-esophageal reflux disease without esophagitis: Secondary | ICD-10-CM | POA: Diagnosis present

## 2017-01-08 DIAGNOSIS — H04129 Dry eye syndrome of unspecified lacrimal gland: Secondary | ICD-10-CM | POA: Diagnosis not present

## 2017-01-08 DIAGNOSIS — S065X0D Traumatic subdural hemorrhage without loss of consciousness, subsequent encounter: Secondary | ICD-10-CM | POA: Diagnosis not present

## 2017-01-08 DIAGNOSIS — R278 Other lack of coordination: Secondary | ICD-10-CM | POA: Diagnosis not present

## 2017-01-08 DIAGNOSIS — S065X0A Traumatic subdural hemorrhage without loss of consciousness, initial encounter: Secondary | ICD-10-CM | POA: Diagnosis not present

## 2017-01-08 DIAGNOSIS — W11XXXA Fall on and from ladder, initial encounter: Secondary | ICD-10-CM | POA: Diagnosis not present

## 2017-01-08 DIAGNOSIS — G4733 Obstructive sleep apnea (adult) (pediatric): Secondary | ICD-10-CM | POA: Diagnosis present

## 2017-01-08 DIAGNOSIS — M79672 Pain in left foot: Secondary | ICD-10-CM | POA: Diagnosis not present

## 2017-01-08 DIAGNOSIS — R40241 Glasgow coma scale score 13-15, unspecified time: Secondary | ICD-10-CM | POA: Diagnosis present

## 2017-01-08 DIAGNOSIS — S82431D Displaced oblique fracture of shaft of right fibula, subsequent encounter for closed fracture with routine healing: Secondary | ICD-10-CM | POA: Diagnosis not present

## 2017-01-08 DIAGNOSIS — Z111 Encounter for screening for respiratory tuberculosis: Secondary | ICD-10-CM | POA: Diagnosis not present

## 2017-01-08 DIAGNOSIS — Y92008 Other place in unspecified non-institutional (private) residence as the place of occurrence of the external cause: Secondary | ICD-10-CM | POA: Diagnosis not present

## 2017-01-08 DIAGNOSIS — M797 Fibromyalgia: Secondary | ICD-10-CM | POA: Diagnosis present

## 2017-01-08 DIAGNOSIS — S62617A Displaced fracture of proximal phalanx of left little finger, initial encounter for closed fracture: Secondary | ICD-10-CM | POA: Diagnosis present

## 2017-01-08 DIAGNOSIS — M329 Systemic lupus erythematosus, unspecified: Secondary | ICD-10-CM | POA: Diagnosis present

## 2017-01-08 DIAGNOSIS — W132XXA Fall from, out of or through roof, initial encounter: Secondary | ICD-10-CM | POA: Diagnosis not present

## 2017-01-08 DIAGNOSIS — I10 Essential (primary) hypertension: Secondary | ICD-10-CM | POA: Diagnosis not present

## 2017-01-08 DIAGNOSIS — Z23 Encounter for immunization: Secondary | ICD-10-CM | POA: Diagnosis not present

## 2017-01-08 DIAGNOSIS — S0990XA Unspecified injury of head, initial encounter: Secondary | ICD-10-CM | POA: Diagnosis not present

## 2017-01-08 DIAGNOSIS — S82401A Unspecified fracture of shaft of right fibula, initial encounter for closed fracture: Secondary | ICD-10-CM | POA: Diagnosis not present

## 2017-01-08 DIAGNOSIS — I714 Abdominal aortic aneurysm, without rupture: Secondary | ICD-10-CM | POA: Diagnosis present

## 2017-01-08 DIAGNOSIS — Z86711 Personal history of pulmonary embolism: Secondary | ICD-10-CM | POA: Diagnosis not present

## 2017-01-08 DIAGNOSIS — Z9181 History of falling: Secondary | ICD-10-CM | POA: Diagnosis not present

## 2017-01-08 LAB — BASIC METABOLIC PANEL
Anion gap: 9 (ref 5–15)
BUN: 18 mg/dL (ref 6–20)
CHLORIDE: 107 mmol/L (ref 101–111)
CO2: 24 mmol/L (ref 22–32)
CREATININE: 1.01 mg/dL (ref 0.61–1.24)
Calcium: 8.9 mg/dL (ref 8.9–10.3)
GFR calc Af Amer: >60 mL/min (ref >60)
GFR calc non Af Amer: >60 mL/min (ref >60)
Glucose, Bld: 159 mg/dL — ABNORMAL HIGH (ref 65–99)
Potassium: 3.9 mmol/L (ref 3.5–5.1)
SODIUM: 140 mmol/L (ref 135–145)

## 2017-01-08 LAB — TYPE AND SCREEN
ABO/RH(D): A POS
Antibody Screen: NEGATIVE

## 2017-01-08 LAB — CBC
HEMATOCRIT: 38 % — AB (ref 39.0–52.0)
HEMOGLOBIN: 12.9 g/dL — AB (ref 13.0–17.0)
MCH: 30 pg (ref 26.0–34.0)
MCHC: 33.9 g/dL (ref 30.0–36.0)
MCV: 88.4 fL (ref 78.0–100.0)
Platelets: 202 10*3/uL (ref 150–400)
RBC: 4.3 MIL/uL (ref 4.22–5.81)
RDW: 12.8 % (ref 11.5–15.5)
WBC: 12.8 10*3/uL — ABNORMAL HIGH (ref 4.0–10.5)

## 2017-01-08 LAB — MRSA PCR SCREENING: MRSA BY PCR: NEGATIVE

## 2017-01-08 LAB — ABO/RH
ABO/RH(D): A POS
ABO/RH(D): A POS

## 2017-01-08 MED ORDER — LABETALOL HCL 5 MG/ML IV SOLN: 10.0000 mg | INTRAVENOUS | Status: DC | PRN

## 2017-01-08 MED ORDER — CALCIUM CARBONATE-VITAMIN D 500-200 MG-UNIT PO TABS
1.0000 | ORAL_TABLET | Freq: Every day | ORAL | Status: DC
  Administered 2017-01-08 – 2017-01-11 (×4): 1 via ORAL
  Filled 2017-01-08 (×4): qty 1

## 2017-01-08 MED ORDER — HYDRALAZINE HCL 20 MG/ML IJ SOLN
5.0000 mg | INTRAMUSCULAR | Status: DC | PRN
  Administered 2017-01-09 – 2017-01-10 (×4): 5 mg via INTRAVENOUS
  Filled 2017-01-08 (×4): qty 1

## 2017-01-08 MED ORDER — DOCUSATE SODIUM 100 MG PO CAPS
100.0000 mg | ORAL_CAPSULE | Freq: Two times a day (BID) | ORAL | Status: DC
  Administered 2017-01-08 – 2017-01-11 (×6): 100 mg via ORAL
  Filled 2017-01-08 (×6): qty 1

## 2017-01-08 MED ORDER — PANTOPRAZOLE SODIUM 40 MG PO TBEC
40.0000 mg | DELAYED_RELEASE_TABLET | Freq: Every day | ORAL | Status: DC
  Administered 2017-01-08 – 2017-01-11 (×4): 40 mg via ORAL
  Filled 2017-01-08 (×4): qty 1

## 2017-01-08 MED ORDER — OXYCODONE HCL 5 MG PO TABS: 5.0000 mg | ORAL_TABLET | ORAL | Status: DC | PRN

## 2017-01-08 MED ORDER — ROSUVASTATIN CALCIUM 5 MG PO TABS
2.5000 mg | ORAL_TABLET | Freq: Every day | ORAL | Status: DC
  Administered 2017-01-08 – 2017-01-11 (×4): 2.5 mg via ORAL
  Filled 2017-01-08: qty 0.5
  Filled 2017-01-08 (×3): qty 1

## 2017-01-08 MED ORDER — METOPROLOL TARTRATE 12.5 MG HALF TABLET
12.5000 mg | ORAL_TABLET | Freq: Two times a day (BID) | ORAL | Status: DC
  Administered 2017-01-08 – 2017-01-11 (×7): 12.5 mg via ORAL
  Filled 2017-01-08 (×7): qty 1

## 2017-01-08 MED ORDER — ONDANSETRON 4 MG PO TBDP: 4.0000 mg | ORAL_TABLET | Freq: Four times a day (QID) | ORAL | Status: DC | PRN

## 2017-01-08 MED ORDER — HYDROXYCHLOROQUINE SULFATE 200 MG PO TABS
200.0000 mg | ORAL_TABLET | Freq: Two times a day (BID) | ORAL | Status: DC
  Administered 2017-01-08 – 2017-01-11 (×7): 200 mg via ORAL
  Filled 2017-01-08 (×9): qty 1

## 2017-01-08 MED ORDER — ACETAMINOPHEN 500 MG PO TABS
1000.0000 mg | ORAL_TABLET | Freq: Three times a day (TID) | ORAL | Status: DC
  Administered 2017-01-08 – 2017-01-11 (×7): 1000 mg via ORAL
  Filled 2017-01-08 (×9): qty 2

## 2017-01-08 MED ORDER — DEXTROSE-NACL 5-0.45 % IV SOLN
INTRAVENOUS | Status: DC
  Administered 2017-01-08: 03:00:00 via INTRAVENOUS

## 2017-01-08 MED ORDER — ADULT MULTIVITAMIN W/MINERALS CH
1.0000 | ORAL_TABLET | Freq: Every day | ORAL | Status: DC
  Administered 2017-01-08 – 2017-01-11 (×4): 1 via ORAL
  Filled 2017-01-08 (×4): qty 1

## 2017-01-08 MED ORDER — ONDANSETRON HCL 4 MG/2ML IJ SOLN: 4.0000 mg | Freq: Four times a day (QID) | INTRAMUSCULAR | Status: DC | PRN

## 2017-01-08 MED ORDER — METOPROLOL TARTRATE 5 MG/5ML IV SOLN
5.0000 mg | Freq: Four times a day (QID) | INTRAVENOUS | Status: DC | PRN
  Administered 2017-01-09: 5 mg via INTRAVENOUS
  Filled 2017-01-08: qty 5

## 2017-01-08 MED ORDER — ACETAMINOPHEN 325 MG PO TABS: 650.0000 mg | ORAL_TABLET | ORAL | Status: DC | PRN

## 2017-01-08 MED ORDER — MORPHINE SULFATE (PF) 4 MG/ML IV SOLN: 2.0000 mg | INTRAVENOUS | Status: DC | PRN

## 2017-01-08 MED ORDER — TRAMADOL HCL 50 MG PO TABS
50.0000 mg | ORAL_TABLET | Freq: Two times a day (BID) | ORAL | Status: DC
  Administered 2017-01-08 – 2017-01-11 (×6): 50 mg via ORAL
  Filled 2017-01-08 (×6): qty 1

## 2017-01-08 NOTE — Evaluation (Signed)
Occupational Therapy Evaluation Patient Details Name: Tyler Norris MRN: [id number] DOB: 1934/03/20 Today's Date: 01/08/2017    History of Present Illness 82 yo male getting down from roof fell when ladder slipped. Pt admitted with SDH, Rt ankle fx, left 5th phalanx fx. PMHx: Lupus, AAA, fibromyalgia, HTN, arthritis   Clinical Impression   PTA, pt was living alone and was independent. Pt currently requiring Min A for UB ADLs, Max A for LB ADLs, and Max A +2 for sit<>stand. Pt would benefit form further acute OT to facilitate safe dc. Pt presenting with decreased cognition, balance, and recall of precautions. Recommend dc to SNF for further OT to increase safety and independence with ADLs and functional mobility.     Follow Up Recommendations  SNF    Equipment Recommendations  Other (comment)(Defer to next venue)    Recommendations for Other Services PT consult     Precautions / Restrictions Precautions Precautions: Fall Restrictions Weight Bearing Restrictions: Yes LUE Weight Bearing: Weight bear through elbow only RLE Weight Bearing: Non weight bearing      Mobility Bed Mobility Overal bed mobility: Needs Assistance Bed Mobility: Supine to Sit     Supine to sit: Supervision     General bed mobility comments: cues for sequence and to not push with LUE, with HOB 20 degrees and increased time  Transfers Overall transfer level: Needs assistance   Transfers: Lateral/Scoot Transfers;Sit to/from [employer] Sit to Stand: Max assist;+2 physical assistance        Lateral/Scoot Transfers: Min assist;+2 safety/equipment General transfer comment: attempted to have pt perform squat pivot from bed to chair toward right with pt unable to maintain weight off of RLE even with P.T. foot under his foot and max cues with assist. Transitioned to lateral scoot transfer with drop arm recliner and pt able to perform with min assist and cues for sequence. In chair pt  able to stand with bil UE assist, RLE on PT food and increased time and assist to rise    Balance Overall balance assessment: Needs assistance Sitting-balance support: No upper extremity supported;Feet supported Sitting balance-Leahy Scale: Fair     Standing balance support: Bilateral upper extremity supported;During functional activity Standing balance-Leahy Scale: Poor                             ADL either performed or assessed with clinical judgement   ADL Overall ADL's : Needs assistance/impaired Eating/Feeding: Sitting;Minimal assistance   Grooming: Minimal assistance;Sitting   Upper Body Bathing: Minimal assistance;Sitting   Lower Body Bathing: Maximal assistance;+2 for physical assistance;Sit to/from stand   Upper Body Dressing : Minimal assistance;Sitting   Lower Body Dressing: Maximal assistance;Sit to/from stand               Functional mobility during ADLs: Maximal assistance;+2 for physical assistance(sit<>Stand only) General ADL Comments: Pt with decreased fuctional performance impacted by decreased balance, cogntition, and understanding of precautions.     Vision Patient Visual Report: Other (comment)(Pt reporting blurry vision in upper R visual field) Vision Assessment?: Vision impaired- to be further tested in functional context     Perception     Praxis      Pertinent Vitals/Pain Pain Assessment: Faces Faces Pain Scale: Hurts little more Pain Location: Face, L hand, and R leg Pain Descriptors / Indicators: Constant;Discomfort;Grimacing Pain Intervention(s): Monitored during session;Repositioned;Ice applied     Hand Dominance Right   Extremity/Trunk Assessment Upper Extremity Assessment Upper  Extremity Assessment: LUE deficits/detail LUE Deficits / Details: LUE fx and only allowed to WB through elbow. Elbow and shoulder WFL LUE: Unable to fully assess due to immobilization LUE Coordination: decreased fine motor   Lower  Extremity Assessment Lower Extremity Assessment: Defer to PT evaluation RLE Deficits / Details: unable to fully assess due to splinting and NWB status  LLE Deficits / Details: decreased strength and difficulty pushing through LLE for transfers grossly 3+/5   Cervical / Trunk Assessment Cervical / Trunk Assessment: Other exceptions Cervical / Trunk Exceptions: rounded shoulders, forward head   Communication Communication Communication: No difficulties   Cognition Arousal/Alertness: Awake/alert Behavior During Therapy: WFL for tasks assessed/performed Overall Cognitive Status: Impaired/Different from baseline Area of Impairment: Safety/judgement;Attention;Problem solving;Awareness;Memory                   Current Attention Level: Sustained Memory: Decreased recall of precautions;Decreased short-term memory   Safety/Judgement: Decreased awareness of deficits;Decreased awareness of safety Awareness: Emergent Problem Solving: Slow processing;Requires verbal cues;Difficulty sequencing General Comments: Demonstrating decreased cogntition with poor memory of precautions and decreased awareness. Pt able to follow simple commands. Wil lassess cognittion further at next session. Pt with poor problem solving and awarness of deficits. Pt reporting that he will go home even if it means he needs to crawl on the floor. Educated pt on dc options and recommendation; pt verbalized understanding.   General Comments  Pt ex-wife present thorughout session. BP before mobility 123/73; after transfer 147/73. SpO2 in 90s throughout session on roomair    Exercises     Shoulder Instructions      Home Living Family/patient expects to be discharged to:: Private residence Living Arrangements: Alone   Type of Home: House Home Access: Stairs to enter Technical brewer of Steps: 2 Entrance Stairs-Rails: None Home Layout: One level     Bathroom Shower/Tub: Occupational psychologist:  Standard     Home Equipment: Grab bars - tub/shower;Walker - 4 wheels;Cane - single point;Crutches;Shower seat   Additional Comments: equipment belonged to wife      Prior Functioning/Environment Level of Independence: Independent        Comments: ADLs, IADLs, and driving        OT Problem List: Decreased strength;Decreased activity tolerance;Impaired balance (sitting and/or standing);Decreased coordination;Decreased cognition;Decreased safety awareness;Decreased knowledge of use of DME or AE;Decreased knowledge of precautions;Pain;Impaired UE functional use      OT Treatment/Interventions: Self-care/ADL training;Therapeutic exercise;Energy conservation;DME and/or AE instruction;Therapeutic activities;Patient/family education    OT Goals(Current goals can be found in the care plan section) Acute Rehab OT Goals Patient Stated Goal: return home OT Goal Formulation: With patient Time For Goal Achievement: 01/22/17 Potential to Achieve Goals: Good ADL Goals Pt Will Perform Grooming: with set-up;with supervision;sitting Pt Will Perform Upper Body Dressing: with set-up;with supervision;sitting Pt Will Perform Lower Body Dressing: sit to/from stand;with min assist Pt Will Transfer to Toilet: with min assist;stand pivot transfer;bedside commode Pt Will Perform Toileting - Clothing Manipulation and hygiene: with min assist;sit to/from stand Additional ADL Goal #1: Pt will adhere to WB precautions with Min VCs  OT Frequency: Min 2X/week   Barriers to D/C:            Co-evaluation              AM-PAC PT "6 Clicks" Daily Activity     Outcome Measure Help from another person eating meals?: A Little Help from another person taking care of personal grooming?: A Little Help from another  person toileting, which includes using toliet, bedpan, or urinal?: A Lot Help from another person bathing (including washing, rinsing, drying)?: A Lot Help from another person to put on and  taking off regular upper body clothing?: A Lot Help from another person to put on and taking off regular lower body clothing?: A Lot 6 Click Score: 14   End of Session Equipment Utilized During Treatment: Gait belt Nurse Communication: Mobility status;Other (comment);Weight bearing status(Pt off O2 and SpO2 in 90s)  Activity Tolerance: Patient tolerated treatment well Patient left: in chair;with call bell/phone within reach;with chair alarm set;with family/visitor present  OT Visit Diagnosis: Unsteadiness on feet (R26.81);Other abnormalities of gait and mobility (R26.89);Muscle weakness (generalized) (M62.81);Other symptoms and signs involving cognitive function;Pain Pain - part of body: Arm;Hand;Leg(LUE; RLE)                Time: 1437-1500 OT Time Calculation (min): 23 min Charges:  OT General Charges $OT Visit: 1 Visit OT Evaluation $OT Eval Moderate Complexity: 1 Mod OT Treatments $Self Care/Home Management : 8-22 mins G-Codes:     [place], OTR/L Acute Rehab Pager: [phone] Office: [place] 01/08/2017, 5:05 PM

## 2017-01-08 NOTE — Progress Notes (Signed)
Patient ID: Tyler Norris, male   DOB: Sep 02, 1934, 82 y.o.   MRN: [id number]  BP 128/67   Pulse (!) 57   Temp 98.2 F (36.8 C) (Oral)   Resp 18   Ht 5\' 9"  (1.753 m)   Wt 97.5 kg (215 lb)   SpO2 92%   BMI 31.75 kg/m   Patiet seen. No neurologic deficits. Films reviewed last night. No neurosurgical problems at this time. No followup necessary.

## 2017-01-08 NOTE — Progress Notes (Signed)
Orthopedic Tech Progress Note Patient Details:  Tyler Norris 07-31-1934 [id number]  Ortho Devices Type of Ortho Device: Ace wrap, Short arm splint Splint Material: Fiberglass Ortho Device/Splint Location: Rt leg Ortho Device/Splint Interventions: Application   [place] Pink 01/08/2017, 11:42 AM

## 2017-01-08 NOTE — Progress Notes (Signed)
PT Cancellation Note  Patient Details Name: Tyler Norris MRN: [id number] DOB: December 18, 1934   Cancelled Treatment:    Reason Eval/Treat Not Completed: Other (comment)(order received, chart reviewed, await left hand weight bearing status)   Dorleen Kissel B Abdoul Encinas 01/08/2017, 9:55 AM Elwyn Reach, [place]

## 2017-01-08 NOTE — Evaluation (Signed)
Physical Therapy Evaluation Patient Details Name: Tyler Norris MRN: [id number] DOB: 01/16/1935 Today's Date: 01/08/2017   History of Present Illness  82 yo male getting down from roof fell when ladder slipped. Pt admitted with SDH, Rt ankle fx, left 5th phalanx fx. PMHx: Lupus, AAA, fibromyalgia, HTN, arthritis  Clinical Impression  Pt pleasant and willing to mobilize with decreased awareness of precautions and even after being told he is NWB on RLE states "I can put some weight through it". Pt educated multiple times during session for MD orders and weight bearing status. Pt demonstrates decreased strength, function, transfers, safety awareness and mobility who will benefit from acute therapy to maximize mobility, function and adherence to precautions.      Follow Up Recommendations SNF;Supervision/Assistance - 24 hour    Equipment Recommendations  Wheelchair (measurements PT);Wheelchair cushion (measurements PT);Other (comment)(left platform RW)    Recommendations for Other Services OT consult     Precautions / Restrictions Precautions Precautions: Fall Restrictions Weight Bearing Restrictions: Yes LUE Weight Bearing: Weight bear through elbow only RLE Weight Bearing: Non weight bearing      Mobility  Bed Mobility Overal bed mobility: Needs Assistance Bed Mobility: Supine to Sit     Supine to sit: Supervision     General bed mobility comments: cues for sequence and to not push with LUE, with HOB 20 degrees and increased time  Transfers Overall transfer level: Needs assistance   Transfers: Lateral/Scoot Transfers;Sit to/from [employer] Sit to Stand: Max assist;+2 physical assistance        Lateral/Scoot Transfers: Min assist;+2 safety/equipment General transfer comment: attempted to have pt perform squat pivot from bed to chair toward right with pt unable to maintain weight off of RLE even with P.T. foot under his foot and max cues with assist.  Transitioned to lateral scoot transfer with drop arm recliner and pt able to perform with min assist and cues for sequence. In chair pt able to stand with bil UE assist, RLE on PT food and increased time and assist to rise  Ambulation/Gait             General Gait Details: unable  Stairs            Wheelchair Mobility    Modified Rankin (Stroke Patients Only)       Balance Overall balance assessment: Needs assistance   Sitting balance-Leahy Scale: Fair       Standing balance-Leahy Scale: Poor                               Pertinent Vitals/Pain      Home Living Family/patient expects to be discharged to:: Private residence Living Arrangements: Alone   Type of Home: House Home Access: Stairs to enter Entrance Stairs-Rails: None Entrance Stairs-Number of Steps: 2 Home Layout: One level Home Equipment: Grab bars - tub/shower;Walker - 4 wheels;Cane - single point;Crutches;Shower seat Additional Comments: equipment belonged to wife    Prior Function Level of Independence: Independent               Hand Dominance        Extremity/Trunk Assessment   Upper Extremity Assessment Upper Extremity Assessment: LUE deficits/detail LUE Deficits / Details: unable to push with LUE due to fx. Overall within funtional limits but not fully assessed    Lower Extremity Assessment Lower Extremity Assessment: RLE deficits/detail;LLE deficits/detail RLE Deficits / Details: unable to fully assess due to splinting  and NWB status  LLE Deficits / Details: decreased strength and difficulty pushing through LLE for transfers grossly 3+/5    Cervical / Trunk Assessment Cervical / Trunk Assessment: Other exceptions Cervical / Trunk Exceptions: rounded shoulders, forward head  Communication   Communication: No difficulties  Cognition Arousal/Alertness: Awake/alert Behavior During Therapy: WFL for tasks assessed/performed Overall Cognitive Status:  Impaired/Different from baseline Area of Impairment: Safety/judgement                         Safety/Judgement: Decreased awareness of deficits;Decreased awareness of safety     General Comments: pt stating several times well I will just" try it out a little at a time" and needs education for continued safety with assist for all mobility      General Comments      Exercises     Assessment/Plan    PT Assessment Patient needs continued PT services  PT Problem List Decreased strength;Decreased mobility;Decreased safety awareness;Decreased activity tolerance;Decreased balance;Decreased knowledge of use of DME;Pain;Decreased cognition;Decreased knowledge of precautions       PT Treatment Interventions Gait training;Therapeutic exercise;DME instruction;Therapeutic activities;Cognitive remediation;Balance training;Functional mobility training;Patient/family education    PT Goals (Current goals can be found in the Care Plan section)  Acute Rehab PT Goals Patient Stated Goal: return home PT Goal Formulation: With patient Time For Goal Achievement: 01/22/17 Potential to Achieve Goals: Fair    Frequency Min 3X/week   Barriers to discharge Decreased caregiver support      Co-evaluation               AM-PAC PT "6 Clicks" Daily Activity  Outcome Measure Difficulty turning over in bed (including adjusting bedclothes, sheets and blankets)?: A Little Difficulty moving from lying on back to sitting on the side of the bed? : A Little Difficulty sitting down on and standing up from a chair with arms (e.g., wheelchair, bedside commode, etc,.)?: Unable Help needed moving to and from a bed to chair (including a wheelchair)?: A Lot Help needed walking in hospital room?: Total Help needed climbing 3-5 steps with a railing? : Total 6 Click Score: 11    End of Session Equipment Utilized During Treatment: Gait belt Activity Tolerance: Patient tolerated treatment well Patient  left: in chair;with call bell/phone within reach;with chair alarm set Nurse Communication: Mobility status;Other (comment)(sequence for lateral scoot with 2 person assist and pad back to bed) PT Visit Diagnosis: Other abnormalities of gait and mobility (R26.89);Muscle weakness (generalized) (M62.81)    Time: 9518-8416 PT Time Calculation (min) (ACUTE ONLY): 30 min   Charges:   PT Evaluation $PT Eval Moderate Complexity: 1 Mod PT Treatments $Therapeutic Activity: 8-22 mins   PT G Codes:        Elwyn Reach, PT [phone]   Krystyne Tewksbury B Jaivion Kingsley 01/08/2017, 1:25 PM

## 2017-01-08 NOTE — Progress Notes (Signed)
   Subjective:    Recheck right ankle and left hand s/p fall off a roof yesterday Pt sustained subdural hematoma during the fall Currently right ankle is splinted with minimal pain Pt c/o pain to left 5th finger and left thenar area X-rays show non displaced P1 fracture of 5th finger No other abnormalities seen in left hand films Right Shoulder stable currently  Patient reports pain as moderate.  Objective:   VITALS:   Vitals:   01/08/17 1030 01/08/17 1045  BP: (!) 125/56 126/61  Pulse: 65 66  Resp: 20 12  Temp:    SpO2: 95% 94%    Right lower extremity in splint  nv intact distally Left hand: minimal tenderness to left thenar area Mild edema and tenderness to left 5th fingre No rashes, edema or signs of open injury  LABS Recent Labs    01/07/17 2103 01/08/17 0203  HGB 13.4 12.9*  HCT 39.4 38.0*  WBC 12.7* 12.8*  PLT 180 202    Recent Labs    01/07/17 2103 01/08/17 0203  NA 143 140  K 3.7 3.9  BUN 19 18  CREATININE 0.93 1.01  GLUCOSE 147* 159*     Assessment/Plan:   1. Right ankle fracture: currently stable Will treat nonoperatively and plan for a cast once he is discharged 2. Left 5th P1 fracture: will splint today No need for surgical management for any of his orthopedic injuries Will continue to monitor his progress while in the hospital Pain management as needed     [place], PA-C  01/08/2017, 11:16 AM

## 2017-01-08 NOTE — H&P (Signed)
Activation and Reason: consult, fall  Primary Survey: airway intact, breath sounds bilateral, pulses intact  Tyler Norris is an 82 y.o. male.  HPI: 82 yo male was on roof for 41mn, was getting down on his ladder when it slipped from under him and he fell. He saw lights but denies LOC. He complains of pain in head, neck left hand, right foot, and left foot  Past Medical History:  Diagnosis Date  . AAA (abdominal aortic aneurysm) ([place]    a. resolved by ct scan 09-2011  . Anemia   . Arthritis   . Barrett's esophagus   . Carpal tunnel syndrome, bilateral   . Essential hypertension   . Fibromyalgia   . GERD (gastroesophageal reflux disease)   . History of pneumonia   . Hyperlipidemia   . Lupus (systemic lupus erythematosus) ([place]   . Nephrolithiasis   . Pericardial effusion    a. 08/22/2014 Echo: EF 60-65%, mildly dil Ao root (439m, mildly dil LA, mod-large partially organized pericardial effusion with possible early tamponade - asymptomatic on high dose nsaids-->conservative mgmt;  b. 08/2014 Limited Echo: EF 55-60%, Triv - small effusion.  . Sleep apnea    a. uses cpap every night    Past Surgical History:  Procedure Laterality Date  . APPENDECTOMY    . CYSTOSCOPY W/ STONE MANIPULATION  [id number]. HAND SURGERY     scar repaired lt hand  . HERNIA REPAIR  1966  . KNEE ARTHROPLASTY    . KNEE ARTHROSCOPY Left   . TONSILLECTOMY      Family History  Problem Relation Age of Onset  . Heart disease Father   . Hyperlipidemia Father   . Hypertension Father   . Other Father        VARICOSE VEIN  . Peripheral vascular disease Father   . Diabetes Sister     Social History:  reports that  has never smoked. he has never used smokeless tobacco. He reports that he does not drink alcohol or use drugs.  Allergies:  Allergies  Allergen Reactions  . Atorvastatin Other (See Comments)    Myalgias  . Lipitor [Atorvastatin Calcium]     Myalgias    Medications: I have  reviewed the patient's current medications.  Results for orders placed or performed during the hospital encounter of 01/07/17 (from the past 48 hour(s))  Basic metabolic panel     Status: Abnormal   Collection Time: 01/07/17  9:03 PM  Result Value Ref Range   Sodium 143 135 - 145 mmol/L   Potassium 3.7 3.5 - 5.1 mmol/L   Chloride 109 101 - 111 mmol/L   CO2 25 22 - 32 mmol/L   Glucose, Bld 147 (H) 65 - 99 mg/dL   BUN 19 6 - 20 mg/dL   Creatinine, Ser 0.93 0.61 - 1.24 mg/dL   Calcium 9.4 8.9 - 10.3 mg/dL   GFR calc non Af Amer >60 >60 mL/min   GFR calc Af Amer >60 >60 mL/min    Comment: (NOTE) The eGFR has been calculated using the CKD EPI equation. This calculation has not been validated in all clinical situations. eGFR's persistently <60 mL/min signify possible Chronic Kidney Disease.    Anion gap 9 5 - 15  CBC     Status: Abnormal   Collection Time: 01/07/17  9:03 PM  Result Value Ref Range   WBC 12.7 (H) 4.0 - 10.5 K/uL   RBC 4.45 4.22 - 5.81 MIL/uL   Hemoglobin 13.4  13.0 - 17.0 g/dL   HCT 39.4 39.0 - 52.0 %   MCV 88.5 78.0 - 100.0 fL   MCH 30.1 26.0 - 34.0 pg   MCHC 34.0 30.0 - 36.0 g/dL   RDW 12.7 11.5 - 15.5 %   Platelets 180 150 - 400 K/uL  Protime-INR     Status: None   Collection Time: 01/07/17  9:03 PM  Result Value Ref Range   Prothrombin Time 14.1 11.4 - 15.2 seconds   INR 1.10   Type and screen [place]     Status: None   Collection Time: 01/07/17  9:03 PM  Result Value Ref Range   ABO/RH(D) A POS    Antibody Screen NEG    Sample Expiration 01/10/2017     Dg Chest 2 View  Result Date: 01/07/2017 CLINICAL DATA:  Fall from roof. EXAM: CHEST  2 VIEW COMPARISON:  11/09/2015 FINDINGS: Lungs are adequately inflated without focal consolidation, effusion or pneumothorax. Cardiomediastinal silhouette is within normal. There is mild degenerate change of the spine and shoulders. No acute fracture. IMPRESSION: No acute findings. Electronically  Signed   By: Marin Olp M.D.   On: 01/07/2017 21:46   Dg Ankle Complete Left  Result Date: 01/07/2017 CLINICAL DATA:  82 y/o M; fall from roof with left medial ankle pain. EXAM: LEFT ANKLE COMPLETE - 3+ VIEW COMPARISON:  None. FINDINGS: No acute fracture or dislocation identified. Small lucencies within the medial and lateral corners of talar dome may represent fibrocystic degenerative changes or osteochondral injury. Ankle mortise is symmetric on these nonstress views. Small well corticated ossific density adjacent to medial malleolus compatible with sequelae of old trauma. Plantar calcaneal enthesophyte. Vascular calcifications. Pes plans. IMPRESSION: 1. No acute fracture or dislocation identified. 2. Small lucencies within medial and lateral corners of talar dome may represent fibrocystic degenerative changes or osteochondral injury. 3. Well corticated calcific density adjacent to medial malleolus compatible with old trauma. 4. Plantar calcaneal bone spur. 5. Pes planus. Electronically Signed   By: Kristine Garbe M.D.   On: 01/07/2017 21:43   Dg Ankle Complete Right  Result Date: 01/07/2017 CLINICAL DATA:  Fall off roof with bilateral ankle pain and swelling. EXAM: RIGHT ANKLE - COMPLETE 3+ VIEW COMPARISON:  None. FINDINGS: There is a minimally displaced oblique fracture of the distal fibular diametaphyseal region just above the ankle mortise. Ankle mortise is within normal. Moderate size inferior calcaneal spur is present. There is mild degenerative change over the midfoot/hindfoot and tibiotalar joint. Small vessel atherosclerotic disease is present. IMPRESSION: Minimally displaced oblique fracture of the distal fibula just above the ankle mortise. Electronically Signed   By: Marin Olp M.D.   On: 01/07/2017 21:45   Ct Head Wo Contrast  Result Date: 01/07/2017 CLINICAL DATA:  Initial evaluation for acute head trauma. EXAM: CT HEAD WITHOUT CONTRAST CT MAXILLOFACIAL WITHOUT CONTRAST  CT CERVICAL SPINE WITHOUT CONTRAST TECHNIQUE: Multidetector CT imaging of the head, cervical spine, and maxillofacial structures were performed using the standard protocol without intravenous contrast. Multiplanar CT image reconstructions of the cervical spine and maxillofacial structures were also generated. COMPARISON:  Prior MRI from 08/30/2011. FINDINGS: CT HEAD FINDINGS Brain: Age appropriate cerebral atrophy. Mild chronic micro ischemic disease. Small acute right parafalcine subdural hematoma seen along the anterior and posterior falx, MEASURING UP TO 7 MM in maximal thickness. No significant mass effect. No other acute intracranial hemorrhage. No acute large vessel territory infarct. Small remote right cerebellar infarct noted. No mass lesion, midline  shift or mass effect. No hydrocephalus. Vascular: No hyperdense vessel. Scattered vascular calcifications noted within the carotid siphons. Skull: Large soft tissue contusion at the right forehead extending into the nasal bridge/right periorbital region. Calvarium intact. Other: Trace bilateral mastoid effusions. CT MAXILLOFACIAL FINDINGS Osseous: Zygomatic arches intact. No acute maxillary fracture. Pterygoid plates intact. Nasal bones intact. Nasal septum bowed to the right but intact. No acute mandibular fracture. Mandibular condyles normally situated. No acute abnormality about the dentition. Orbits: Globes intact. No retro-orbital hematoma or other pathology. Bony orbits intact without orbital floor fracture. Sinuses: Moderate mucosal thickening within the ethmoidal air cells and left maxillary sinus. Small bowel layering fluid within the left maxillary sinus. No hemosinus. Soft tissues: Large contusion at the right forehead and periorbital region, extending towards the nasal bridge. CT CERVICAL SPINE FINDINGS Alignment: Straightening of the normal cervical lordosis. Trace anterolisthesis of C4 on C5, likely chronic and degenerative. Skull base and  vertebrae: Skullbase intact. Normal C1-2 articulations are preserved in the dens is intact. Vertebral body heights maintained. No acute fracture. Soft tissues and spinal canal: Soft tissues of the neck demonstrate no acute abnormality. No abnormal prevertebral edema. Right carotid artery medialized into the retropharyngeal space. Prominent atherosclerotic calcifications present about the carotid bifurcations. Approximate 2 cm hypodense right thyroid nodule noted. Disc levels: Moderate to advanced degenerate spondylolysis present at C5-6 and C6-7. Predominant left-sided facet arthropathy. Upper chest: Visualized upper chest demonstrates no acute abnormality. Visualized lung apices are clear. No apical pneumothorax. Calcified granuloma noted at the left upper lobe. Other: None. IMPRESSION: CT HEAD: 1. Small volume acute parafalcine subdural hemorrhage. No associated mass effect. 2. Large right frontal scalp contusion, extending into the right periorbital region and towards the nasal bridge. Calvarium intact. 3. Small remote right cerebellar infarct. 4. Age appropriate cerebral atrophy with mild chronic small vessel ischemic disease. CT MAXILLOFACIAL: 1. Large frontal scalp/periorbital contusion as above. 2. No other acute maxillofacial injury.  No fracture. 3. Moderate ethmoidal and maxillary sinusitis. CT CERVICAL SPINE: 1. No acute traumatic injury within cervical spine. 2. Moderate to advanced degenerative spondylolysis at C5-6 and C6-7 with prominent left-sided facet arthropathy. Critical Value/emergent results were called by telephone at the time of interpretation on 01/07/2017 at approximately 10:07 pm to Dr. Theotis Burrow , who verbally acknowledged these results. Electronically Signed   By: Jeannine Boga M.D.   On: 01/07/2017 23:07   Ct Cervical Spine Wo Contrast  Result Date: 01/07/2017 CLINICAL DATA:  Initial evaluation for acute head trauma. EXAM: CT HEAD WITHOUT CONTRAST CT MAXILLOFACIAL  WITHOUT CONTRAST CT CERVICAL SPINE WITHOUT CONTRAST TECHNIQUE: Multidetector CT imaging of the head, cervical spine, and maxillofacial structures were performed using the standard protocol without intravenous contrast. Multiplanar CT image reconstructions of the cervical spine and maxillofacial structures were also generated. COMPARISON:  Prior MRI from 08/30/2011. FINDINGS: CT HEAD FINDINGS Brain: Age appropriate cerebral atrophy. Mild chronic micro ischemic disease. Small acute right parafalcine subdural hematoma seen along the anterior and posterior falx, MEASURING UP TO 7 MM in maximal thickness. No significant mass effect. No other acute intracranial hemorrhage. No acute large vessel territory infarct. Small remote right cerebellar infarct noted. No mass lesion, midline shift or mass effect. No hydrocephalus. Vascular: No hyperdense vessel. Scattered vascular calcifications noted within the carotid siphons. Skull: Large soft tissue contusion at the right forehead extending into the nasal bridge/right periorbital region. Calvarium intact. Other: Trace bilateral mastoid effusions. CT MAXILLOFACIAL FINDINGS Osseous: Zygomatic arches intact. No acute maxillary fracture. Pterygoid plates intact.  Nasal bones intact. Nasal septum bowed to the right but intact. No acute mandibular fracture. Mandibular condyles normally situated. No acute abnormality about the dentition. Orbits: Globes intact. No retro-orbital hematoma or other pathology. Bony orbits intact without orbital floor fracture. Sinuses: Moderate mucosal thickening within the ethmoidal air cells and left maxillary sinus. Small bowel layering fluid within the left maxillary sinus. No hemosinus. Soft tissues: Large contusion at the right forehead and periorbital region, extending towards the nasal bridge. CT CERVICAL SPINE FINDINGS Alignment: Straightening of the normal cervical lordosis. Trace anterolisthesis of C4 on C5, likely chronic and degenerative. Skull  base and vertebrae: Skullbase intact. Normal C1-2 articulations are preserved in the dens is intact. Vertebral body heights maintained. No acute fracture. Soft tissues and spinal canal: Soft tissues of the neck demonstrate no acute abnormality. No abnormal prevertebral edema. Right carotid artery medialized into the retropharyngeal space. Prominent atherosclerotic calcifications present about the carotid bifurcations. Approximate 2 cm hypodense right thyroid nodule noted. Disc levels: Moderate to advanced degenerate spondylolysis present at C5-6 and C6-7. Predominant left-sided facet arthropathy. Upper chest: Visualized upper chest demonstrates no acute abnormality. Visualized lung apices are clear. No apical pneumothorax. Calcified granuloma noted at the left upper lobe. Other: None. IMPRESSION: CT HEAD: 1. Small volume acute parafalcine subdural hemorrhage. No associated mass effect. 2. Large right frontal scalp contusion, extending into the right periorbital region and towards the nasal bridge. Calvarium intact. 3. Small remote right cerebellar infarct. 4. Age appropriate cerebral atrophy with mild chronic small vessel ischemic disease. CT MAXILLOFACIAL: 1. Large frontal scalp/periorbital contusion as above. 2. No other acute maxillofacial injury.  No fracture. 3. Moderate ethmoidal and maxillary sinusitis. CT CERVICAL SPINE: 1. No acute traumatic injury within cervical spine. 2. Moderate to advanced degenerative spondylolysis at C5-6 and C6-7 with prominent left-sided facet arthropathy. Critical Value/emergent results were called by telephone at the time of interpretation on 01/07/2017 at approximately 10:07 pm to Dr. Theotis Burrow , who verbally acknowledged these results. Electronically Signed   By: Jeannine Boga M.D.   On: 01/07/2017 23:07   Ct Maxillofacial Wo Cm  Result Date: 01/07/2017 CLINICAL DATA:  Initial evaluation for acute head trauma. EXAM: CT HEAD WITHOUT CONTRAST CT MAXILLOFACIAL  WITHOUT CONTRAST CT CERVICAL SPINE WITHOUT CONTRAST TECHNIQUE: Multidetector CT imaging of the head, cervical spine, and maxillofacial structures were performed using the standard protocol without intravenous contrast. Multiplanar CT image reconstructions of the cervical spine and maxillofacial structures were also generated. COMPARISON:  Prior MRI from 08/30/2011. FINDINGS: CT HEAD FINDINGS Brain: Age appropriate cerebral atrophy. Mild chronic micro ischemic disease. Small acute right parafalcine subdural hematoma seen along the anterior and posterior falx, MEASURING UP TO 7 MM in maximal thickness. No significant mass effect. No other acute intracranial hemorrhage. No acute large vessel territory infarct. Small remote right cerebellar infarct noted. No mass lesion, midline shift or mass effect. No hydrocephalus. Vascular: No hyperdense vessel. Scattered vascular calcifications noted within the carotid siphons. Skull: Large soft tissue contusion at the right forehead extending into the nasal bridge/right periorbital region. Calvarium intact. Other: Trace bilateral mastoid effusions. CT MAXILLOFACIAL FINDINGS Osseous: Zygomatic arches intact. No acute maxillary fracture. Pterygoid plates intact. Nasal bones intact. Nasal septum bowed to the right but intact. No acute mandibular fracture. Mandibular condyles normally situated. No acute abnormality about the dentition. Orbits: Globes intact. No retro-orbital hematoma or other pathology. Bony orbits intact without orbital floor fracture. Sinuses: Moderate mucosal thickening within the ethmoidal air cells and left maxillary sinus. Small bowel  layering fluid within the left maxillary sinus. No hemosinus. Soft tissues: Large contusion at the right forehead and periorbital region, extending towards the nasal bridge. CT CERVICAL SPINE FINDINGS Alignment: Straightening of the normal cervical lordosis. Trace anterolisthesis of C4 on C5, likely chronic and degenerative. Skull  base and vertebrae: Skullbase intact. Normal C1-2 articulations are preserved in the dens is intact. Vertebral body heights maintained. No acute fracture. Soft tissues and spinal canal: Soft tissues of the neck demonstrate no acute abnormality. No abnormal prevertebral edema. Right carotid artery medialized into the retropharyngeal space. Prominent atherosclerotic calcifications present about the carotid bifurcations. Approximate 2 cm hypodense right thyroid nodule noted. Disc levels: Moderate to advanced degenerate spondylolysis present at C5-6 and C6-7. Predominant left-sided facet arthropathy. Upper chest: Visualized upper chest demonstrates no acute abnormality. Visualized lung apices are clear. No apical pneumothorax. Calcified granuloma noted at the left upper lobe. Other: None. IMPRESSION: CT HEAD: 1. Small volume acute parafalcine subdural hemorrhage. No associated mass effect. 2. Large right frontal scalp contusion, extending into the right periorbital region and towards the nasal bridge. Calvarium intact. 3. Small remote right cerebellar infarct. 4. Age appropriate cerebral atrophy with mild chronic small vessel ischemic disease. CT MAXILLOFACIAL: 1. Large frontal scalp/periorbital contusion as above. 2. No other acute maxillofacial injury.  No fracture. 3. Moderate ethmoidal and maxillary sinusitis. CT CERVICAL SPINE: 1. No acute traumatic injury within cervical spine. 2. Moderate to advanced degenerative spondylolysis at C5-6 and C6-7 with prominent left-sided facet arthropathy. Critical Value/emergent results were called by telephone at the time of interpretation on 01/07/2017 at approximately 10:07 pm to Dr. Theotis Burrow , who verbally acknowledged these results. Electronically Signed   By: Jeannine Boga M.D.   On: 01/07/2017 23:07    Review of Systems  Constitutional: Negative for chills and fever.  HENT: Negative for hearing loss.   Eyes: Positive for pain. Negative for blurred vision  and double vision.  Respiratory: Negative for cough and hemoptysis.   Cardiovascular: Negative for chest pain and palpitations.  Gastrointestinal: Negative for abdominal pain, nausea and vomiting.  Genitourinary: Negative for dysuria and urgency.  Musculoskeletal: Positive for falls and joint pain. Negative for myalgias and neck pain.  Skin: Negative for itching and rash.  Neurological: Positive for headaches. Negative for dizziness and tingling.  Endo/Heme/Allergies: Does not bruise/bleed easily.  Psychiatric/Behavioral: Negative for depression and suicidal ideas.   Blood pressure (!) 146/68, pulse 81, temperature 98.8 F (37.1 C), temperature source Oral, resp. rate (!) 21, height _0  (1.753 m), weight 97.5 kg (215 lb), SpO2 93 %. Physical Exam  Constitutional: He is oriented to person, place, and time. He appears well-developed and well-nourished.  HENT:  Head: Not microcephalic. Head is with abrasion and with contusion. Head is without raccoon's eyes.  Right Ear: No drainage or swelling. No foreign bodies.  Left Ear: No drainage or swelling. No foreign bodies.  Nose: No mucosal edema, rhinorrhea or nose lacerations.  Mouth/Throat: Oropharynx is clear and moist and mucous membranes are normal.  Eyes: EOM are normal. Pupils are equal, round, and reactive to light. Right eye exhibits no discharge. Left eye exhibits no discharge.  Neck: Neck supple.  Cardiovascular:  Pulses:      Carotid pulses are 2+ on the right side, and 2+ on the left side.      Radial pulses are 2+ on the right side, and 2+ on the left side.       Dorsalis pedis pulses are 2+ on the right  side, and 2+ on the left side.  Respiratory: No apnea. He has no decreased breath sounds. He has no wheezes. He has no rhonchi. He has no rales.  GI: He exhibits no shifting dullness and no distension. There is no tenderness. There is no rigidity, no guarding, no tenderness at McBurney's point and negative Murphy's sign.    Neurological: He is alert and oriented to person, place, and time. He has normal strength. No cranial nerve deficit or sensory deficit. GCS eye subscore is 4. GCS verbal subscore is 5. GCS motor subscore is 6.  Skin:  Abrasion right forehead, ecchymosis of lids with right eye nearly swollen shut  Psychiatric: His speech is normal and behavior is normal. Thought content normal. His mood appears anxious.      Assessment/Plan: 63 male on roof, fell, SDH seen on CT, fibular fx right side, GCS 15, only blood thinner is baby aspirin -admit to icu -consult NSG -consult ortho -right leg in splint -ice to forehead -continue nicardipine drip  Procedures: none  Arta Bruce Kinsinger 01/08/2017, 12:45 AM

## 2017-01-08 NOTE — Progress Notes (Signed)
Trauma MD notified of patients SBP >160 since transfer to [facility]. New orders and BP goal needed. RN will continue to monitor patient.

## 2017-01-08 NOTE — Consult Note (Signed)
ORTHO CONSULT  Subjective: 82 yo male who presents to the [facility] hospital with multiple injuries after a fall off a ladder while cleaning the roof. Patient sustained closed head trauma and bilateral ankle injuries.  He has been admitted to the trauma service for a subdural hematoma and ortho was asked to see the patient for a right ankle fracture   Objective: Vital signs in last 24 hours: Temp:  [98.7 F (37.1 C)-98.8 F (37.1 C)] 98.8 F (37.1 C) (11/11 2302) Pulse Rate:  [73-86] 80 (11/12 0300) Resp:  [18-24] 21 (11/12 0300) BP: (122-239)/(53-143) 142/64 (11/12 0300) SpO2:  [93 %-97 %] 94 % (11/12 0300) FiO2 (%):  [0 %] 0 % (11/12 0044) Weight:  [97.5 kg (215 lb)] 97.5 kg (215 lb) (11/11 2017)  Intake/Output from previous day: 11/11 0701 - 11/12 0700 In: 398.8 [I.V.:398.8] Out: -  Intake/Output this shift: Total I/O In: 398.8 [I.V.:398.8] Out: -   Recent Labs    01/07/17 2103  HGB 13.4   Recent Labs    01/07/17 2103  WBC 12.7*  RBC 4.45  HCT 39.4  PLT 180   Recent Labs    01/07/17 2103 01/08/17 0203  NA 143 140  K 3.7 3.9  CL 109 107  CO2 25 24  BUN 19 18  CREATININE 0.93 1.01  GLUCOSE 147* 159*  CALCIUM 9.4 8.9   Recent Labs    01/07/17 2103  INR 1.10    PHYSICAL EXAM:  General: elderly male who is in no acute distress with ice on his forehead. Alert and Oriented. Right shoulder with some generalized tenderness around the posterior shoulder blade area, but no deformity and no crepitance. Right anterior shoulder and AC joint area non tender with no deformity. Good push pull with bilateral UEs Chest: normal excursion Right arm no swelling and no tenderness, left arm non tender proximally, but small finger is swollen and tender and the radial side of the wrist is swollen and tender. ROM is preserved and there is no gross deformity Right LE: short leg splint in place, toes pink and warm, normal distal sensation, knee and hip ROM pain free Left LE:  generalized edema in the leg and ankle, no focal tenderness.  XRAYS:  Right ankle: Weber B nondisplaced fibula fracture, mortise intact Left ankle: generalized arthritis but no fracture CXR: bilateral shoulder OA, no scapula fracture on the right  Assessment/Plan: Non-displaced closed and neurovascularly intact right Weber B ankle fracture. Recommend NWB R LE in the short leg splint.  Will treat this closed with casting for 6 weeks. Activity as tolerated on the left LE Check XRAYs on the left hand - ordered. Observe right posterior shoulder pain. Will follow   Kemba Hoppes,STEVEN R 01/08/2017, 3:23 AM

## 2017-01-08 NOTE — Progress Notes (Signed)
Trauma Service Note  Subjective: Patient awakens easily.  No acute distress.  Ice pack over eyes and on forehead.  Has cephalohematoma in that area.  Objective: Vital signs in last 24 hours: Temp:  [98.3 F (36.8 C)-98.9 F (37.2 C)] 98.9 F (37.2 C) (11/12 0400) Pulse Rate:  [68-86] 69 (11/12 0700) Resp:  [12-24] 20 (11/12 0700) BP: (112-239)/(49-143) 112/56 (11/12 0700) SpO2:  [90 %-97 %] 92 % (11/12 0700) FiO2 (%):  [0 %] 0 % (11/12 0044) Weight:  [97.5 kg (215 lb)] 97.5 kg (215 lb) (11/11 2017) Last BM Date: (pta)  Intake/Output from previous day: 11/11 0701 - 11/12 0700 In: 992.4 [I.V.:992.4] Out: 145 [Urine:145] Intake/Output this shift: No intake/output data recorded.  General: No acute distress  Lungs: Clear  Abd: Soft, good bowel sounds.  Extremities: Appreciate the assistance of the orthopedic surgeon.  No surgery is planned  Neuro: Intact, GCS 15  Lab Results: CBC  Recent Labs    01/07/17 2103 01/08/17 0203  WBC 12.7* 12.8*  HGB 13.4 12.9*  HCT 39.4 38.0*  PLT 180 202   BMET Recent Labs    01/07/17 2103 01/08/17 0203  NA 143 140  K 3.7 3.9  CL 109 107  CO2 25 24  GLUCOSE 147* 159*  BUN 19 18  CREATININE 0.93 1.01  CALCIUM 9.4 8.9   PT/INR Recent Labs    01/07/17 2103  LABPROT 14.1  INR 1.10   ABG No results for input(s): PHART, HCO3 in the last 72 hours.  Invalid input(s): PCO2, PO2  Studies/Results: Dg Chest 2 View  Result Date: 01/07/2017 CLINICAL DATA:  Fall from roof. EXAM: CHEST  2 VIEW COMPARISON:  11/09/2015 FINDINGS: Lungs are adequately inflated without focal consolidation, effusion or pneumothorax. Cardiomediastinal silhouette is within normal. There is mild degenerate change of the spine and shoulders. No acute fracture. IMPRESSION: No acute findings. Electronically Signed   By: Marin Olp M.D.   On: 01/07/2017 21:46   Dg Ankle Complete Left  Result Date: 01/07/2017 CLINICAL DATA:  82 y/o M; fall from roof with  left medial ankle pain. EXAM: LEFT ANKLE COMPLETE - 3+ VIEW COMPARISON:  None. FINDINGS: No acute fracture or dislocation identified. Small lucencies within the medial and lateral corners of talar dome may represent fibrocystic degenerative changes or osteochondral injury. Ankle mortise is symmetric on these nonstress views. Small well corticated ossific density adjacent to medial malleolus compatible with sequelae of old trauma. Plantar calcaneal enthesophyte. Vascular calcifications. Pes plans. IMPRESSION: 1. No acute fracture or dislocation identified. 2. Small lucencies within medial and lateral corners of talar dome may represent fibrocystic degenerative changes or osteochondral injury. 3. Well corticated calcific density adjacent to medial malleolus compatible with old trauma. 4. Plantar calcaneal bone spur. 5. Pes planus. Electronically Signed   By: Kristine Garbe M.D.   On: 01/07/2017 21:43   Dg Ankle Complete Right  Result Date: 01/07/2017 CLINICAL DATA:  Fall off roof with bilateral ankle pain and swelling. EXAM: RIGHT ANKLE - COMPLETE 3+ VIEW COMPARISON:  None. FINDINGS: There is a minimally displaced oblique fracture of the distal fibular diametaphyseal region just above the ankle mortise. Ankle mortise is within normal. Moderate size inferior calcaneal spur is present. There is mild degenerative change over the midfoot/hindfoot and tibiotalar joint. Small vessel atherosclerotic disease is present. IMPRESSION: Minimally displaced oblique fracture of the distal fibula just above the ankle mortise. Electronically Signed   By: Marin Olp M.D.   On: 01/07/2017 21:45  Ct Head Wo Contrast  Result Date: 01/07/2017 CLINICAL DATA:  Initial evaluation for acute head trauma. EXAM: CT HEAD WITHOUT CONTRAST CT MAXILLOFACIAL WITHOUT CONTRAST CT CERVICAL SPINE WITHOUT CONTRAST TECHNIQUE: Multidetector CT imaging of the head, cervical spine, and maxillofacial structures were performed using the  standard protocol without intravenous contrast. Multiplanar CT image reconstructions of the cervical spine and maxillofacial structures were also generated. COMPARISON:  Prior MRI from 08/30/2011. FINDINGS: CT HEAD FINDINGS Brain: Age appropriate cerebral atrophy. Mild chronic micro ischemic disease. Small acute right parafalcine subdural hematoma seen along the anterior and posterior falx, MEASURING UP TO 7 MM in maximal thickness. No significant mass effect. No other acute intracranial hemorrhage. No acute large vessel territory infarct. Small remote right cerebellar infarct noted. No mass lesion, midline shift or mass effect. No hydrocephalus. Vascular: No hyperdense vessel. Scattered vascular calcifications noted within the carotid siphons. Skull: Large soft tissue contusion at the right forehead extending into the nasal bridge/right periorbital region. Calvarium intact. Other: Trace bilateral mastoid effusions. CT MAXILLOFACIAL FINDINGS Osseous: Zygomatic arches intact. No acute maxillary fracture. Pterygoid plates intact. Nasal bones intact. Nasal septum bowed to the right but intact. No acute mandibular fracture. Mandibular condyles normally situated. No acute abnormality about the dentition. Orbits: Globes intact. No retro-orbital hematoma or other pathology. Bony orbits intact without orbital floor fracture. Sinuses: Moderate mucosal thickening within the ethmoidal air cells and left maxillary sinus. Small bowel layering fluid within the left maxillary sinus. No hemosinus. Soft tissues: Large contusion at the right forehead and periorbital region, extending towards the nasal bridge. CT CERVICAL SPINE FINDINGS Alignment: Straightening of the normal cervical lordosis. Trace anterolisthesis of C4 on C5, likely chronic and degenerative. Skull base and vertebrae: Skullbase intact. Normal C1-2 articulations are preserved in the dens is intact. Vertebral body heights maintained. No acute fracture. Soft tissues and  spinal canal: Soft tissues of the neck demonstrate no acute abnormality. No abnormal prevertebral edema. Right carotid artery medialized into the retropharyngeal space. Prominent atherosclerotic calcifications present about the carotid bifurcations. Approximate 2 cm hypodense right thyroid nodule noted. Disc levels: Moderate to advanced degenerate spondylolysis present at C5-6 and C6-7. Predominant left-sided facet arthropathy. Upper chest: Visualized upper chest demonstrates no acute abnormality. Visualized lung apices are clear. No apical pneumothorax. Calcified granuloma noted at the left upper lobe. Other: None. IMPRESSION: CT HEAD: 1. Small volume acute parafalcine subdural hemorrhage. No associated mass effect. 2. Large right frontal scalp contusion, extending into the right periorbital region and towards the nasal bridge. Calvarium intact. 3. Small remote right cerebellar infarct. 4. Age appropriate cerebral atrophy with mild chronic small vessel ischemic disease. CT MAXILLOFACIAL: 1. Large frontal scalp/periorbital contusion as above. 2. No other acute maxillofacial injury.  No fracture. 3. Moderate ethmoidal and maxillary sinusitis. CT CERVICAL SPINE: 1. No acute traumatic injury within cervical spine. 2. Moderate to advanced degenerative spondylolysis at C5-6 and C6-7 with prominent left-sided facet arthropathy. Critical Value/emergent results were called by telephone at the time of interpretation on 01/07/2017 at approximately 10:07 pm to Dr. Theotis Burrow , who verbally acknowledged these results. Electronically Signed   By: Jeannine Boga M.D.   On: 01/07/2017 23:07   Ct Cervical Spine Wo Contrast  Result Date: 01/07/2017 CLINICAL DATA:  Initial evaluation for acute head trauma. EXAM: CT HEAD WITHOUT CONTRAST CT MAXILLOFACIAL WITHOUT CONTRAST CT CERVICAL SPINE WITHOUT CONTRAST TECHNIQUE: Multidetector CT imaging of the head, cervical spine, and maxillofacial structures were performed using  the standard protocol without intravenous contrast. Multiplanar CT  image reconstructions of the cervical spine and maxillofacial structures were also generated. COMPARISON:  Prior MRI from 08/30/2011. FINDINGS: CT HEAD FINDINGS Brain: Age appropriate cerebral atrophy. Mild chronic micro ischemic disease. Small acute right parafalcine subdural hematoma seen along the anterior and posterior falx, MEASURING UP TO 7 MM in maximal thickness. No significant mass effect. No other acute intracranial hemorrhage. No acute large vessel territory infarct. Small remote right cerebellar infarct noted. No mass lesion, midline shift or mass effect. No hydrocephalus. Vascular: No hyperdense vessel. Scattered vascular calcifications noted within the carotid siphons. Skull: Large soft tissue contusion at the right forehead extending into the nasal bridge/right periorbital region. Calvarium intact. Other: Trace bilateral mastoid effusions. CT MAXILLOFACIAL FINDINGS Osseous: Zygomatic arches intact. No acute maxillary fracture. Pterygoid plates intact. Nasal bones intact. Nasal septum bowed to the right but intact. No acute mandibular fracture. Mandibular condyles normally situated. No acute abnormality about the dentition. Orbits: Globes intact. No retro-orbital hematoma or other pathology. Bony orbits intact without orbital floor fracture. Sinuses: Moderate mucosal thickening within the ethmoidal air cells and left maxillary sinus. Small bowel layering fluid within the left maxillary sinus. No hemosinus. Soft tissues: Large contusion at the right forehead and periorbital region, extending towards the nasal bridge. CT CERVICAL SPINE FINDINGS Alignment: Straightening of the normal cervical lordosis. Trace anterolisthesis of C4 on C5, likely chronic and degenerative. Skull base and vertebrae: Skullbase intact. Normal C1-2 articulations are preserved in the dens is intact. Vertebral body heights maintained. No acute fracture. Soft tissues  and spinal canal: Soft tissues of the neck demonstrate no acute abnormality. No abnormal prevertebral edema. Right carotid artery medialized into the retropharyngeal space. Prominent atherosclerotic calcifications present about the carotid bifurcations. Approximate 2 cm hypodense right thyroid nodule noted. Disc levels: Moderate to advanced degenerate spondylolysis present at C5-6 and C6-7. Predominant left-sided facet arthropathy. Upper chest: Visualized upper chest demonstrates no acute abnormality. Visualized lung apices are clear. No apical pneumothorax. Calcified granuloma noted at the left upper lobe. Other: None. IMPRESSION: CT HEAD: 1. Small volume acute parafalcine subdural hemorrhage. No associated mass effect. 2. Large right frontal scalp contusion, extending into the right periorbital region and towards the nasal bridge. Calvarium intact. 3. Small remote right cerebellar infarct. 4. Age appropriate cerebral atrophy with mild chronic small vessel ischemic disease. CT MAXILLOFACIAL: 1. Large frontal scalp/periorbital contusion as above. 2. No other acute maxillofacial injury.  No fracture. 3. Moderate ethmoidal and maxillary sinusitis. CT CERVICAL SPINE: 1. No acute traumatic injury within cervical spine. 2. Moderate to advanced degenerative spondylolysis at C5-6 and C6-7 with prominent left-sided facet arthropathy. Critical Value/emergent results were called by telephone at the time of interpretation on 01/07/2017 at approximately 10:07 pm to Dr. Theotis Burrow , who verbally acknowledged these results. Electronically Signed   By: Jeannine Boga M.D.   On: 01/07/2017 23:07   Ct Maxillofacial Wo Cm  Result Date: 01/07/2017 CLINICAL DATA:  Initial evaluation for acute head trauma. EXAM: CT HEAD WITHOUT CONTRAST CT MAXILLOFACIAL WITHOUT CONTRAST CT CERVICAL SPINE WITHOUT CONTRAST TECHNIQUE: Multidetector CT imaging of the head, cervical spine, and maxillofacial structures were performed using the  standard protocol without intravenous contrast. Multiplanar CT image reconstructions of the cervical spine and maxillofacial structures were also generated. COMPARISON:  Prior MRI from 08/30/2011. FINDINGS: CT HEAD FINDINGS Brain: Age appropriate cerebral atrophy. Mild chronic micro ischemic disease. Small acute right parafalcine subdural hematoma seen along the anterior and posterior falx, MEASURING UP TO 7 MM in maximal thickness. No significant mass  effect. No other acute intracranial hemorrhage. No acute large vessel territory infarct. Small remote right cerebellar infarct noted. No mass lesion, midline shift or mass effect. No hydrocephalus. Vascular: No hyperdense vessel. Scattered vascular calcifications noted within the carotid siphons. Skull: Large soft tissue contusion at the right forehead extending into the nasal bridge/right periorbital region. Calvarium intact. Other: Trace bilateral mastoid effusions. CT MAXILLOFACIAL FINDINGS Osseous: Zygomatic arches intact. No acute maxillary fracture. Pterygoid plates intact. Nasal bones intact. Nasal septum bowed to the right but intact. No acute mandibular fracture. Mandibular condyles normally situated. No acute abnormality about the dentition. Orbits: Globes intact. No retro-orbital hematoma or other pathology. Bony orbits intact without orbital floor fracture. Sinuses: Moderate mucosal thickening within the ethmoidal air cells and left maxillary sinus. Small bowel layering fluid within the left maxillary sinus. No hemosinus. Soft tissues: Large contusion at the right forehead and periorbital region, extending towards the nasal bridge. CT CERVICAL SPINE FINDINGS Alignment: Straightening of the normal cervical lordosis. Trace anterolisthesis of C4 on C5, likely chronic and degenerative. Skull base and vertebrae: Skullbase intact. Normal C1-2 articulations are preserved in the dens is intact. Vertebral body heights maintained. No acute fracture. Soft tissues and  spinal canal: Soft tissues of the neck demonstrate no acute abnormality. No abnormal prevertebral edema. Right carotid artery medialized into the retropharyngeal space. Prominent atherosclerotic calcifications present about the carotid bifurcations. Approximate 2 cm hypodense right thyroid nodule noted. Disc levels: Moderate to advanced degenerate spondylolysis present at C5-6 and C6-7. Predominant left-sided facet arthropathy. Upper chest: Visualized upper chest demonstrates no acute abnormality. Visualized lung apices are clear. No apical pneumothorax. Calcified granuloma noted at the left upper lobe. Other: None. IMPRESSION: CT HEAD: 1. Small volume acute parafalcine subdural hemorrhage. No associated mass effect. 2. Large right frontal scalp contusion, extending into the right periorbital region and towards the nasal bridge. Calvarium intact. 3. Small remote right cerebellar infarct. 4. Age appropriate cerebral atrophy with mild chronic small vessel ischemic disease. CT MAXILLOFACIAL: 1. Large frontal scalp/periorbital contusion as above. 2. No other acute maxillofacial injury.  No fracture. 3. Moderate ethmoidal and maxillary sinusitis. CT CERVICAL SPINE: 1. No acute traumatic injury within cervical spine. 2. Moderate to advanced degenerative spondylolysis at C5-6 and C6-7 with prominent left-sided facet arthropathy. Critical Value/emergent results were called by telephone at the time of interpretation on 01/07/2017 at approximately 10:07 pm to Dr. Theotis Burrow , who verbally acknowledged these results. Electronically Signed   By: Jeannine Boga M.D.   On: 01/07/2017 23:07    Anti-infectives: Anti-infectives (From admission, onward)   Start     Dose/Rate Route Frequency Ordered Stop   01/08/17 1000  hydroxychloroquine (PLAQUENIL) tablet 200 mg     200 mg Oral 2 times daily 01/08/17 1700        Assessment/Plan: s/p  Advance diet Hypertension is being controlled with nicardipine drip at this  time and will beed to add something for BP control.    Once the patient is off the drip will be able to move out of the ICU   LOS: 0 days   Kathryne Eriksson. Dahlia Bailiff, MD, FACS [phone] Trauma Surgeon 01/08/2017

## 2017-01-09 MED ORDER — ENOXAPARIN SODIUM 40 MG/0.4ML ~~LOC~~ SOLN
40.0000 mg | SUBCUTANEOUS | Status: DC
  Administered 2017-01-09 – 2017-01-11 (×3): 40 mg via SUBCUTANEOUS
  Filled 2017-01-09 (×3): qty 0.4

## 2017-01-09 MED ORDER — AMLODIPINE BESYLATE 5 MG PO TABS
5.0000 mg | ORAL_TABLET | Freq: Every day | ORAL | Status: DC
  Administered 2017-01-09 – 2017-01-11 (×3): 5 mg via ORAL
  Filled 2017-01-09 (×3): qty 1

## 2017-01-09 NOTE — Clinical Social Work Note (Signed)
Clinical Social Work Assessment  Patient Details  Name: Tyler Norris MRN: [id number] Date of Birth: 10-22-1934  Date of referral:  01/09/17               Reason for consult:  Facility Placement, Trauma                Permission sought to share information with:  Family Supports Permission granted to share information::  Yes, Verbal Permission Granted  Name::     Tyler Norris  Relationship::  Daughter  Contact Information:  [phone]  Housing/Transportation Living arrangements for the past 2 months:  [place] of Information:  Patient, Adult Children Patient Interpreter Needed:  None Criminal Activity/Legal Involvement Pertinent to Current Situation/Hospitalization:  No - Comment as needed Significant Relationships:  Adult Children, Other(Comment)(Ex wift) Lives with:  Self Do you feel safe going back to the place where you live?  Yes Need for family participation in patient care:  Yes (Comment)  Care giving concerns:  Patient daughter is aware that patient would prefer to return home at discharge, however due to lack of support 24/7 patient will likely need SNF prior to return home.  Patient daughter plans to further address with patient.   Social Worker assessment / plan:  Holiday representative met with patient at bedside to offer support and discuss patient needs at discharge.  Patient states that he lives at home alone and was up on the roof cleaning out the gutters and the leaves.  Patient lost his balance and went face first off the roof.  Patient states that his wife is currently dying in a facility in [facility] and he feels that he has just too much going on right now for SNF placement.  Patient daughter came into the room at the end of conversation and states that patient has a lack of family support available 24/7 and therefore may need SNF placement to bridge his return home.  Patient and patient daughter plan to communicate about discharge, however did  provide permission to initiate search and see who offers a bed.  CSW remains available and will follow up with patient and family once offers have been made.  Clinical Social Worker inquired about current substance use.  Patient states that there are no concerns with any type of use for drugs and/or alcohol at this time.  SBIRT complete.  No resources needed.  Employment status:  Retired Forensic scientist:  Medicare PT Recommendations:  [place] / Referral to community resources:  SBIRT, [place]  Patient/Family's Response to care:  Patient and family verbalized understanding of CSW role and appreciation for support and concern.  Patient and family to further discuss SNF placement and let CSW know the plan.  Patient/Family's Understanding of and Emotional Response to Diagnosis, Current Treatment, and Prognosis:  Patient is understanding of his injuries but not completely understanding of his limitations at discharge.  Patient is emotional about the decline with his wife and just doesn't want to feel "trapped" if something were to happen to her.  Emotional Assessment Appearance:  Appears younger than stated age Attitude/Demeanor/Rapport:  (Engaged . Willing, Appropriate) Affect (typically observed):  Accepting, Appropriate, Calm, Pleasant Orientation:  Oriented to Self, Oriented to Situation, Oriented to Place, Oriented to  Time Alcohol / Substance use:  Alcohol Use(Minimal) Psych involvement (Current and /or in the community):  No (Comment)  Discharge Needs  Concerns to be addressed:  No discharge needs identified Readmission within  the last 30 days:  No Current discharge risk:  Lack of support system Barriers to Discharge:  No Barriers Identified, Continued Medical Work up  [employer], [place]

## 2017-01-09 NOTE — Plan of Care (Signed)
  Progressing Education: Knowledge of General Education information will improve 01/09/2017 2341 - Progressing by Joya San, RN Health Behavior/Discharge Planning: Ability to manage health-related needs will improve 01/09/2017 2341 - Progressing by Joya San, RN Clinical Measurements: Ability to maintain clinical measurements within normal limits will improve 01/09/2017 2341 - Progressing by Joya San, RN Will remain free from infection 01/09/2017 2341 - Progressing by Joya San, RN Diagnostic test results will improve 01/09/2017 2341 - Progressing by Joya San, RN Respiratory complications will improve 01/09/2017 2341 - Progressing by Joya San, RN Cardiovascular complication will be avoided 01/09/2017 2341 - Progressing by Joya San, RN Activity: Risk for activity intolerance will decrease 01/09/2017 2341 - Progressing by Joya San, RN Nutrition: Adequate nutrition will be maintained 01/09/2017 2341 - Progressing by Joya San, RN Coping: Level of anxiety will decrease 01/09/2017 2341 - Progressing by Joya San, RN Elimination: Will not experience complications related to bowel motility 01/09/2017 2341 - Progressing by Joya San, RN Will not experience complications related to urinary retention 01/09/2017 2341 - Progressing by Joya San, RN Pain Managment: General experience of comfort will improve 01/09/2017 2341 - Progressing by Joya San, RN Safety: Ability to remain free from injury will improve 01/09/2017 2341 - Progressing by Joya San, RN Skin Integrity: Risk for impaired skin integrity will decrease 01/09/2017 2341 - Progressing by Joya San, RN

## 2017-01-09 NOTE — [place] FL2 (Signed)
[place] MEDICAID FL2 LEVEL OF CARE SCREENING TOOL     IDENTIFICATION  Patient Name: Tyler Norris Birthdate: 07-18-1934 Sex: male Admission Date (Current Location): 01/07/2017  [facility] and [place] Number:  Herbalist and Address:  [facility] [place], [place] [address], [place], [place] [address]      Provider Number: [id number]  Attending Physician Name and Address:  Md, Trauma, MD  Relative Name and Phone Number:       Current Level of Care: Hospital Recommended Level of Care: [place] Prior Approval Number:    Date Approved/Denied:   PASRR Number: [id number]  Discharge Plan: SNF    Current Diagnoses: Patient Active Problem List   Diagnosis Date Noted  . SDH (subdural hematoma) ([place]) 01/08/2017  . Pleural effusion on left 06/02/2015  . Aneurysm of thoracic aorta ([place]) 06/02/2015  . SOB (shortness of breath) 08/22/2014  . Pericardial effusion 08/22/2014  . Lupus (systemic lupus erythematosus) ([place])   . 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 PE (pulmonary embolism) 06/05/2012  . Pleural effusion exudative 05/26/2012  . Essential hypertension 05/13/2012  . OSA on CPAP 05/13/2012  . Cardiomegaly 05/13/2012  . Abdominal aneurysm without mention of rupture 06/27/2011    Orientation RESPIRATION BLADDER Height & Weight     Self, Time, Situation, Place  Normal Continent Weight: 215 lb (97.5 kg) Height:  5\' 9"  (175.3 cm)  BEHAVIORAL SYMPTOMS/MOOD NEUROLOGICAL BOWEL NUTRITION STATUS      Continent Diet(Regular Diet with Thin Liquids)  AMBULATORY STATUS COMMUNICATION OF NEEDS Skin   Extensive Assist Verbally Other (Comment)(Head Laceration)                       Personal Care Assistance Level of Assistance  Bathing, Feeding, Dressing Bathing Assistance: Limited assistance Feeding assistance: Independent Dressing Assistance: Limited assistance     Functional Limitations Info  Sight, Hearing, Speech  Sight Info: Impaired(temporary eye swelling from accident) Hearing Info: Adequate Speech Info: Adequate    SPECIAL CARE FACTORS FREQUENCY  PT (By licensed PT), OT (By licensed OT)     PT Frequency: 5x week OT Frequency: 5x week            Contractures Contractures Info: Not present    Additional Factors Info  Code Status, Allergies Code Status Info: Full Code Allergies Info: Atorvastatin, Lipitor Atorvastatin Calcium           Current Medications (01/09/2017):  This is the current hospital active medication list Current Facility-Administered Medications  Medication Dose Route Frequency Provider Last Rate Last Dose  . acetaminophen (TYLENOL) tablet 1,000 mg  1,000 mg Oral Q8H Judeth Horn, MD   1,000 mg at 01/09/17 0700  . amLODipine (NORVASC) tablet 5 mg  5 mg Oral Daily Georganna Skeans, MD   5 mg at 01/09/17 1016  . calcium-vitamin D (OSCAL WITH D) 500-200 MG-UNIT per tablet 1 tablet  1 tablet Oral Daily Kinsinger, Arta Bruce, MD   1 tablet at 01/09/17 [phone]  . dextrose 5 %-0.45 % sodium chloride infusion   Intravenous Continuous Judeth Horn, MD   Stopped at 01/08/17 2000  . docusate sodium (COLACE) capsule 100 mg  100 mg Oral BID Kinsinger, Arta Bruce, MD   100 mg at 01/09/17 0917  . enoxaparin (LOVENOX) injection 40 mg  40 mg Subcutaneous Q24H Georganna Skeans, MD      . hydrALAZINE (APRESOLINE) injection 5 mg  5 mg Intravenous Q4H PRN Clovis Riley, MD  5 mg at 01/09/17 1027  . hydroxychloroquine (PLAQUENIL) tablet 200 mg  200 mg Oral BID Judeth Horn, MD   200 mg at 01/09/17 2536  . labetalol (NORMODYNE,TRANDATE) injection 10 mg  10 mg Intravenous Q2H PRN Romana Juniper A, MD      . metoprolol tartrate (LOPRESSOR) injection 5 mg  5 mg Intravenous Q6H PRN Kinsinger, Arta Bruce, MD   5 mg at 01/09/17 0347  . metoprolol tartrate (LOPRESSOR) tablet 12.5 mg  12.5 mg Oral BID Judeth Horn, MD   12.5 mg at 01/09/17 6440  . morphine 4 MG/ML injection 2-4 mg  2-4 mg  Intravenous Q1H PRN Kinsinger, Arta Bruce, MD      . multivitamin with minerals tablet 1 tablet  1 tablet Oral Daily Judeth Horn, MD   1 tablet at 01/09/17 [phone]  . ondansetron (ZOFRAN-ODT) disintegrating tablet 4 mg  4 mg Oral Q6H PRN Kinsinger, Arta Bruce, MD       Or  . ondansetron [facility]) injection 4 mg  4 mg Intravenous Q6H PRN Kinsinger, Arta Bruce, MD      . oxyCODONE (Oxy IR/ROXICODONE) immediate release tablet 5 mg  5 mg Oral Q4H PRN Kinsinger, Arta Bruce, MD      . pantoprazole (PROTONIX) EC tablet 40 mg  40 mg Oral Daily Kinsinger, Arta Bruce, MD   40 mg at 01/09/17 0917  . rosuvastatin (CRESTOR) tablet 2.5 mg  2.5 mg Oral Daily Judeth Horn, MD   2.5 mg at 01/09/17 0917  . traMADol (ULTRAM) tablet 50 mg  50 mg Oral Q12H Judeth Horn, MD   50 mg at 01/09/17 2595     Discharge Medications: Please see discharge summary for a list of discharge medications.  Relevant Imaging Results:  Relevant Lab Results:   Additional Information SSN [id number]  Barbette Or, [place]

## 2017-01-09 NOTE — Progress Notes (Signed)
Patient refused CPAP at this time. RN is aware.  RT informed patient and RN to call RT if assistance is needed. RT will monitor as needed.

## 2017-01-09 NOTE — Plan of Care (Signed)
Pt is NWB to RLE, needs reinforcement regarding this

## 2017-01-09 NOTE — Progress Notes (Signed)
  Subjective: Eating, C/O some R lateral neck soreness, HA  Objective: Vital signs in last 24 hours: Temp:  [97.7 F (36.5 C)-98.3 F (36.8 C)] 98 F (36.7 C) (11/13 0400) Pulse Rate:  [50-74] 65 (11/13 0600) Resp:  [12-26] 16 (11/13 0600) BP: (110-181)/(53-105) 172/89 (11/13 0700) SpO2:  [89 %-100 %] 99 % (11/13 0600) Last BM Date: (PTA)  Intake/Output from previous day: 11/12 0701 - 11/13 0700 In: 386.7 [I.V.:386.7] Out: 725 [Urine:725] Intake/Output this shift: No intake/output data recorded.  General appearance: alert and cooperative Head: large foreheag hematoma and B periorbital ecchymoses Resp: clear to auscultation bilaterally Cardio: regular rate and rhythm, S1, S2 normal, no murmur, click, rub or gallop GI: soft, non-tender; bowel sounds normal; no masses,  no organomegaly  Neuro: oriented, speech fluent, F/C  Lab Results: CBC  Recent Labs    01/07/17 2103 01/08/17 0203  WBC 12.7* 12.8*  HGB 13.4 12.9*  HCT 39.4 38.0*  PLT 180 202   BMET Recent Labs    01/07/17 2103 01/08/17 0203  NA 143 140  K 3.7 3.9  CL 109 107  CO2 25 24  GLUCOSE 147* 159*  BUN 19 18  CREATININE 0.93 1.01  CALCIUM 9.4 8.9   PT/INR Recent Labs    01/07/17 2103  LABPROT 14.1  INR 1.10   Assessment/Plan: Fall L SDH - per Dr. Christella Noa, no F/U needed R ankle FX - NWB, short leg splint per Dr. Veverly Fells L 5th prox phalanx FX - splint per Dr. Veverly Fells Large forehead hematoma HTN - Lopressor 125mg  BID - HR too low to increase this, add Norvasc daily. WIll need to F/U with his primary MD FEN - reg diet VTE - PAS, D/W Dr. Christella Noa OK for Lovenox now Dispo - PT/OTCSW consult for SNF placement    LOS: 1 day    Georganna Skeans, MD, MPH, FACS Trauma: [phone] General Surgery: [phone]  11/13/2018Patient ID: Tyler Norris, male   DOB: 20-May-1934, 82 y.o.   MRN: [id number]

## 2017-01-09 NOTE — Care Management Note (Addendum)
Case Management Note  Patient Details  Name: Tyler Norris MRN: [id number] Date of Birth: 1934/03/04  Subjective/Objective:  Pt admitted on 01/07/17 after falling from his roof, sustaining SDH, Rt ankle fx, and Lt 5th phalanx fx.  PTA, pt independent, lives alone.  Pt's wife is in a nursing facility after suffering a stroke.                    Action/Plan: PT/OT recommending SNF; CSW consulted to facilitate possible dc to SNF upon medical stability.  Will follow progress.    Expected Discharge Date:                  Expected Discharge Plan:  Skilled Nursing Facility  In-House Referral:  Clinical Social Work  Discharge planning Services  CM Consult  Post Acute Care Choice:    Choice offered to:     DME Arranged:    DME Agency:     HH Arranged:    [place] Agency:     Status of Service:  In process, will continue to follow  If discussed at Long Length of Stay Meetings, dates discussed:    Additional Comments:  Reinaldo Raddle, RN, BSN  Trauma/Neuro ICU Case Manager [phone]

## 2017-01-09 NOTE — Progress Notes (Signed)
1900: Pt with urge to have a bowel movement, but hesitant to use the bed pan. Phys.Therapy note makes me hesitant to use the bedside commode, but I let the pt know that we can try it as a last resort if he feels that he cannot void via bedpan. Voiding in urinal.   2100: Pt resting comfortably. Denies pain. When asked if there is anything bothering him at all, he reports neck stiffness. Given scheduled tramadol.  2300: Pt resting comfortably.  0100: Pt continues to rest comfortably.  0200: Pt with O2 sats dropping to the 80s while he sleeps. Wears CPAP at home. Provider paged for order. Pt prefers to wear 2L O2 via nasal cannula. Sats holding while sleeping. Will continue to monitor.  0400: Pt resting comfortably.  0700: Handoff report given to RN.

## 2017-01-09 NOTE — Progress Notes (Signed)
Orthopedics Progress Note  Subjective: Patient reports increased neck soreness, but feels better in the ankle and the left hand.  Objective:  Vitals:   01/09/17 1100 01/09/17 1140  BP: (!) 137/101   Pulse: 73   Resp: 20   Temp:  98 F (36.7 C)  SpO2: 97%     General: Awake and alert  Musculoskeletal: Left hand in well molded ulnar gutter splint. Sensation intact in the fingers Right ankle remains immobilized in the short leg splint. Toes pink and sensate. Neurovascularly intact  Lab Results  Component Value Date   WBC 12.8 (H) 01/08/2017   HGB 12.9 (L) 01/08/2017   HCT 38.0 (L) 01/08/2017   MCV 88.4 01/08/2017   PLT 202 01/08/2017       Component Value Date/Time   NA 140 01/08/2017 0203   K 3.9 01/08/2017 0203   CL 107 01/08/2017 0203   CO2 24 01/08/2017 0203   GLUCOSE 159 (H) 01/08/2017 0203   BUN 18 01/08/2017 0203   CREATININE 1.01 01/08/2017 0203   CREATININE 0.80 12/11/2016 0859   CALCIUM 8.9 01/08/2017 0203   GFRNONAA >60 01/08/2017 0203   GFRNONAA 84 12/11/2016 0859   GFRAA >60 01/08/2017 0203   GFRAA 98 12/11/2016 0859    Lab Results  Component Value Date   INR 1.10 01/07/2017   INR 1.30 08/23/2014   INR 1.33 08/22/2014    Assessment/Plan: Right ankle fracture - stable. Continue NWB R LE in the short leg splint. Continue elevation as he can to decrease swelling and pain. Will need outpatient follow up in the office in two weeks after discharge for transition to cast or boot.  Left small finger non-displaced proximal phalanx fracture - splinted.  Continue elevation and recommend minimal use of the left hand accordingly Agree with SNF due to problems with left and right extremities involved in injury. DVT prophylaxis.  Doran Heater. Veverly Fells, MD 01/09/2017 12:02 PM

## 2017-01-10 MED ORDER — MORPHINE SULFATE (PF) 4 MG/ML IV SOLN: 1.0000 mg | INTRAVENOUS | Status: DC | PRN

## 2017-01-10 MED ORDER — OXYCODONE HCL 5 MG PO TABS: 5.0000 mg | ORAL_TABLET | Freq: Four times a day (QID) | ORAL | Status: DC | PRN

## 2017-01-10 NOTE — Progress Notes (Signed)
CSW provided pt with bed offers- pt would like [facility] SNF- CSW confirmed with [facility] they will have bed for patient tomorrow  CSW will continue to follow  Jorge Ny, [place] Social Worker [phone]

## 2017-01-10 NOTE — Progress Notes (Signed)
Physical Therapy Treatment Patient Details Name: Tyler Norris MRN: [id number] DOB: [birth year] Today's Date: 01/10/2017    History of Present Illness 82 yo male getting down from roof fell when ladder slipped. Pt admitted with SDH, Rt ankle fx, left 5th phalanx fx. PMHx: Lupus, AAA, fibromyalgia, HTN, arthritis    PT Comments    Patient seen for activity progression and attempted mobility with use of Bilateral PFRW. Patient tolerated well but does required increased physical assist for all aspects of OOB activity. Able to tolerate steps with AD and assist to chair. Current POC remains appropriate.   Follow Up Recommendations  SNF;Supervision/Assistance - 24 hour     Equipment Recommendations  Wheelchair (measurements PT);Wheelchair cushion (measurements PT);Other (comment)(left platform RW)    Recommendations for Other Services OT consult     Precautions / Restrictions Precautions Precautions: Fall Restrictions Weight Bearing Restrictions: Yes LUE Weight Bearing: Weight bear through elbow only RLE Weight Bearing: Non weight bearing    Mobility  Bed Mobility Overal bed mobility: Needs Assistance Bed Mobility: Supine to Sit     Supine to sit: Supervision     General bed mobility comments: Pt performing bed mobility with supervision for safety and able to maintain NWB of LUE  Transfers Overall transfer level: Needs assistance Equipment used: Right platform walker;Left platform walker Transfers: Sit to/from Stand Sit to Stand: Min assist;+2 physical assistance;From elevated surface         General transfer comment: Pt performing sit<>stand at EOB with elevated surface. Pt placeing LUE on plateform and pushing up with RUE. Pt demonstrating increase balance to maintain standing (while adhering to NWB status on RLE) to position RUE on plateform. Min A to power up and gain balance in standing  Ambulation/Gait Ambulation/Gait assistance: Mod assist;+2 physical  assistance;+2 safety/equipment Ambulation Distance (Feet): 6 Feet Assistive device: Bilateral platform walker Gait Pattern/deviations: Step-to pattern Gait velocity: decreased   General Gait Details: increased physical assist, VCs for sequencing and technique. Cues for safety.   Stairs            Wheelchair Mobility    Modified Rankin (Stroke Patients Only)       Balance Overall balance assessment: Needs assistance Sitting-balance support: No upper extremity supported;Feet supported Sitting balance-Leahy Scale: Fair     Standing balance support: Bilateral upper extremity supported;During functional activity Standing balance-Leahy Scale: Poor                              Cognition Arousal/Alertness: Awake/alert Behavior During Therapy: WFL for tasks assessed/performed Overall Cognitive Status: Impaired/Different from baseline Area of Impairment: Safety/judgement;Problem solving;Awareness;Attention                   Current Attention Level: Selective     Safety/Judgement: Decreased awareness of safety Awareness: Emergent Problem Solving: Slow processing;Requires verbal cues General Comments: Pt presenting with increased attention and sequencing of tasks. Pt joking with PT and OT throughout session. Pt recalling WB status and limitations for LUE and RLE. Pt continues to require increased time and VCs.      Exercises Other Exercises Other Exercises: Provided education on edema management and positioning of LUE Other Exercises: Educated pt on ROM for elbow and shoulder to decreased edema and risk of frozen shoulder    General Comments General comments (skin integrity, edema, etc.): Ex-wife present during session. BP before activity 162/83; after activity 143/116. Pt reporting lightheadedness after mobility. Pt SPO2 93-92 on roomair;  place back on O2 and pt returned to 95%      Pertinent Vitals/Pain Pain Assessment: Faces Faces Pain Scale:  Hurts little more Pain Location: Face, L hand, and R leg Pain Descriptors / Indicators: Constant;Discomfort;Grimacing Pain Intervention(s): Monitored during session;Repositioned    Home Living                      Prior Function            PT Goals (current goals can now be found in the care plan section) Acute Rehab PT Goals Patient Stated Goal: return home PT Goal Formulation: With patient Time For Goal Achievement: 01/22/17 Potential to Achieve Goals: Fair Progress towards PT goals: Progressing toward goals    Frequency    Min 3X/week      PT Plan Current plan remains appropriate    Co-evaluation PT/OT/SLP Co-Evaluation/Treatment: Yes Reason for Co-Treatment: Complexity of the patient's impairments (multi-system involvement);For patient/therapist safety;Necessary to address cognition/behavior during functional activity PT goals addressed during session: Mobility/safety with mobility;Balance;Proper use of DME        AM-PAC PT "6 Clicks" Daily Activity  Outcome Measure  Difficulty turning over in bed (including adjusting bedclothes, sheets and blankets)?: A Little Difficulty moving from lying on back to sitting on the side of the bed? : A Little Difficulty sitting down on and standing up from a chair with arms (e.g., wheelchair, bedside commode, etc,.)?: Unable Help needed moving to and from a bed to chair (including a wheelchair)?: A Lot Help needed walking in hospital room?: Total Help needed climbing 3-5 steps with a railing? : Total 6 Click Score: 11    End of Session Equipment Utilized During Treatment: Gait belt Activity Tolerance: Patient tolerated treatment well Patient left: in chair;with call bell/phone within reach;with chair alarm set Nurse Communication: Mobility status;Other (comment)(sequence for lateral scoot with 2 person assist and pad back to bed) PT Visit Diagnosis: Other abnormalities of gait and mobility (R26.89);Muscle weakness  (generalized) (M62.81)     Time: 8850-2774 PT Time Calculation (min) (ACUTE ONLY): 25 min  Charges:  $Therapeutic Activity: 8-22 mins                    G Codes:       Alben Deeds, PT DPT  Board Certified Neurologic Specialist [facility] 01/10/2017, 12:16 PM

## 2017-01-10 NOTE — Discharge Instructions (Signed)

## 2017-01-10 NOTE — Progress Notes (Signed)
Patient ID: Tyler Norris, male   DOB: 11-13-1934, 82 y.o.   MRN: [id number]  [facility] Surgery Progress Note     Subjective: CC- sore Just finished PT. He did get lightheaded once and SBP dropped by about 20. Feeling better now. States that his ankle and pinkie are sore, but pain well controlled. Only took tramadol twice yesterday. Tolerating diet. Had a BM this morning. More amenable to SNF today than yesterday.  Objective: Vital signs in last 24 hours: Temp:  [97.8 F (36.6 C)-98.4 F (36.9 C)] 97.8 F (36.6 C) (11/14 0800) Pulse Rate:  [63-73] 63 (11/14 0400) Resp:  [16-23] 16 (11/14 0400) BP: (137-170)/(71-101) 160/89 (11/14 0956) SpO2:  [96 %-99 %] 97 % (11/14 0400) Last BM Date: 01/09/17  Intake/Output from previous day: 11/13 0701 - 11/14 0700 In: 480 [P.O.:480] Out: 830 [Urine:830] Intake/Output this shift: No intake/output data recorded.  PE: Gen:  Alert, NAD, pleasant HEENT: EOM's intact, pupils equal and round, large forehead hematoma with skin tact Card:  RRR, no M/G/R heard Pulm:  CTAB, no W/R/R, effort normal Abd: Soft, NT/ND, +BS, no HSM, no hernia Ext:  Splint to RLE, toes WWP, able to wiggle toes. Ulnar gutter splint to LUE Psych: A&Ox3  Skin: no rashes noted, warm and dry  Lab Results:  Recent Labs    01/07/17 2103 01/08/17 0203  WBC 12.7* 12.8*  HGB 13.4 12.9*  HCT 39.4 38.0*  PLT 180 202   BMET Recent Labs    01/07/17 2103 01/08/17 0203  NA 143 140  K 3.7 3.9  CL 109 107  CO2 25 24  GLUCOSE 147* 159*  BUN 19 18  CREATININE 0.93 1.01  CALCIUM 9.4 8.9   PT/INR Recent Labs    01/07/17 2103  LABPROT 14.1  INR 1.10   CMP     Component Value Date/Time   NA 140 01/08/2017 0203   K 3.9 01/08/2017 0203   CL 107 01/08/2017 0203   CO2 24 01/08/2017 0203   GLUCOSE 159 (H) 01/08/2017 0203   BUN 18 01/08/2017 0203   CREATININE 1.01 01/08/2017 0203   CREATININE 0.80 12/11/2016 0859   CALCIUM 8.9 01/08/2017 0203   PROT  6.4 12/11/2016 0859   ALBUMIN 3.3 (L) 08/22/2014 1600   AST 19 12/11/2016 0859   ALT 15 12/11/2016 0859   ALKPHOS 43 08/22/2014 1600   BILITOT 0.8 12/11/2016 0859   GFRNONAA >60 01/08/2017 0203   GFRNONAA 84 12/11/2016 0859   GFRAA >60 01/08/2017 0203   GFRAA 98 12/11/2016 0859   Lipase     Component Value Date/Time   LIPASE 28 06/05/2012 1700       Studies/Results: No results found.  Anti-infectives: Anti-infectives (From admission, onward)   Start     Dose/Rate Route Frequency Ordered Stop   01/08/17 1000  hydroxychloroquine (PLAQUENIL) tablet 200 mg     200 mg Oral 2 times daily 01/08/17 5621         Assessment/Plan Fall L SDH - per Dr. Christella Noa, no F/U needed R ankle FX - NWB, short leg splint per Dr. Veverly Fells. F/u 2 weeks L 5th prox phalanx FX - splint, recommends minimal use L hand per Dr. Veverly Fells Large forehead hematoma - ice/heat PRN HTN - Lopressor 12.5mg  BID, daily Norvasc 5mg . BP still elevated 160/89. Will need to F/U with his primary MD; was not on any med for HTN prior to admission Lupus - plaquenil FEN - KVO IVF, regular diet, colace VTE - PAS,  Lovenox Dispo - Continue therapies. Medically stable for discharge to SNF once bed available.   LOS: 2 days    Wellington Hampshire , [facility] Surgery 01/10/2017, 10:20 AM Pager: [phone] Consults: [phone] Mon-Fri 7:00 am-4:30 pm Sat-Sun 7:00 am-11:30 am

## 2017-01-10 NOTE — Progress Notes (Signed)
Occupational Therapy Treatment Patient Details Name: Tyler Norris MRN: [id number] DOB: 1934-07-20 Today's Date: 01/10/2017    History of present illness 82 yo male getting down from roof fell when ladder slipped. Pt admitted with SDH, Rt ankle fx, left 5th phalanx fx. PMHx: Lupus, AAA, fibromyalgia, HTN, arthritis   OT comments  Pt progressing towards established OT goals. Pt performing grooming at EOB with set up and UB dressing with Min A. Pt performing simulated toilet transfer with Bil platform RW and Min A +2. Provided education on edema management and RUE positioning. Continue to recommend post-acute rehab. Will continue to follow acutely to facilitate safe dc.    Follow Up Recommendations  SNF    Equipment Recommendations  Other (comment)(Defer to next venue)    Recommendations for Other Services PT consult    Precautions / Restrictions Precautions Precautions: Fall Restrictions Weight Bearing Restrictions: Yes LUE Weight Bearing: Weight bear through elbow only RLE Weight Bearing: Non weight bearing       Mobility Bed Mobility Overal bed mobility: Needs Assistance Bed Mobility: Supine to Sit     Supine to sit: Supervision     General bed mobility comments: Pt performing bed mobility with supervision for safety and able to maintain NWB of LUE  Transfers Overall transfer level: Needs assistance Equipment used: Right platform walker;Left platform walker Transfers: Sit to/from Stand Sit to Stand: Min assist;+2 physical assistance;From elevated surface         General transfer comment: Pt performing sit<>stand at EOB with elevated surface. Pt placeing LUE on plateform and pushing up with RUE. Pt demonstrating increase balance to maintain standing (while adhering to NWB status on RLE) to position RUE on plateform. Min A to power up and gain balance in standing    Balance Overall balance assessment: Needs assistance Sitting-balance support: No upper  extremity supported;Feet supported Sitting balance-Leahy Scale: Fair     Standing balance support: Bilateral upper extremity supported;During functional activity Standing balance-Leahy Scale: Poor                             ADL either performed or assessed with clinical judgement   ADL Overall ADL's : Needs assistance/impaired     Grooming: Set up;Supervision/safety;Sitting;Wash/dry face Grooming Details (indicate cue type and reason): Pt performing grooming task at EOB with set up and supervision to wash face.  Upper Body Bathing: Minimal assistance;Sitting Upper Body Bathing Details (indicate cue type and reason): Min A to don gown like jacket             Toilet Transfer: Minimal assistance;+2 for safety/equipment;Ambulation;RW(Bil platform walker; simulated to recliner) Toilet Transfer Details (indicate cue type and reason): Simulated toilet transfer to recliner and introducing plateform walker to increase mobility. Pt requiring Min A +2 for safety and Mod VCs for sequencing of task. Pt maintaining NWB with occasional use of TDWB.          Functional mobility during ADLs: Minimal assistance;+2 for safety/equipment(Bil Plateform walker) General ADL Comments: Providing education on edema management and positioning for LUE; pt verbalizing understanding. Pt demonstrating increased balance and following of commands.     Vision   Vision Assessment?: Vision impaired- to be further tested in functional context   Perception     Praxis      Cognition Arousal/Alertness: Awake/alert Behavior During Therapy: WFL for tasks assessed/performed Overall Cognitive Status: Impaired/Different from baseline Area of Impairment: Safety/judgement;Problem solving;Awareness;Attention  Current Attention Level: Selective     Safety/Judgement: Decreased awareness of safety Awareness: Emergent Problem Solving: Slow processing;Requires verbal cues General  Comments: Pt presenting with increased attention and sequencing of tasks. Pt joking with PT and OT throughout session. Pt recalling WB status and limitations for LUE and RLE. Pt continues to require increased time and VCs.        Exercises Exercises: Other exercises Other Exercises Other Exercises: Provided education on edema management and positioning of LUE Other Exercises: Educated pt on ROM for elbow and shoulder to decreased edema and risk of frozen shoulder   Shoulder Instructions       General Comments Ex-wife present during session. BP before activity 162/83; after activity 143/116. Pt reporting lightheadedness after mobility. Pt SPO2 93-92 on roomair; place back on O2 and pt returned to 95%    Pertinent Vitals/ Pain       Pain Assessment: Faces Faces Pain Scale: Hurts little more Pain Location: Face, L hand, and R leg Pain Descriptors / Indicators: Constant;Discomfort;Grimacing Pain Intervention(s): Monitored during session;Repositioned  Home Living                                          Prior Functioning/Environment              Frequency  Min 2X/week        Progress Toward Goals  OT Goals(current goals can now be found in the care plan section)  Progress towards OT goals: Progressing toward goals  Acute Rehab OT Goals Patient Stated Goal: return home OT Goal Formulation: With patient Time For Goal Achievement: 01/22/17 Potential to Achieve Goals: Good ADL Goals Pt Will Perform Grooming: with set-up;with supervision;sitting Pt Will Perform Upper Body Dressing: with set-up;with supervision;sitting Pt Will Perform Lower Body Dressing: sit to/from stand;with min assist Pt Will Transfer to Toilet: with min assist;stand pivot transfer;bedside commode Pt Will Perform Toileting - Clothing Manipulation and hygiene: with min assist;sit to/from stand Additional ADL Goal #1: Pt will adhere to WB precautions with Min VCs  Plan Discharge plan  remains appropriate    Co-evaluation                 AM-PAC PT "6 Clicks" Daily Activity     Outcome Measure   Help from another person eating meals?: A Little Help from another person taking care of personal grooming?: A Little Help from another person toileting, which includes using toliet, bedpan, or urinal?: A Lot Help from another person bathing (including washing, rinsing, drying)?: A Lot Help from another person to put on and taking off regular upper body clothing?: A Little Help from another person to put on and taking off regular lower body clothing?: A Lot 6 Click Score: 15    End of Session Equipment Utilized During Treatment: Gait belt;Rolling walker;Other (comment)(Bil platform RW)  OT Visit Diagnosis: Unsteadiness on feet (R26.81);Other abnormalities of gait and mobility (R26.89);Muscle weakness (generalized) (M62.81);Other symptoms and signs involving cognitive function;Pain Pain - part of body: Arm;Hand;Leg(LUE; RLE)   Activity Tolerance Patient tolerated treatment well   Patient Left in chair;with call bell/phone within reach;with chair alarm set;with family/visitor present   Nurse Communication Mobility status;Other (comment);Weight bearing status(Pt off O2 and SpO2 in 90s)        Time: 1941-7408 OT Time Calculation (min): 24 min  Charges: OT General Charges $OT Visit: 1 Visit OT Treatments $Self  Care/Home Management : 8-22 mins  [place], OTR/L Acute Rehab Pager: [phone] Office: [place] 01/10/2017, 11:03 AM

## 2017-01-11 DIAGNOSIS — K5900 Constipation, unspecified: Secondary | ICD-10-CM | POA: Diagnosis not present

## 2017-01-11 DIAGNOSIS — R2689 Other abnormalities of gait and mobility: Secondary | ICD-10-CM | POA: Diagnosis not present

## 2017-01-11 DIAGNOSIS — E569 Vitamin deficiency, unspecified: Secondary | ICD-10-CM | POA: Diagnosis not present

## 2017-01-11 DIAGNOSIS — S82831A Other fracture of upper and lower end of right fibula, initial encounter for closed fracture: Secondary | ICD-10-CM | POA: Diagnosis not present

## 2017-01-11 DIAGNOSIS — I10 Essential (primary) hypertension: Secondary | ICD-10-CM | POA: Diagnosis not present

## 2017-01-11 DIAGNOSIS — S82431D Displaced oblique fracture of shaft of right fibula, subsequent encounter for closed fracture with routine healing: Secondary | ICD-10-CM | POA: Diagnosis not present

## 2017-01-11 DIAGNOSIS — D649 Anemia, unspecified: Secondary | ICD-10-CM | POA: Diagnosis not present

## 2017-01-11 DIAGNOSIS — S065X0A Traumatic subdural hemorrhage without loss of consciousness, initial encounter: Secondary | ICD-10-CM | POA: Diagnosis not present

## 2017-01-11 DIAGNOSIS — S065X0D Traumatic subdural hemorrhage without loss of consciousness, subsequent encounter: Secondary | ICD-10-CM | POA: Diagnosis not present

## 2017-01-11 DIAGNOSIS — M329 Systemic lupus erythematosus, unspecified: Secondary | ICD-10-CM | POA: Diagnosis not present

## 2017-01-11 DIAGNOSIS — Z471 Aftercare following joint replacement surgery: Secondary | ICD-10-CM | POA: Diagnosis not present

## 2017-01-11 DIAGNOSIS — S82832A Other fracture of upper and lower end of left fibula, initial encounter for closed fracture: Secondary | ICD-10-CM | POA: Diagnosis not present

## 2017-01-11 DIAGNOSIS — R278 Other lack of coordination: Secondary | ICD-10-CM | POA: Diagnosis not present

## 2017-01-11 DIAGNOSIS — R52 Pain, unspecified: Secondary | ICD-10-CM | POA: Diagnosis not present

## 2017-01-11 DIAGNOSIS — Z9181 History of falling: Secondary | ICD-10-CM | POA: Diagnosis not present

## 2017-01-11 DIAGNOSIS — W132XXA Fall from, out of or through roof, initial encounter: Secondary | ICD-10-CM | POA: Diagnosis not present

## 2017-01-11 DIAGNOSIS — S0990XA Unspecified injury of head, initial encounter: Secondary | ICD-10-CM | POA: Diagnosis not present

## 2017-01-11 DIAGNOSIS — H04129 Dry eye syndrome of unspecified lacrimal gland: Secondary | ICD-10-CM | POA: Diagnosis not present

## 2017-01-11 DIAGNOSIS — S5290XA Unspecified fracture of unspecified forearm, initial encounter for closed fracture: Secondary | ICD-10-CM | POA: Diagnosis not present

## 2017-01-11 DIAGNOSIS — M79642 Pain in left hand: Secondary | ICD-10-CM | POA: Diagnosis not present

## 2017-01-11 DIAGNOSIS — Z111 Encounter for screening for respiratory tuberculosis: Secondary | ICD-10-CM | POA: Diagnosis not present

## 2017-01-11 DIAGNOSIS — Z23 Encounter for immunization: Secondary | ICD-10-CM | POA: Diagnosis not present

## 2017-01-11 DIAGNOSIS — S82401A Unspecified fracture of shaft of right fibula, initial encounter for closed fracture: Secondary | ICD-10-CM | POA: Diagnosis not present

## 2017-01-11 DIAGNOSIS — K219 Gastro-esophageal reflux disease without esophagitis: Secondary | ICD-10-CM | POA: Diagnosis not present

## 2017-01-11 DIAGNOSIS — M6281 Muscle weakness (generalized): Secondary | ICD-10-CM | POA: Diagnosis not present

## 2017-01-11 DIAGNOSIS — S62647A Nondisplaced fracture of proximal phalanx of left little finger, initial encounter for closed fracture: Secondary | ICD-10-CM | POA: Diagnosis not present

## 2017-01-11 DIAGNOSIS — Y92008 Other place in unspecified non-institutional (private) residence as the place of occurrence of the external cause: Secondary | ICD-10-CM | POA: Diagnosis not present

## 2017-01-11 DIAGNOSIS — E785 Hyperlipidemia, unspecified: Secondary | ICD-10-CM | POA: Diagnosis not present

## 2017-01-11 DIAGNOSIS — S62607A Fracture of unspecified phalanx of left little finger, initial encounter for closed fracture: Secondary | ICD-10-CM | POA: Diagnosis not present

## 2017-01-11 MED ORDER — DOCUSATE SODIUM 100 MG PO CAPS: 100.0000 mg | ORAL_CAPSULE | Freq: Two times a day (BID) | ORAL | 0 refills | Status: DC

## 2017-01-11 MED ORDER — ACETAMINOPHEN 500 MG PO TABS: 1000.0000 mg | ORAL_TABLET | Freq: Three times a day (TID) | ORAL | 0 refills | Status: DC

## 2017-01-11 MED ORDER — METOPROLOL TARTRATE 25 MG PO TABS: 12.5000 mg | ORAL_TABLET | Freq: Two times a day (BID) | ORAL | Status: DC

## 2017-01-11 MED ORDER — TRAMADOL HCL 50 MG PO TABS
50.0000 mg | ORAL_TABLET | Freq: Two times a day (BID) | ORAL | 0 refills | Status: DC
Start: 2017-01-11 — End: 2017-01-11

## 2017-01-11 MED ORDER — AMLODIPINE BESYLATE 5 MG PO TABS: 5.0000 mg | ORAL_TABLET | Freq: Every day | ORAL | Status: AC

## 2017-01-11 MED ORDER — TRAMADOL HCL 50 MG PO TABS: 50.0000 mg | ORAL_TABLET | Freq: Two times a day (BID) | ORAL | 0 refills | Status: DC

## 2017-01-11 NOTE — Clinical Social Work Note (Signed)
Clinical Social Worker facilitated patient discharge including contacting patient family and facility to confirm patient discharge plans.  Clinical information faxed to facility and family agreeable with plan.  CSW arranged ambulance transport via [facility] to [facility].  RN to call report prior to discharge.  Clinical Social Worker will sign off for now as social work intervention is no longer needed. Please consult [place] again if new need arises.  Barbette Or, [place]

## 2017-01-11 NOTE — Clinical Social Work Placement (Signed)
   CLINICAL SOCIAL WORK PLACEMENT  NOTE  Date:  01/11/2017  Patient Details  Name: Tyler Norris MRN: [id number] Date of Birth: [birth year]  Clinical Social Work is seeking post-discharge placement for this patient at the [place] level of care (*CSW will initial, date and re-position this form in  chart as items are completed):  Yes   Patient/family provided with [facility] Work Department's list of facilities offering this level of care within the geographic area requested by the patient (or if unable, by the patient's family).  Yes   Patient/family informed of their freedom to choose among providers that offer the needed level of care, that participate in Medicare, Medicaid or managed care program needed by the patient, have an available bed and are willing to accept the patient.  Yes   Patient/family informed of [facility]'s ownership interest in [facility] and [facility], as well as of the fact that they are under no obligation to receive care at these facilities.  PASRR submitted to EDS on 01/09/17     PASRR number received on 01/09/17     Existing PASRR number confirmed on       FL2 transmitted to all facilities in geographic area requested by pt/family on 01/09/17     FL2 transmitted to all facilities within larger geographic area on       Patient informed that his/her managed care company has contracts with or will negotiate with certain facilities, including the following:        Yes   Patient/family informed of bed offers received.  Patient chooses bed at [facility]     Physician recommends and patient chooses bed at      Patient to be transferred to [facility] on 01/11/17.  Patient to be transferred to facility by Ambulance     Patient family notified on 01/11/17 of transfer.  Name of family member notified:  Family notified at bedside     PHYSICIAN       Additional Comment:   Barbette Or,  [place]

## 2017-01-11 NOTE — Progress Notes (Signed)
All discharge instructions reviewed with patient and patient family. All patient questions answered. Patient ensured that all equipment needed will be provided by SNF upon their assessment, which was confirmed by SW. Report provided to [facility] SNF. Patients RUA peripheral IV removed. CCMD notified of patient d/c. Patient transferred from recliner to stretcher, stand and pivot with 2 assist with no difficulties. All d/c instructions and signed Rx given to PTAR.

## 2017-01-11 NOTE — Progress Notes (Signed)
Patient family/visitor informed that all equipment, flowers, personal belongings will need to be taken to SNF and patient will be transported via ambulance once bed confirmation is received. SW will provide confirmation. RN will continue to monitor patient.

## 2017-01-11 NOTE — Progress Notes (Signed)
Patient is discharged from room 4NP04 and is to be transferred to [facility]. Report given to receiving nurse Webb Silversmith, LPN with all questions answered. Awaiting PTAR at this time.

## 2017-01-11 NOTE — Progress Notes (Signed)
Physical Therapy Treatment Patient Details Name: Tyler Norris MRN: [id number] DOB: 1934/05/09 Today's Date: 01/11/2017    History of Present Illness 82 yo male getting down from roof fell when ladder slipped. Pt admitted with SDH, Rt ankle fx, left 5th phalanx fx. PMHx: Lupus, AAA, fibromyalgia, HTN, arthritis    PT Comments    Pt pleasant and joking but continues to demonstrate decreased cognition, safety and function. Pt with increased transfers and gait with education for HEP and progression. Will continue to follow. Pt with SpO2 88-92% on RA with gait with return to 1L end of session 96% with incentive spirometer provided and pt educated for use with 5 trials performed and pt reaching 1565mL    Follow Up Recommendations  SNF;Supervision/Assistance - 24 hour     Equipment Recommendations  Wheelchair (measurements PT);Wheelchair cushion (measurements PT);Other (comment)(left platform RW)    Recommendations for Other Services       Precautions / Restrictions Precautions Precautions: Fall Restrictions Weight Bearing Restrictions: Yes LUE Weight Bearing: Weight bear through elbow only RLE Weight Bearing: Non weight bearing    Mobility  Bed Mobility Overal bed mobility: Needs Assistance       Supine to sit: Supervision     General bed mobility comments: supervision for safety and maintain weight bearing restrictions  Transfers Overall transfer level: Needs assistance Equipment used: Left platform walker Transfers: Sit to/from Stand Sit to Stand: +2 safety/equipment;Min assist         General transfer comment: cues for sequence with pt pushing up from bed on RUE and P.T. guarding RLE with foot under pt's foot. Pt able to stand without weight on RLE and return to sitting  Ambulation/Gait Ambulation/Gait assistance: Min assist;+2 safety/equipment Ambulation Distance (Feet): 18 Feet Assistive device: Left platform walker Gait Pattern/deviations: Step-to  pattern   Gait velocity interpretation: Below normal speed for age/gender General Gait Details: chair to follow with cues and assist for directing RW and balance, limited by fatigue   Stairs            Wheelchair Mobility    Modified Rankin (Stroke Patients Only)       Balance     Sitting balance-Leahy Scale: Fair     Standing balance support: Bilateral upper extremity supported Standing balance-Leahy Scale: Poor                              Cognition Arousal/Alertness: Awake/alert Behavior During Therapy: WFL for tasks assessed/performed Overall Cognitive Status: Impaired/Different from baseline Area of Impairment: Safety/judgement;Problem solving;Awareness;Attention                   Current Attention Level: Selective Memory: Decreased recall of precautions;Decreased short-term memory   Safety/Judgement: Decreased awareness of safety   Problem Solving: Slow processing;Requires verbal cues General Comments: pt with tangential statements at times with cues to redirect to task, decreased safety and awareness      Exercises General Exercises - Lower Extremity Long Arc Quad: AROM;Both;15 reps;Seated Hip Flexion/Marching: AROM;Both;15 reps;Seated    General Comments        Pertinent Vitals/Pain Pain Assessment: 0-10 Pain Score: 4  Pain Location: left hand Pain Descriptors / Indicators: Aching Pain Intervention(s): Limited activity within patient's tolerance;Repositioned;Monitored during session;Premedicated before session    Home Living                      Prior Function  PT Goals (current goals can now be found in the care plan section) Progress towards PT goals: Progressing toward goals    Frequency           PT Plan Current plan remains appropriate    Co-evaluation              AM-PAC PT "6 Clicks" Daily Activity  Outcome Measure  Difficulty turning over in bed (including adjusting  bedclothes, sheets and blankets)?: A Little Difficulty moving from lying on back to sitting on the side of the bed? : A Lot Difficulty sitting down on and standing up from a chair with arms (e.g., wheelchair, bedside commode, etc,.)?: Unable Help needed moving to and from a bed to chair (including a wheelchair)?: A Lot Help needed walking in hospital room?: A Little Help needed climbing 3-5 steps with a railing? : Total 6 Click Score: 12    End of Session Equipment Utilized During Treatment: Gait belt Activity Tolerance: Patient tolerated treatment well Patient left: in chair;with call bell/phone within reach;with chair alarm set;with family/visitor present Nurse Communication: Mobility status;Weight bearing status PT Visit Diagnosis: Other abnormalities of gait and mobility (R26.89);Muscle weakness (generalized) (M62.81)     Time: 2542-7062 PT Time Calculation (min) (ACUTE ONLY): 26 min  Charges:  $Gait Training: 8-22 mins $Therapeutic Exercise: 8-22 mins                    G Codes:       Elwyn Reach, PT [phone]    [place] 01/11/2017, 10:28 AM

## 2017-01-11 NOTE — Discharge Summary (Signed)
[facility] Discharge Summary   Patient ID: Tyler Norris MRN: [id number] DOB/AGE: 80-10-1934 80 y.o.  Admit date: 01/07/2017 Discharge date: 01/11/2017  Admitting Diagnosis: Fall SDH Right Weber B ankle fracture Left proximal phalanx fracture of small finger  Discharge Diagnosis Patient Active Problem List   Diagnosis Date Noted  . SDH (subdural hematoma) ([place]) 01/08/2017  . Pleural effusion on left 06/02/2015  . Aneurysm of thoracic aorta ([place]) 06/02/2015  . SOB (shortness of breath) 08/22/2014  . Pericardial effusion 08/22/2014  . Lupus (systemic lupus erythematosus) ([place])   . 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 PE (pulmonary embolism) 06/05/2012  . Pleural effusion exudative 05/26/2012  . Essential hypertension 05/13/2012  . OSA on CPAP 05/13/2012  . Cardiomegaly 05/13/2012  . Abdominal aneurysm without mention of rupture 06/27/2011    Consultants Orthopedics Neurosurgery  Imaging: No results found.  Procedures None  Hospital Course:  BARNETT ELZEY is an 82 yo male who presented to [facility] 11/12 after falling from his roof.  Patient was getting down from his ladder when it slipped from under him and he fell. No LOC. Complaining of head, neck, left hand, right foot, and left foot pain. Workup showed right distal fibula fracture, small SDH, and left small finger fracture. Patient was admitted to trauma for pain control and observation. Neurosurgery consulted for SDH and recommended no intervention and no follow up. Orthopedics was consulted for right ankle fracture and left small finger fracture; they recommended nonoperative management for both fractures in splints with 2 week follow up, NWB RLE and NWB LUE.  Patient did have issues with hypertension initially requiring nicardipine drip. Once he became more stable he was transitioned to PO lopressor and norvasc. Patient worked with therapies during this admission. On 11/15, the patient  was voiding well, tolerating diet, mobilizing well, pain well controlled, vital signs stable and felt stable for discharge to SNF.  Patient will follow up with orthopedics in 2 weeks, and with his primary care physician for hypertension management, and knows to call with questions or concerns.     I have personally reviewed the patients medication history on the [place] controlled substance database.    Physical Exam: Gen:  Alert, NAD, pleasant HEENT: EOM's intact, pupils equal and round, large forehead hematoma with skin tact Card:  RRR, no M/G/R heard Pulm:  CTAB, no W/R/R, effort normal Abd: Soft, NT/ND, +BS, no HSM, no hernia Ext:  Splint to RLE, toes WWP, able to wiggle toes. Ulnar gutter splint to LUE Psych: A&Ox3  Skin: no rashes noted, warm and dry    Allergies as of 01/11/2017      Reactions   Atorvastatin Other (See Comments)   Myalgias   Lipitor [atorvastatin Calcium]    Myalgias      Medication List    TAKE these medications   acetaminophen 500 MG tablet Commonly known as:  TYLENOL Take 2 tablets (1,000 mg total) every 8 (eight) hours by mouth.   amLODipine 5 MG tablet Commonly known as:  NORVASC Take 1 tablet (5 mg total) daily by mouth.   aspirin 81 MG tablet Take 81 mg by mouth daily.   BIOFLEX PO Take 2 tablets by mouth daily.   CALCIUM PO Take 1 tablet daily by mouth.   docusate sodium 100 MG capsule Commonly known as:  COLACE Take 1 capsule (100 mg total) 2 (two) times daily by mouth.   esomeprazole 40 MG capsule Commonly known as:  NEXIUM Take 40 mg  by mouth daily at 12 noon.   fenofibrate 145 MG tablet Commonly known as:  TRICOR Take 145 mg by mouth daily.   ferrous sulfate 325 (65 FE) MG tablet Take 325 mg by mouth daily with breakfast.   Fish Oil 1200 MG Caps Take 1,200 mg by mouth daily.   folic acid 409 MCG tablet Commonly known as:  FOLVITE Take 400 mcg by mouth daily.   hydroxypropyl methylcellulose / hypromellose 2.5 % ophthalmic  solution Commonly known as:  ISOPTO TEARS / GONIOVISC Place 1 drop as needed into both eyes for dry eyes.   magnesium oxide 400 (241.3 Mg) MG tablet Commonly known as:  MAG-OX Take 1 tablet (400 mg total) by mouth 3 (three) times daily.   metoprolol tartrate 25 MG tablet Commonly known as:  LOPRESSOR Take 0.5 tablets (12.5 mg total) 2 (two) times daily by mouth.   multivitamin with minerals Tabs tablet Take 1 tablet by mouth daily.   PLAQUENIL 200 MG tablet Generic drug:  hydroxychloroquine Take 200 mg by mouth 2 (two) times daily.   rosuvastatin 5 MG tablet Commonly known as:  CRESTOR Take 2.5 mg by mouth daily.   traMADol 50 MG tablet Commonly known as:  ULTRAM Take 1 tablet (50 mg total) every 12 (twelve) hours by mouth.   vitamin C 500 MG tablet Commonly known as:  ASCORBIC ACID Take 1,000 mg by mouth daily.   Vitamin D3 5000 units Caps Take 5,000 Units by mouth daily.   vitamin E 400 UNIT capsule Take 400 Units by mouth daily.            Durable Medical Equipment  (From admission, onward)        Start     Ordered   01/11/17 0955  For home use only DME wheelchair cushion (seat and back)  Once     01/11/17 0954   01/11/17 0954  For home use only DME Walker platform  Once    Comments:  Left platform rolling walker with wheelchair cushion  Question:  Patient needs a walker to treat with the following condition  Answer:  Closed right ankle fracture   01/11/17 8119       Follow-up Information    Netta Cedars, MD. Call in 2 week(s).   Specialty:  Orthopedic Surgery Why:  Call as soon as you leave the hospital to arranage orthopedic follow up in about 2 weeks regarding your ankle and finger fractures Contact information: [address] [place] [address] [phone]        [name], MD. Call in 1 week(s).   Specialty:  Family Medicine Why:  Call to make a follow up appointment with your primary care physician regarding  your hypertension medications Contact information: [address] [place] [address] [phone]           Signed: Wellington Hampshire, [facility] 01/11/2017, 9:45 AM Pager: [phone] Consults: [phone] Mon-Fri 7:00 am-4:30 pm Sat-Sun 7:00 am-11:30 am

## 2017-01-11 NOTE — Care Management Note (Signed)
Case Management Note  Patient Details  Name: Tyler Norris MRN: [id number] Date of Birth: 1934/03/25  Subjective/Objective:  Pt admitted on 01/07/17 after falling from his roof, sustaining SDH, Rt ankle fx, and Lt 5th phalanx fx.  PTA, pt independent, lives alone.  Pt's wife is in a nursing facility after suffering a stroke.                    Action/Plan: PT/OT recommending SNF; CSW consulted to facilitate possible dc to SNF upon medical stability.  Will follow progress.    Expected Discharge Date:  01/11/17               Expected Discharge Plan:  [place]  In-House Referral:  Clinical Social Work  Discharge planning Services  CM Consult  Post Acute Care Choice:    Choice offered to:     DME Arranged:    DME Agency:     HH Arranged:    [place] Agency:     Status of Service:  Completed, signed off  If discussed at [employer], dates discussed:    Additional Comments:  01/11/17 J. [name], RN, BSN Pt medically stable for discharge to SNF today, per CSW arrangements.    Reinaldo Raddle, RN, BSN  Trauma/Neuro ICU Case Manager [phone]

## 2017-01-12 DIAGNOSIS — I10 Essential (primary) hypertension: Secondary | ICD-10-CM | POA: Diagnosis not present

## 2017-01-12 DIAGNOSIS — M6281 Muscle weakness (generalized): Secondary | ICD-10-CM | POA: Diagnosis not present

## 2017-01-12 DIAGNOSIS — S62607A Fracture of unspecified phalanx of left little finger, initial encounter for closed fracture: Secondary | ICD-10-CM | POA: Diagnosis not present

## 2017-01-12 DIAGNOSIS — Z471 Aftercare following joint replacement surgery: Secondary | ICD-10-CM | POA: Diagnosis not present

## 2017-01-12 DIAGNOSIS — S82401A Unspecified fracture of shaft of right fibula, initial encounter for closed fracture: Secondary | ICD-10-CM | POA: Diagnosis not present

## 2017-01-12 DIAGNOSIS — E785 Hyperlipidemia, unspecified: Secondary | ICD-10-CM | POA: Diagnosis not present

## 2017-01-12 DIAGNOSIS — D649 Anemia, unspecified: Secondary | ICD-10-CM | POA: Diagnosis not present

## 2017-01-12 DIAGNOSIS — K219 Gastro-esophageal reflux disease without esophagitis: Secondary | ICD-10-CM | POA: Diagnosis not present

## 2017-01-12 DIAGNOSIS — E569 Vitamin deficiency, unspecified: Secondary | ICD-10-CM | POA: Diagnosis not present

## 2017-01-12 DIAGNOSIS — R52 Pain, unspecified: Secondary | ICD-10-CM | POA: Diagnosis not present

## 2017-01-12 DIAGNOSIS — K5900 Constipation, unspecified: Secondary | ICD-10-CM | POA: Diagnosis not present

## 2017-01-12 DIAGNOSIS — M329 Systemic lupus erythematosus, unspecified: Secondary | ICD-10-CM | POA: Diagnosis not present

## 2017-01-15 DIAGNOSIS — D649 Anemia, unspecified: Secondary | ICD-10-CM | POA: Diagnosis not present

## 2017-01-15 DIAGNOSIS — E569 Vitamin deficiency, unspecified: Secondary | ICD-10-CM | POA: Diagnosis not present

## 2017-01-15 DIAGNOSIS — M6281 Muscle weakness (generalized): Secondary | ICD-10-CM | POA: Diagnosis not present

## 2017-01-15 DIAGNOSIS — E785 Hyperlipidemia, unspecified: Secondary | ICD-10-CM | POA: Diagnosis not present

## 2017-01-15 DIAGNOSIS — K219 Gastro-esophageal reflux disease without esophagitis: Secondary | ICD-10-CM | POA: Diagnosis not present

## 2017-01-15 DIAGNOSIS — M329 Systemic lupus erythematosus, unspecified: Secondary | ICD-10-CM | POA: Diagnosis not present

## 2017-01-15 DIAGNOSIS — S62607A Fracture of unspecified phalanx of left little finger, initial encounter for closed fracture: Secondary | ICD-10-CM | POA: Diagnosis not present

## 2017-01-15 DIAGNOSIS — S82401A Unspecified fracture of shaft of right fibula, initial encounter for closed fracture: Secondary | ICD-10-CM | POA: Diagnosis not present

## 2017-01-15 DIAGNOSIS — I10 Essential (primary) hypertension: Secondary | ICD-10-CM | POA: Diagnosis not present

## 2017-01-15 DIAGNOSIS — Z471 Aftercare following joint replacement surgery: Secondary | ICD-10-CM | POA: Diagnosis not present

## 2017-01-15 DIAGNOSIS — K5900 Constipation, unspecified: Secondary | ICD-10-CM | POA: Diagnosis not present

## 2017-01-15 DIAGNOSIS — R52 Pain, unspecified: Secondary | ICD-10-CM | POA: Diagnosis not present

## 2017-01-23 DIAGNOSIS — S82832A Other fracture of upper and lower end of left fibula, initial encounter for closed fracture: Secondary | ICD-10-CM | POA: Diagnosis not present

## 2017-01-23 DIAGNOSIS — S62647A Nondisplaced fracture of proximal phalanx of left little finger, initial encounter for closed fracture: Secondary | ICD-10-CM | POA: Diagnosis not present

## 2017-01-25 DIAGNOSIS — R52 Pain, unspecified: Secondary | ICD-10-CM | POA: Diagnosis not present

## 2017-01-25 DIAGNOSIS — M6281 Muscle weakness (generalized): Secondary | ICD-10-CM | POA: Diagnosis not present

## 2017-01-25 DIAGNOSIS — E569 Vitamin deficiency, unspecified: Secondary | ICD-10-CM | POA: Diagnosis not present

## 2017-01-25 DIAGNOSIS — I10 Essential (primary) hypertension: Secondary | ICD-10-CM | POA: Diagnosis not present

## 2017-01-25 DIAGNOSIS — K219 Gastro-esophageal reflux disease without esophagitis: Secondary | ICD-10-CM | POA: Diagnosis not present

## 2017-01-25 DIAGNOSIS — S62607A Fracture of unspecified phalanx of left little finger, initial encounter for closed fracture: Secondary | ICD-10-CM | POA: Diagnosis not present

## 2017-01-25 DIAGNOSIS — D649 Anemia, unspecified: Secondary | ICD-10-CM | POA: Diagnosis not present

## 2017-01-25 DIAGNOSIS — K5900 Constipation, unspecified: Secondary | ICD-10-CM | POA: Diagnosis not present

## 2017-01-25 DIAGNOSIS — Z471 Aftercare following joint replacement surgery: Secondary | ICD-10-CM | POA: Diagnosis not present

## 2017-01-25 DIAGNOSIS — M329 Systemic lupus erythematosus, unspecified: Secondary | ICD-10-CM | POA: Diagnosis not present

## 2017-01-25 DIAGNOSIS — E785 Hyperlipidemia, unspecified: Secondary | ICD-10-CM | POA: Diagnosis not present

## 2017-01-25 DIAGNOSIS — S82401A Unspecified fracture of shaft of right fibula, initial encounter for closed fracture: Secondary | ICD-10-CM | POA: Diagnosis not present

## 2017-02-01 DIAGNOSIS — S62607A Fracture of unspecified phalanx of left little finger, initial encounter for closed fracture: Secondary | ICD-10-CM | POA: Diagnosis not present

## 2017-02-01 DIAGNOSIS — R52 Pain, unspecified: Secondary | ICD-10-CM | POA: Diagnosis not present

## 2017-02-01 DIAGNOSIS — M6281 Muscle weakness (generalized): Secondary | ICD-10-CM | POA: Diagnosis not present

## 2017-02-01 DIAGNOSIS — S82401A Unspecified fracture of shaft of right fibula, initial encounter for closed fracture: Secondary | ICD-10-CM | POA: Diagnosis not present

## 2017-02-09 DIAGNOSIS — R52 Pain, unspecified: Secondary | ICD-10-CM | POA: Diagnosis not present

## 2017-02-09 DIAGNOSIS — Z471 Aftercare following joint replacement surgery: Secondary | ICD-10-CM | POA: Diagnosis not present

## 2017-02-09 DIAGNOSIS — I10 Essential (primary) hypertension: Secondary | ICD-10-CM | POA: Diagnosis not present

## 2017-02-09 DIAGNOSIS — S82401A Unspecified fracture of shaft of right fibula, initial encounter for closed fracture: Secondary | ICD-10-CM | POA: Diagnosis not present

## 2017-02-09 DIAGNOSIS — K5900 Constipation, unspecified: Secondary | ICD-10-CM | POA: Diagnosis not present

## 2017-02-09 DIAGNOSIS — D649 Anemia, unspecified: Secondary | ICD-10-CM | POA: Diagnosis not present

## 2017-02-09 DIAGNOSIS — S62607A Fracture of unspecified phalanx of left little finger, initial encounter for closed fracture: Secondary | ICD-10-CM | POA: Diagnosis not present

## 2017-02-09 DIAGNOSIS — E569 Vitamin deficiency, unspecified: Secondary | ICD-10-CM | POA: Diagnosis not present

## 2017-02-09 DIAGNOSIS — M6281 Muscle weakness (generalized): Secondary | ICD-10-CM | POA: Diagnosis not present

## 2017-02-09 DIAGNOSIS — E785 Hyperlipidemia, unspecified: Secondary | ICD-10-CM | POA: Diagnosis not present

## 2017-02-09 DIAGNOSIS — K219 Gastro-esophageal reflux disease without esophagitis: Secondary | ICD-10-CM | POA: Diagnosis not present

## 2017-02-09 DIAGNOSIS — M329 Systemic lupus erythematosus, unspecified: Secondary | ICD-10-CM | POA: Diagnosis not present

## 2017-02-21 DIAGNOSIS — S62647D Nondisplaced fracture of proximal phalanx of left little finger, subsequent encounter for fracture with routine healing: Secondary | ICD-10-CM | POA: Diagnosis not present

## 2017-02-21 DIAGNOSIS — M329 Systemic lupus erythematosus, unspecified: Secondary | ICD-10-CM | POA: Diagnosis not present

## 2017-02-21 DIAGNOSIS — E785 Hyperlipidemia, unspecified: Secondary | ICD-10-CM | POA: Diagnosis not present

## 2017-02-21 DIAGNOSIS — F418 Other specified anxiety disorders: Secondary | ICD-10-CM | POA: Diagnosis not present

## 2017-02-21 DIAGNOSIS — S82891D Other fracture of right lower leg, subsequent encounter for closed fracture with routine healing: Secondary | ICD-10-CM | POA: Diagnosis not present

## 2017-02-21 DIAGNOSIS — K2270 Barrett's esophagus without dysplasia: Secondary | ICD-10-CM | POA: Diagnosis not present

## 2017-02-21 DIAGNOSIS — M1990 Unspecified osteoarthritis, unspecified site: Secondary | ICD-10-CM | POA: Diagnosis not present

## 2017-02-21 DIAGNOSIS — S82831D Other fracture of upper and lower end of right fibula, subsequent encounter for closed fracture with routine healing: Secondary | ICD-10-CM | POA: Diagnosis not present

## 2017-02-21 DIAGNOSIS — I10 Essential (primary) hypertension: Secondary | ICD-10-CM | POA: Diagnosis not present

## 2017-02-21 DIAGNOSIS — I2510 Atherosclerotic heart disease of native coronary artery without angina pectoris: Secondary | ICD-10-CM | POA: Diagnosis not present

## 2017-02-21 DIAGNOSIS — S62607D Fracture of unspecified phalanx of left little finger, subsequent encounter for fracture with routine healing: Secondary | ICD-10-CM | POA: Diagnosis not present

## 2017-02-21 DIAGNOSIS — E119 Type 2 diabetes mellitus without complications: Secondary | ICD-10-CM | POA: Diagnosis not present

## 2017-02-21 DIAGNOSIS — S065X9A Traumatic subdural hemorrhage with loss of consciousness of unspecified duration, initial encounter: Secondary | ICD-10-CM | POA: Diagnosis not present

## 2017-02-21 DIAGNOSIS — G4733 Obstructive sleep apnea (adult) (pediatric): Secondary | ICD-10-CM | POA: Diagnosis not present

## 2017-03-12 ENCOUNTER — Ambulatory Visit (INDEPENDENT_AMBULATORY_CARE_PROVIDER_SITE_OTHER): Payer: Medicare Other | Admitting: Pulmonary Disease

## 2017-03-12 ENCOUNTER — Encounter: Payer: Self-pay | Admitting: Pulmonary Disease

## 2017-03-12 DIAGNOSIS — G4733 Obstructive sleep apnea (adult) (pediatric): Secondary | ICD-10-CM

## 2017-03-12 DIAGNOSIS — Z9989 Dependence on other enabling machines and devices: Secondary | ICD-10-CM

## 2017-03-12 DIAGNOSIS — J90 Pleural effusion, not elsewhere classified: Secondary | ICD-10-CM

## 2017-03-12 NOTE — Assessment & Plan Note (Signed)
Resolved with no evidence of recurrence. Continue on Plaquenil

## 2017-03-12 NOTE — Assessment & Plan Note (Signed)
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 Advised against medications with sedative side effects Cautioned against driving when sleepy - understanding that sleepiness will vary on a day to day basis

## 2017-03-12 NOTE — Progress Notes (Signed)
   Subjective:    Patient ID: Tyler Norris, male    DOB: April 28, 1934, 83 y.o.   MRN: [id number]  HPI  83 yo never smoker with SLE diagnosed 2041for FU of OSA .  Was on INH for Quantiferon test -positive , finished course from 12/2012 . Followed by Estanislado Pandy for SLE, on Plaquenil . Off prednisone. Pleural effusions resolved. H/o pulmonary embolism 05/2012 - finished coumadin course 02/2013 .   03/12/2017  Annual follow-up. His wife was in a memory care unfortunately passed away after strokes.  He had a fall from a ladder, fortunately no injuries. Reviewed chest x-ray 01/07/17 was did not show any infiltrates or effusions. Breathing has been stable.  CPAP has been working well.  He had his current machine for more than 5 years and wonders if he needs a new machine. He has a soaking machine and this seems to work well, predominantly uses nasal pillows. No problems with mask or pressure.  Download from 2017 showed CPAP of 9 cm with good control of events and excellent compliance   Significant tests/ events  Thoracentesis 04/2012 of 230 mL of monocytic exudative fluid.   CT angiogram on 06/05/12 showed small subsegmental pulmonary emboli in the right upper lobe, right middle lobe and left lower lobe. A right effusion was noted somewhat loculated and a small left effusion and was layering.    Adm 07/2014 -Pericardial effusion with near tamponade, picked up on CT angio , resolved on FU 10/2014 - on NSAIDs   PFTs - 04/2014 - unchanged, FEV1 79%, FVC 78% improves to >80% with BDs, TLC 68%, DLCO 55%  05/2015 He developed left pleural effusion and underwent thoracentesis -about 60 mL >> monocytic exudate as before>> plaquanil added  Review of Systems neg for any significant sore throat, dysphagia, itching, sneezing, nasal congestion or excess/ purulent secretions, fever, chills, sweats, unintended wt loss, pleuritic or exertional cp, hempoptysis, orthopnea pnd or change in chronic  leg swelling. Also denies presyncope, palpitations, heartburn, abdominal pain, nausea, vomiting, diarrhea or change in bowel or urinary habits, dysuria,hematuria, rash, arthralgias, visual complaints, headache, numbness weakness or ataxia.     Objective:   Physical Exam  Gen. Pleasant, well-nourished, in no distress ENT - no thrush, no post nasal drip Neck: No JVD, no thyromegaly, no carotid bruits Lungs: no use of accessory muscles, no dullness to percussion, clear without rales or rhonchi  Cardiovascular: Rhythm regular, heart sounds  normal, no murmurs or gallops, no peripheral edema Musculoskeletal: No deformities, no cyanosis or clubbing        Assessment & Plan:

## 2017-03-12 NOTE — Patient Instructions (Signed)
Obtain CPAP download report from [facility] and we will ensure that settings are working well

## 2017-03-27 DIAGNOSIS — S82821D Torus fracture of lower end of right fibula, subsequent encounter for fracture with routine healing: Secondary | ICD-10-CM | POA: Diagnosis not present

## 2017-03-27 DIAGNOSIS — M25571 Pain in right ankle and joints of right foot: Secondary | ICD-10-CM | POA: Insufficient documentation

## 2017-03-28 DIAGNOSIS — H16043 Marginal corneal ulcer, bilateral: Secondary | ICD-10-CM | POA: Diagnosis not present

## 2017-03-28 DIAGNOSIS — H5712 Ocular pain, left eye: Secondary | ICD-10-CM | POA: Diagnosis not present

## 2017-03-28 DIAGNOSIS — B0052 Herpesviral keratitis: Secondary | ICD-10-CM | POA: Diagnosis not present

## 2017-03-28 DIAGNOSIS — H16202 Unspecified keratoconjunctivitis, left eye: Secondary | ICD-10-CM | POA: Diagnosis not present

## 2017-03-28 DIAGNOSIS — H0100A Unspecified blepharitis right eye, upper and lower eyelids: Secondary | ICD-10-CM | POA: Diagnosis not present

## 2017-04-02 DIAGNOSIS — B0052 Herpesviral keratitis: Secondary | ICD-10-CM | POA: Diagnosis not present

## 2017-04-02 DIAGNOSIS — H16042 Marginal corneal ulcer, left eye: Secondary | ICD-10-CM | POA: Diagnosis not present

## 2017-04-02 DIAGNOSIS — H0100B Unspecified blepharitis left eye, upper and lower eyelids: Secondary | ICD-10-CM | POA: Diagnosis not present

## 2017-04-02 DIAGNOSIS — H0100A Unspecified blepharitis right eye, upper and lower eyelids: Secondary | ICD-10-CM | POA: Diagnosis not present

## 2017-04-10 ENCOUNTER — Telehealth: Payer: Self-pay | Admitting: Adult Health

## 2017-04-10 NOTE — Telephone Encounter (Signed)
He wanted to check to see if I knew any [place] in the area. No clinical questions .

## 2017-04-10 NOTE — Telephone Encounter (Signed)
I called pt to see if I could get the information to relay to TP but he refused and would not give me any information. Please advise TP.

## 2017-04-11 DIAGNOSIS — H16042 Marginal corneal ulcer, left eye: Secondary | ICD-10-CM | POA: Diagnosis not present

## 2017-04-11 DIAGNOSIS — B0052 Herpesviral keratitis: Secondary | ICD-10-CM | POA: Diagnosis not present

## 2017-04-11 DIAGNOSIS — H0100B Unspecified blepharitis left eye, upper and lower eyelids: Secondary | ICD-10-CM | POA: Diagnosis not present

## 2017-04-26 DIAGNOSIS — F4323 Adjustment disorder with mixed anxiety and depressed mood: Secondary | ICD-10-CM | POA: Diagnosis not present

## 2017-05-03 NOTE — Progress Notes (Signed)
Office Visit Note  Patient: Tyler Norris             Date of Birth: [birth year]           MRN: [id number]             PCP: [name], MD Referring: [name], MD Visit Date: 05/17/2017 Occupation: @GUAROCC @    Subjective:  Lower extremity swelling  History of Present Illness: Tyler Norris is a 83 y.o. male with history of systemic lupus erythematosus.  Patient states he has been taking Plaquenil 200 mg twice daily daily.  He denies any recent lupus flares.  He denies any mouth sores and no sores.  Denies any swollen lymph nodes.  He states that his photosensitivity has improved.  He denies any symptoms of Raynaud's.  He experiences occasional mild dryness and eye dryness.  He denies any rashes.  He states that he does not have significant fatigue.  Patient states that he has bilateral ankle pain.  He continues to have some neck pain and stiffness.  He has also noticed swelling in his bilateral extremities.  Patient states that he has been exercising every other day.  He states that he fell off a ladder November 2018.  He reports he had a subdural hematoma as well as a fracture of his right leg.  He went to a rehab center for a while. He states his night vision and nearsighted vision have been worsening slightly.  He denies any changes in peripheral vision.  He will be going for his back and eye exam in June 2019.  Activities of Daily Living:  Patient reports morning stiffness for 1 minute.   Patient Denies nocturnal pain.  Difficulty dressing/grooming: Denies Difficulty climbing stairs: Reports Difficulty getting out of chair: Reports Difficulty using hands for taps, buttons, cutlery, and/or writing: Denies   Review of Systems  Constitutional: Positive for fatigue. Negative for night sweats.  HENT: Negative for mouth sores, trouble swallowing, trouble swallowing, mouth dryness and nose dryness.   Eyes: Negative for redness, visual disturbance and dryness.    Respiratory: Negative for cough, hemoptysis, shortness of breath and difficulty breathing.   Cardiovascular: Negative for chest pain, palpitations, hypertension, irregular heartbeat and swelling in legs/feet.  Gastrointestinal: Negative for blood in stool, constipation and diarrhea.  Endocrine: Negative for increased urination.  Genitourinary: Negative for painful urination.  Musculoskeletal: Positive for arthralgias, joint pain and morning stiffness. Negative for joint swelling, myalgias, muscle weakness, muscle tenderness and myalgias.  Skin: Positive for sensitivity to sunlight. Negative for color change, rash, hair loss, nodules/bumps, redness, skin tightness and ulcers.  Allergic/Immunologic: Negative for susceptible to infections.  Neurological: Negative for dizziness, fainting, memory loss, night sweats and weakness.  Hematological: Negative for swollen glands.  Psychiatric/Behavioral: Negative for depressed mood and sleep disturbance. The patient is not nervous/anxious.     PMFS History:  Patient Active Problem List   Diagnosis Date Noted  . SDH (subdural hematoma) ([place]) 01/08/2017  . Pleural effusion on left 06/02/2015  . Aneurysm of thoracic aorta ([place]) 06/02/2015  . SOB (shortness of breath) 08/22/2014  . Pericardial effusion 08/22/2014  . Lupus (systemic lupus erythematosus) ([place])   . 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 PE (pulmonary embolism) 06/05/2012  . Pleural effusion exudative 05/26/2012  . Essential hypertension 05/13/2012  . OSA on CPAP 05/13/2012  . Cardiomegaly 05/13/2012  . Abdominal aneurysm without mention of rupture 06/27/2011    Past Medical History:  Diagnosis Date  .  AAA (abdominal aortic aneurysm) ([place])    a. resolved by ct scan 09-2011  . Anemia   . Arthritis   . Barrett's esophagus   . Carpal tunnel syndrome, bilateral   . Essential hypertension   . Fibromyalgia   . GERD (gastroesophageal reflux disease)   . History of pneumonia    . Hyperlipidemia   . Lupus (systemic lupus erythematosus) ([place])   . Nephrolithiasis   . Pericardial effusion    a. 08/22/2014 Echo: EF 60-65%, mildly dil Ao root (29mm), mildly dil LA, mod-large partially organized pericardial effusion with possible early tamponade - asymptomatic on high dose nsaids-->conservative mgmt;  b. 08/2014 Limited Echo: EF 55-60%, Triv - small effusion.  . Sleep apnea    a. uses cpap every night    Family History  Problem Relation Age of Onset  . Diabetes Mother   . Diabetes Sister   . Stroke Sister   . Healthy Daughter   . Healthy Son    Past Surgical History:  Procedure Laterality Date  . APPENDECTOMY    . CARPAL TUNNEL RELEASE  10/12/2011   Procedure: CARPAL TUNNEL RELEASE;  Surgeon: Cammie Sickle., MD;  Location: [facility] [place] [facility];  Service: Orthopedics;  Laterality: Left;  . CARPAL TUNNEL RELEASE  12/01/2011   Procedure: CARPAL TUNNEL RELEASE;  Surgeon: Cammie Sickle., MD;  Location: [facility];  Service: Orthopedics;  Laterality: Right;  . CYSTOSCOPY W/ STONE MANIPULATION  [id number]  . HAND SURGERY     scar repaired lt hand  . HERNIA REPAIR  1966  . KNEE ARTHROPLASTY    . KNEE ARTHROSCOPY Left   . TONSILLECTOMY     Social History   Social History Narrative  . Not on file     Objective: Vital Signs: BP (!) 162/73 (BP Location: Left Arm, Patient Position: Sitting, Cuff Size: Normal)   Pulse (!) 56   Resp 17   Ht 5\' 9"  (1.753 m)   Wt 215 lb (97.5 kg)   BMI 31.75 kg/m    Physical Exam  Constitutional: He is oriented to person, place, and time. He appears well-developed and well-nourished.  HENT:  Head: Normocephalic and atraumatic.  No oral or nasal ulcers  Eyes: Pupils are equal, round, and reactive to light. Conjunctivae and EOM are normal.  Neck: Normal range of motion. Neck supple.  Cardiovascular: Normal rate, regular rhythm and normal heart sounds.  Pulmonary/Chest: Effort normal and breath sounds  normal.  Abdominal: Soft. Bowel sounds are normal.  Neurological: He is alert and oriented to person, place, and time.  Skin: Skin is warm and dry. Capillary refill takes less than 2 seconds.  Psychiatric: He has a normal mood and affect. His behavior is normal.  Nursing note and vitals reviewed.    Musculoskeletal Exam: C-spine very limited range of motion.  Thoracic kyphosis.  Slight limitation of lumbar range of motion.  No midline spinal tenderness.  No SI joint tenderness.  Shoulder joints, elbow joints, wrist joints, MCPs, PIPs, DIPs good range of motion with no synovitis.  He has synovial thickening of bilateral first, second and third MCPs.  He has PIP and DIP synovial thickening consistent with osteoarthritis.  Hip joints, knee joints, ankle joints, MTPs, PIPs, DIPs good range of motion with no synovitis.  No warmth or effusion of bilateral knees.  He has bilateral knee crepitus.  No tenderness of trochanteric bursa.  He has significant pitting edema bilaterally.  CDAI Exam: No CDAI exam  completed.    Investigation: No additional findings.PLQ eye exam: 08/03/2016 CBC Latest Ref Rng & Units 01/08/2017 01/07/2017 12/11/2016  WBC 4.0 - 10.5 K/uL 12.8(H) 12.7(H) 4.7  Hemoglobin 13.0 - 17.0 g/dL 12.9(L) 13.4 13.8  Hematocrit 39.0 - 52.0 % 38.0(L) 39.4 40.0  Platelets 150 - 400 K/uL 202 180 175   CMP Latest Ref Rng & Units 01/08/2017 01/07/2017 12/11/2016  Glucose 65 - 99 mg/dL 159(H) 147(H) 142(H)  BUN 6 - 20 mg/dL 18 19 16   Creatinine 0.61 - 1.24 mg/dL 1.01 0.93 0.80  Sodium 135 - 145 mmol/L 140 143 143  Potassium 3.5 - 5.1 mmol/L 3.9 3.7 4.1  Chloride 101 - 111 mmol/L 107 109 110  CO2 22 - 32 mmol/L 24 25 23   Calcium 8.9 - 10.3 mg/dL 8.9 9.4 9.2  Total Protein 6.1 - 8.1 g/dL - - 6.4  Total Bilirubin 0.2 - 1.2 mg/dL - - 0.8  Alkaline Phos 38 - 126 U/L - - -  AST 10 - 35 U/L - - 19  ALT 9 - 46 U/L - - 15    Imaging: No results found.  Speciality Comments: PLQ Eye Exam:  08/03/16 WNL @ [facility]. Follow up in 1 year.    Procedures:  No procedures performed Allergies: Atorvastatin and Lipitor [atorvastatin calcium]   Assessment / Plan:     Visit Diagnoses: Other systemic lupus erythematosus with other organ involvement ([place]) - +ANA, +RF: No recent lupus flares.  He takes Plaquenil 200 mg BID.  He does not need any refills at this time.  No mouth or nasal ulcers.  No cervical lymphadenopathy.  No malar rash noted.  He has no synovitis on exam.  He has synovial thickening of her second and third MCPs bilaterally.  He occasionally has sicca symptoms.  No history of Raynaud's.  Autoimmune labs were ordered today.  He will continue on Plaquenil 200 mg twice daily.- Plan: CBC with Differential/Platelet, COMPLETE METABOLIC PANEL WITH GFR, Urinalysis, Routine w reflex microscopic, ANA, C3 and C4, Anti-DNA antibody, double-stranded, Sedimentation rate, VITAMIN D 25 Hydroxy (Vit-D Deficiency, Fractures)  High risk medication use - PLQ eye exam: 08/03/2016.  CBC and CMP are drawn today to monitor for drug toxicity.- Plan: CBC with Differential/Platelet, COMPLETE METABOLIC PANEL WITH GFR  Pitting edema: He has bilateral 2+ pitting edema.  He was advised to follow up with PCP and wear compression stockings bilaterally.    Other medical conditions are listed as follows:  History of gastroesophageal reflux (GERD)  History of sleep apnea  Positive QuantiFERON-TB Gold test  History of pulmonary embolism  History of pleural effusion    Orders: Orders Placed This Encounter  Procedures  . CBC with Differential/Platelet  . COMPLETE METABOLIC PANEL WITH GFR  . Urinalysis, Routine w reflex microscopic  . ANA  . C3 and C4  . Anti-DNA antibody, double-stranded  . Sedimentation rate  . VITAMIN D 25 Hydroxy (Vit-D Deficiency, Fractures)   No orders of the defined types were placed in this encounter.   Face-to-face time spent with patient was 30 minutes. >50%  of time was spent in counseling and coordination of care.  Follow-Up Instructions: Return in about 6 months (around 11/17/2017) for Systemic lupus erythematosus.   Ofilia Neas, PA-C   I examined and evaluated the patient with Hazel Sams PA.  And had no synovitis on my examination.  He had no rash.  We will continue current therapy.  The plan of care was discussed as noted  above.  Bo Merino, MD  Note - This record has been created using [employer].  Chart creation errors have been sought, but may not always  have been located. Such creation errors do not reflect on  the standard of medical care.

## 2017-05-15 DIAGNOSIS — F4323 Adjustment disorder with mixed anxiety and depressed mood: Secondary | ICD-10-CM | POA: Diagnosis not present

## 2017-05-17 ENCOUNTER — Encounter: Payer: Self-pay | Admitting: Rheumatology

## 2017-05-17 ENCOUNTER — Ambulatory Visit (INDEPENDENT_AMBULATORY_CARE_PROVIDER_SITE_OTHER): Payer: Medicare Other | Admitting: Rheumatology

## 2017-05-17 VITALS — BP 162/73 | HR 56 | Resp 17 | Ht 69.0 in | Wt 215.0 lb

## 2017-05-17 DIAGNOSIS — R7612 Nonspecific reaction to cell mediated immunity measurement of gamma interferon antigen response without active tuberculosis: Secondary | ICD-10-CM

## 2017-05-17 DIAGNOSIS — M3219 Other organ or system involvement in systemic lupus erythematosus: Secondary | ICD-10-CM

## 2017-05-17 DIAGNOSIS — Z8669 Personal history of other diseases of the nervous system and sense organs: Secondary | ICD-10-CM | POA: Diagnosis not present

## 2017-05-17 DIAGNOSIS — R609 Edema, unspecified: Secondary | ICD-10-CM

## 2017-05-17 DIAGNOSIS — Z86711 Personal history of pulmonary embolism: Secondary | ICD-10-CM

## 2017-05-17 DIAGNOSIS — Z8719 Personal history of other diseases of the digestive system: Secondary | ICD-10-CM | POA: Diagnosis not present

## 2017-05-17 DIAGNOSIS — Z8709 Personal history of other diseases of the respiratory system: Secondary | ICD-10-CM

## 2017-05-17 DIAGNOSIS — Z79899 Other long term (current) drug therapy: Secondary | ICD-10-CM | POA: Diagnosis not present

## 2017-05-20 LAB — CBC WITH DIFFERENTIAL/PLATELET
Basophils Absolute: 49 cells/uL (ref 0–200)
Basophils Relative: 0.9 %
Eosinophils Absolute: 108 cells/uL (ref 15–500)
Eosinophils Relative: 2 %
HCT: 40.1 % (ref 38.5–50.0)
Hemoglobin: 13.8 g/dL (ref 13.2–17.1)
Lymphs Abs: 1215 cells/uL (ref 850–3900)
MCH: 30.7 pg (ref 27.0–33.0)
MCHC: 34.4 g/dL (ref 32.0–36.0)
MCV: 89.3 fL (ref 80.0–100.0)
MPV: 10.4 fL (ref 7.5–12.5)
Monocytes Relative: 10.3 %
Neutro Abs: 3472 cells/uL (ref 1500–7800)
Neutrophils Relative %: 64.3 %
Platelets: 202 10*3/uL (ref 140–400)
RBC: 4.49 10*6/uL (ref 4.20–5.80)
RDW: 11.9 % (ref 11.0–15.0)
Total Lymphocyte: 22.5 %
WBC mixed population: 556 cells/uL (ref 200–950)
WBC: 5.4 10*3/uL (ref 3.8–10.8)

## 2017-05-20 LAB — URINALYSIS, ROUTINE W REFLEX MICROSCOPIC
Bilirubin Urine: NEGATIVE
Glucose, UA: NEGATIVE
Hgb urine dipstick: NEGATIVE
Hyaline Cast: NONE SEEN /LPF
Ketones, ur: NEGATIVE
Leukocytes, UA: NEGATIVE
Nitrite: NEGATIVE
Specific Gravity, Urine: 1.026 (ref 1.001–1.03)
Squamous Epithelial / LPF: NONE SEEN /HPF (ref <=5)
pH: 5.5 (ref 5.0–8.0)

## 2017-05-20 LAB — C3 AND C4
C3 COMPLEMENT: 118 mg/dL (ref 82–185)
C4 COMPLEMENT: 17 mg/dL (ref 15–53)

## 2017-05-20 LAB — COMPLETE METABOLIC PANEL WITH GFR
AG Ratio: 2.1 (calc) (ref 1.0–2.5)
ALT: 13 U/L (ref 9–46)
AST: 18 U/L (ref 10–35)
Albumin: 4.2 g/dL (ref 3.6–5.1)
Alkaline phosphatase (APISO): 38 U/L — ABNORMAL LOW (ref 40–115)
BUN: 19 mg/dL (ref 7–25)
CO2: 26 mmol/L (ref 20–32)
Calcium: 9.4 mg/dL (ref 8.6–10.3)
Chloride: 109 mmol/L (ref 98–110)
Creat: 0.92 mg/dL (ref 0.70–1.11)
GFR, Est African American: 91 mL/min/{1.73_m2} (ref >=60)
GFR, Est Non African American: 78 mL/min/{1.73_m2} (ref >=60)
Globulin: 2 g/dL (calc) (ref 1.9–3.7)
Glucose, Bld: 129 mg/dL — ABNORMAL HIGH (ref 65–99)
Potassium: 4 mmol/L (ref 3.5–5.3)
Sodium: 143 mmol/L (ref 135–146)
Total Bilirubin: 0.7 mg/dL (ref 0.2–1.2)
Total Protein: 6.2 g/dL (ref 6.1–8.1)

## 2017-05-20 LAB — ANTI-DNA ANTIBODY, DOUBLE-STRANDED: ds DNA Ab: 2 IU/mL

## 2017-05-20 LAB — SEDIMENTATION RATE: Sed Rate: 2 mm/h (ref 0–20)

## 2017-05-20 LAB — ANTI-NUCLEAR AB-TITER (ANA TITER)

## 2017-05-20 LAB — ANA: ANA: POSITIVE — AB

## 2017-05-20 LAB — VITAMIN D 25 HYDROXY (VIT D DEFICIENCY, FRACTURES): VIT D 25 HYDROXY: 50 ng/mL (ref 30–100)

## 2017-05-21 NOTE — Progress Notes (Signed)
Glucose is elevated.   Autoimmune labs are stable.   UA reveals 2+ protein.  Please ask patient if he sees a nephrologist.  If he does not, please add protein creatinine ratio to lab work.

## 2017-05-22 DIAGNOSIS — E119 Type 2 diabetes mellitus without complications: Secondary | ICD-10-CM | POA: Diagnosis not present

## 2017-05-22 DIAGNOSIS — R600 Localized edema: Secondary | ICD-10-CM | POA: Diagnosis not present

## 2017-05-24 DIAGNOSIS — H0100B Unspecified blepharitis left eye, upper and lower eyelids: Secondary | ICD-10-CM | POA: Diagnosis not present

## 2017-05-24 DIAGNOSIS — B0052 Herpesviral keratitis: Secondary | ICD-10-CM | POA: Diagnosis not present

## 2017-05-24 DIAGNOSIS — H0100A Unspecified blepharitis right eye, upper and lower eyelids: Secondary | ICD-10-CM | POA: Diagnosis not present

## 2017-05-24 DIAGNOSIS — H1789 Other corneal scars and opacities: Secondary | ICD-10-CM | POA: Diagnosis not present

## 2017-05-28 ENCOUNTER — Other Ambulatory Visit: Payer: Self-pay | Admitting: Rheumatology

## 2017-05-28 ENCOUNTER — Telehealth: Payer: Self-pay | Admitting: *Deleted

## 2017-05-28 DIAGNOSIS — R808 Other proteinuria: Secondary | ICD-10-CM | POA: Diagnosis not present

## 2017-05-28 NOTE — Telephone Encounter (Signed)
-----   Message from Ofilia Neas, PA-C sent at 05/21/2017  4:02 PM EDT ----- Glucose is elevated.   Autoimmune labs are stable.   UA reveals 2+ protein.  Please ask patient if he sees a nephrologist.  If he does not, please add protein creatinine ratio to lab work.

## 2017-05-29 LAB — PROTEIN / CREATININE RATIO, URINE
CREATININE, URINE: 124 mg/dL (ref 20–320)
Protein/Creat Ratio: 347 mg/g creat — ABNORMAL HIGH (ref 22–128)
TOTAL PROTEIN, URINE: 43 mg/dL — AB (ref 5–25)

## 2017-05-29 NOTE — Progress Notes (Signed)
Please refer patient to nephrology for proteinuria

## 2017-06-05 ENCOUNTER — Telehealth: Payer: Self-pay | Admitting: *Deleted

## 2017-06-05 DIAGNOSIS — R808 Other proteinuria: Secondary | ICD-10-CM

## 2017-06-05 NOTE — Telephone Encounter (Signed)
-----   Message from Bo Merino, MD sent at 05/29/2017  1:09 PM EDT ----- Please refer patient to nephrology for proteinuria

## 2017-06-08 DIAGNOSIS — F4323 Adjustment disorder with mixed anxiety and depressed mood: Secondary | ICD-10-CM | POA: Diagnosis not present

## 2017-07-03 DIAGNOSIS — F4323 Adjustment disorder with mixed anxiety and depressed mood: Secondary | ICD-10-CM | POA: Diagnosis not present

## 2017-07-04 DIAGNOSIS — H25813 Combined forms of age-related cataract, bilateral: Secondary | ICD-10-CM | POA: Diagnosis not present

## 2017-07-04 DIAGNOSIS — B0052 Herpesviral keratitis: Secondary | ICD-10-CM | POA: Diagnosis not present

## 2017-07-04 DIAGNOSIS — H1789 Other corneal scars and opacities: Secondary | ICD-10-CM | POA: Diagnosis not present

## 2017-07-04 DIAGNOSIS — H0100A Unspecified blepharitis right eye, upper and lower eyelids: Secondary | ICD-10-CM | POA: Diagnosis not present

## 2017-08-01 DIAGNOSIS — K2270 Barrett's esophagus without dysplasia: Secondary | ICD-10-CM | POA: Diagnosis not present

## 2017-08-01 DIAGNOSIS — I2510 Atherosclerotic heart disease of native coronary artery without angina pectoris: Secondary | ICD-10-CM | POA: Diagnosis not present

## 2017-08-01 DIAGNOSIS — I10 Essential (primary) hypertension: Secondary | ICD-10-CM | POA: Diagnosis not present

## 2017-08-01 DIAGNOSIS — G4733 Obstructive sleep apnea (adult) (pediatric): Secondary | ICD-10-CM | POA: Diagnosis not present

## 2017-08-01 DIAGNOSIS — M159 Polyosteoarthritis, unspecified: Secondary | ICD-10-CM | POA: Diagnosis not present

## 2017-08-01 DIAGNOSIS — E785 Hyperlipidemia, unspecified: Secondary | ICD-10-CM | POA: Diagnosis not present

## 2017-08-01 DIAGNOSIS — M329 Systemic lupus erythematosus, unspecified: Secondary | ICD-10-CM | POA: Diagnosis not present

## 2017-08-01 DIAGNOSIS — E119 Type 2 diabetes mellitus without complications: Secondary | ICD-10-CM | POA: Diagnosis not present

## 2017-08-14 DIAGNOSIS — F4323 Adjustment disorder with mixed anxiety and depressed mood: Secondary | ICD-10-CM | POA: Diagnosis not present

## 2017-09-12 DIAGNOSIS — F4323 Adjustment disorder with mixed anxiety and depressed mood: Secondary | ICD-10-CM | POA: Diagnosis not present

## 2017-10-09 DIAGNOSIS — F4323 Adjustment disorder with mixed anxiety and depressed mood: Secondary | ICD-10-CM | POA: Diagnosis not present

## 2017-10-11 ENCOUNTER — Telehealth: Payer: Self-pay | Admitting: Rheumatology

## 2017-10-11 NOTE — Telephone Encounter (Signed)
Patient called stating he "thinks Dr. Estanislado Pandy was going to refer him to see a kidney doctor" but hasn't made an appointment yet.  Patient requested a return call.

## 2017-10-11 NOTE — Telephone Encounter (Signed)
Advised patient he was referred to [facility]. Patient verbalized understanding and will call them back to schedule an appointment.

## 2017-10-15 ENCOUNTER — Telehealth: Payer: Self-pay | Admitting: Pulmonary Disease

## 2017-10-15 DIAGNOSIS — G4733 Obstructive sleep apnea (adult) (pediatric): Secondary | ICD-10-CM

## 2017-10-15 DIAGNOSIS — Z9989 Dependence on other enabling machines and devices: Principal | ICD-10-CM

## 2017-10-15 NOTE — Telephone Encounter (Signed)
New prescription for CPAP 10 cm

## 2017-10-15 NOTE — Telephone Encounter (Signed)
Spoke with pt. He is aware of RA's response. Order has been placed. Nothing further was needed.

## 2017-10-15 NOTE — Telephone Encounter (Signed)
Pt requesting an order to [facility] for a new cpap mask and a new machine: resmed airsense 10 autoset.    RA ok to order?  Thanks!

## 2017-10-16 ENCOUNTER — Telehealth: Payer: Self-pay | Admitting: Pulmonary Disease

## 2017-10-16 NOTE — Telephone Encounter (Signed)
Spoke with [employer]. He stated that in order for the patient to receive a new CPAP machine he will need an OV stating his prior usage and benefits. The OV from January will not qualify since it has been more than 6 months.   He also stated that they will need a copy of patient's sleep study. [name] that per his chart, in 2015 he switched from a DME called [facility] to [facility].   Left message for patient to call back to get him scheduled.

## 2017-10-18 NOTE — Telephone Encounter (Signed)
Called patient, unable to reach left message to give us a call back. 

## 2017-10-22 NOTE — Telephone Encounter (Signed)
Spoke with patient. He is willing to come in this week but he is currently out of town and away from his calendar. He has requested that I call him back tomorrow afternoon (he stated he should be back home by then).   Will route this message to myself for follow up.

## 2017-10-23 ENCOUNTER — Encounter: Payer: Self-pay | Admitting: Adult Health

## 2017-10-23 ENCOUNTER — Ambulatory Visit (INDEPENDENT_AMBULATORY_CARE_PROVIDER_SITE_OTHER): Payer: Medicare Other | Admitting: Adult Health

## 2017-10-23 DIAGNOSIS — G4733 Obstructive sleep apnea (adult) (pediatric): Secondary | ICD-10-CM | POA: Diagnosis not present

## 2017-10-23 DIAGNOSIS — Z9989 Dependence on other enabling machines and devices: Secondary | ICD-10-CM | POA: Diagnosis not present

## 2017-10-23 NOTE — Progress Notes (Signed)
@Patient  ID: Tyler Norris, male    DOB: [birth year], 83 y.o.   MRN: [id number]  Chief Complaint  Patient presents with  . Follow-up    CPAP - see phone note dated 8/19 and 8/20    Referring provider: [name], MD  HPI: 83 yo never smoker with SLE diagnosed 2050for FU of OSA .  Was on INH for Quantiferon test -positive , finished course from 12/2012 . Followed by Estanislado Pandy for SLE, on Plaquenil . Off prednisone. Pleural effusions resolved. H/o pulmonary embolism 05/2012 - finished coumadin course 02/2013 .   Former Insurance underwriter .   Significant tests/ events  Thoracentesis 04/2012 of 230 mL of monocytic exudative fluid.   CT angiogram on 06/05/12 showed small subsegmental pulmonary emboli in the right upper lobe, right middle lobe and left lower lobe. A right effusion was noted somewhat loculated and a small left effusion and was layering.    Adm 07/2014 -Pericardial effusion with near tamponade, picked up on CT angio , resolved on FU 10/2014 - on NSAIDs   PFTs - 04/2014 - unchanged, FEV1 79%, FVC 78% improves to >80% with BDs, TLC 68%, DLCO 55%  4/2017He developed left pleural effusion and underwent thoracentesis -about 60 mL >> monocytic exudate as before>> plaquanil added  10/23/2017 Follow up : OSA  Patient presents for a six-month follow-up.  Patient has underlying sleep apnea.  Patient says he needs a new CPAP machine.  His is gotten older and is not working well. He says he wears it every night for at least 6 hours.  He feels rested with no daytime sleepiness. CPAP download was requested. Patient says he is very active.  Allergies  Allergen Reactions  . Atorvastatin Other (See Comments)    Myalgias  . Lipitor [Atorvastatin Calcium]     Myalgias    Immunization History  Administered Date(s) Administered  . Influenza Nasal 11/28/2011  . Influenza Split 11/27/2013, 11/28/2014  . Influenza,inj,Quad PF,6+ Mos 11/11/2012, 10/29/2015  .  Influenza-Unspecified 11/16/2014  . Pneumococcal Polysaccharide-23 05/15/2012  . Tdap 01/07/2017    Past Medical History:  Diagnosis Date  . AAA (abdominal aortic aneurysm) ([place])    a. resolved by ct scan 09-2011  . Anemia   . Arthritis   . Barrett's esophagus   . Carpal tunnel syndrome, bilateral   . Essential hypertension   . Fibromyalgia   . GERD (gastroesophageal reflux disease)   . History of pneumonia   . Hyperlipidemia   . Lupus (systemic lupus erythematosus) ([place])   . Nephrolithiasis   . Pericardial effusion    a. 08/22/2014 Echo: EF 60-65%, mildly dil Ao root (56mm), mildly dil LA, mod-large partially organized pericardial effusion with possible early tamponade - asymptomatic on high dose nsaids-->conservative mgmt;  b. 08/2014 Limited Echo: EF 55-60%, Triv - small effusion.  . Sleep apnea    a. uses cpap every night    Tobacco History: Social History   Tobacco Use  Smoking Status Never Smoker  Smokeless Tobacco Never Used   Counseling given: Not Answered   Outpatient Medications Prior to Visit  Medication Sig Dispense Refill  . acetaminophen (TYLENOL) 500 MG tablet Take 2 tablets (1,000 mg total) every 8 (eight) hours by mouth. 30 tablet 0  . acyclovir (ZOVIRAX) 400 MG tablet 2 (two) times daily.  0  . amLODipine (NORVASC) 5 MG tablet Take 1 tablet (5 mg total) daily by mouth.    Marland Kitchen aspirin 81 MG tablet Take 81 mg by mouth daily.    Marland Kitchen  Bioflavonoid Products (BIOFLEX PO) Take 2 tablets by mouth daily.     Marland Kitchen CALCIUM PO Take 1 tablet daily by mouth.    . Cholecalciferol (VITAMIN D3) 5000 UNITS CAPS Take 5,000 Units by mouth daily.    Marland Kitchen docusate sodium (COLACE) 100 MG capsule Take 1 capsule (100 mg total) 2 (two) times daily by mouth. 10 capsule 0  . esomeprazole (NEXIUM) 40 MG capsule Take 40 mg by mouth daily at 12 noon.    . fenofibrate (TRICOR) 145 MG tablet Take 145 mg by mouth daily.    . ferrous sulfate 325 (65 FE) MG tablet Take 325 mg by mouth daily with  breakfast.    . folic acid (FOLVITE) 606 MCG tablet Take 400 mcg by mouth daily.    . hydroxychloroquine (PLAQUENIL) 200 MG tablet Take 200 mg by mouth 2 (two) times daily.    . hydroxypropyl methylcellulose / hypromellose (ISOPTO TEARS / GONIOVISC) 2.5 % ophthalmic solution Place 1 drop as needed into both eyes for dry eyes.    . magnesium oxide (MAG-OX) 400 (241.3 MG) MG tablet Take 1 tablet (400 mg total) by mouth 3 (three) times daily. 60 tablet 0  . metoprolol tartrate (LOPRESSOR) 25 MG tablet Take 0.5 tablets (12.5 mg total) 2 (two) times daily by mouth.    . Multiple Vitamin (MULTIVITAMIN WITH MINERALS) TABS Take 1 tablet by mouth daily.    . Omega-3 Fatty Acids (FISH OIL) 1200 MG CAPS Take 1,200 mg by mouth daily.     . rosuvastatin (CRESTOR) 5 MG tablet Take 2.5 mg by mouth daily.     . traMADol (ULTRAM) 50 MG tablet Take 1 tablet (50 mg total) every 12 (twelve) hours by mouth. 20 tablet 0  . vitamin C (ASCORBIC ACID) 500 MG tablet Take 1,000 mg by mouth daily.     . vitamin E 400 UNIT capsule Take 400 Units by mouth daily.     No facility-administered medications prior to visit.      Review of Systems  Constitutional:   No  weight loss, night sweats,  Fevers, chills, fatigue, or  lassitude.  HEENT:   No headaches,  Difficulty swallowing,  Tooth/dental problems, or  Sore throat,                No sneezing, itching, ear ache, nasal congestion, post nasal drip,   CV:  No chest pain,  Orthopnea, PND, swelling in lower extremities, anasarca, dizziness, palpitations, syncope.   GI  No heartburn, indigestion, abdominal pain, nausea, vomiting, diarrhea, change in bowel habits, loss of appetite, bloody stools.   Resp: No shortness of breath with exertion or at rest.  No excess mucus, no productive cough,  No non-productive cough,  No coughing up of blood.  No change in color of mucus.  No wheezing.  No chest wall deformity  Skin: no rash or lesions.  GU: no dysuria, change in color  of urine, no urgency or frequency.  No flank pain, no hematuria   MS:  No joint pain or swelling.  No decreased range of motion.  No back pain.    Physical Exam  BP 134/80 (BP Location: Left Arm, Patient Position: Sitting, Cuff Size: Normal)   Pulse (!) 53   Ht 5\' 9"  (1.753 m)   Wt 211 lb (95.7 kg)   SpO2 96%   BMI 31.16 kg/m   GEN: A/Ox3; pleasant , NAD, elderly    HEENT:  [place]/AT,  EACs-clear, TMs-wnl, NOSE-clear, THROAT-clear, no lesions,  no postnasal drip or exudate noted.   NECK:  Supple w/ fair ROM; no JVD; normal carotid impulses w/o bruits; no thyromegaly or nodules palpated; no lymphadenopathy.    RESP  Clear  P & A; w/o, wheezes/ rales/ or rhonchi. no accessory muscle use, no dullness to percussion  CARD:  RRR, no m/r/g, no peripheral edema, pulses intact, no cyanosis or clubbing.  GI:   Soft & nt; nml bowel sounds; no organomegaly or masses detected.   Musco: Warm bil, no deformities or joint swelling noted.   Neuro: alert, no focal deficits noted.    Skin: Warm, no lesions or rashes    Lab Results:  CBC    Component Value Date/Time   WBC 5.4 05/17/2017 1005   RBC 4.49 05/17/2017 1005   HGB 13.8 05/17/2017 1005   HCT 40.1 05/17/2017 1005   PLT 202 05/17/2017 1005   MCV 89.3 05/17/2017 1005   MCH 30.7 05/17/2017 1005   MCHC 34.4 05/17/2017 1005   RDW 11.9 05/17/2017 1005   LYMPHSABS 1,215 05/17/2017 1005   MONOABS 1.0 05/26/2015 1644   EOSABS 108 05/17/2017 1005   BASOSABS 49 05/17/2017 1005    BMET    Component Value Date/Time   NA 143 05/17/2017 1005   K 4.0 05/17/2017 1005   CL 109 05/17/2017 1005   CO2 26 05/17/2017 1005   GLUCOSE 129 (H) 05/17/2017 1005   BUN 19 05/17/2017 1005   CREATININE 0.92 05/17/2017 1005   CALCIUM 9.4 05/17/2017 1005   GFRNONAA 78 05/17/2017 1005   GFRAA 91 05/17/2017 1005    BNP    Component Value Date/Time   BNP 69.4 08/18/2014 1109    ProBNP    Component Value Date/Time   PROBNP 62.0 05/26/2015  1644    Imaging: No results found.   Assessment & Plan:   No problem-specific Assessment & Plan notes found for this encounter.     Rexene Edison, NP 10/23/2017

## 2017-10-23 NOTE — Telephone Encounter (Signed)
Called and spoke with pt and scheduled pt an OV this afternoon with TP at 2:15 so he can be able to get everything worked out with his CPAP.  Nothing further needed.

## 2017-10-23 NOTE — Telephone Encounter (Signed)
Pt is calling back [phone] or [phone]

## 2017-10-23 NOTE — Patient Instructions (Addendum)
Continue on CPAP At bedtime  .  Order for new CPAP machine .  Keep up good work  .  Follow up Dr. Elsworth Soho  In  6 months  and As needed

## 2017-10-23 NOTE — Assessment & Plan Note (Signed)
Controlled on CPAP  New machine order   Plan  Patient Instructions  Continue on CPAP At bedtime  .  Order for new CPAP machine .  Keep up good work  .  Follow up Dr. Elsworth Soho  In  6 months  and As needed

## 2017-10-24 NOTE — Addendum Note (Signed)
Addended by: Parke Poisson E on: 10/24/2017 10:57 AM   Modules accepted: Orders

## 2017-10-25 ENCOUNTER — Encounter: Payer: Self-pay | Admitting: Rheumatology

## 2017-10-25 DIAGNOSIS — I129 Hypertensive chronic kidney disease with stage 1 through stage 4 chronic kidney disease, or unspecified chronic kidney disease: Secondary | ICD-10-CM | POA: Diagnosis not present

## 2017-10-25 DIAGNOSIS — M329 Systemic lupus erythematosus, unspecified: Secondary | ICD-10-CM | POA: Diagnosis not present

## 2017-10-25 DIAGNOSIS — R809 Proteinuria, unspecified: Secondary | ICD-10-CM | POA: Diagnosis not present

## 2017-10-25 DIAGNOSIS — I714 Abdominal aortic aneurysm, without rupture: Secondary | ICD-10-CM | POA: Diagnosis not present

## 2017-10-25 DIAGNOSIS — E785 Hyperlipidemia, unspecified: Secondary | ICD-10-CM | POA: Diagnosis not present

## 2017-10-25 DIAGNOSIS — K219 Gastro-esophageal reflux disease without esophagitis: Secondary | ICD-10-CM | POA: Diagnosis not present

## 2017-10-25 DIAGNOSIS — N182 Chronic kidney disease, stage 2 (mild): Secondary | ICD-10-CM | POA: Diagnosis not present

## 2017-11-05 NOTE — Progress Notes (Signed)
Office Visit Note  Patient: Tyler Norris             Date of Birth: [birth year]           MRN: [id number]             PCP: [name], MD Referring: [name], MD Visit Date: 11/19/2017 Occupation: _0 @  Subjective:  Medication management.   History of Present Illness: LINSEY HIROTA is a 83 y.o. male with history of systemic lupus erythematosus.  He states he has been doing well and has been quite active.  He denies any joint pain or joint swelling.  He has not had any recent rash.  He denies any shortness of breath.  On his most recent labs he had some proteinuria and was referred to nephrology.  He had his ultrasound of bilateral kidneys but has not heard back from nephrology yet.  Activities of Daily Living:  Patient reports morning stiffness for 0 minutes.   Patient Denies nocturnal pain.  Difficulty dressing/grooming: Denies Difficulty climbing stairs: Reports Difficulty getting out of chair: Reports Difficulty using hands for taps, buttons, cutlery, and/or writing: Denies  Review of Systems  Constitutional: Positive for fatigue.  HENT: Positive for mouth dryness. Negative for mouth sores, trouble swallowing and trouble swallowing.   Eyes: Positive for dryness. Negative for pain.  Respiratory: Negative for shortness of breath and difficulty breathing.   Cardiovascular: Positive for swelling in legs/feet. Negative for chest pain.  Gastrointestinal: Negative for abdominal pain, constipation, diarrhea, nausea and vomiting.  Endocrine: Negative for increased urination.  Genitourinary: Negative for pelvic pain and urgency.  Musculoskeletal: Positive for arthralgias and joint pain. Negative for joint swelling and morning stiffness.  Skin: Negative for rash and hair loss.  Allergic/Immunologic: Negative for susceptible to infections.  Neurological: Positive for memory loss. Negative for dizziness, light-headedness, headaches and weakness.  Hematological:  Negative for bruising/bleeding tendency.  Psychiatric/Behavioral: Negative for confusion. The patient is not nervous/anxious.     PMFS History:  Patient Active Problem List   Diagnosis Date Noted  . SDH (subdural hematoma) ([place]) 01/08/2017  . Pleural effusion on left 06/02/2015  . Aneurysm of thoracic aorta ([place]) 06/02/2015  . SOB (shortness of breath) 08/22/2014  . Pericardial effusion 08/22/2014  . Lupus (systemic lupus erythematosus) ([place])   . 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 PE (pulmonary embolism) 06/05/2012  . Pleural effusion exudative 05/26/2012  . Essential hypertension 05/13/2012  . OSA on CPAP 05/13/2012  . Cardiomegaly 05/13/2012  . Abdominal aneurysm without mention of rupture 06/27/2011    Past Medical History:  Diagnosis Date  . AAA (abdominal aortic aneurysm) ([place])    a. resolved by ct scan 09-2011  . Anemia   . Arthritis   . Barrett's esophagus   . Carpal tunnel syndrome, bilateral   . Essential hypertension   . Fibromyalgia   . GERD (gastroesophageal reflux disease)   . History of pneumonia   . Hyperlipidemia   . Lupus (systemic lupus erythematosus) ([place])   . Nephrolithiasis   . Pericardial effusion    a. 08/22/2014 Echo: EF 60-65%, mildly dil Ao root (5m), mildly dil LA, mod-large partially organized pericardial effusion with possible early tamponade - asymptomatic on high dose nsaids-->conservative mgmt;  b. 08/2014 Limited Echo: EF 55-60%, Triv - small effusion.  . Sleep apnea    a. uses cpap every night    Family History  Problem Relation Age of Onset  . Diabetes Mother   . Diabetes  Sister   . Stroke Sister   . Healthy Daughter   . Healthy Son    Past Surgical History:  Procedure Laterality Date  . APPENDECTOMY    . CARPAL TUNNEL RELEASE  10/12/2011   Procedure: CARPAL TUNNEL RELEASE;  Surgeon: Cammie Sickle., MD;  Location: [facility];  Service: Orthopedics;  Laterality: Left;  . CARPAL TUNNEL RELEASE   12/01/2011   Procedure: CARPAL TUNNEL RELEASE;  Surgeon: Cammie Sickle., MD;  Location: [facility];  Service: Orthopedics;  Laterality: Right;  . CYSTOSCOPY W/ STONE MANIPULATION  [id number]  . HAND SURGERY     scar repaired lt hand  . HERNIA REPAIR  1966  . KNEE ARTHROPLASTY    . KNEE ARTHROSCOPY Left   . TONSILLECTOMY     Social History   Social History Narrative  . Not on file    Objective: Vital Signs: BP (!) 156/72 (BP Location: Left Arm, Patient Position: Sitting, Cuff Size: Normal)   Pulse (!) 50   Resp 14   Ht _0  (1.753 m)   Wt 212 lb 3.2 oz (96.3 kg)   BMI 31.34 kg/m    Physical Exam  Constitutional: He is oriented to person, place, and time. He appears well-developed and well-nourished.  HENT:  Head: Normocephalic and atraumatic.  Eyes: Pupils are equal, round, and reactive to light. Conjunctivae and EOM are normal.  Neck: Normal range of motion. Neck supple.  Cardiovascular: Normal rate, regular rhythm and normal heart sounds.  Pulmonary/Chest: Effort normal and breath sounds normal.  Abdominal: Soft. Bowel sounds are normal.  Musculoskeletal: He exhibits edema.  Neurological: He is alert and oriented to person, place, and time.  Skin: Skin is warm and dry. Capillary refill takes less than 2 seconds.  Psychiatric: He has a normal mood and affect. His behavior is normal.  Nursing note and vitals reviewed.    Musculoskeletal Exam: Patient has limited range of motion of his cervical spine.  Shoulder joints elbow joints wrist joints with good range of motion.  He has some thickening of PIP DIP joints in his hands.  Hip joints and knee joints were in good range of motion.  No warmth swelling or effusion was noted.  He has bilateral pitting edema.  CDAI Exam: CDAI Score: Not documented Patient Global Assessment: Not documented; Provider Global Assessment: Not documented Swollen: Not documented; Tender: Not documented Joint Exam   Not  documented   There is currently no information documented on the homunculus. Go to the Rheumatology activity and complete the homunculus joint exam.  Investigation: Findings:  I reviewed records from October 25, 2017 from [facility].  Patient was evaluated for proteinuria.  The labs obtained on October 25, 2017 UA showed 3+ protein.  CMP was unremarkable with normal GFR.  SPEP was normal.  ANA was positive, ANCA was negative, ASO titer was negative, C3-C4 were normal, dsDNA was negative.  Ultrasound of both kidneys were obtained.  He was advised to hold ACE inhibitors and NSAIDs.   Imaging: US Renal  Result Date: 11/09/2017 CLINICAL DATA:  Microhematuria. EXAM: RENAL / URINARY TRACT ULTRASOUND COMPLETE COMPARISON:  Abdominal ultrasound 07/29/2015 FINDINGS: Right Kidney: Length: 12.6 cm. Increased cortical echogenicity. No hydronephrosis visualized. 1.0 x 0.8 x 0.9 cm complex cystic mass in the superior pole of the right kidney. Left Kidney: Length: 13.7 cm. Increased cortical echogenicity. 3.5 x 4.0 x 2.7 cm simple appearing cyst in the interpolar region of the left  renal pelvis, stable in size from prior ultrasound. Additionally 1.1 x 0.9 x 1.1 cm simple appearing cyst in the midpole region of the left kidney, stable in size from prior ultrasound. Bladder: Appears normal for degree of bladder distention. IMPRESSION: Increased echogenicity of the renal cortices, consistent with medical renal disease. Stable benign-appearing left renal cyst. 1 cm hypoechoic right upper pole renal nodule may represent mildly complicated cyst or a complex cystic mass. Attention on follow-up is recommended. Electronically Signed   By: Fidela Salisbury M.D.   On: 11/09/2017 17:57    Recent Labs: Lab Results  Component Value Date   WBC 5.4 05/17/2017   HGB 13.8 05/17/2017   PLT 202 05/17/2017   NA 143 05/17/2017   K 4.0 05/17/2017   CL 109 05/17/2017   CO2 26 05/17/2017   GLUCOSE 129 (H)  05/17/2017   BUN 19 05/17/2017   CREATININE 0.92 05/17/2017   BILITOT 0.7 05/17/2017   ALKPHOS 43 08/22/2014   AST 18 05/17/2017   ALT 13 05/17/2017   PROT 6.2 05/17/2017   ALBUMIN 3.3 (L) 08/22/2014   CALCIUM 9.4 05/17/2017   GFRAA 91 05/17/2017  May 17, 2017 C3-C4 normal, dsDNA negative, ESR 2  Speciality Comments: PLQ Eye Exam: 08/03/16 WNL @ [facility]. Follow up in 1 year.  Procedures:  No procedures performed Allergies: Atorvastatin and Lipitor [atorvastatin calcium]   Assessment / Plan:     Visit Diagnoses: Other systemic lupus erythematosus with other organ involvement ([place]) - +ANA, +RF.  Patient is clinically doing well.  His double-stranded DNA and complements were normal in March.  He had some proteinuria for which she was referred to nephrology.  He had ultrasound of his kidneys which shows a stable cystic lesion.  Proteinuria-patient was evaluated by nephrology.  He has a follow-up appointment.  He was advised to control his blood pressure and blood sugars tightly.  He was also advised to avoid anti-inflammatories.  High risk medication use - PLQ. eye exam: 08/03/2016 - Plan: COMPLETE METABOLIC PANEL WITH GFR, CBC with Differential/Platelet, CBC with Differential/Platelet, COMPLETE METABOLIC PANEL WITH GFR  Chronic pain of both knees-I offered x-rays which she declined.  We joint exercises were discussed.  He is very active.  History of pleural effusion-in the past.  He has no recurrence of it.  Neck  Stiffness-he has limited lateral rotation.  He declined x-ray.  Neck  exercises handout was given.  Positive QuantiFERON-TB Gold test  History of sleep apnea  History of pulmonary embolism  History of gastroesophageal reflux (GERD)   Orders: Orders Placed This Encounter  Procedures  . CBC with Differential/Platelet  . COMPLETE METABOLIC PANEL WITH GFR   No orders of the defined types were placed in this encounter.   Face-to-face time spent with  patient was 30 minutes. Greater than 50% of time was spent in counseling and coordination of care.  Follow-Up Instructions: Return in about 5 months (around 04/21/2018) for Systemic lupus.   Bo Merino, MD  Note - This record has been created using Editor, commissioning.  Chart creation errors have been sought, but may not always  have been located. Such creation errors do not reflect on  the standard of medical care.

## 2017-11-07 ENCOUNTER — Other Ambulatory Visit: Payer: Self-pay | Admitting: Nephrology

## 2017-11-07 DIAGNOSIS — F4323 Adjustment disorder with mixed anxiety and depressed mood: Secondary | ICD-10-CM | POA: Diagnosis not present

## 2017-11-07 DIAGNOSIS — R809 Proteinuria, unspecified: Secondary | ICD-10-CM

## 2017-11-07 DIAGNOSIS — N182 Chronic kidney disease, stage 2 (mild): Secondary | ICD-10-CM

## 2017-11-09 ENCOUNTER — Ambulatory Visit
Admission: RE | Admit: 2017-11-09 | Discharge: 2017-11-09 | Disposition: A | Discharge status: Home / Self Care | Payer: Medicare Other | Source: Ambulatory Visit | Attending: Nephrology | Admitting: Nephrology

## 2017-11-09 DIAGNOSIS — N281 Cyst of kidney, acquired: Secondary | ICD-10-CM | POA: Diagnosis not present

## 2017-11-09 DIAGNOSIS — N182 Chronic kidney disease, stage 2 (mild): Secondary | ICD-10-CM

## 2017-11-09 DIAGNOSIS — R809 Proteinuria, unspecified: Secondary | ICD-10-CM

## 2017-11-19 ENCOUNTER — Encounter: Payer: Self-pay | Admitting: Rheumatology

## 2017-11-19 ENCOUNTER — Ambulatory Visit (INDEPENDENT_AMBULATORY_CARE_PROVIDER_SITE_OTHER): Payer: Medicare Other | Admitting: Rheumatology

## 2017-11-19 VITALS — BP 156/72 | HR 50 | Resp 14 | Ht 69.0 in | Wt 212.2 lb

## 2017-11-19 DIAGNOSIS — M436 Torticollis: Secondary | ICD-10-CM

## 2017-11-19 DIAGNOSIS — Z8719 Personal history of other diseases of the digestive system: Secondary | ICD-10-CM | POA: Diagnosis not present

## 2017-11-19 DIAGNOSIS — Z86711 Personal history of pulmonary embolism: Secondary | ICD-10-CM

## 2017-11-19 DIAGNOSIS — M25562 Pain in left knee: Secondary | ICD-10-CM

## 2017-11-19 DIAGNOSIS — Z8709 Personal history of other diseases of the respiratory system: Secondary | ICD-10-CM | POA: Diagnosis not present

## 2017-11-19 DIAGNOSIS — M3219 Other organ or system involvement in systemic lupus erythematosus: Secondary | ICD-10-CM

## 2017-11-19 DIAGNOSIS — G8929 Other chronic pain: Secondary | ICD-10-CM

## 2017-11-19 DIAGNOSIS — R7612 Nonspecific reaction to cell mediated immunity measurement of gamma interferon antigen response without active tuberculosis: Secondary | ICD-10-CM

## 2017-11-19 DIAGNOSIS — Z79899 Other long term (current) drug therapy: Secondary | ICD-10-CM | POA: Diagnosis not present

## 2017-11-19 DIAGNOSIS — Z8669 Personal history of other diseases of the nervous system and sense organs: Secondary | ICD-10-CM | POA: Diagnosis not present

## 2017-11-19 DIAGNOSIS — M25561 Pain in right knee: Secondary | ICD-10-CM

## 2017-11-19 NOTE — Patient Instructions (Addendum)
*Your next Plaquenil eye exam is overdue from June.  Please have your ophthalmologist fax [place] the results of your exam to [phone]*  Standing Labs We placed an order today for your standing lab work.    Please come back and get your standing labs in 5 months.  We have open lab Monday through Friday from 8:30-11:30 AM and 1:30-4:00 PM  at the office of Dr. Bo Merino.   You may experience shorter wait times on Monday and Friday afternoons. The office is located at [address], [place], [place], [place] [address] No appointment is necessary.   Labs are drawn by [facility].  You may receive a bill from [facility] for your lab work. If you have any questions regarding directions or hours of operation,  please call [phone].    Knee Exercises Ask your health care provider which exercises are safe for you. Do exercises exactly as told by your health care provider and adjust them as directed. It is normal to feel mild stretching, pulling, tightness, or discomfort as you do these exercises, but you should stop right away if you feel sudden pain or your pain gets worse.Do not begin these exercises until told by your health care provider. STRETCHING AND RANGE OF MOTION EXERCISES These exercises warm up your muscles and joints and improve the movement and flexibility of your knee. These exercises also help to relieve pain, numbness, and tingling. Exercise A: Knee Extension, Prone 1. Lie on your abdomen on a bed. 2. Place your left / right knee just beyond the edge of the surface so your knee is not on the bed. You can put a towel under your left / right thigh just above your knee for comfort. 3. Relax your leg muscles and allow gravity to straighten your knee. You should feel a stretch behind your left / right knee. 4. Hold this position for __________ seconds. 5. Scoot up so your knee is supported between repetitions. Repeat __________ times. Complete this stretch __________ times a  day. Exercise B: Knee Flexion, Active  1. Lie on your back with both knees straight. If this causes back discomfort, bend your left / right knee so your foot is flat on the floor. 2. Slowly slide your left / right heel back toward your buttocks until you feel a gentle stretch in the front of your knee or thigh. 3. Hold this position for __________ seconds. 4. Slowly slide your left / right heel back to the starting position. Repeat __________ times. Complete this exercise __________ times a day. Exercise C: Quadriceps, Prone  1. Lie on your abdomen on a firm surface, such as a bed or padded floor. 2. Bend your left / right knee and hold your ankle. If you cannot reach your ankle or pant leg, loop a belt around your foot and grab the belt instead. 3. Gently pull your heel toward your buttocks. Your knee should not slide out to the side. You should feel a stretch in the front of your thigh and knee. 4. Hold this position for __________ seconds. Repeat __________ times. Complete this stretch __________ times a day. Exercise D: Hamstring, Supine 1. Lie on your back. 2. Loop a belt or towel over the ball of your left / right foot. The ball of your foot is on the walking surface, right under your toes. 3. Straighten your left / right knee and slowly pull on the belt to raise your leg until you feel a gentle stretch behind your knee. ?  Do not let your left / right knee bend while you do this. ? Keep your other leg flat on the floor. 4. Hold this position for __________ seconds. Repeat __________ times. Complete this stretch __________ times a day. STRENGTHENING EXERCISES These exercises build strength and endurance in your knee. Endurance is the ability to use your muscles for a long time, even after they get tired. Exercise E: Quadriceps, Isometric  1. Lie on your back with your left / right leg extended and your other knee bent. Put a rolled towel or small pillow under your knee if told by your  health care provider. 2. Slowly tense the muscles in the front of your left / right thigh. You should see your kneecap slide up toward your hip or see increased dimpling just above the knee. This motion will push the back of the knee toward the floor. 3. For __________ seconds, keep the muscle as tight as you can without increasing your pain. 4. Relax the muscles slowly and completely. Repeat __________ times. Complete this exercise __________ times a day. Exercise F: Straight Leg Raises - Quadriceps 1. Lie on your back with your left / right leg extended and your other knee bent. 2. Tense the muscles in the front of your left / right thigh. You should see your kneecap slide up or see increased dimpling just above the knee. Your thigh may even shake a bit. 3. Keep these muscles tight as you raise your leg 4-6 inches (10-15 cm) off the floor. Do not let your knee bend. 4. Hold this position for __________ seconds. 5. Keep these muscles tense as you lower your leg. 6. Relax your muscles slowly and completely after each repetition. Repeat __________ times. Complete this exercise __________ times a day. Exercise G: Hamstring, Isometric 1. Lie on your back on a firm surface. 2. Bend your left / right knee approximately __________ degrees. 3. Dig your left / right heel into the surface as if you are trying to pull it toward your buttocks. Tighten the muscles in the back of your thighs to dig as hard as you can without increasing any pain. 4. Hold this position for __________ seconds. 5. Release the tension gradually and allow your muscles to relax completely for __________ seconds after each repetition. Repeat __________ times. Complete this exercise __________ times a day. Exercise H: Hamstring Curls  If told by your health care provider, do this exercise while wearing ankle weights. Begin with __________ weights. Then increase the weight by 1 lb (0.5 kg) increments. Do not wear ankle weights that  are more than __________. 1. Lie on your abdomen with your legs straight. 2. Bend your left / right knee as far as you can without feeling pain. Keep your hips flat against the floor. 3. Hold this position for __________ seconds. 4. Slowly lower your leg to the starting position.  Repeat __________ times. Complete this exercise __________ times a day. Exercise I: Squats (Quadriceps) 1. Stand in front of a table, with your feet and knees pointing straight ahead. You may rest your hands on the table for balance but not for support. 2. Slowly bend your knees and lower your hips like you are going to sit in a chair. ? Keep your weight over your heels, not over your toes. ? Keep your lower legs upright so they are parallel with the table legs. ? Do not let your hips go lower than your knees. ? Do not bend lower than told by your  health care provider. ? If your knee pain increases, do not bend as low. 3. Hold the squat position for __________ seconds. 4. Slowly push with your legs to return to standing. Do not use your hands to pull yourself to standing. Repeat __________ times. Complete this exercise __________ times a day. Exercise J: Wall Slides (Quadriceps)  1. Lean your back against a smooth wall or door while you walk your feet out 18-24 inches (46-61 cm) from it. 2. Place your feet hip-width apart. 3. Slowly slide down the wall or door until your knees bend __________ degrees. Keep your knees over your heels, not over your toes. Keep your knees in line with your hips. 4. Hold for __________ seconds. Repeat __________ times. Complete this exercise __________ times a day. Exercise K: Straight Leg Raises - Hip Abductors 1. Lie on your side with your left / right leg in the top position. Lie so your head, shoulder, knee, and hip line up. You may bend your bottom knee to help you keep your balance. 2. Roll your hips slightly forward so your hips are stacked directly over each other and your  left / right knee is facing forward. 3. Leading with your heel, lift your top leg 4-6 inches (10-15 cm). You should feel the muscles in your outer hip lifting. ? Do not let your foot drift forward. ? Do not let your knee roll toward the ceiling. 4. Hold this position for __________ seconds. 5. Slowly return your leg to the starting position. 6. Let your muscles relax completely after each repetition. Repeat __________ times. Complete this exercise __________ times a day. Exercise L: Straight Leg Raises - Hip Extensors 1. Lie on your abdomen on a firm surface. You can put a pillow under your hips if that is more comfortable. 2. Tense the muscles in your buttocks and lift your left / right leg about 4-6 inches (10-15 cm). Keep your knee straight as you lift your leg. 3. Hold this position for __________ seconds. 4. Slowly lower your leg to the starting position. 5. Let your leg relax completely after each repetition. Repeat __________ times. Complete this exercise __________ times a day. This information is not intended to replace advice given to you by your health care provider. Make sure you discuss any questions you have with your health care provider. Document Released: 12/28/2004 Document Revised: 11/08/2015 Document Reviewed: 12/20/2014 Elsevier Interactive Patient Education  2018 [place].  Cervical Strain and Sprain Rehab Ask your health care provider which exercises are safe for you. Do exercises exactly as told by your health care provider and adjust them as directed. It is normal to feel mild stretching, pulling, tightness, or discomfort as you do these exercises, but you should stop right away if you feel sudden pain or your pain gets worse.Do not begin these exercises until told by your health care provider. Stretching and range of motion exercises These exercises warm up your muscles and joints and improve the movement and flexibility of your neck. These exercises also help to  relieve pain, numbness, and tingling. Exercise A: Cervical side bend  1. Using good posture, sit on a stable chair or stand up. 2. Without moving your shoulders, slowly tilt your left / right ear to your shoulder until you feel a stretch in your neck muscles. You should be looking straight ahead. 3. Hold for __________ seconds. 4. Repeat with the other side of your neck. Repeat __________ times. Complete this exercise __________ times a day. Exercise  B: Cervical rotation  1. Using good posture, sit on a stable chair or stand up. 2. Slowly turn your head to the side as if you are looking over your left / right shoulder. ? Keep your eyes level with the ground. ? Stop when you feel a stretch along the side and the back of your neck. 3. Hold for __________ seconds. 4. Repeat this by turning to your other side. Repeat __________ times. Complete this exercise __________ times a day. Exercise C: Thoracic extension and pectoral stretch 1. Roll a towel or a small blanket so it is about 4 inches (10 cm) in diameter. 2. Lie down on your back on a firm surface. 3. Put the towel lengthwise, under your spine in the middle of your back. It should not be not under your shoulder blades. The towel should line up with your spine from your middle back to your lower back. 4. Put your hands behind your head and let your elbows fall out to your sides. 5. Hold for __________ seconds. Repeat __________ times. Complete this exercise __________ times a day. Strengthening exercises These exercises build strength and endurance in your neck. Endurance is the ability to use your muscles for a long time, even after your muscles get tired. Exercise D: Upper cervical flexion, isometric 1. Lie on your back with a thin pillow behind your head and a small rolled-up towel under your neck. 2. Gently tuck your chin toward your chest and nod your head down to look toward your feet. Do not lift your head off the pillow. 3. Hold  for __________ seconds. 4. Release the tension slowly. Relax your neck muscles completely before you repeat this exercise. Repeat __________ times. Complete this exercise __________ times a day. Exercise E: Cervical extension, isometric  1. Stand about 6 inches (15 cm) away from a wall, with your back facing the wall. 2. Place a soft object, about 6-8 inches (15-20 cm) in diameter, between the back of your head and the wall. A soft object could be a small pillow, a ball, or a folded towel. 3. Gently tilt your head back and press into the soft object. Keep your jaw and forehead relaxed. 4. Hold for __________ seconds. 5. Release the tension slowly. Relax your neck muscles completely before you repeat this exercise. Repeat __________ times. Complete this exercise __________ times a day. Posture and body mechanics  Body mechanics refers to the movements and positions of your body while you do your daily activities. Posture is part of body mechanics. Good posture and healthy body mechanics can help to relieve stress in your body's tissues and joints. Good posture means that your spine is in its natural S-curve position (your spine is neutral), your shoulders are pulled back slightly, and your head is not tipped forward. The following are general guidelines for applying improved posture and body mechanics to your everyday activities. Standing  When standing, keep your spine neutral and keep your feet about hip-width apart. Keep a slight bend in your knees. Your ears, shoulders, and hips should line up.  When you do a task in which you stand in one place for a long time, place one foot up on a stable object that is 2-4 inches (5-10 cm) high, such as a footstool. This helps keep your spine neutral. Sitting   When sitting, keep your spine neutral and your keep feet flat on the floor. Use a footrest, if necessary, and keep your thighs parallel to the floor. Avoid rounding  your shoulders, and avoid  tilting your head forward.  When working at a desk or a computer, keep your desk at a height where your hands are slightly lower than your elbows. Slide your chair under your desk so you are close enough to maintain good posture.  When working at a computer, place your monitor at a height where you are looking straight ahead and you do not have to tilt your head forward or downward to look at the screen. Resting When lying down and resting, avoid positions that are most painful for you. Try to support your neck in a neutral position. You can use a contour pillow or a small rolled-up towel. Your pillow should support your neck but not push on it. This information is not intended to replace advice given to you by your health care provider. Make sure you discuss any questions you have with your health care provider. Document Released: 02/13/2005 Document Revised: 10/21/2015 Document Reviewed: 01/20/2015 Elsevier Interactive Patient Education  [employer].

## 2017-12-06 ENCOUNTER — Telehealth: Payer: Self-pay | Admitting: Pulmonary Disease

## 2017-12-06 NOTE — Telephone Encounter (Signed)
Pt had it done at same place where CPAP titration was performed First few lines of report do mention details of study  Please ask [facility] to track this down otherwise send rx to diff provider please

## 2017-12-06 NOTE — Telephone Encounter (Signed)
I call Barbaraann Rondo with [facility] and he stated that he needed the original sleep study not the CPAP Titration study 05/19/2010 that Dr. Constance Holster ordered. I looked in the chart and didn't see any mention of the original sleep study or who may have done it. Barbaraann Rondo stated that if we can't find the sleep study the patient may have to start all over with a new sleep study

## 2017-12-06 NOTE — Telephone Encounter (Signed)
   Will route to Dr. Elsworth Soho for further assistance.

## 2017-12-06 NOTE — Telephone Encounter (Signed)
I have found the pt's sleep study in his chart. Called and spoke with Barbaraann Rondo at [facility]. He states that he already has the sleep study documents that are in the pt's chart. He is needing the interpretation of this study. Barbaraann Rondo is requesting help with this.  [facility] - can you help?

## 2017-12-07 ENCOUNTER — Telehealth: Payer: Self-pay | Admitting: Rheumatology

## 2017-12-07 NOTE — Telephone Encounter (Signed)
Patient advised the Doctor is Dr. Marval Regal and the number is [phone].

## 2017-12-07 NOTE — Telephone Encounter (Signed)
Called Barbaraann Rondo with [facility] stating the information provided by Dr. Elsworth Soho,  Barbaraann Rondo stated he would go back and look to see if he could find the information for the sleep study.  Nothing further needed.

## 2017-12-07 NOTE — Telephone Encounter (Signed)
Patient called stating Dr. Estanislado Pandy referred him to a kidney specialist and he forgot their name and phone number.  Patient is requesting a return call.

## 2017-12-11 ENCOUNTER — Ambulatory Visit (INDEPENDENT_AMBULATORY_CARE_PROVIDER_SITE_OTHER): Payer: Medicare Other | Admitting: Adult Health

## 2017-12-11 ENCOUNTER — Encounter: Payer: Self-pay | Admitting: Adult Health

## 2017-12-11 DIAGNOSIS — G4733 Obstructive sleep apnea (adult) (pediatric): Secondary | ICD-10-CM

## 2017-12-11 DIAGNOSIS — Z9989 Dependence on other enabling machines and devices: Secondary | ICD-10-CM | POA: Diagnosis not present

## 2017-12-11 NOTE — Assessment & Plan Note (Signed)
Doing well on CPAP  Excellent control   Plan  Patient Instructions  Continue on CPAP At bedtime  .  Keep up good work.  Work on healthy weight  Do not drive if sleepy .  Follow up with Dr. Elsworth Soho  Or Parrett NP in 1 year and As needed

## 2017-12-11 NOTE — Patient Instructions (Addendum)
Continue on CPAP At bedtime   Keep up good work .  Work on healthy weight .  Do not drive if sleepy .  Follow up with Dr. Alva  Or Parrett NP in 1 year and As needed   

## 2017-12-11 NOTE — Progress Notes (Signed)
@Patient  ID: Tyler Norris, male    DOB: 05-15-1934, 83 y.o.   MRN: [id number]  Chief Complaint  Patient presents with  . Follow-up    OSA    Referring provider: [name], MD  HPI: 18 yonever smoker with SLE diagnosed 2017for FU of OSA .  Was on INH for Quantiferon test -positive , finished course from 12/2012 . Followed by Deveshwarfor SLE, on Plaquenil . Off prednisone. Pleural effusions resolved. H/o pulmonary embolism 05/2012 - finished coumadin course 02/2013 .   Former Insurance underwriter   Significant tests/ events  Thoracentesis 04/2012 of 230 mL of monocytic exudative fluid.   CT angiogram on 06/05/12 showed small subsegmental pulmonary emboli in the right upper lobe, right middle lobe and left lower lobe. A right effusion was noted somewhat loculated and a small left effusion and was layering.   Adm 07/2014 -Pericardial effusion with near tamponade, picked up on CT angio , resolved on FU 10/2014 - on NSAIDs  PFTs - 04/2014 - unchanged, FEV1 79%, FVC 78% improves to >80% with BDs, TLC 68%, DLCO 55%  4/2017He developed left pleural effusion and underwent thoracentesis -about 60 mL>>monocytic exudate as before>>plaquanil added  12/11/2017 Follow up : OSA  Patient presents for a follow-up for sleep apnea.  Patient has recently got a new CPAP machine.  Says it is working very well.  Says it feels much more comfortable.  He is wearing it every single night at least 6 to 7 hours.  He feels rested with no significant daytime sleepiness.  Download shows average usage at 6.5 hours.  Patient is on auto CPAP 5-15 summers H2O.  AHI is 2.1.    Allergies  Allergen Reactions  . Atorvastatin Other (See Comments)    Myalgias  . Lipitor [Atorvastatin Calcium]     Myalgias    Immunization History  Administered Date(s) Administered  . Influenza Nasal 11/28/2011  . Influenza Split 11/27/2013, 11/28/2014  . Influenza,inj,Quad PF,6+ Mos 11/11/2012, 10/29/2015  .  Influenza-Unspecified 11/16/2014  . Pneumococcal Polysaccharide-23 05/15/2012  . Tdap 01/07/2017    Past Medical History:  Diagnosis Date  . AAA (abdominal aortic aneurysm) ([place])    a. resolved by ct scan 09-2011  . Anemia   . Arthritis   . Barrett's esophagus   . Carpal tunnel syndrome, bilateral   . Essential hypertension   . Fibromyalgia   . GERD (gastroesophageal reflux disease)   . History of pneumonia   . Hyperlipidemia   . Lupus (systemic lupus erythematosus) ([place])   . Nephrolithiasis   . Pericardial effusion    a. 08/22/2014 Echo: EF 60-65%, mildly dil Ao root (53mm), mildly dil LA, mod-large partially organized pericardial effusion with possible early tamponade - asymptomatic on high dose nsaids-->conservative mgmt;  b. 08/2014 Limited Echo: EF 55-60%, Triv - small effusion.  . Sleep apnea    a. uses cpap every night    Tobacco History: Social History   Tobacco Use  Smoking Status Never Smoker  Smokeless Tobacco Never Used   Counseling given: Not Answered   Outpatient Medications Prior to Visit  Medication Sig Dispense Refill  . acetaminophen (TYLENOL) 500 MG tablet Take 2 tablets (1,000 mg total) every 8 (eight) hours by mouth. (Patient taking differently: Take 1,000 mg by mouth as needed. ) 30 tablet 0  . acyclovir (ZOVIRAX) 400 MG tablet 2 (two) times daily.  0  . amLODipine (NORVASC) 5 MG tablet Take 1 tablet (5 mg total) daily by mouth.    Marland Kitchen  aspirin 81 MG tablet Take 81 mg by mouth daily.    Marland Kitchen Bioflavonoid Products (BIOFLEX PO) Take 2 tablets by mouth daily.     Marland Kitchen CALCIUM PO Take 1 tablet daily by mouth.    . Cholecalciferol (VITAMIN D3) 5000 UNITS CAPS Take 5,000 Units by mouth daily.    Marland Kitchen docusate sodium (COLACE) 100 MG capsule Take 1 capsule (100 mg total) 2 (two) times daily by mouth. 10 capsule 0  . esomeprazole (NEXIUM) 40 MG capsule Take 40 mg by mouth daily at 12 noon.    . fenofibrate (TRICOR) 145 MG tablet Take 145 mg by mouth daily.    . ferrous  sulfate 325 (65 FE) MG tablet Take 325 mg by mouth daily with breakfast.    . folic acid (FOLVITE) 962 MCG tablet Take 400 mcg by mouth daily.    . hydroxychloroquine (PLAQUENIL) 200 MG tablet Take 200 mg by mouth 2 (two) times daily.    . hydroxypropyl methylcellulose / hypromellose (ISOPTO TEARS / GONIOVISC) 2.5 % ophthalmic solution Place 1 drop as needed into both eyes for dry eyes.    . magnesium oxide (MAG-OX) 400 (241.3 MG) MG tablet Take 1 tablet (400 mg total) by mouth 3 (three) times daily. 60 tablet 0  . metoprolol tartrate (LOPRESSOR) 25 MG tablet Take 0.5 tablets (12.5 mg total) 2 (two) times daily by mouth.    . Multiple Vitamin (MULTIVITAMIN WITH MINERALS) TABS Take 1 tablet by mouth daily.    . Omega-3 Fatty Acids (FISH OIL) 1200 MG CAPS Take 1,200 mg by mouth daily.     . rosuvastatin (CRESTOR) 5 MG tablet Take 2.5 mg by mouth daily.     . traMADol (ULTRAM) 50 MG tablet Take 1 tablet (50 mg total) every 12 (twelve) hours by mouth. 20 tablet 0  . vitamin C (ASCORBIC ACID) 500 MG tablet Take 1,000 mg by mouth daily.     . vitamin E 400 UNIT capsule Take 400 Units by mouth daily.     No facility-administered medications prior to visit.      Review of Systems  Constitutional:   No  weight loss, night sweats,  Fevers, chills, fatigue, or  lassitude.  HEENT:   No headaches,  Difficulty swallowing,  Tooth/dental problems, or  Sore throat,                No sneezing, itching, ear ache, nasal congestion, post nasal drip,   CV:  No chest pain,  Orthopnea, PND, swelling in lower extremities, anasarca, dizziness, palpitations, syncope.   GI  No heartburn, indigestion, abdominal pain, nausea, vomiting, diarrhea, change in bowel habits, loss of appetite, bloody stools.   Resp: No shortness of breath with exertion or at rest.  No excess mucus, no productive cough,  No non-productive cough,  No coughing up of blood.  No change in color of mucus.  No wheezing.  No chest wall  deformity  Skin: no rash or lesions.  GU: no dysuria, change in color of urine, no urgency or frequency.  No flank pain, no hematuria   MS:  No joint pain or swelling.  No decreased range of motion.  No back pain.    Physical Exam  BP 124/74 (BP Location: Left Arm, Cuff Size: Normal)   Pulse (!) 52   Ht 5\' 9"  (1.753 m)   Wt 213 lb (96.6 kg)   SpO2 98%   BMI 31.45 kg/m   GEN: A/Ox3; pleasant , NAD, elderly  HEENT:  [place]/AT,  EACs-clear, TMs-wnl, NOSE-clear, THROAT-clear, no lesions, no postnasal drip or exudate noted. Class 2 MP airway   NECK:  Supple w/ fair ROM; no JVD; normal carotid impulses w/o bruits; no thyromegaly or nodules palpated; no lymphadenopathy.    RESP  Clear  P & A; w/o, wheezes/ rales/ or rhonchi. no accessory muscle use, no dullness to percussion  CARD:  RRR, no m/r/g, no peripheral edema, pulses intact, no cyanosis or clubbing.  GI:   Soft & nt; nml bowel sounds; no organomegaly or masses detected.   Musco: Warm bil, no deformities or joint swelling noted.   Neuro: alert, no focal deficits noted.    Skin: Warm, no lesions or rashes    Lab Results:  CBC  ProBNP   Imaging: No results found.   Assessment & Plan:   OSA on CPAP Doing well on CPAP  Excellent control   Plan  Patient Instructions  Continue on CPAP At bedtime  .  Keep up good work.  Work on healthy weight  Do not drive if sleepy .  Follow up with Dr. Elsworth Soho  Or Parrett NP in 1 year and As needed          Rexene Edison, NP 12/11/2017

## 2017-12-31 DIAGNOSIS — H0100A Unspecified blepharitis right eye, upper and lower eyelids: Secondary | ICD-10-CM | POA: Diagnosis not present

## 2017-12-31 DIAGNOSIS — H25813 Combined forms of age-related cataract, bilateral: Secondary | ICD-10-CM | POA: Diagnosis not present

## 2017-12-31 DIAGNOSIS — H0100B Unspecified blepharitis left eye, upper and lower eyelids: Secondary | ICD-10-CM | POA: Diagnosis not present

## 2017-12-31 DIAGNOSIS — Z79899 Other long term (current) drug therapy: Secondary | ICD-10-CM | POA: Diagnosis not present

## 2018-01-08 DIAGNOSIS — Z23 Encounter for immunization: Secondary | ICD-10-CM | POA: Diagnosis not present

## 2018-01-21 DIAGNOSIS — R809 Proteinuria, unspecified: Secondary | ICD-10-CM | POA: Diagnosis not present

## 2018-01-21 DIAGNOSIS — N390 Urinary tract infection, site not specified: Secondary | ICD-10-CM | POA: Diagnosis not present

## 2018-01-21 DIAGNOSIS — I129 Hypertensive chronic kidney disease with stage 1 through stage 4 chronic kidney disease, or unspecified chronic kidney disease: Secondary | ICD-10-CM | POA: Diagnosis not present

## 2018-01-21 DIAGNOSIS — M329 Systemic lupus erythematosus, unspecified: Secondary | ICD-10-CM | POA: Diagnosis not present

## 2018-01-21 DIAGNOSIS — K219 Gastro-esophageal reflux disease without esophagitis: Secondary | ICD-10-CM | POA: Diagnosis not present

## 2018-01-21 DIAGNOSIS — N182 Chronic kidney disease, stage 2 (mild): Secondary | ICD-10-CM | POA: Diagnosis not present

## 2018-01-21 DIAGNOSIS — I714 Abdominal aortic aneurysm, without rupture: Secondary | ICD-10-CM | POA: Diagnosis not present

## 2018-01-23 DIAGNOSIS — M159 Polyosteoarthritis, unspecified: Secondary | ICD-10-CM | POA: Diagnosis not present

## 2018-01-23 DIAGNOSIS — M3210 Systemic lupus erythematosus, organ or system involvement unspecified: Secondary | ICD-10-CM | POA: Diagnosis not present

## 2018-01-23 DIAGNOSIS — E119 Type 2 diabetes mellitus without complications: Secondary | ICD-10-CM | POA: Diagnosis not present

## 2018-01-23 DIAGNOSIS — E782 Mixed hyperlipidemia: Secondary | ICD-10-CM | POA: Diagnosis not present

## 2018-01-23 DIAGNOSIS — R350 Frequency of micturition: Secondary | ICD-10-CM | POA: Diagnosis not present

## 2018-01-23 DIAGNOSIS — I2510 Atherosclerotic heart disease of native coronary artery without angina pectoris: Secondary | ICD-10-CM | POA: Diagnosis not present

## 2018-01-23 DIAGNOSIS — N182 Chronic kidney disease, stage 2 (mild): Secondary | ICD-10-CM | POA: Diagnosis not present

## 2018-01-23 DIAGNOSIS — G4733 Obstructive sleep apnea (adult) (pediatric): Secondary | ICD-10-CM | POA: Diagnosis not present

## 2018-02-05 ENCOUNTER — Other Ambulatory Visit: Payer: Self-pay | Admitting: Nephrology

## 2018-02-05 DIAGNOSIS — N182 Chronic kidney disease, stage 2 (mild): Secondary | ICD-10-CM

## 2018-02-06 DIAGNOSIS — I129 Hypertensive chronic kidney disease with stage 1 through stage 4 chronic kidney disease, or unspecified chronic kidney disease: Secondary | ICD-10-CM | POA: Diagnosis not present

## 2018-02-06 DIAGNOSIS — N182 Chronic kidney disease, stage 2 (mild): Secondary | ICD-10-CM | POA: Diagnosis not present

## 2018-02-07 ENCOUNTER — Ambulatory Visit
Admission: RE | Admit: 2018-02-07 | Discharge: 2018-02-07 | Disposition: A | Discharge status: Home / Self Care | Payer: Medicare Other | Source: Ambulatory Visit | Attending: Nephrology | Admitting: Nephrology

## 2018-02-07 DIAGNOSIS — N281 Cyst of kidney, acquired: Secondary | ICD-10-CM | POA: Diagnosis not present

## 2018-02-07 DIAGNOSIS — N182 Chronic kidney disease, stage 2 (mild): Secondary | ICD-10-CM | POA: Diagnosis not present

## 2018-02-11 DIAGNOSIS — N401 Enlarged prostate with lower urinary tract symptoms: Secondary | ICD-10-CM | POA: Diagnosis not present

## 2018-02-11 DIAGNOSIS — R3129 Other microscopic hematuria: Secondary | ICD-10-CM | POA: Diagnosis not present

## 2018-02-11 DIAGNOSIS — R350 Frequency of micturition: Secondary | ICD-10-CM | POA: Diagnosis not present

## 2018-02-25 DIAGNOSIS — R350 Frequency of micturition: Secondary | ICD-10-CM | POA: Diagnosis not present

## 2018-02-25 DIAGNOSIS — N401 Enlarged prostate with lower urinary tract symptoms: Secondary | ICD-10-CM | POA: Diagnosis not present

## 2018-03-13 ENCOUNTER — Ambulatory Visit: Payer: Medicare Other | Admitting: Adult Health

## 2018-03-18 DIAGNOSIS — N401 Enlarged prostate with lower urinary tract symptoms: Secondary | ICD-10-CM | POA: Diagnosis not present

## 2018-03-18 DIAGNOSIS — R350 Frequency of micturition: Secondary | ICD-10-CM | POA: Diagnosis not present

## 2018-04-15 DIAGNOSIS — N401 Enlarged prostate with lower urinary tract symptoms: Secondary | ICD-10-CM | POA: Diagnosis not present

## 2018-04-15 DIAGNOSIS — R350 Frequency of micturition: Secondary | ICD-10-CM | POA: Diagnosis not present

## 2018-04-29 DIAGNOSIS — H8111 Benign paroxysmal vertigo, right ear: Secondary | ICD-10-CM | POA: Diagnosis not present

## 2018-04-29 DIAGNOSIS — H6591 Unspecified nonsuppurative otitis media, right ear: Secondary | ICD-10-CM | POA: Diagnosis not present

## 2018-04-30 DIAGNOSIS — K219 Gastro-esophageal reflux disease without esophagitis: Secondary | ICD-10-CM | POA: Diagnosis not present

## 2018-04-30 DIAGNOSIS — K2270 Barrett's esophagus without dysplasia: Secondary | ICD-10-CM | POA: Diagnosis not present

## 2018-04-30 DIAGNOSIS — I10 Essential (primary) hypertension: Secondary | ICD-10-CM | POA: Diagnosis not present

## 2018-04-30 DIAGNOSIS — I7789 Other specified disorders of arteries and arterioles: Secondary | ICD-10-CM | POA: Diagnosis not present

## 2018-04-30 DIAGNOSIS — G4733 Obstructive sleep apnea (adult) (pediatric): Secondary | ICD-10-CM | POA: Diagnosis not present

## 2018-04-30 DIAGNOSIS — N182 Chronic kidney disease, stage 2 (mild): Secondary | ICD-10-CM | POA: Diagnosis not present

## 2018-04-30 DIAGNOSIS — E782 Mixed hyperlipidemia: Secondary | ICD-10-CM | POA: Diagnosis not present

## 2018-04-30 DIAGNOSIS — Z86010 Personal history of colonic polyps: Secondary | ICD-10-CM | POA: Diagnosis not present

## 2018-04-30 DIAGNOSIS — I2510 Atherosclerotic heart disease of native coronary artery without angina pectoris: Secondary | ICD-10-CM | POA: Diagnosis not present

## 2018-04-30 DIAGNOSIS — M3210 Systemic lupus erythematosus, organ or system involvement unspecified: Secondary | ICD-10-CM | POA: Diagnosis not present

## 2018-04-30 DIAGNOSIS — E139 Other specified diabetes mellitus without complications: Secondary | ICD-10-CM | POA: Diagnosis not present

## 2018-04-30 DIAGNOSIS — E6609 Other obesity due to excess calories: Secondary | ICD-10-CM | POA: Diagnosis not present

## 2018-05-06 NOTE — Progress Notes (Signed)
Office Visit Note  Patient: Tyler Norris             Date of Birth: 01-23-1935           MRN: [id number]             PCP: [name], MD Referring: [name], MD Visit Date: 05/20/2018 Occupation: @GUAROCC @  Subjective:  Mediation monitoring    History of Present Illness: Tyler Norris is a 84 y.o. male with history of systemic lupus erythematosus.  Patient is taking Plaquenil 200 mg by mouth twice daily.  He has not missed any doses recently.  He denies any recent infections.  He denies any symptoms of a flare. He denies any joint pain or joint swelling at this time.  He denies any recent facial rashes, Raynaud's, photosensitivity, or hair loss.  He continues to have some mouth dryness but denies any eye dryness.  He denies any increased fatigue, swollen lymph nodes, fevers.  Denies any sores in his mouth or nose.  He denies any chest pain or shortness of breath. He has been undergoing treatment for vertigo, and he will be starting PT.     Activities of Daily Living:  Patient reports morning stiffness for 0 minutes.   Patient Denies nocturnal pain.  Difficulty dressing/grooming: Denies Difficulty climbing stairs: Denies Difficulty getting out of chair: Reports Difficulty using hands for taps, buttons, cutlery, and/or writing: Denies  Review of Systems  Constitutional: Negative for fatigue and night sweats.  HENT: Positive for mouth dryness. Negative for mouth sores and nose dryness.   Eyes: Negative for redness, visual disturbance and dryness.  Respiratory: Negative for cough, hemoptysis, shortness of breath and difficulty breathing.   Cardiovascular: Negative for chest pain, palpitations, hypertension, irregular heartbeat and swelling in legs/feet.  Gastrointestinal: Negative for blood in stool, constipation and diarrhea.  Endocrine: Negative for increased urination.  Genitourinary: Negative for painful urination.  Musculoskeletal: Negative for arthralgias,  joint pain, joint swelling, myalgias, muscle weakness, morning stiffness, muscle tenderness and myalgias.  Skin: Negative for color change, rash, hair loss, nodules/bumps, skin tightness, ulcers and sensitivity to sunlight.  Allergic/Immunologic: Negative for susceptible to infections.  Neurological: Positive for dizziness. Negative for fainting, memory loss, night sweats and weakness.  Hematological: Negative for swollen glands.  Psychiatric/Behavioral: Negative for depressed mood and sleep disturbance. The patient is not nervous/anxious.     PMFS History:  Patient Active Problem List   Diagnosis Date Noted  . SDH (subdural hematoma) ([place]) 01/08/2017  . Pleural effusion on left 06/02/2015  . Aneurysm of thoracic aorta ([place]) 06/02/2015  . SOB (shortness of breath) 08/22/2014  . Pericardial effusion 08/22/2014  . Lupus (systemic lupus erythematosus) ([place])   . 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 PE (pulmonary embolism) 06/05/2012  . Pleural effusion exudative 05/26/2012  . Essential hypertension 05/13/2012  . OSA on CPAP 05/13/2012  . Cardiomegaly 05/13/2012  . Abdominal aneurysm without mention of rupture 06/27/2011    Past Medical History:  Diagnosis Date  . AAA (abdominal aortic aneurysm) ([place])    a. resolved by ct scan 09-2011  . Anemia   . Arthritis   . Barrett's esophagus   . Carpal tunnel syndrome, bilateral   . Essential hypertension   . Fibromyalgia   . GERD (gastroesophageal reflux disease)   . History of pneumonia   . Hyperlipidemia   . Lupus (systemic lupus erythematosus) ([place])   . Nephrolithiasis   . Pericardial effusion    a. 08/22/2014 Echo:  EF 60-65%, mildly dil Ao root (64mm), mildly dil LA, mod-large partially organized pericardial effusion with possible early tamponade - asymptomatic on high dose nsaids-->conservative mgmt;  b. 08/2014 Limited Echo: EF 55-60%, Triv - small effusion.  . Sleep apnea    a. uses cpap every night    Family History    Problem Relation Age of Onset  . Diabetes Mother   . Diabetes Sister   . Stroke Sister   . Healthy Daughter   . Healthy Son    Past Surgical History:  Procedure Laterality Date  . APPENDECTOMY    . CARPAL TUNNEL RELEASE  10/12/2011   Procedure: CARPAL TUNNEL RELEASE;  Surgeon: Cammie Sickle., MD;  Location: [facility];  Service: Orthopedics;  Laterality: Left;  . CARPAL TUNNEL RELEASE  12/01/2011   Procedure: CARPAL TUNNEL RELEASE;  Surgeon: Cammie Sickle., MD;  Location: [facility];  Service: Orthopedics;  Laterality: Right;  . CYSTOSCOPY W/ STONE MANIPULATION  [id number]  . HAND SURGERY     scar repaired lt hand  . HERNIA REPAIR  1966  . KNEE ARTHROPLASTY    . KNEE ARTHROSCOPY Left   . TONSILLECTOMY     Social History   Social History Narrative  . Not on file   Immunization History  Administered Date(s) Administered  . Influenza Nasal 11/28/2011  . Influenza Split 11/27/2013, 11/28/2014  . Influenza,inj,Quad PF,6+ Mos 11/11/2012, 10/29/2015  . Influenza-Unspecified 11/16/2014  . Pneumococcal Polysaccharide-23 05/15/2012  . Tdap 01/07/2017     Objective: Vital Signs: BP (!) 169/71 (BP Location: Left Arm, Patient Position: Sitting, Cuff Size: Normal)   Pulse (!) 46   Resp 14   Ht 5\' 9"  (1.753 m)   Wt 214 lb (97.1 kg)   BMI 31.60 kg/m    Physical Exam Vitals signs and nursing note reviewed.  Constitutional:      Appearance: He is well-developed.  HENT:     Head: Normocephalic and atraumatic.  Eyes:     Conjunctiva/sclera: Conjunctivae normal.     Pupils: Pupils are equal, round, and reactive to light.  Neck:     Musculoskeletal: Normal range of motion and neck supple.  Cardiovascular:     Rate and Rhythm: Normal rate and regular rhythm.     Heart sounds: Normal heart sounds.  Pulmonary:     Effort: Pulmonary effort is normal.     Breath sounds: Normal breath sounds.  Abdominal:     General: Bowel sounds are normal.      Palpations: Abdomen is soft.  Lymphadenopathy:     Cervical: No cervical adenopathy.  Skin:    General: Skin is warm and dry.     Capillary Refill: Capillary refill takes less than 2 seconds.  Neurological:     Mental Status: He is alert and oriented to person, place, and time.  Psychiatric:        Behavior: Behavior normal.      Musculoskeletal Exam: C-spine limited ROM.  Thoracic kyphosis noted.  Shoulder joints good ROM with no discomfort.  Elbow joint contractures.  Good ROM of both wrist joints.  Synovial thickening of bilateral 1st, 2nd, and 3rd MCP joints.  PIP and DIP synovial thickening but no synovitis.  Complete fist formation bilaterally.  Hip joints, knee joints, ankle joints, MTPs, PIPs, and DIPs good ROM with no synovitis.  No warmth or effusion of knee joints.  No tenderness or swelling of ankle joints.  Pedal edema bilaterally.  CDAI Exam: CDAI Score: Not documented Patient Global Assessment: Not documented; Provider Global Assessment: Not documented Swollen: Not documented; Tender: Not documented Joint Exam   Not documented   There is currently no information documented on the homunculus. Go to the Rheumatology activity and complete the homunculus joint exam.  Investigation: No additional findings.  Imaging: No results found.  Recent Labs: Lab Results  Component Value Date   WBC 5.4 05/17/2017   HGB 13.8 05/17/2017   PLT 202 05/17/2017   NA 143 05/17/2017   K 4.0 05/17/2017   CL 109 05/17/2017   CO2 26 05/17/2017   GLUCOSE 129 (H) 05/17/2017   BUN 19 05/17/2017   CREATININE 0.92 05/17/2017   BILITOT 0.7 05/17/2017   ALKPHOS 43 08/22/2014   AST 18 05/17/2017   ALT 13 05/17/2017   PROT 6.2 05/17/2017   ALBUMIN 3.3 (L) 08/22/2014   CALCIUM 9.4 05/17/2017   GFRAA 91 05/17/2017    Speciality Comments: PLQ Eye Exam: 12/31/17 WNL @ Shon Hough, MD Follow up in 1 year.  Procedures:  No procedures performed Allergies: Atorvastatin and  Lipitor [atorvastatin calcium]     Assessment / Plan:     Visit Diagnoses: Other systemic lupus erythematosus with other organ involvement ([place]) - +ANA, +RF. -He has no signs or symptoms of a lupus flare.  He is clinically doing well on Plaquenil 200 mg by mouth BID.  He has not missed any doses and has not had any recent infections.  He has no joint pain or synovitis at this time.  He has no oral or nasal ulcerations.  No worsening fatigue, cervical lymphadenopathy, or recent fevers.  No malar rash noted.  No symptoms of Raynaud's and no digital ulcerations or signs of gangrene.  He has no photosensitivity or signs of alopecia.  He experiences mouth dryness but no eye dryness. No parotid swelling or tenderness.  He denies any shortness of breath or chest pain.  Lungs clear to auscultation.  He will continue on PLQ 200 mg BID.  A refill of PLQ was sent to the pharmacy.  We will check autoimmune labs today.  He was advised to notify us if he develops signs or symptoms of a flare.  He will follow up in 5 months.   Plan: Urinalysis, Routine w reflex microscopic, COMPLETE METABOLIC PANEL WITH GFR, CBC with Differential/Platelet, Anti-DNA antibody, double-stranded, C3 and C4, Sedimentation rate, VITAMIN D 25 Hydroxy (Vit-D Deficiency, Fractures)  High risk medication use - Plaquenil 200 mg twice daily. Last Plaquenil eye exam normal on 12/31/17.  Most recent CBC/CMP stable on 05/17/17.  Due for CBC/CMP today and will monitor every 5 months. - Plan: COMPLETE METABOLIC PANEL WITH GFR, CBC with Differential/Platelet  Other medical conditions are listed as follows:   Positive QuantiFERON-TB Gold test  History of pleural effusion - in the past.  History of gastroesophageal reflux (GERD)  History of sleep apnea  History of pulmonary embolism  Other proteinuria - patient was evaluated by nephrology.   Orders: Orders Placed This Encounter  Procedures  . Urinalysis, Routine w reflex microscopic  .  COMPLETE METABOLIC PANEL WITH GFR  . CBC with Differential/Platelet  . Anti-DNA antibody, double-stranded  . C3 and C4  . Sedimentation rate  . VITAMIN D 25 Hydroxy (Vit-D Deficiency, Fractures)   Meds ordered this encounter  Medications  . hydroxychloroquine (PLAQUENIL) 200 MG tablet    Sig: Take 1 tablet (200 mg total) by mouth 2 (two) times daily.  Dispense:  180 tablet    Refill:  1      Follow-Up Instructions: Return in about 5 months (around 10/20/2018) for Systemic lupus erythematosus.   Ofilia Neas, PA-C   I examined and evaluated the patient with Hazel Sams PA.  Patient is clinically doing well.  He had no synovitis on my examination.  The plan of care was discussed as noted above.  Bo Merino, MD  Note - This record has been created using Editor, commissioning.  Chart creation errors have been sought, but may not always  have been located. Such creation errors do not reflect on  the standard of medical care.

## 2018-05-20 ENCOUNTER — Encounter: Payer: Self-pay | Admitting: Rheumatology

## 2018-05-20 ENCOUNTER — Other Ambulatory Visit: Payer: Self-pay

## 2018-05-20 ENCOUNTER — Ambulatory Visit (INDEPENDENT_AMBULATORY_CARE_PROVIDER_SITE_OTHER): Payer: Medicare Other | Admitting: Rheumatology

## 2018-05-20 VITALS — BP 169/71 | HR 46 | Resp 14 | Ht 69.0 in | Wt 214.0 lb

## 2018-05-20 DIAGNOSIS — R7612 Nonspecific reaction to cell mediated immunity measurement of gamma interferon antigen response without active tuberculosis: Secondary | ICD-10-CM | POA: Diagnosis not present

## 2018-05-20 DIAGNOSIS — Z79899 Other long term (current) drug therapy: Secondary | ICD-10-CM | POA: Diagnosis not present

## 2018-05-20 DIAGNOSIS — R808 Other proteinuria: Secondary | ICD-10-CM | POA: Diagnosis not present

## 2018-05-20 DIAGNOSIS — I712 Thoracic aortic aneurysm, without rupture, unspecified: Secondary | ICD-10-CM

## 2018-05-20 DIAGNOSIS — Z8709 Personal history of other diseases of the respiratory system: Secondary | ICD-10-CM | POA: Diagnosis not present

## 2018-05-20 DIAGNOSIS — G4733 Obstructive sleep apnea (adult) (pediatric): Secondary | ICD-10-CM

## 2018-05-20 DIAGNOSIS — M3219 Other organ or system involvement in systemic lupus erythematosus: Secondary | ICD-10-CM

## 2018-05-20 DIAGNOSIS — Z86711 Personal history of pulmonary embolism: Secondary | ICD-10-CM

## 2018-05-20 DIAGNOSIS — Z8669 Personal history of other diseases of the nervous system and sense organs: Secondary | ICD-10-CM

## 2018-05-20 DIAGNOSIS — I10 Essential (primary) hypertension: Secondary | ICD-10-CM | POA: Diagnosis not present

## 2018-05-20 DIAGNOSIS — Z9989 Dependence on other enabling machines and devices: Secondary | ICD-10-CM | POA: Diagnosis not present

## 2018-05-20 DIAGNOSIS — Z8719 Personal history of other diseases of the digestive system: Secondary | ICD-10-CM

## 2018-05-20 MED ORDER — HYDROXYCHLOROQUINE SULFATE 200 MG PO TABS: 200.0000 mg | ORAL_TABLET | Freq: Two times a day (BID) | ORAL | 1 refills | Status: DC

## 2018-05-21 LAB — COMPLETE METABOLIC PANEL WITH GFR
AG Ratio: 2.3 (calc) (ref 1.0–2.5)
ALT: 11 U/L (ref 9–46)
AST: 14 U/L (ref 10–35)
Albumin: 4.5 g/dL (ref 3.6–5.1)
Alkaline phosphatase (APISO): 32 U/L — ABNORMAL LOW (ref 35–144)
BUN: 22 mg/dL (ref 7–25)
CALCIUM: 9.8 mg/dL (ref 8.6–10.3)
CO2: 29 mmol/L (ref 20–32)
Chloride: 106 mmol/L (ref 98–110)
Creat: 0.93 mg/dL (ref 0.70–1.11)
GFR, EST NON AFRICAN AMERICAN: 77 mL/min/{1.73_m2} (ref >=60)
GFR, Est African American: 89 mL/min/{1.73_m2} (ref >=60)
GLOBULIN: 2 g/dL (ref 1.9–3.7)
Glucose, Bld: 141 mg/dL — ABNORMAL HIGH (ref 65–99)
Potassium: 4.5 mmol/L (ref 3.5–5.3)
Sodium: 143 mmol/L (ref 135–146)
Total Bilirubin: 0.6 mg/dL (ref 0.2–1.2)
Total Protein: 6.5 g/dL (ref 6.1–8.1)

## 2018-05-21 LAB — CBC WITH DIFFERENTIAL/PLATELET
ABSOLUTE MONOCYTES: 459 {cells}/uL (ref 200–950)
BASOS ABS: 32 {cells}/uL (ref 0–200)
Basophils Relative: 0.6 %
EOS ABS: 119 {cells}/uL (ref 15–500)
EOS PCT: 2.2 %
HCT: 40.3 % (ref 38.5–50.0)
Hemoglobin: 13.9 g/dL (ref 13.2–17.1)
Lymphs Abs: 1172 cells/uL (ref 850–3900)
MCH: 31.5 pg (ref 27.0–33.0)
MCHC: 34.5 g/dL (ref 32.0–36.0)
MCV: 91.4 fL (ref 80.0–100.0)
MPV: 10.9 fL (ref 7.5–12.5)
Monocytes Relative: 8.5 %
Neutro Abs: 3618 cells/uL (ref 1500–7800)
Neutrophils Relative %: 67 %
PLATELETS: 191 10*3/uL (ref 140–400)
RBC: 4.41 10*6/uL (ref 4.20–5.80)
RDW: 12.3 % (ref 11.0–15.0)
TOTAL LYMPHOCYTE: 21.7 %
WBC: 5.4 10*3/uL (ref 3.8–10.8)

## 2018-05-21 LAB — URINALYSIS, ROUTINE W REFLEX MICROSCOPIC
BACTERIA UA: NONE SEEN /HPF
Bilirubin Urine: NEGATIVE
Glucose, UA: NEGATIVE
HGB URINE DIPSTICK: NEGATIVE
Hyaline Cast: NONE SEEN /LPF
Ketones, ur: NEGATIVE
Leukocytes,Ua: NEGATIVE
Nitrite: NEGATIVE
Specific Gravity, Urine: 1.022 (ref 1.001–1.03)
Squamous Epithelial / LPF: NONE SEEN /HPF (ref <=5)

## 2018-05-21 LAB — ANTI-DNA ANTIBODY, DOUBLE-STRANDED: ds DNA Ab: 15 IU/mL — ABNORMAL HIGH

## 2018-05-21 LAB — C3 AND C4
C3 COMPLEMENT: 128 mg/dL
C4 COMPLEMENT: 21 mg/dL

## 2018-05-21 LAB — SEDIMENTATION RATE: Sed Rate: 6 mm/h (ref 0–20)

## 2018-05-21 LAB — VITAMIN D 25 HYDROXY (VIT D DEFICIENCY, FRACTURES): Vit D, 25-Hydroxy: 40 ng/mL (ref 30–100)

## 2018-05-21 NOTE — Progress Notes (Signed)
UA revealed 1+ protein.  He has a history of proteinuria.  He has been evaluated by nephrology.   Glucose is elevated-141. Alk phos is low.  Rest of CMP WNL. CBC WNL. Sed rate WNL.  Complements WNL. Vitamin D is 40 which is within desirable range. Please recommend maintenance dose of vitamin D.

## 2018-05-21 NOTE — Progress Notes (Signed)
Reviewed lab work with Dr. Estanislado Pandy.  DsDNA is elevated.  Complements and Sed rate are WNL.  He was clinically doing well at his office visit yesterday.  Please advise patient to notify us if he develops any signs or symptoms of a lupus flare.  We will recheck labs in 5 months.

## 2018-06-17 DIAGNOSIS — N401 Enlarged prostate with lower urinary tract symptoms: Secondary | ICD-10-CM | POA: Diagnosis not present

## 2018-06-17 DIAGNOSIS — R351 Nocturia: Secondary | ICD-10-CM | POA: Diagnosis not present

## 2018-06-17 DIAGNOSIS — R350 Frequency of micturition: Secondary | ICD-10-CM | POA: Diagnosis not present

## 2018-06-18 DIAGNOSIS — K5900 Constipation, unspecified: Secondary | ICD-10-CM | POA: Diagnosis not present

## 2018-06-18 DIAGNOSIS — Z86010 Personal history of colonic polyps: Secondary | ICD-10-CM | POA: Diagnosis not present

## 2018-06-18 DIAGNOSIS — K219 Gastro-esophageal reflux disease without esophagitis: Secondary | ICD-10-CM | POA: Diagnosis not present

## 2018-06-20 ENCOUNTER — Other Ambulatory Visit: Payer: Self-pay

## 2018-06-20 ENCOUNTER — Ambulatory Visit: Payer: Medicare Other | Attending: Family Medicine | Admitting: Rehabilitative and Restorative Service Providers"

## 2018-06-20 DIAGNOSIS — R2689 Other abnormalities of gait and mobility: Secondary | ICD-10-CM | POA: Diagnosis not present

## 2018-06-20 DIAGNOSIS — R2681 Unsteadiness on feet: Secondary | ICD-10-CM

## 2018-06-20 DIAGNOSIS — H8111 Benign paroxysmal vertigo, right ear: Secondary | ICD-10-CM

## 2018-06-20 NOTE — Patient Instructions (Signed)
Access Code: [email or web address]  URL: [email or web address]  Date: 06/20/2018  Prepared by: Rudell Cobb   Exercises Rolling right<>left sides for vestibular habituation - 5 reps - 1 sets                            - 3x daily - 7x weekly

## 2018-06-21 NOTE — Therapy (Signed)
[facility] [address] [place], [place], [address] Phone: [phone]   Fax:  [phone]  Physical Therapy Evaluation  Patient Details  Name: Tyler Norris MRN: [id number] Date of Birth: 06-17-1934 Referring Provider (PT): [name], MD   Encounter Date: 06/20/2018  PT End of Session - 06/20/18 0956    Visit Number  1    Number of Visits  4    Date for PT Re-Evaluation  08/20/18    Authorization Type  Medicare    PT Start Time  1118    PT Stop Time  1155    PT Time Calculation (min)  37 min    Activity Tolerance  Patient tolerated treatment well    Behavior During Therapy  [facility] for tasks assessed/performed       Past Medical History:  Diagnosis Date  . AAA (abdominal aortic aneurysm) ([place])    a. resolved by ct scan 09-2011  . Anemia   . Arthritis   . Barrett's esophagus   . Carpal tunnel syndrome, bilateral   . Essential hypertension   . Fibromyalgia   . GERD (gastroesophageal reflux disease)   . History of pneumonia   . Hyperlipidemia   . Lupus (systemic lupus erythematosus) ([place])   . Nephrolithiasis   . Pericardial effusion    a. 08/22/2014 Echo: EF 60-65%, mildly dil Ao root (68mm), mildly dil LA, mod-large partially organized pericardial effusion with possible early tamponade - asymptomatic on high dose nsaids-->conservative mgmt;  b. 08/2014 Limited Echo: EF 55-60%, Triv - small effusion.  . Sleep apnea    a. uses cpap every night    Past Surgical History:  Procedure Laterality Date  . APPENDECTOMY    . CARPAL TUNNEL RELEASE  10/12/2011   Procedure: CARPAL TUNNEL RELEASE;  Surgeon: Cammie Sickle., MD;  Location: [facility];  Service: Orthopedics;  Laterality: Left;  . CARPAL TUNNEL RELEASE  12/01/2011   Procedure: CARPAL TUNNEL RELEASE;  Surgeon: Cammie Sickle., MD;  Location: [facility];  Service: Orthopedics;  Laterality: Right;  . CYSTOSCOPY W/  STONE MANIPULATION  [id number]  . HAND SURGERY     scar repaired lt hand  . HERNIA REPAIR  1966  . KNEE ARTHROPLASTY    . KNEE ARTHROSCOPY Left   . TONSILLECTOMY      There were no vitals filed for this visit.   Subjective Assessment - 06/20/18 1121    Subjective  The patient notes onset of vertigo 2 months ago when sitting up in bed noting room spinning sensation of dizziness.  He waits for a few seconds when rising.  He notes sympotoms are worse to the right, and not present to the left side.  He thinks it is improving, but not fully resolved.   He he hasn't been able to fully complete the epley's maneuver given to him by his MD.  He has only gotten to step 2.  He is avoiding quick head movements.  He notes his balance "is not what it used to be".     Pertinent History  SDH in 2018, fibromyalgia, sleep apnea, lupus, hyperlipidemia, HTN.      Patient Stated Goals  Get rid of dizziness.      Currently in Pain?  No/denies   sore from landscaping;         [facility] PT Assessment - 06/20/18 1126      Assessment   Medical Diagnosis  vertigo  Referring Provider (PT)  [name], MD    Onset Date/Surgical Date  --   2 months ago   Prior Therapy  none      Precautions   Precautions  Fall      Restrictions   Weight Bearing Restrictions  No      Balance Screen   Has the patient fallen in the past 6 months  Yes    How many times?  1- has tipped over on a bench in the yard    Has the patient had a decrease in activity level because of a fear of falling?   No    Is the patient reluctant to leave their home because of a fear of falling?   No      Home Environment   Living Environment  Private residence    Living Arrangements  Alone    Type of [place] Access  Level entry   holds rail on 12 steps on [place]  One level    [place]  None      Prior Function   Level of [place]  yard work           Vestibular  Assessment - 06/20/18 1129      Vestibular Assessment   General Observation  Walks into clinic independently on level surfaces.      Symptom Behavior   Subjective history of current problem  2 months ago had sudden onset of dizziness when getting out of bed    Type of Dizziness   Spinning    Frequency of Dizziness  daily    Duration of Dizziness  seconds    Symptom Nature  Positional    Aggravating Factors  Turning head sideways;Lying supine;Mornings    Relieving Factors  Head stationary    Progression of Symptoms  Better      Oculomotor Exam   Oculomotor Alignment  Normal    Ocular ROM  wnls    Spontaneous  Absent    Gaze-induced   Absent    Smooth Pursuits  Intact    Saccades  Intact      Vestibulo-Ocular Reflex   VOR 1 Head Only (x 1 viewing)  x 10 reps, notes neck stiffness     Comment  Notes discomfort with head rotation to the right, some limitation in horizontal rotation.      Positional Testing   Dix-Hallpike  Dix-Hallpike Right    Sidelying Test  Sidelying Right;Sidelying Left    Horizontal Canal Testing  Horizontal Canal Right;Horizontal Canal Left      Dix-Hallpike Right   Dix-Hallpike Right Duration  15 seconds    Dix-Hallpike Right Symptoms  Upbeat, right rotatory nystagmus      Sidelying Right   Sidelying Right Duration  seconds    Sidelying Right Symptoms  No nystagmus   notes sensation that it is coming over him, no spinning     Sidelying Left   Sidelying Left Duration  none    Sidelying Left Symptoms  No nystagmus      Horizontal Canal Right   Horizontal Canal Right Duration  10-15 seconds    Horizontal Canal Right Symptoms  Ageotrophic   noted an upbeat, right nystagmus (posterior canal)     Horizontal Canal Left   Horizontal Canal Left Duration  none    Horizontal Canal Left Symptoms  Normal      Positional  Sensitivities   Positional Sensitivities Comments  During positional testing, R rolling first has an upbeat, rotary nystagmus and then  switches to mild apogeotropic nystagmus.  This may indicate multi-canal BPPV.  Treated posterior canal today and provided habituation for horiozntal canal.          Objective measurements completed on examination: See above findings.       Vestibular Treatment/Exercise - 06/21/18 0754      Vestibular Treatment/Exercise   Vestibular Treatment Provided  Canalith Repositioning;Habituation    Canalith Repositioning  Epley Manuever Right    Habituation Exercises  Horizontal Roll       EPLEY MANUEVER RIGHT   Number of Reps   2    Overall Response  Improved Symptoms    Response Details   Still notes a subjective trace of symptoms; nystagmus not viewed on 2nd rep      Horizontal Roll   Number of Reps   2    Symptom Description   on first rep (after Epley's) patient still c/o mild sensation of dizziness.  On 2nd rep, no symptoms noted.  PT provided habituation x 5 reps.            PT Education - 06/21/18 0749    Education Details  habituation HEP    Person(s) Educated  Patient    Methods  Explanation;Demonstration;Handout    Comprehension  Verbalized understanding;Returned demonstration          PT Long Term Goals - 06/21/18 0801      PT LONG TERM GOAL #1   Title  The patient will be indep with HEP for habituation and high level balance, if indicated.    Baseline  *set goals for 8 weeks due to clinic changes b/c of COVID 19    Time  8    Period  Weeks    Target Date  08/20/18      PT LONG TERM GOAL #2   Title  The patient will have negative R sidelying and R horiozntal roll testing indicating resolution of BPPv.    Time  8    Period  Weeks    Target Date  08/20/18      PT LONG TERM GOAL #3   Title  The patient will be further assessed on balance and HEP provided as indicated.    Time  8    Period  Weeks    Target Date  08/20/18             Plan - 06/21/18 0802    Clinical Impression Statement  The patient is an 84 year old male presenting to  outpatient physical therapy for BPPV.  Nystagmus direction indicates R posterior canal involvement as primary canal, but presence of apogeotropic nystagmus in horiozntal roll position (after upbeat, R roary nystagmus resolves) may also indicate horizontal cupulolithiasis.  PT to reassess next session to determine patient's response to epley's maneuver and horiozntal roll habituation.   Due to clinic changes related to COVID, PT will follow-up in 2 weeks with a reduced frequency of visits and providing further HEP for guidance.    Personal Factors and Comorbidities  Age;Comorbidity 1    Comorbidities  h/o SDH s/p fall 2018    Examination-Activity Limitations  Bed Mobility    Stability/Clinical Decision Making  Stable/Uncomplicated    Clinical Decision Making  Low    Rehab Potential  Good    PT Frequency  1x / week    PT Duration  4 weeks  PT Treatment/Interventions  ADLs/Self Care Home Management;Therapeutic activities;Therapeutic exercise;Balance training;Neuromuscular re-education;Patient/family education;Vestibular;Canalith Repostioning    PT Next Visit Plan  Check positional testing and treat as indicated, provide high level balance HEP after further assessment.    PT Home Exercise Plan  [email or web address]    Consulted and Agree with Plan of Care  Patient       Patient will benefit from skilled therapeutic intervention in order to improve the following deficits and impairments:  Dizziness, Abnormal gait, Decreased balance  Visit Diagnosis: BPPV (benign paroxysmal positional vertigo), right  Unsteadiness on feet  Other abnormalities of gait and mobility     Problem List Patient Active Problem List   Diagnosis Date Noted  . SDH (subdural hematoma) ([place]) 01/08/2017  . Pleural effusion on left 06/02/2015  . Aneurysm of thoracic aorta ([place]) 06/02/2015  . SOB (shortness of breath) 08/22/2014  . Pericardial effusion 08/22/2014  . Lupus (systemic lupus erythematosus) ([place])   . Positive  QuantiFERON-TB Gold test 07/05/2012  . PE (pulmonary embolism) 06/05/2012  . Pleural effusion exudative 05/26/2012  . Essential hypertension 05/13/2012  . OSA on CPAP 05/13/2012  . Cardiomegaly 05/13/2012  . Abdominal aneurysm without mention of rupture 06/27/2011    Rynn Markiewicz, PT 06/21/2018, 8:08 AM  [facility] [address] [place], [place], [address] Phone: [phone]   Fax:  [phone]  Name: JAKOBI THETFORD MRN: [id number] Date of Birth: 12/19/1934

## 2018-07-01 ENCOUNTER — Encounter: Payer: Self-pay | Admitting: Rehabilitative and Restorative Service Providers"

## 2018-07-01 ENCOUNTER — Other Ambulatory Visit: Payer: Self-pay

## 2018-07-01 ENCOUNTER — Ambulatory Visit: Payer: Medicare Other | Attending: Family Medicine | Admitting: Rehabilitative and Restorative Service Providers"

## 2018-07-01 DIAGNOSIS — R2681 Unsteadiness on feet: Secondary | ICD-10-CM | POA: Diagnosis not present

## 2018-07-01 DIAGNOSIS — H8111 Benign paroxysmal vertigo, right ear: Secondary | ICD-10-CM | POA: Insufficient documentation

## 2018-07-01 DIAGNOSIS — R2689 Other abnormalities of gait and mobility: Secondary | ICD-10-CM | POA: Diagnosis not present

## 2018-07-01 NOTE — Therapy (Signed)
[facility] [address] [place], [place], [address] Phone: [phone]   Fax:  [phone]  Physical Therapy Treatment  Patient Details  Name: Tyler Norris MRN: [id number] Date of Birth: 06/28/1934 Referring Provider (PT): [name], MD   Encounter Date: 07/01/2018  PT End of Session - 07/01/18 1114    Visit Number  2    Number of Visits  4    Date for PT Re-Evaluation  08/20/18    Authorization Type  Medicare    PT Start Time  1112    PT Stop Time  1150    PT Time Calculation (min)  38 min    Activity Tolerance  Patient tolerated treatment well    Behavior During Therapy  [facility] for tasks assessed/performed       Past Medical History:  Diagnosis Date  . AAA (abdominal aortic aneurysm) ([place])    a. resolved by ct scan 09-2011  . Anemia   . Arthritis   . Barrett's esophagus   . Carpal tunnel syndrome, bilateral   . Essential hypertension   . Fibromyalgia   . GERD (gastroesophageal reflux disease)   . History of pneumonia   . Hyperlipidemia   . Lupus (systemic lupus erythematosus) ([place])   . Nephrolithiasis   . Pericardial effusion    a. 08/22/2014 Echo: EF 60-65%, mildly dil Ao root (43mm), mildly dil LA, mod-large partially organized pericardial effusion with possible early tamponade - asymptomatic on high dose nsaids-->conservative mgmt;  b. 08/2014 Limited Echo: EF 55-60%, Triv - small effusion.  . Sleep apnea    a. uses cpap every night    Past Surgical History:  Procedure Laterality Date  . APPENDECTOMY    . CARPAL TUNNEL RELEASE  10/12/2011   Procedure: CARPAL TUNNEL RELEASE;  Surgeon: Cammie Sickle., MD;  Location: [facility];  Service: Orthopedics;  Laterality: Left;  . CARPAL TUNNEL RELEASE  12/01/2011   Procedure: CARPAL TUNNEL RELEASE;  Surgeon: Cammie Sickle., MD;  Location: [facility];  Service: Orthopedics;  Laterality: Right;  . CYSTOSCOPY W/ STONE  MANIPULATION  [id number]  . HAND SURGERY     scar repaired lt hand  . HERNIA REPAIR  1966  . KNEE ARTHROPLASTY    . KNEE ARTHROSCOPY Left   . TONSILLECTOMY      There were no vitals filed for this visit.  Subjective Assessment - 07/01/18 1114    Subjective  The patient reports that symptoms are better.  He may get an occasional feeling that it is going to come on.  The patient has moved 30 bags of topsoil over hte past week.  "My balance is not perfect."      Pertinent History  SDH in 2018, fibromyalgia, sleep apnea, lupus, hyperlipidemia, HTN.      Patient Stated Goals  Get rid of dizziness.      Currently in Pain?  No/denies             Vestibular Assessment - 07/01/18 1139      Positional Testing   Horizontal Canal Testing  Horizontal Canal Right;Horizontal Canal Left      Sidelying Right   Sidelying Right Duration  none    Sidelying Right Symptoms  No nystagmus      Sidelying Left   Sidelying Left Duration  none    Sidelying Left Symptoms  No nystagmus      Horizontal Canal Right   Horizontal Canal Right  Duration  10 seconds    Horizontal Canal Right Symptoms  Ageotrophic   indicating L horizontal cupulolithiasis     Horizontal Canal Left   Horizontal Canal Left Duration  none    Horizontal Canal Left Symptoms  Normal               [facility] Adult PT Treatment/Exercise - 07/01/18 1619      Neuro Re-ed    Neuro Re-ed Details   Balance activities added to HEP including single leg stance and tandem stance near support surface for safety.      Vestibular Treatment/Exercise - 07/01/18 1116      Vestibular Treatment/Exercise   Vestibular Treatment Provided  Habituation;Canalith Repositioning    Canalith Repositioning  Canal Roll Right    Habituation Exercises  Horizontal Roll      Canal Roll Right   Number of Reps   1   symptoms worse on R, treated L cupulolithiasis   Response Details   Performed horizontal repositioning for cupulolithiasis due to  apogeotropici nystagmus using Kim technique and vibration at 1st and 4th positions.       Horizontal Roll   Number of Reps   3    Symptom Description   patient has an apogeotropic nystagmus that is viewed in room light with horizontal rolling.              PT Education - 07/01/18 1617    Education Details  Added HEP    Person(s) Educated  Patient    Methods  Explanation;Demonstration;Handout    Comprehension  Verbalized understanding;Returned demonstration          PT Long Term Goals - 06/21/18 0801      PT LONG TERM GOAL #1   Title  The patient will be indep with HEP for habituation and high level balance, if indicated.    Baseline  *set goals for 8 weeks due to clinic changes b/c of COVID 19    Time  8    Period  Weeks    Target Date  08/20/18      PT LONG TERM GOAL #2   Title  The patient will have negative R sidelying and R horiozntal roll testing indicating resolution of BPPv.    Time  8    Period  Weeks    Target Date  08/20/18      PT LONG TERM GOAL #3   Title  The patient will be further assessed on balance and HEP provided as indicated.    Time  8    Period  Weeks    Target Date  08/20/18            Plan - 07/01/18 1619    Clinical Impression Statement  The patient continues with a mild apogeotropic nystagmus viewed in room light with R horizontal rolling.  Treated with canolith repositioning maneuver with continued trace nystagmus noted after performing.  PT recommended continuation of HEP for habituation and added balance activities for HEP.    PT Treatment/Interventions  ADLs/Self Care Home Management;Therapeutic activities;Therapeutic exercise;Balance training;Neuromuscular re-education;Patient/family education;Vestibular;Canalith Repostioning    PT Next Visit Plan  Check positional testing and treat as indicated, provide high level balance HEP after further assessment.    Consulted and Agree with Plan of Care  Patient       Patient will  benefit from skilled therapeutic intervention in order to improve the following deficits and impairments:     Visit Diagnosis: BPPV (benign paroxysmal positional vertigo),  right  Unsteadiness on feet  Other abnormalities of gait and mobility     Problem List Patient Active Problem List   Diagnosis Date Noted  . SDH (subdural hematoma) ([place]) 01/08/2017  . Pleural effusion on left 06/02/2015  . Aneurysm of thoracic aorta ([place]) 06/02/2015  . SOB (shortness of breath) 08/22/2014  . Pericardial effusion 08/22/2014  . Lupus (systemic lupus erythematosus) ([place])   . 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 PE (pulmonary embolism) 06/05/2012  . Pleural effusion exudative 05/26/2012  . Essential hypertension 05/13/2012  . OSA on CPAP 05/13/2012  . Cardiomegaly 05/13/2012  . Abdominal aneurysm without mention of rupture 06/27/2011    Janann Boeve, PT 07/01/2018, 4:22 PM  [facility] [address] [place], [place], [address] Phone: [phone]   Fax:  [phone]  Name: Tyler Norris MRN: [id number] Date of Birth: 08/25/1934

## 2018-07-01 NOTE — Patient Instructions (Signed)
Access Code: [email or web address]  URL: [email or web address]  Date: 07/01/2018  Prepared by: Rudell Cobb   Exercises Rolling right<>left sides for vestibular habituation - 5 reps - 1 sets                            - 3x daily - 7x weekly Single Leg Stance - 4 reps - 1 sets - 10 seconds hold - 1-2x daily - 7x weekly Tandem Stance with Support - 3 reps - 3 sets - 30 seonds hold - 1-2x daily - 7x weekly

## 2018-07-19 ENCOUNTER — Other Ambulatory Visit: Payer: Self-pay

## 2018-07-19 ENCOUNTER — Ambulatory Visit: Payer: Medicare Other | Admitting: Rehabilitative and Restorative Service Providers"

## 2018-07-19 ENCOUNTER — Encounter: Payer: Self-pay | Admitting: Rehabilitative and Restorative Service Providers"

## 2018-07-19 DIAGNOSIS — R2689 Other abnormalities of gait and mobility: Secondary | ICD-10-CM

## 2018-07-19 DIAGNOSIS — R2681 Unsteadiness on feet: Secondary | ICD-10-CM | POA: Diagnosis not present

## 2018-07-19 DIAGNOSIS — H8111 Benign paroxysmal vertigo, right ear: Secondary | ICD-10-CM | POA: Diagnosis not present

## 2018-07-19 NOTE — Therapy (Signed)
[facility] [address] [place], [place], [address] Phone: [phone]   Fax:  [phone]  Physical Therapy Treatment and Discharge Summary  Patient Details  Name: Tyler Norris MRN: [id number] Date of Birth: 06/30/1934 Referring Provider (PT): [name], MD   Encounter Date: 07/19/2018  PT End of Session - 07/19/18 1412    Visit Number  3    Number of Visits  4    Date for PT Re-Evaluation  08/20/18    Authorization Type  Medicare    PT Start Time  1410    PT Stop Time  1448    PT Time Calculation (min)  38 min    Activity Tolerance  Patient tolerated treatment well    Behavior During Therapy  [facility] for tasks assessed/performed       Past Medical History:  Diagnosis Date  . AAA (abdominal aortic aneurysm) ([place])    a. resolved by ct scan 09-2011  . Anemia   . Arthritis   . Barrett's esophagus   . Carpal tunnel syndrome, bilateral   . Essential hypertension   . Fibromyalgia   . GERD (gastroesophageal reflux disease)   . History of pneumonia   . Hyperlipidemia   . Lupus (systemic lupus erythematosus) ([place])   . Nephrolithiasis   . Pericardial effusion    a. 08/22/2014 Echo: EF 60-65%, mildly dil Ao root (7m), mildly dil LA, mod-large partially organized pericardial effusion with possible early tamponade - asymptomatic on high dose nsaids-->conservative mgmt;  b. 08/2014 Limited Echo: EF 55-60%, Triv - small effusion.  . Sleep apnea    a. uses cpap every night    Past Surgical History:  Procedure Laterality Date  . APPENDECTOMY    . CARPAL TUNNEL RELEASE  10/12/2011   Procedure: CARPAL TUNNEL RELEASE;  Surgeon: RCammie Sickle, MD;  Location: [facility]  Service: Orthopedics;  Laterality: Left;  . CARPAL TUNNEL RELEASE  12/01/2011   Procedure: CARPAL TUNNEL RELEASE;  Surgeon: RCammie Sickle, MD;  Location: [facility]  Service: Orthopedics;  Laterality: Right;   . CYSTOSCOPY W/ STONE MANIPULATION  [id number] . HAND SURGERY     scar repaired lt hand  . HERNIA REPAIR  1966  . KNEE ARTHROPLASTY    . KNEE ARTHROSCOPY Left   . TONSILLECTOMY      There were no vitals filed for this visit.  Subjective Assessment - 07/19/18 1410    Subjective  The patient notes he has not been doing his exercises because he is working on his fence.  He reports he has been crawling and digging to dig fence.   He denies dizziness when getting into bed, however does have episodes when sitting up and twisting to the right, he can get occasional episodes.  His balance still feels off, however he is not fitting balance exercises in well.     Pertinent History  SDH in 2018, fibromyalgia, sleep apnea, lupus, hyperlipidemia, HTN.      Patient Stated Goals  Get rid of dizziness.      Currently in Pain?  No/denies       CLINIC OPERATION CHANGES: [facility] is operating at a low capacity due to COVID-19.  The patient was brought into the clinic for evaluation and/or treatment following universal masking, social distancing, and <10 people in the clinic.  The patient's COVID risk of complications score is 6.       Vestibular  Assessment - 07/19/18 1412      Vestibular Assessment   General Observation  The patient walks into clinic independently.      Positional Testing   Sidelying Test  Sidelying Right;Sidelying Left    Horizontal Canal Testing  Horizontal Canal Right;Horizontal Canal Left      Sidelying Right   Sidelying Right Duration  none    Sidelying Right Symptoms  No nystagmus      Sidelying Left   Sidelying Left Duration  none    Sidelying Left Symptoms  No nystagmus      Horizontal Canal Right   Horizontal Canal Right Duration  none    Horizontal Canal Right Symptoms  Normal      Horizontal Canal Left   Horizontal Canal Left Duration  none    Horizontal Canal Left Symptoms  Normal               [facility] Adult PT  Treatment/Exercise - 07/19/18 1438      Ambulation/Gait   Ambulation/Gait  Yes    Ambulation/Gait Assistance  7: Independent    Ambulation Distance (Feet)  800 Feet    Assistive device  None    Gait Comments  The patient ambulated outdoors on grass, rubber mulch, pine straw, stepped up and down curbs and negotiated inclines/declines without loss of balance.      Neuro Re-ed    Neuro Re-ed Details   Reviewed prior HEP of single leg stance, tandem stance and recommended continuation of HEP to challenge his balance.  Patient trialed gaze x 1 adaptation exercise without difficulty.  He did note some stiffness in neck and PT/ patient discussed benefit of gentle ROM to maintain mobility in his cervical spine.                  PT Long Term Goals - 07/19/18 1416      PT LONG TERM GOAL #1   Title  The patient will be indep with HEP for habituation and high level balance, if indicated.    Baseline  *set goals for 8 weeks due to clinic changes b/c of COVID 19    Time  8    Period  Weeks    Status  Achieved      PT LONG TERM GOAL #2   Title  The patient will have negative R sidelying and R horiozntal roll testing indicating resolution of BPPv.    Time  8    Period  Weeks    Status  Achieved      PT LONG TERM GOAL #3   Title  The patient will be further assessed on balance and HEP provided as indicated.    Time  8    Period  Weeks    Status  Achieved            Plan - 07/19/18 1615    Clinical Impression Statement  The patient met 3/3 LTGs and has HEP for continued balance challenge in the home. Positional vertigo resolved during testing today and per subjective reports with daily tasks.     PT Treatment/Interventions  ADLs/Self Care Home Management;Therapeutic activities;Therapeutic exercise;Balance training;Neuromuscular re-education;Patient/family education;Vestibular;Canalith Repostioning    PT Next Visit Plan  discharge.     Consulted and Agree with Plan of Care   Patient       Patient will benefit from skilled therapeutic intervention in order to improve the following deficits and impairments:  Dizziness, Abnormal gait, Decreased balance  Visit Diagnosis: Unsteadiness  on feet  Other abnormalities of gait and mobility  PHYSICAL THERAPY DISCHARGE SUMMARY  Visits from Start of Care: 3  Current functional level related to goals / functional outcomes: See above   Remaining deficits: dec'd high level balance   Education / Equipment: HEP   Plan: Patient agrees to discharge.  Patient goals were met. Patient is being discharged due to meeting the stated rehab goals.  ?????         Thank you for the referral of this patient. Rudell Cobb, MPT   Dorene Bruni, PT 07/19/2018, 4:16 PM  [facility] [address] [place], [place], [address] Phone: [phone]   Fax:  [phone]  Name: Tyler Norris MRN: [id number] Date of Birth: [birth year]

## 2018-08-09 DIAGNOSIS — K640 First degree hemorrhoids: Secondary | ICD-10-CM | POA: Diagnosis not present

## 2018-08-09 DIAGNOSIS — K5730 Diverticulosis of large intestine without perforation or abscess without bleeding: Secondary | ICD-10-CM | POA: Diagnosis not present

## 2018-08-09 DIAGNOSIS — Z86010 Personal history of colonic polyps: Secondary | ICD-10-CM | POA: Diagnosis not present

## 2018-08-09 DIAGNOSIS — D126 Benign neoplasm of colon, unspecified: Secondary | ICD-10-CM | POA: Diagnosis not present

## 2018-08-13 DIAGNOSIS — D126 Benign neoplasm of colon, unspecified: Secondary | ICD-10-CM | POA: Diagnosis not present

## 2018-08-21 ENCOUNTER — Telehealth: Payer: Self-pay | Admitting: Rheumatology

## 2018-08-21 NOTE — Telephone Encounter (Signed)
Patient left a voicemail requesting a return call to discuss his labs that were ordered through [facility].

## 2018-08-21 NOTE — Telephone Encounter (Signed)
Patient states he received a bill from [facility] for Vitamin D test was not covered by medicare. Patient states that he wanted to make sure all of the proper codes were submitted to the lab before paying for the bill. Patient states the lab is going to submit the bill to [employer] as well.

## 2018-08-26 ENCOUNTER — Telehealth: Payer: Self-pay | Admitting: Rheumatology

## 2018-08-26 NOTE — Telephone Encounter (Signed)
Patient left a voicemail stating he has a couple of questions regarding a bill he received.  Patient is requesting a return call to discuss this issue.

## 2018-08-27 NOTE — Telephone Encounter (Signed)
Patient states he has received a invoice date of 08/13/18 from [facility] order by Hazel Sams, PA-C. Claim Filed with Medicare. Patient states the Vitamin D was note covered by Medicare. Patient states on the invoice it states the test it is not medically necessary. Patient advised that there are no other codes that can be submitted. Patient advised that he will have to contact Medicare as well as [employer].

## 2018-09-09 DIAGNOSIS — K219 Gastro-esophageal reflux disease without esophagitis: Secondary | ICD-10-CM | POA: Diagnosis not present

## 2018-09-09 DIAGNOSIS — R809 Proteinuria, unspecified: Secondary | ICD-10-CM | POA: Diagnosis not present

## 2018-09-09 DIAGNOSIS — N182 Chronic kidney disease, stage 2 (mild): Secondary | ICD-10-CM | POA: Diagnosis not present

## 2018-09-09 DIAGNOSIS — E785 Hyperlipidemia, unspecified: Secondary | ICD-10-CM | POA: Diagnosis not present

## 2018-09-09 DIAGNOSIS — I129 Hypertensive chronic kidney disease with stage 1 through stage 4 chronic kidney disease, or unspecified chronic kidney disease: Secondary | ICD-10-CM | POA: Diagnosis not present

## 2018-09-09 DIAGNOSIS — M329 Systemic lupus erythematosus, unspecified: Secondary | ICD-10-CM | POA: Diagnosis not present

## 2018-09-09 DIAGNOSIS — I714 Abdominal aortic aneurysm, without rupture: Secondary | ICD-10-CM | POA: Diagnosis not present

## 2018-10-02 DIAGNOSIS — E119 Type 2 diabetes mellitus without complications: Secondary | ICD-10-CM | POA: Diagnosis not present

## 2018-10-02 DIAGNOSIS — M159 Polyosteoarthritis, unspecified: Secondary | ICD-10-CM | POA: Diagnosis not present

## 2018-10-02 DIAGNOSIS — I2510 Atherosclerotic heart disease of native coronary artery without angina pectoris: Secondary | ICD-10-CM | POA: Diagnosis not present

## 2018-10-02 DIAGNOSIS — Z125 Encounter for screening for malignant neoplasm of prostate: Secondary | ICD-10-CM | POA: Diagnosis not present

## 2018-10-02 DIAGNOSIS — Z0000 Encounter for general adult medical examination without abnormal findings: Secondary | ICD-10-CM | POA: Diagnosis not present

## 2018-10-02 DIAGNOSIS — I7789 Other specified disorders of arteries and arterioles: Secondary | ICD-10-CM | POA: Diagnosis not present

## 2018-10-02 DIAGNOSIS — K219 Gastro-esophageal reflux disease without esophagitis: Secondary | ICD-10-CM | POA: Diagnosis not present

## 2018-10-02 DIAGNOSIS — N182 Chronic kidney disease, stage 2 (mild): Secondary | ICD-10-CM | POA: Diagnosis not present

## 2018-10-02 DIAGNOSIS — E782 Mixed hyperlipidemia: Secondary | ICD-10-CM | POA: Diagnosis not present

## 2018-10-02 DIAGNOSIS — M3210 Systemic lupus erythematosus, organ or system involvement unspecified: Secondary | ICD-10-CM | POA: Diagnosis not present

## 2018-10-02 DIAGNOSIS — I10 Essential (primary) hypertension: Secondary | ICD-10-CM | POA: Diagnosis not present

## 2018-10-07 ENCOUNTER — Other Ambulatory Visit: Payer: Self-pay | Admitting: Family Medicine

## 2018-10-07 DIAGNOSIS — I7789 Other specified disorders of arteries and arterioles: Secondary | ICD-10-CM

## 2018-10-07 DIAGNOSIS — I129 Hypertensive chronic kidney disease with stage 1 through stage 4 chronic kidney disease, or unspecified chronic kidney disease: Secondary | ICD-10-CM | POA: Diagnosis not present

## 2018-10-07 DIAGNOSIS — N182 Chronic kidney disease, stage 2 (mild): Secondary | ICD-10-CM | POA: Diagnosis not present

## 2018-10-08 NOTE — Progress Notes (Signed)
Office Visit Note  Patient: Tyler Norris             Date of Birth: 1934-12-25           MRN: [id number]             PCP: [name], MD Referring: [name], MD Visit Date: 10/21/2018 Occupation: @GUAROCC @  Subjective:  Medication monitoring   History of Present Illness: Tyler Norris is a 84 y.o. male with history of systemic lupus erythematosus.  He is taking Plaquenil 200 mg 1 tablet by mouth twice daily.  He denies any recent lupus flares.  He denies any joint pain or joint swelling at this time.  He denies any recent rashes or photosensitivity.  He denies any sores in his mouth or nose.  He has not had any sicca symptoms.  He denies any symptoms of Raynaud's.  He denies any palpitations or shortness of breath.  He is not experiencing any worsening fatigue, fevers, or swollen lymph nodes.    Activities of Daily Living:  Patient reports morning stiffness for 0 minutes.   Patient Denies nocturnal pain.  Difficulty dressing/grooming: Denies Difficulty climbing stairs: Denies Difficulty getting out of chair: Denies Difficulty using hands for taps, buttons, cutlery, and/or writing: Denies  Review of Systems  Constitutional: Negative for fatigue and night sweats.  HENT: Negative for mouth sores, mouth dryness and nose dryness.   Eyes: Negative for redness, itching and dryness.  Respiratory: Negative for cough, hemoptysis, shortness of breath, wheezing, apnea and difficulty breathing.   Cardiovascular: Negative for chest pain, palpitations, hypertension, irregular heartbeat and swelling in legs/feet.  Gastrointestinal: Negative for abdominal pain, blood in stool, constipation and diarrhea.  Endocrine: Negative for increased urination.  Genitourinary: Negative for difficulty urinating and painful urination.  Musculoskeletal: Negative for arthralgias, joint pain, joint swelling, myalgias, muscle weakness, morning stiffness, muscle tenderness and myalgias.  Skin:  Negative for color change, rash, hair loss, nodules/bumps, redness, skin tightness, ulcers and sensitivity to sunlight.  Allergic/Immunologic: Negative for susceptible to infections.  Neurological: Negative for dizziness, fainting, headaches, memory loss, night sweats and weakness.  Hematological: Negative for bruising/bleeding tendency and swollen glands.  Psychiatric/Behavioral: Negative for depressed mood, confusion and sleep disturbance. The patient is not nervous/anxious.     PMFS History:  Patient Active Problem List   Diagnosis Date Noted   SDH (subdural hematoma) ([place]) 01/08/2017   Pleural effusion on left 06/02/2015   Aneurysm of thoracic aorta (HCC) 06/02/2015   SOB (shortness of breath) 08/22/2014   Pericardial effusion 08/22/2014   Lupus (systemic lupus erythematosus) (HCC)    Positive QuantiFERON-TB Gold test 07/05/2012   PE (pulmonary embolism) 06/05/2012   Pleural effusion exudative 05/26/2012   Essential hypertension 05/13/2012   OSA on CPAP 05/13/2012   Cardiomegaly 05/13/2012   Abdominal aneurysm without mention of rupture 06/27/2011    Past Medical History:  Diagnosis Date   AAA (abdominal aortic aneurysm) ([place])    a. resolved by ct scan 09-2011   Anemia    Arthritis    Barrett's esophagus    Carpal tunnel syndrome, bilateral    Essential hypertension    Fibromyalgia    GERD (gastroesophageal reflux disease)    History of pneumonia    Hyperlipidemia    Lupus (systemic lupus erythematosus) (HCC)    Nephrolithiasis    Pericardial effusion    a. 08/22/2014 Echo: EF 60-65%, mildly dil Ao root (27mm), mildly dil LA, mod-large partially organized pericardial effusion with possible early  tamponade - asymptomatic on high dose nsaids-->conservative mgmt;  b. 08/2014 Limited Echo: EF 55-60%, Triv - small effusion.   Sleep apnea    a. uses cpap every night    Family History  Problem Relation Age of Onset   Diabetes Mother    Diabetes  Sister    Stroke Sister    Healthy Daughter    Healthy Son    Past Surgical History:  Procedure Laterality Date   APPENDECTOMY     CARPAL TUNNEL RELEASE  10/12/2011   Procedure: CARPAL TUNNEL RELEASE;  Surgeon: Cammie Sickle., MD;  Location: [facility];  Service: Orthopedics;  Laterality: Left;   CARPAL TUNNEL RELEASE  12/01/2011   Procedure: CARPAL TUNNEL RELEASE;  Surgeon: Cammie Sickle., MD;  Location: [place];  Service: Orthopedics;  Laterality: Right;   CYSTOSCOPY W/ STONE MANIPULATION  2001,2009   HAND SURGERY     scar repaired lt hand   HERNIA REPAIR  1966   KNEE ARTHROPLASTY     KNEE ARTHROSCOPY Left    TONSILLECTOMY     Social History   Social History Narrative   Not on file   Immunization History  Administered Date(s) Administered   Influenza Nasal 11/28/2011   Influenza Split 11/27/2013, 11/28/2014   Influenza,inj,Quad PF,6+ Mos 11/11/2012, 10/29/2015   Influenza-Unspecified 11/16/2014   Pneumococcal Polysaccharide-23 05/15/2012   Tdap 01/07/2017     Objective: Vital Signs: BP (!) 141/72 (BP Location: Left Arm, Patient Position: Sitting, Cuff Size: Normal)    Pulse (!) 53    Resp 15    Ht 5\' 9"  (1.753 m)    Wt 211 lb 3.2 oz (95.8 kg)    BMI 31.19 kg/m    Physical Exam Vitals signs and nursing note reviewed.  Constitutional:      Appearance: He is well-developed.  HENT:     Head: Normocephalic and atraumatic.  Eyes:     Conjunctiva/sclera: Conjunctivae normal.     Pupils: Pupils are equal, round, and reactive to light.  Neck:     Musculoskeletal: Normal range of motion and neck supple.  Cardiovascular:     Rate and Rhythm: Normal rate and regular rhythm.     Heart sounds: Normal heart sounds.  Pulmonary:     Effort: Pulmonary effort is normal.     Breath sounds: Normal breath sounds.  Abdominal:     General: Bowel sounds are normal.     Palpations: Abdomen is soft.  Skin:    General: Skin  is warm and dry.     Capillary Refill: Capillary refill takes less than 2 seconds.  Neurological:     Mental Status: He is alert and oriented to person, place, and time.  Psychiatric:        Behavior: Behavior normal.    Musculoskeletal Exam: C-spine limited range of motion.  Thoracic kyphosis noted.  He has no midline spinal tenderness.  No SI joint tenderness.  Shoulder joints have good range of motion with forward flexion and abduction.  She has limited internal rotation of both shoulder joints.  Bilateral elbow joint contractures.  Limited range of motion of bilateral wrist joints.  No tenderness or synovitis of the wrist joints noted.  He has synovial thickening of bilateral first, second, and third MCP joints.  He has PIP and DIP synovial thickening consistent with osteoarthritis of bilateral hands.  He has incomplete fist formation bilaterally.  Hip joints have limited range of motion.  Knee joints  have good range of motion with no warmth or effusion.  No tenderness or swelling of ankle joints.  He does have pedal edema bilaterally.  Hammertoe of the right second toe noted.  CDAI Exam: CDAI Score: -- Patient Global: --; Provider Global: -- Swollen: --; Tender: -- Joint Exam   No joint exam has been documented for this visit   There is currently no information documented on the homunculus. Go to the Rheumatology activity and complete the homunculus joint exam.  Investigation: No additional findings.  Imaging: Ct Angio Chest Aorta W/cm &/or Wo/cm  Result Date: 10/16/2018 CLINICAL DATA:  Evaluate ascending thoracic aortic enlargement. EXAM: CT ANGIOGRAPHY CHEST WITH CONTRAST TECHNIQUE: Multidetector CT imaging of the chest was performed using the standard protocol during bolus administration of intravenous contrast. Multiplanar CT image reconstructions and MIPs were obtained to evaluate the vascular anatomy. CONTRAST:  7mL ISOVUE-370 IOPAMIDOL (ISOVUE-370) INJECTION 76% COMPARISON:   05/28/2015 FINDINGS: Cardiovascular: Normal caliber of the ascending thoracic aorta. Aortic root measures roughly 3.7 cm. Maximum dimension of the ascending thoracic aorta is 3.7 cm. No evidence for an aneurysm or dissection. Atherosclerotic calcifications involving the thoracic aorta. Great vessels are patent. Descending thoracic aorta measures 2.8 cm. Main pulmonary arteries are patent without filling defects. There are coronary artery calcifications particularly involving the LAD. Celiac trunk is patent. Origin of the SMA is patent. Mediastinum/Nodes: 1.2 cm low-density nodule in the right thyroid lobe. Small mediastinal lymph nodes. No significant lymph node enlargement. No axillary lymph node enlargement. Lungs/Pleura: Trachea and mainstem bronchi are patent. Left pleural effusion has essentially resolved. There is minimal scarring or atelectasis along the posterior left pleural surface. No significant pleural fluid. Stable scarring along the posterior right lower lobe. Small adjacent nodular structures in left lower lobe along the left major fissure on sequence 6 image 69. Nodules measure roughly 4 mm in size. This area is poorly characterized on the previous examination but suspect there has been minimal change. No significant airspace disease or consolidation in the lungs. Upper Abdomen: Small hypervascular or dense structure in the left hepatic lobe on sequence 4, image 147 measures roughly 1 cm. Additional small hypervascular focus in the right hepatic lobe on image 125. These small hypervascular or hyperdense areas in liver are likely incidental findings. Small calcification or hyperdensity in the medial aspect of the spleen is likely an incidental finding. Musculoskeletal: Degenerative disc disease in lower cervical spine. No acute bone abnormality. Review of the MIP images confirms the above findings. IMPRESSION: 1. Normal caliber of the thoracic aorta. Negative for a thoracic aortic aneurysm. Negative  for aortic dissection. 2. Coronary artery calcifications. 3.  Aortic Atherosclerosis (ICD10-I70.0). 4. Left pleural fluid has resolved from the prior CT in 2017. Scarring at the lung bases. 5. Small pulmonary nodules, largest measuring 4 mm and these may be stable but indeterminate. No follow-up needed if patient is low-risk (and has no known or suspected primary neoplasm). Non-contrast chest CT can be considered in 12 months if patient is high-risk. This recommendation follows the consensus statement: Guidelines for Management of Incidental Pulmonary Nodules Detected on CT Images: From the Fleischner Society 2017; Radiology 2017; 284:228-243. Electronically Signed   By: Markus Daft M.D.   On: 10/16/2018 14:58    Recent Labs: Lab Results  Component Value Date   WBC 5.4 05/20/2018   HGB 13.9 05/20/2018   PLT 191 05/20/2018   NA 143 05/20/2018   K 4.5 05/20/2018   CL 106 05/20/2018   CO2 29  05/20/2018   GLUCOSE 141 (H) 05/20/2018   BUN 22 05/20/2018   CREATININE 0.93 05/20/2018   BILITOT 0.6 05/20/2018   ALKPHOS 43 08/22/2014   AST 14 05/20/2018   ALT 11 05/20/2018   PROT 6.5 05/20/2018   ALBUMIN 3.3 (L) 08/22/2014   CALCIUM 9.8 05/20/2018   GFRAA 89 05/20/2018    Speciality Comments: PLQ Eye Exam: 12/31/17 WNL @ Shon Hough, MD Follow up in 1 year.  Procedures:  No procedures performed Allergies: Atorvastatin and Lipitor [atorvastatin calcium]   Assessment / Plan:     Visit Diagnoses: Other systemic lupus erythematosus with other organ involvement (HCC) - +ANA, +RF: He has not had any signs or symptoms of a lupus flare recently.  He is clinically doing well on Plaquenil 200 mg 1 tablet by mouth twice daily.  He has no joint pain or joint synovitis at this time.  He has no morning stiffness.  He has not had any recent rashes or photosensitivity.  He denies any symptoms of Raynaud's and no digital ulcerations or signs of gangrene were noted.  He has not had any oral or nasal  ulcerations.  No sicca symptoms.  He has not had any recent fever, fatigue, or swollen lymph nodes.  He will continue taking Plaquenil as prescribed.  He does not need any refills at this time.  We will check the following lab work today.  He was advised to notify us if he develops new or worsening symptoms.  He will follow-up in the office in 5 months.- Plan: CBC with Differential/Platelet, Urinalysis, Routine w reflex microscopic, Anti-DNA antibody, double-stranded, C3 and C4, Sedimentation rate  High risk medication use - Plaquenil 200 mg twice daily.Eye Exam: 12/31/17.  CMP was drawn on 10/07/2018.  CBC will be drawn today.- Plan: CBC with Differential/Platelet  Other proteinuria - Patient was evaluated by nephrology in the past.  UA ordered today.  - Plan: Urinalysis, Routine w reflex microscopic  Other medical conditions are listed as follows:   Positive QuantiFERON-TB Gold test  History of pulmonary embolism  History of gastroesophageal reflux (GERD)  History of pleural effusion - in the past.  History of sleep apnea  Orders: Orders Placed This Encounter  Procedures   CBC with Differential/Platelet   COMPLETE METABOLIC PANEL WITH GFR   Urinalysis, Routine w reflex microscopic   Anti-DNA antibody, double-stranded   C3 and C4   Sedimentation rate   No orders of the defined types were placed in this encounter.     Follow-Up Instructions: Return in about 5 months (around 03/23/2019) for Systemic lupus erythematosus.   Ofilia Neas, PA-C  Note - This record has been created using Dragon software.  Chart creation errors have been sought, but may not always  have been located. Such creation errors do not reflect on  the standard of medical care.

## 2018-10-16 ENCOUNTER — Ambulatory Visit
Admission: RE | Admit: 2018-10-16 | Discharge: 2018-10-16 | Disposition: A | Discharge status: Home / Self Care | Payer: Medicare Other | Source: Ambulatory Visit | Attending: Family Medicine | Admitting: Family Medicine

## 2018-10-16 DIAGNOSIS — I7789 Other specified disorders of arteries and arterioles: Secondary | ICD-10-CM

## 2018-10-16 MED ORDER — IOPAMIDOL (ISOVUE-370) INJECTION 76%
75.0000 mL | Freq: Once | INTRAVENOUS | Status: AC | PRN
  Administered 2018-10-16: 75 mL via INTRAVENOUS

## 2018-10-21 ENCOUNTER — Encounter: Payer: Self-pay | Admitting: Rheumatology

## 2018-10-21 ENCOUNTER — Ambulatory Visit (INDEPENDENT_AMBULATORY_CARE_PROVIDER_SITE_OTHER): Payer: Medicare Other | Admitting: Physician Assistant

## 2018-10-21 ENCOUNTER — Other Ambulatory Visit: Payer: Self-pay

## 2018-10-21 VITALS — BP 141/72 | HR 53 | Resp 15 | Ht 69.0 in | Wt 211.2 lb

## 2018-10-21 DIAGNOSIS — R7612 Nonspecific reaction to cell mediated immunity measurement of gamma interferon antigen response without active tuberculosis: Secondary | ICD-10-CM

## 2018-10-21 DIAGNOSIS — Z8709 Personal history of other diseases of the respiratory system: Secondary | ICD-10-CM | POA: Diagnosis not present

## 2018-10-21 DIAGNOSIS — M3219 Other organ or system involvement in systemic lupus erythematosus: Secondary | ICD-10-CM

## 2018-10-21 DIAGNOSIS — R808 Other proteinuria: Secondary | ICD-10-CM | POA: Diagnosis not present

## 2018-10-21 DIAGNOSIS — Z79899 Other long term (current) drug therapy: Secondary | ICD-10-CM

## 2018-10-21 DIAGNOSIS — Z86711 Personal history of pulmonary embolism: Secondary | ICD-10-CM | POA: Diagnosis not present

## 2018-10-21 DIAGNOSIS — Z8719 Personal history of other diseases of the digestive system: Secondary | ICD-10-CM

## 2018-10-21 DIAGNOSIS — Z8669 Personal history of other diseases of the nervous system and sense organs: Secondary | ICD-10-CM

## 2018-10-22 ENCOUNTER — Telehealth: Payer: Self-pay | Admitting: Rheumatology

## 2018-10-22 LAB — CBC WITH DIFFERENTIAL/PLATELET
Absolute Monocytes: 684 cells/uL (ref 200–950)
Basophils Absolute: 53 cells/uL (ref 0–200)
Basophils Relative: 0.9 %
Eosinophils Absolute: 153 cells/uL (ref 15–500)
Eosinophils Relative: 2.6 %
HCT: 38.4 % — ABNORMAL LOW (ref 38.5–50.0)
Hemoglobin: 13.2 g/dL (ref 13.2–17.1)
Lymphs Abs: 1050 cells/uL (ref 850–3900)
MCH: 31.6 pg (ref 27.0–33.0)
MCHC: 34.4 g/dL (ref 32.0–36.0)
MCV: 91.9 fL (ref 80.0–100.0)
MPV: 10.3 fL (ref 7.5–12.5)
Monocytes Relative: 11.6 %
Neutro Abs: 3959 cells/uL (ref 1500–7800)
Neutrophils Relative %: 67.1 %
Platelets: 179 10*3/uL (ref 140–400)
RBC: 4.18 10*6/uL — ABNORMAL LOW (ref 4.20–5.80)
RDW: 11.8 % (ref 11.0–15.0)
Total Lymphocyte: 17.8 %
WBC: 5.9 10*3/uL (ref 3.8–10.8)

## 2018-10-22 LAB — URINALYSIS, ROUTINE W REFLEX MICROSCOPIC
Bacteria, UA: NONE SEEN /HPF
Bilirubin Urine: NEGATIVE
Glucose, UA: NEGATIVE
Hgb urine dipstick: NEGATIVE
Hyaline Cast: NONE SEEN /LPF
Ketones, ur: NEGATIVE
Nitrite: NEGATIVE
Protein, ur: NEGATIVE
Specific Gravity, Urine: 1.02 (ref 1.001–1.03)
Squamous Epithelial / LPF: NONE SEEN /HPF (ref <=5)
pH: 5.5 (ref 5.0–8.0)

## 2018-10-22 LAB — COMPLETE METABOLIC PANEL WITH GFR
AG Ratio: 2.3 (calc) (ref 1.0–2.5)
ALT: 10 U/L (ref 9–46)
AST: 15 U/L (ref 10–35)
Albumin: 4.5 g/dL (ref 3.6–5.1)
Alkaline phosphatase (APISO): 33 U/L — ABNORMAL LOW (ref 35–144)
BUN/Creatinine Ratio: 27 (calc) — ABNORMAL HIGH (ref 6–22)
BUN: 27 mg/dL — ABNORMAL HIGH (ref 7–25)
CO2: 30 mmol/L (ref 20–32)
Calcium: 9.8 mg/dL (ref 8.6–10.3)
Chloride: 105 mmol/L (ref 98–110)
Creat: 0.99 mg/dL (ref 0.70–1.11)
GFR, Est African American: 82 mL/min/{1.73_m2} (ref >=60)
GFR, Est Non African American: 71 mL/min/{1.73_m2} (ref >=60)
Globulin: 2 g/dL (calc) (ref 1.9–3.7)
Glucose, Bld: 145 mg/dL — ABNORMAL HIGH (ref 65–99)
Potassium: 4.5 mmol/L (ref 3.5–5.3)
Sodium: 142 mmol/L (ref 135–146)
Total Bilirubin: 0.7 mg/dL (ref 0.2–1.2)
Total Protein: 6.5 g/dL (ref 6.1–8.1)

## 2018-10-22 LAB — SEDIMENTATION RATE: Sed Rate: 2 mm/h (ref 0–20)

## 2018-10-22 LAB — C3 AND C4
C3 Complement: 128 mg/dL
C4 Complement: 20 mg/dL

## 2018-10-22 LAB — ANTI-DNA ANTIBODY, DOUBLE-STRANDED: ds DNA Ab: 6 IU/mL — ABNORMAL HIGH

## 2018-10-22 NOTE — Telephone Encounter (Signed)
Patient has spoke with Medicare about bill with [facility]. Medicare does not cover Vit D. 3 without the correct diagnosis. Patient needs a statement from doctor stating order was medically necessary. Please call patient to advise. (Patient left information on bill with Olivia Mackie to look at.)

## 2018-10-22 NOTE — Progress Notes (Signed)
RBC count and Hct are borderline low.  Rest of CBC WNL.  UA revealed trace leukocytes-please notify patient and if he is symptomatic advise him to follow up with PCP.  DsDNA is elevated but trending down.  Complements WNL.  Sed rate WNL.    Glucose is elevated-145.  Alk phos is borderline low.  BUN elevated.

## 2018-10-24 ENCOUNTER — Encounter: Payer: Self-pay | Admitting: *Deleted

## 2018-10-24 NOTE — Telephone Encounter (Signed)
Patient advised we will be writing a letter to Medicare to advised why test is medically necessary on his behalf.

## 2018-10-29 ENCOUNTER — Encounter: Payer: Self-pay | Admitting: *Deleted

## 2018-12-16 DIAGNOSIS — Z23 Encounter for immunization: Secondary | ICD-10-CM | POA: Diagnosis not present

## 2018-12-30 ENCOUNTER — Other Ambulatory Visit: Payer: Self-pay | Admitting: Physician Assistant

## 2018-12-31 NOTE — Telephone Encounter (Signed)
Last Visit: 10/22/18 Next Visit: 03/24/19 Labs: 10/21/18 RBC count and Hct are borderline low. Rest of CBC WNL. Glucose is elevated-145. Alk phos is borderline low. BUN elevated.  PLQ Eye Exam: 12/31/17 WNL  Okay to refill per Dr. Estanislado Pandy

## 2019-01-15 ENCOUNTER — Telehealth: Payer: Self-pay | Admitting: Rheumatology

## 2019-01-15 NOTE — Telephone Encounter (Signed)
Patient left a voicemail requesting a return call to discuss his bill that was appealed through [employer].

## 2019-01-15 NOTE — Telephone Encounter (Signed)
Patient states he has received a bill from [facility] for the Vitamin D. Patient states he has paid the bill. Patient states he has sent the appeal with the appeal letter and it was denied. Patient states he is going to file a second appeal. Patient advised we have provided the letter that he will need. Patient states he would like to come by the office to discuss the documents he has received. Patient will come by to discuss.

## 2019-01-20 ENCOUNTER — Telehealth: Payer: Self-pay | Admitting: Rheumatology

## 2019-01-20 NOTE — Telephone Encounter (Signed)
Patient called stating he has spent approximately 6 hours on the phone with [facility] and Medicare regarding his appeal for the Vitamin D test.  Patient states Medicare told him they never received the letter from Dr. Estanislado Pandy stating that the Vitamin D test was medically necessary and are requesting his medical records.  Patient is requesting a return call.

## 2019-01-21 ENCOUNTER — Telehealth: Payer: Self-pay | Admitting: Rheumatology

## 2019-01-21 NOTE — Telephone Encounter (Signed)
Advised patient Tyler Norris and Olivia Mackie have been working on this issue for him and both are out of the office due to the office being closed. I advised patient either Tyler Norris of myself would call him next week.   I will have to discuss this issue with Tyler Norris because I am unsure as to everything that has been discussed.

## 2019-01-21 NOTE — Telephone Encounter (Signed)
See telephone encounter from 01/21/2019.

## 2019-01-21 NOTE — Telephone Encounter (Signed)
Patient called stating Dr. Estanislado Pandy gave him literature to include with the letter she wrote on 10/29/2018.  Patient states he did not keep a copy and is requesting another copy.  Patient states the article was "The Role of Vitamin D on Systemic Lupus."

## 2019-01-30 NOTE — Telephone Encounter (Signed)
Tyler Norris had an envelope addressed to patient containing several documents regarding vitamin D level.  Attempted to contact patient and left message on machine advising patient it would be mailed to him.

## 2019-02-27 ENCOUNTER — Encounter: Payer: Self-pay | Admitting: Physician Assistant

## 2019-03-12 ENCOUNTER — Telehealth: Payer: Self-pay | Admitting: Pulmonary Disease

## 2019-03-12 NOTE — Telephone Encounter (Signed)
Spoke with the pt  He states having issues with CPAP  He states he feels like he is not getting enough air and is not sleeping well  He was last seen here in 2019  OV with Dr Elsworth Soho was scheduled for 03/12/2018  Covid screen neg and I have given him the new office location

## 2019-03-13 ENCOUNTER — Ambulatory Visit (INDEPENDENT_AMBULATORY_CARE_PROVIDER_SITE_OTHER): Payer: Medicare Other | Admitting: Pulmonary Disease

## 2019-03-13 ENCOUNTER — Encounter: Payer: Self-pay | Admitting: Pulmonary Disease

## 2019-03-13 ENCOUNTER — Other Ambulatory Visit: Payer: Self-pay

## 2019-03-13 DIAGNOSIS — J90 Pleural effusion, not elsewhere classified: Secondary | ICD-10-CM

## 2019-03-13 DIAGNOSIS — Z9989 Dependence on other enabling machines and devices: Secondary | ICD-10-CM | POA: Diagnosis not present

## 2019-03-13 DIAGNOSIS — G4733 Obstructive sleep apnea (adult) (pediatric): Secondary | ICD-10-CM

## 2019-03-13 NOTE — Assessment & Plan Note (Signed)
His main complaint is that he is not getting enough air.  I feel that this is because his auto CPAP starts off to local, but also may be due to ramp setting. We will take over the ramp setting and change to auto 10 to 15 cm, his average pressure is 13 cm today should cover that. CPAP supplies will be renewed for a year We discussed cardiovascular implications of OSA  Weight loss encouraged, compliance with goal of at least 4-6 hrs every night is the expectation. Advised against medications with sedative side effects Cautioned against driving when sleepy - understanding that sleepiness will vary on a day to day basis

## 2019-03-13 NOTE — Addendum Note (Signed)
Addended by: Lia Foyer R on: 03/13/2019 02:30 PM   Modules accepted: Orders

## 2019-03-13 NOTE — Assessment & Plan Note (Signed)
Resolved, sounds clear today and therefore does not require imaging He has obtained Covid vaccination, shot #1 and encouraged him to get the second shot in 3 weeks

## 2019-03-13 NOTE — Progress Notes (Signed)
   Subjective:    Patient ID: Tyler Norris, male    DOB: 1934/10/18, 85 y.o.   MRN: [id number]  HPI 85 yo yo never smoker with SLE diagnosed 209for FU of OSA .   Maintained on auto CPAP   Was on INH for Quantiferon test -positive , finished course from 12/2012 . Followed by Estanislado Pandy for SLE, on Plaquenil . Off prednisone. Pleural effusions resolved. H/o pulmonary embolism 05/2012 - finished coumadin course 02/2013 .   Chief Complaint  Patient presents with  . Follow-up    Patient is here for CPAP. Patient is not sure about pressure settings.    Breathing is stable.  No intercurrent chest colds. He complains that his CPAP machine pressure is too low, he has tried playing around with the buttons but not able to adjust this. No problems with nasal mask. CPAP download was reviewed which shows average pressure of 13 cm on auto settings 5 to 15 cm with EPR of one, few missed nights but on average good compliance  He remains on Plaquenil, received his Covid vaccine yesterday   Significant tests/ events reviewed  Thoracentesis 04/2012 of 230 mL of monocytic exudative fluid.   CT angiogram on 06/05/12 showed small subsegmental pulmonary emboli in the right upper lobe, right middle lobe and left lower lobe. A right effusion was noted somewhat loculated and a small left effusion and was layering.    Adm 07/2014 -Pericardial effusion with near tamponade, picked up on CT angio , resolved on FU 10/2014 - on NSAIDs   PFTs - 04/2014 - unchanged, FEV1 79%, FVC 78% improves to >80% with BDs, TLC 68%, DLCO 55%  4/2017He developed left pleural effusion and underwent thoracentesis -about 60 mL >> monocytic exudate as before>> plaquanil added  Review of Systems Patient denies significant dyspnea,cough, hemoptysis,  chest pain, palpitations, pedal edema, orthopnea, paroxysmal nocturnal dyspnea, lightheadedness, nausea, vomiting, abdominal or  leg pains      Objective:   Physical  Exam  Gen. Pleasant,elderly, well-nourished, in no distress ENT - no thrush, no pallor/icterus,no post nasal drip Neck: No JVD, no thyromegaly, no carotid bruits Lungs: no use of accessory muscles, no dullness to percussion, clear without rales or rhonchi  Cardiovascular: Rhythm regular, heart sounds  normal, no murmurs or gallops, no peripheral edema Musculoskeletal: No deformities, no cyanosis or clubbing        Assessment & Plan:

## 2019-03-13 NOTE — Patient Instructions (Addendum)
Change CPAP to auto 10-15 cm

## 2019-03-14 ENCOUNTER — Other Ambulatory Visit: Payer: Self-pay | Admitting: *Deleted

## 2019-03-14 MED ORDER — HYDROXYCHLOROQUINE SULFATE 200 MG PO TABS: 200.0000 mg | ORAL_TABLET | Freq: Two times a day (BID) | ORAL | 0 refills | Status: DC

## 2019-03-14 NOTE — Telephone Encounter (Signed)
Refill request received via fax  Last Visit: 10/22/18 Next Visit: 03/24/19 Labs: 10/21/18 RBC count and Hct are borderline low. Rest of CBC WNL. Glucose is elevated-145. Alk phos is borderline low. BUN elevated.  PLQ Eye Exam: 12/31/17 WNL  Patient advised he is due to update his PLQ eye exam. Patient states he had his exam and will have the eye doctor fax results.   Okay to refill 30 day supply per Dr. Estanislado Pandy

## 2019-03-17 ENCOUNTER — Telehealth: Payer: Self-pay | Admitting: Rheumatology

## 2019-03-17 NOTE — Telephone Encounter (Signed)
Noted  

## 2019-03-17 NOTE — Telephone Encounter (Signed)
Patient states he had Plaquenil eye exam with Dr. Kathrin Penner.  Patient states they should be faxing over the results.

## 2019-03-19 ENCOUNTER — Telehealth: Payer: Self-pay | Admitting: Rheumatology

## 2019-03-19 NOTE — Telephone Encounter (Signed)
Contacted the pharmacy and advised them of the diagnosis code. The will add to patient's chart and ship medication.

## 2019-03-19 NOTE — Telephone Encounter (Signed)
[facility] left a voicemail stating a diagnosis is needed in order to fill the prescription of Hydroxychloroquine.  Please call back at [phone] and use Ref [id number]   They will hold order open for 24 hours.

## 2019-03-24 ENCOUNTER — Ambulatory Visit: Payer: Medicare Other | Admitting: Rheumatology

## 2019-04-22 NOTE — Progress Notes (Signed)
Office Visit Note  Patient: Tyler Norris             Date of Birth: 1935-01-01           MRN: [id number]             PCP: [name], MD Referring: [name], MD Visit Date: 04/28/2019 Occupation: @GUAROCC @  Subjective:  Left knee joint pain   History of Present Illness: Tyler Norris is a 85 y.o. male with history of systemic lupus erythematosus.  Patient is taking Plaquenil 200 mg 1 tablet twice daily.  He denies any signs or symptoms of a flare.  He has chronic pain in the left knee joint.  He denies any other joint pain or joint swelling.  He denies any recent rashes, oral or nasal ulcers, hair loss, photosensitivity, or enlarged lymph nodes.   Activities of Daily Living:  Patient reports morning stiffness for 1 minute.   Patient Reports nocturnal pain.  Difficulty dressing/grooming: Reports Difficulty climbing stairs: Denies Difficulty getting out of chair: Denies Difficulty using hands for taps, buttons, cutlery, and/or writing: Denies  Review of Systems  Constitutional: Positive for fatigue. Negative for night sweats.  HENT: Negative for mouth sores, mouth dryness and nose dryness.   Eyes: Positive for dryness. Negative for redness.  Respiratory: Negative for cough, hemoptysis, shortness of breath and difficulty breathing.   Cardiovascular: Negative for chest pain, palpitations, hypertension, irregular heartbeat and swelling in legs/feet.  Gastrointestinal: Negative for abdominal pain, blood in stool, constipation and diarrhea.  Endocrine: Negative for increased urination.  Genitourinary: Negative for difficulty urinating and painful urination.  Musculoskeletal: Positive for arthralgias, joint pain, joint swelling and morning stiffness. Negative for myalgias, muscle weakness, muscle tenderness and myalgias.  Skin: Negative for color change, rash, hair loss, nodules/bumps, skin tightness, ulcers and sensitivity to sunlight.  Allergic/Immunologic:  Negative for susceptible to infections.  Neurological: Negative for fainting, headaches, memory loss, night sweats and weakness.  Hematological: Negative for bruising/bleeding tendency and swollen glands.  Psychiatric/Behavioral: Negative for depressed mood, confusion and sleep disturbance. The patient is not nervous/anxious.     PMFS History:  Patient Active Problem List   Diagnosis Date Noted  . SDH (subdural hematoma) ([place]) 01/08/2017  . Pleural effusion on left 06/02/2015  . Aneurysm of thoracic aorta ([place]) 06/02/2015  . SOB (shortness of breath) 08/22/2014  . Pericardial effusion 08/22/2014  . Lupus (systemic lupus erythematosus) ([place])   . 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 PE (pulmonary embolism) 06/05/2012  . Pleural effusion exudative 05/26/2012  . Essential hypertension 05/13/2012  . OSA on CPAP 05/13/2012  . Cardiomegaly 05/13/2012  . Abdominal aneurysm without mention of rupture 06/27/2011    Past Medical History:  Diagnosis Date  . AAA (abdominal aortic aneurysm) ([place])    a. resolved by ct scan 09-2011  . Anemia   . Arthritis   . Barrett's esophagus   . Carpal tunnel syndrome, bilateral   . Essential hypertension   . Fibromyalgia   . GERD (gastroesophageal reflux disease)   . History of pneumonia   . Hyperlipidemia   . Lupus (systemic lupus erythematosus) ([place])   . Nephrolithiasis   . Pericardial effusion    a. 08/22/2014 Echo: EF 60-65%, mildly dil Ao root (54m), mildly dil LA, mod-large partially organized pericardial effusion with possible early tamponade - asymptomatic on high dose nsaids-->conservative mgmt;  b. 08/2014 Limited Echo: EF 55-60%, Triv - small effusion.  . Sleep apnea    a. uses  cpap every night    Family History  Problem Relation Age of Onset  . Diabetes Mother   . Diabetes Sister   . Stroke Sister   . Healthy Daughter   . Healthy Son    Past Surgical History:  Procedure Laterality Date  . APPENDECTOMY    . CARPAL TUNNEL  RELEASE  10/12/2011   Procedure: CARPAL TUNNEL RELEASE;  Surgeon: Cammie Sickle., MD;  Location: [place];  Service: Orthopedics;  Laterality: Left;  . CARPAL TUNNEL RELEASE  12/01/2011   Procedure: CARPAL TUNNEL RELEASE;  Surgeon: Cammie Sickle., MD;  Location: [place];  Service: Orthopedics;  Laterality: Right;  . CYSTOSCOPY W/ STONE MANIPULATION  [id number]  . HAND SURGERY     scar repaired lt hand  . HERNIA REPAIR  1966  . KNEE ARTHROPLASTY    . KNEE ARTHROSCOPY Left   . TONSILLECTOMY     Social History   Social History Narrative  . Not on file   Immunization History  Administered Date(s) Administered  . Fluad Quad(high Dose 65+) 01/16/2019  . Influenza Nasal 11/28/2011  . Influenza Split 11/27/2013, 11/28/2014  . Influenza,inj,Quad PF,6+ Mos 11/11/2012, 10/29/2015  . Influenza-Unspecified 11/16/2014  . Moderna SARS-COVID-2 Vaccination 03/12/2019  . Pneumococcal Polysaccharide-23 05/15/2012  . Tdap 01/07/2017  . Zoster Recombinat (Shingrix) 01/16/2018, 03/17/2018     Objective: Vital Signs: BP (!) 175/92 (BP Location: Left Arm, Patient Position: Sitting, Cuff Size: Normal)   Pulse 75   Resp 16   Ht 5' 9"  (1.753 m)   Wt 214 lb (97.1 kg)   BMI 31.60 kg/m    Physical Exam Vitals and nursing note reviewed.  Constitutional:      Appearance: He is well-developed.  HENT:     Head: Normocephalic and atraumatic.  Eyes:     Conjunctiva/sclera: Conjunctivae normal.     Pupils: Pupils are equal, round, and reactive to light.  Pulmonary:     Effort: Pulmonary effort is normal.  Abdominal:     General: Bowel sounds are normal.     Palpations: Abdomen is soft.  Musculoskeletal:     Cervical back: Normal range of motion and neck supple.  Skin:    General: Skin is warm and dry.     Capillary Refill: Capillary refill takes less than 2 seconds.  Neurological:     Mental Status: He is alert and oriented to person, place, and time.    Psychiatric:        Behavior: Behavior normal.      Musculoskeletal Exam: C-spine limited ROM.  Thoracic kyphosis noted. Shoulder joints, elbow joints, wrist joints, MCPs, PIPs, and DIPs good ROM with no synovitis.  Bilateral 1st, 2nd, and 3rd MCP joints. Ulnar deviation bilaterally.  PIP and DIP synovial thickening consistent with osteoarthritis of both hands. Hip joints good ROM with no discomfort.  Knee joints good ROM.  Ankle joints good ROM with no tenderness or inflammation.    CDAI Exam: CDAI Score: -- Patient Global: --; Provider Global: -- Swollen: --; Tender: -- Joint Exam 04/28/2019   No joint exam has been documented for this visit   There is currently no information documented on the homunculus. Go to the Rheumatology activity and complete the homunculus joint exam.  Investigation: No additional findings.  Imaging: No results found.  Recent Labs: Lab Results  Component Value Date   WBC 5.9 10/21/2018   HGB 13.2 10/21/2018   PLT 179 10/21/2018   NA  142 10/21/2018   K 4.5 10/21/2018   CL 105 10/21/2018   CO2 30 10/21/2018   GLUCOSE 145 (H) 10/21/2018   BUN 27 (H) 10/21/2018   CREATININE 0.99 10/21/2018   BILITOT 0.7 10/21/2018   ALKPHOS 43 08/22/2014   AST 15 10/21/2018   ALT 10 10/21/2018   PROT 6.5 10/21/2018   ALBUMIN 3.3 (L) 08/22/2014   CALCIUM 9.8 10/21/2018   GFRAA 82 10/21/2018    Speciality Comments: PLQ Eye Exam: 02/27/2019 @ [facility] Follow up in 1 year.  Procedures:  No procedures performed Allergies: Atorvastatin and Lipitor [atorvastatin calcium]     Assessment / Plan:     Visit Diagnoses: Other systemic lupus erythematosus with other organ involvement (HCC) - +ANA, +RF: He has not had any signs or symptoms of a systemic lupus flare.  He is clinically doing well on Plaquenil 200 mg 1 tablet by mouth twice daily.  He has chronic pain in the left knee joint but no warmth or effusion was noted on exam.  No joint synovitis  noted.  He has not had any recent rashes, photosensitivity, hair loss, symptoms of Raynaud's, oral or nasal ulcerations, or enlarged lymph nodes.  He has ongoing eye dryness but no mouth dryness.  No malar rash noted. No digital ulcerations or signs of gangrene noted. We reviewed lab work on 10/21/18 (dsDNA 6, complements WNL, ESR WNL).  Vitamin D was 40 on 05/20/18.  We discussed that the optimal level for vitamin D is between 50-60 in lupus patients.  He is taking a vitamin D supplement. We will check autoimmune lab work today.  He will continue taking plaquenil 200 mg 1 tablet by mouth twice daily.  He was advised to notify us if he develops signs or symptoms of a flare.  He will follow up in 5 months.  - Plan: Urinalysis, Routine w reflex microscopic, Anti-DNA antibody, double-stranded, C3 and C4, Sedimentation rate  High risk medication use - Plaquenil 200 mg twice daily.Eye Exam: 02/27/2019 CBC and CMP were drawn on 10/21/18.  He is due to update CBC and CMP today.  Orders released.  He received both covid-19 vaccinations.  - Plan: CBC with Differential/Platelet, COMPLETE METABOLIC PANEL WITH GFR  Positive QuantiFERON-TB Gold test  History of pulmonary embolism  Other proteinuria - Patient was evaluated by nephrology in the past. UA ordered today.   Other medical conditions are listed as follows:   History of gastroesophageal reflux (GERD)  History of pleural effusion - in the past.  History of sleep apnea  Orders: Orders Placed This Encounter  Procedures  . CBC with Differential/Platelet  . COMPLETE METABOLIC PANEL WITH GFR  . Urinalysis, Routine w reflex microscopic  . Anti-DNA antibody, double-stranded  . C3 and C4  . Sedimentation rate   No orders of the defined types were placed in this encounter.    Follow-Up Instructions: Return in about 5 months (around 09/28/2019) for Systemic lupus erythematosus.   Ofilia Neas, PA-C  Note - This record has been created using Dragon  software.  Chart creation errors have been sought, but may not always  have been located. Such creation errors do not reflect on  the standard of medical care.

## 2019-04-28 ENCOUNTER — Other Ambulatory Visit: Payer: Self-pay

## 2019-04-28 ENCOUNTER — Ambulatory Visit (INDEPENDENT_AMBULATORY_CARE_PROVIDER_SITE_OTHER): Payer: Medicare Other | Admitting: Physician Assistant

## 2019-04-28 ENCOUNTER — Encounter: Payer: Self-pay | Admitting: Physician Assistant

## 2019-04-28 VITALS — BP 175/92 | HR 75 | Resp 16 | Ht 69.0 in | Wt 214.0 lb

## 2019-04-28 DIAGNOSIS — Z8719 Personal history of other diseases of the digestive system: Secondary | ICD-10-CM

## 2019-04-28 DIAGNOSIS — R7612 Nonspecific reaction to cell mediated immunity measurement of gamma interferon antigen response without active tuberculosis: Secondary | ICD-10-CM

## 2019-04-28 DIAGNOSIS — Z79899 Other long term (current) drug therapy: Secondary | ICD-10-CM

## 2019-04-28 DIAGNOSIS — Z8669 Personal history of other diseases of the nervous system and sense organs: Secondary | ICD-10-CM

## 2019-04-28 DIAGNOSIS — M3219 Other organ or system involvement in systemic lupus erythematosus: Secondary | ICD-10-CM

## 2019-04-28 DIAGNOSIS — Z86711 Personal history of pulmonary embolism: Secondary | ICD-10-CM

## 2019-04-28 DIAGNOSIS — R808 Other proteinuria: Secondary | ICD-10-CM | POA: Diagnosis not present

## 2019-04-28 DIAGNOSIS — Z8709 Personal history of other diseases of the respiratory system: Secondary | ICD-10-CM | POA: Diagnosis not present

## 2019-04-29 LAB — CBC WITH DIFFERENTIAL/PLATELET
Absolute Monocytes: 510 cells/uL (ref 200–950)
Basophils Absolute: 52 cells/uL (ref 0–200)
Basophils Relative: 1 %
Eosinophils Absolute: 109 cells/uL (ref 15–500)
Eosinophils Relative: 2.1 %
HCT: 40.4 % (ref 38.5–50.0)
Hemoglobin: 13.8 g/dL (ref 13.2–17.1)
Lymphs Abs: 1170 cells/uL (ref 850–3900)
MCH: 31.4 pg (ref 27.0–33.0)
MCHC: 34.2 g/dL (ref 32.0–36.0)
MCV: 92 fL (ref 80.0–100.0)
MPV: 10.9 fL (ref 7.5–12.5)
Monocytes Relative: 9.8 %
Neutro Abs: 3359 cells/uL (ref 1500–7800)
Neutrophils Relative %: 64.6 %
Platelets: 183 10*3/uL (ref 140–400)
RBC: 4.39 10*6/uL (ref 4.20–5.80)
RDW: 12 % (ref 11.0–15.0)
Total Lymphocyte: 22.5 %
WBC: 5.2 10*3/uL (ref 3.8–10.8)

## 2019-04-29 LAB — URINALYSIS, ROUTINE W REFLEX MICROSCOPIC
Bacteria, UA: NONE SEEN /HPF
Bilirubin Urine: NEGATIVE
Glucose, UA: NEGATIVE
Hgb urine dipstick: NEGATIVE
Hyaline Cast: NONE SEEN /LPF
Ketones, ur: NEGATIVE
Leukocytes,Ua: NEGATIVE
Nitrite: NEGATIVE
Specific Gravity, Urine: 1.016 (ref 1.001–1.03)
Squamous Epithelial / HPF: NONE SEEN /HPF (ref <=5)
pH: 6.5 (ref 5.0–8.0)

## 2019-04-29 LAB — C3 AND C4
C3 Complement: 118 mg/dL
C4 Complement: 18 mg/dL

## 2019-04-29 LAB — COMPLETE METABOLIC PANEL WITH GFR
AG Ratio: 2.6 (calc) — ABNORMAL HIGH (ref 1.0–2.5)
ALT: 12 U/L (ref 9–46)
AST: 16 U/L (ref 10–35)
Albumin: 4.6 g/dL (ref 3.6–5.1)
Alkaline phosphatase (APISO): 36 U/L (ref 35–144)
BUN: 20 mg/dL (ref 7–25)
CO2: 26 mmol/L (ref 20–32)
Calcium: 9.9 mg/dL (ref 8.6–10.3)
Chloride: 105 mmol/L (ref 98–110)
Creat: 0.99 mg/dL (ref 0.70–1.11)
GFR, Est African American: 82 mL/min/{1.73_m2} (ref >=60)
GFR, Est Non African American: 71 mL/min/{1.73_m2} (ref >=60)
Globulin: 1.8 g/dL (calc) — ABNORMAL LOW (ref 1.9–3.7)
Glucose, Bld: 173 mg/dL — ABNORMAL HIGH (ref 65–99)
Potassium: 4.1 mmol/L (ref 3.5–5.3)
Sodium: 141 mmol/L (ref 135–146)
Total Bilirubin: 0.7 mg/dL (ref 0.2–1.2)
Total Protein: 6.4 g/dL (ref 6.1–8.1)

## 2019-04-29 LAB — ANTI-DNA ANTIBODY, DOUBLE-STRANDED: ds DNA Ab: 3 IU/mL

## 2019-04-29 LAB — SEDIMENTATION RATE: Sed Rate: 9 mm/h (ref 0–20)

## 2019-04-29 NOTE — Progress Notes (Signed)
CBC is normal, glucose is elevated, UA showed trace protein.  C3-C4 are normal and sed rate is normal.  Double-stranded DNA is pending.  Labs do not indicate active disease.  Please forward labs to his PCP.

## 2019-05-17 ENCOUNTER — Other Ambulatory Visit: Payer: Self-pay | Admitting: Rheumatology

## 2019-05-19 NOTE — Telephone Encounter (Signed)
Last Visit: 04/28/19 Next Visit: 09/29/19 Labs: 04/28/19 CBC is normal, glucose is elevated Eye exam: 02/27/19  Current Dose per office note on 04/28/19: Plaquenil 200 mg twice daily  Okay to refill per Dr. Deveshwar  

## 2019-05-20 ENCOUNTER — Telehealth: Payer: Self-pay | Admitting: Rheumatology

## 2019-05-20 MED ORDER — HYDROXYCHLOROQUINE SULFATE 200 MG PO TABS: 200.0000 mg | ORAL_TABLET | Freq: Two times a day (BID) | ORAL | 0 refills | Status: DC

## 2019-05-20 NOTE — Telephone Encounter (Signed)
Last Visit: 04/28/19 Next Visit: 09/29/19 Labs: 04/28/19 CBC is normal, glucose is elevated Eye exam: 02/27/19  Current Dose per office note on 04/28/19: Plaquenil 200 mg twice daily  Okay to refill per Dr. Deveshwar  

## 2019-05-20 NOTE — Telephone Encounter (Signed)
Patient called stating [facility] told him he will be receiving his Plaquenil prescription on 05/25/19.  Patient is requesting a small prescription (approximately 8 pills) to be sent to his local pharmacy [facility] at [address].  Patient states he is out of medication.

## 2019-05-21 ENCOUNTER — Telehealth: Payer: Self-pay | Admitting: Rheumatology

## 2019-05-21 NOTE — Telephone Encounter (Signed)
Patient advised we received a fax from [place] and it has been faxed to clarify dx code.

## 2019-05-21 NOTE — Telephone Encounter (Signed)
Patient called to let you know [facility] needs to clarify diagnosis code for Plaquenil before they can send it out for 05/25/19 delivery. Patient requests you call to clarify. [facility] told patient they tried to get info from [place] yesterday, but have heard nothing back.

## 2019-06-17 DIAGNOSIS — Z125 Encounter for screening for malignant neoplasm of prostate: Secondary | ICD-10-CM | POA: Diagnosis not present

## 2019-06-17 DIAGNOSIS — N5201 Erectile dysfunction due to arterial insufficiency: Secondary | ICD-10-CM | POA: Diagnosis not present

## 2019-06-17 DIAGNOSIS — R351 Nocturia: Secondary | ICD-10-CM | POA: Diagnosis not present

## 2019-06-24 DIAGNOSIS — I129 Hypertensive chronic kidney disease with stage 1 through stage 4 chronic kidney disease, or unspecified chronic kidney disease: Secondary | ICD-10-CM | POA: Diagnosis not present

## 2019-06-24 DIAGNOSIS — E785 Hyperlipidemia, unspecified: Secondary | ICD-10-CM | POA: Diagnosis not present

## 2019-06-24 DIAGNOSIS — I714 Abdominal aortic aneurysm, without rupture: Secondary | ICD-10-CM | POA: Diagnosis not present

## 2019-06-24 DIAGNOSIS — M329 Systemic lupus erythematosus, unspecified: Secondary | ICD-10-CM | POA: Diagnosis not present

## 2019-06-24 DIAGNOSIS — R809 Proteinuria, unspecified: Secondary | ICD-10-CM | POA: Diagnosis not present

## 2019-06-24 DIAGNOSIS — K219 Gastro-esophageal reflux disease without esophagitis: Secondary | ICD-10-CM | POA: Diagnosis not present

## 2019-06-24 DIAGNOSIS — N182 Chronic kidney disease, stage 2 (mild): Secondary | ICD-10-CM | POA: Diagnosis not present

## 2019-09-02 ENCOUNTER — Other Ambulatory Visit: Payer: Self-pay | Admitting: Rheumatology

## 2019-09-02 NOTE — Telephone Encounter (Signed)
Last Visit: 04/28/19 Next Visit: 09/29/19 Labs: 04/28/19 CBC is normal, glucose is elevated Eye exam: 02/27/19  Current Dose per office note on 04/28/19: Plaquenil 200 mg twice daily  Okay to refill per Dr. Estanislado Pandy

## 2019-09-15 NOTE — Progress Notes (Signed)
Office Visit Note  Patient: Tyler Norris             Date of Birth: 1934/12/25           MRN: [id number]             PCP: [name], MD Referring: [name], MD Visit Date: 09/29/2019 Occupation: @GUAROCC @  Subjective:  Medication monitoring.   History of Present Illness: Tyler Norris is a 85 y.o. male with history of systemic lupus rhythmic ptosis.  He states he has been doing well.  He denies any joint pain or joint swelling.  He denies any history of oral ulcers, nasal ulcers, malar rash, photosensitivity or Raynaud's phenomenon.  He has been tolerating Plaquenil well.  He states he was having a lot of dizzy episodes which he relates from overdosing on Micardis.  He states his symptoms have improved since he has reduced the dose of the medication.  Activities of Daily Living:  Patient reports morning stiffness for a few  minutes.   Patient Denies nocturnal pain.  Difficulty dressing/grooming: Denies Difficulty climbing stairs: Reports Difficulty getting out of chair: Reports Difficulty using hands for taps, buttons, cutlery, and/or writing: Denies  Review of Systems  Constitutional: Positive for fatigue.  HENT: Positive for mouth dryness. Negative for mouth sores and nose dryness.   Eyes: Negative for itching and dryness.  Respiratory: Positive for shortness of breath. Negative for difficulty breathing.   Cardiovascular: Negative for chest pain and palpitations.  Gastrointestinal: Negative for blood in stool, constipation and diarrhea.  Endocrine: Negative for increased urination.  Genitourinary: Negative for difficulty urinating.  Musculoskeletal: Positive for arthralgias, joint pain and morning stiffness. Negative for joint swelling, myalgias, muscle tenderness and myalgias.  Skin: Positive for color change. Negative for rash and redness.  Allergic/Immunologic: Negative for susceptible to infections.  Neurological: Positive for weakness. Negative for  dizziness, numbness, headaches and memory loss.  Hematological: Negative for bruising/bleeding tendency.  Psychiatric/Behavioral: Negative for confusion.    PMFS History:  Patient Active Problem List   Diagnosis Date Noted  . SDH (subdural hematoma) ([place]) 01/08/2017  . Pleural effusion on left 06/02/2015  . Aneurysm of thoracic aorta ([place]) 06/02/2015  . SOB (shortness of breath) 08/22/2014  . Pericardial effusion 08/22/2014  . Lupus (systemic lupus erythematosus) ([place])   . 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 PE (pulmonary embolism) 06/05/2012  . Pleural effusion exudative 05/26/2012  . Essential hypertension 05/13/2012  . OSA on CPAP 05/13/2012  . Cardiomegaly 05/13/2012  . Abdominal aneurysm without mention of rupture 06/27/2011    Past Medical History:  Diagnosis Date  . AAA (abdominal aortic aneurysm) ([place])    a. resolved by ct scan 09-2011  . Anemia   . Arthritis   . Barrett's esophagus   . Carpal tunnel syndrome, bilateral   . Essential hypertension   . Fibromyalgia   . GERD (gastroesophageal reflux disease)   . History of pneumonia   . Hyperlipidemia   . Lupus (systemic lupus erythematosus) ([place])   . Nephrolithiasis   . Pericardial effusion    a. 08/22/2014 Echo: EF 60-65%, mildly dil Ao root (57mm), mildly dil LA, mod-large partially organized pericardial effusion with possible early tamponade - asymptomatic on high dose nsaids-->conservative mgmt;  b. 08/2014 Limited Echo: EF 55-60%, Triv - small effusion.  . Sleep apnea    a. uses cpap every night    Family History  Problem Relation Age of Onset  . Diabetes Mother   .  Diabetes Sister   . Stroke Sister   . Healthy Daughter   . Healthy Son    Past Surgical History:  Procedure Laterality Date  . APPENDECTOMY    . CARPAL TUNNEL RELEASE  10/12/2011   Procedure: CARPAL TUNNEL RELEASE;  Surgeon: Cammie Sickle., MD;  Location: [place];  Service: Orthopedics;  Laterality: Left;  .  CARPAL TUNNEL RELEASE  12/01/2011   Procedure: CARPAL TUNNEL RELEASE;  Surgeon: Cammie Sickle., MD;  Location: [place];  Service: Orthopedics;  Laterality: Right;  . CYSTOSCOPY W/ STONE MANIPULATION  [id number]  . HAND SURGERY     scar repaired lt hand  . HERNIA REPAIR  1966  . KNEE ARTHROPLASTY    . KNEE ARTHROSCOPY Left   . TONSILLECTOMY     Social History   Social History Narrative  . Not on file   Immunization History  Administered Date(s) Administered  . Fluad Quad(high Dose 65+) 01/16/2019  . Influenza Nasal 11/28/2011  . Influenza Split 11/27/2013, 11/28/2014  . Influenza,inj,Quad PF,6+ Mos 11/11/2012, 10/29/2015  . Influenza-Unspecified 11/16/2014  . Moderna SARS-COVID-2 Vaccination 03/12/2019  . Pneumococcal Polysaccharide-23 05/15/2012  . Tdap 01/07/2017  . Zoster Recombinat (Shingrix) 01/16/2018, 03/17/2018     Objective: Vital Signs: BP (!) 172/82 (BP Location: Left Arm, Patient Position: Sitting, Cuff Size: Normal)   Pulse 66   Resp 15   Ht 5' 9.5" (1.765 m)   Wt (!) 212 lb 12.8 oz (96.5 kg)   BMI 30.97 kg/m    Physical Exam Vitals and nursing note reviewed.  Constitutional:      Appearance: He is well-developed.  HENT:     Head: Normocephalic and atraumatic.  Eyes:     Conjunctiva/sclera: Conjunctivae normal.     Pupils: Pupils are equal, round, and reactive to light.  Cardiovascular:     Rate and Rhythm: Normal rate and regular rhythm.     Heart sounds: Normal heart sounds.  Pulmonary:     Effort: Pulmonary effort is normal.     Breath sounds: Normal breath sounds.  Abdominal:     General: Bowel sounds are normal.     Palpations: Abdomen is soft.  Musculoskeletal:     Cervical back: Normal range of motion and neck supple.  Skin:    General: Skin is warm and dry.     Capillary Refill: Capillary refill takes less than 2 seconds.  Neurological:     Mental Status: He is alert and oriented to person, place, and time.    Psychiatric:        Behavior: Behavior normal.      Musculoskeletal Exam: C-spine thoracic and lumbar spine good range of motion.  Shoulder joints, elbow joints, wrist joints, MCPs PIPs and DIPs with good range of motion with no synovitis.  Hip joints, knee joints, ankles, MTPs and PIPs with good range of motion with no synovitis. CDAI Exam: CDAI Score: -- Patient Global: --; Provider Global: -- Swollen: --; Tender: -- Joint Exam 09/29/2019   No joint exam has been documented for this visit   There is currently no information documented on the homunculus. Go to the Rheumatology activity and complete the homunculus joint exam.  Investigation: No additional findings.  Imaging: No results found.  Recent Labs: Lab Results  Component Value Date   WBC 5.2 04/28/2019   HGB 13.8 04/28/2019   PLT 183 04/28/2019   NA 141 04/28/2019   K 4.1 04/28/2019   CL 105  04/28/2019   CO2 26 04/28/2019   GLUCOSE 173 (H) 04/28/2019   BUN 20 04/28/2019   CREATININE 0.99 04/28/2019   BILITOT 0.7 04/28/2019   ALKPHOS 43 08/22/2014   AST 16 04/28/2019   ALT 12 04/28/2019   PROT 6.4 04/28/2019   ALBUMIN 3.3 (L) 08/22/2014   CALCIUM 9.9 04/28/2019   GFRAA 82 04/28/2019    Speciality Comments: PLQ Eye Exam: 02/27/2019 @ [facility] Follow up in 1 year.  Procedures:  No procedures performed Allergies: Atorvastatin and Lipitor [atorvastatin calcium]   Assessment / Plan:     Visit Diagnoses: Other systemic lupus erythematosus with other organ involvement (HCC) -  - +ANA, +RF: He is clinically doing very well with no symptoms of systemic lupus or rheumatoid arthritis now.  His labs have been stable.  We had discussion regarding reducing dose of Plaquenil.  He was in agreement.  Have reduced his dose of Plaquenil 200 mg p.o. twice daily Monday to Friday.  If he develops any increased symptoms he supposed to notify me.  High risk medication use - Plaquenil 200 mg twice daily.Eye Exam:  02/27/2019  - Plan: CBC with Differential/Platelet, COMPLETE METABOLIC PANEL WITH GFR  Positive QuantiFERON-TB Gold test  History of pulmonary embolism  Other proteinuria  History of gastroesophageal reflux (GERD)  History of pleural effusion  History of sleep apnea  OSA on CPAP  Essential hypertension  Thoracic aortic aneurysm without rupture [facility])  Patient has had COVID-19 vaccination.  I have advised him to continue practicing use of mask, social distancing and hand hygiene as he has underlying autoimmune disease.  Orders: Orders Placed This Encounter  Procedures  . CBC with Differential/Platelet  . COMPLETE METABOLIC PANEL WITH GFR   No orders of the defined types were placed in this encounter.     Follow-Up Instructions: Return in about 5 months (around 02/29/2020) for Systemic lupus.   Bo Merino, MD  Note - This record has been created using Editor, commissioning.  Chart creation errors have been sought, but may not always  have been located. Such creation errors do not reflect on  the standard of medical care.

## 2019-09-25 ENCOUNTER — Other Ambulatory Visit: Payer: Self-pay | Admitting: *Deleted

## 2019-09-29 ENCOUNTER — Encounter: Payer: Self-pay | Admitting: Rheumatology

## 2019-09-29 ENCOUNTER — Other Ambulatory Visit: Payer: Self-pay

## 2019-09-29 ENCOUNTER — Ambulatory Visit (INDEPENDENT_AMBULATORY_CARE_PROVIDER_SITE_OTHER): Payer: Medicare Other | Admitting: Rheumatology

## 2019-09-29 VITALS — BP 172/82 | HR 66 | Resp 15 | Ht 69.5 in | Wt 212.8 lb

## 2019-09-29 DIAGNOSIS — Z8719 Personal history of other diseases of the digestive system: Secondary | ICD-10-CM

## 2019-09-29 DIAGNOSIS — I712 Thoracic aortic aneurysm, without rupture, unspecified: Secondary | ICD-10-CM

## 2019-09-29 DIAGNOSIS — Z86711 Personal history of pulmonary embolism: Secondary | ICD-10-CM

## 2019-09-29 DIAGNOSIS — Z8669 Personal history of other diseases of the nervous system and sense organs: Secondary | ICD-10-CM | POA: Diagnosis not present

## 2019-09-29 DIAGNOSIS — G4733 Obstructive sleep apnea (adult) (pediatric): Secondary | ICD-10-CM

## 2019-09-29 DIAGNOSIS — R7612 Nonspecific reaction to cell mediated immunity measurement of gamma interferon antigen response without active tuberculosis: Secondary | ICD-10-CM | POA: Diagnosis not present

## 2019-09-29 DIAGNOSIS — Z9989 Dependence on other enabling machines and devices: Secondary | ICD-10-CM

## 2019-09-29 DIAGNOSIS — Z79899 Other long term (current) drug therapy: Secondary | ICD-10-CM | POA: Diagnosis not present

## 2019-09-29 DIAGNOSIS — M3219 Other organ or system involvement in systemic lupus erythematosus: Secondary | ICD-10-CM | POA: Diagnosis not present

## 2019-09-29 DIAGNOSIS — I10 Essential (primary) hypertension: Secondary | ICD-10-CM | POA: Diagnosis not present

## 2019-09-29 DIAGNOSIS — Z8709 Personal history of other diseases of the respiratory system: Secondary | ICD-10-CM | POA: Diagnosis not present

## 2019-09-29 DIAGNOSIS — R808 Other proteinuria: Secondary | ICD-10-CM | POA: Diagnosis not present

## 2019-09-29 LAB — COMPLETE METABOLIC PANEL WITH GFR
AG Ratio: 2.6 (calc) — ABNORMAL HIGH (ref 1.0–2.5)
ALT: 12 U/L (ref 9–46)
AST: 15 U/L (ref 10–35)
Albumin: 4.6 g/dL (ref 3.6–5.1)
Alkaline phosphatase (APISO): 34 U/L — ABNORMAL LOW (ref 35–144)
BUN: 20 mg/dL (ref 7–25)
CO2: 24 mmol/L (ref 20–32)
Calcium: 9.7 mg/dL (ref 8.6–10.3)
Chloride: 105 mmol/L (ref 98–110)
Creat: 1.02 mg/dL (ref 0.70–1.11)
GFR, Est African American: 78 mL/min/{1.73_m2} (ref >=60)
GFR, Est Non African American: 68 mL/min/{1.73_m2} (ref >=60)
Globulin: 1.8 g/dL (calc) — ABNORMAL LOW (ref 1.9–3.7)
Glucose, Bld: 188 mg/dL — ABNORMAL HIGH (ref 65–99)
Potassium: 3.9 mmol/L (ref 3.5–5.3)
Sodium: 141 mmol/L (ref 135–146)
Total Bilirubin: 0.7 mg/dL (ref 0.2–1.2)
Total Protein: 6.4 g/dL (ref 6.1–8.1)

## 2019-09-29 LAB — CBC WITH DIFFERENTIAL/PLATELET
Absolute Monocytes: 490 cells/uL (ref 200–950)
Basophils Absolute: 59 cells/uL (ref 0–200)
Basophils Relative: 1.2 %
Eosinophils Absolute: 98 cells/uL (ref 15–500)
Eosinophils Relative: 2 %
HCT: 38.5 % (ref 38.5–50.0)
Hemoglobin: 12.8 g/dL — ABNORMAL LOW (ref 13.2–17.1)
Lymphs Abs: 1054 cells/uL (ref 850–3900)
MCH: 30.8 pg (ref 27.0–33.0)
MCHC: 33.2 g/dL (ref 32.0–36.0)
MCV: 92.8 fL (ref 80.0–100.0)
MPV: 10.9 fL (ref 7.5–12.5)
Monocytes Relative: 10 %
Neutro Abs: 3200 cells/uL (ref 1500–7800)
Neutrophils Relative %: 65.3 %
Platelets: 213 10*3/uL (ref 140–400)
RBC: 4.15 10*6/uL — ABNORMAL LOW (ref 4.20–5.80)
RDW: 12 % (ref 11.0–15.0)
Total Lymphocyte: 21.5 %
WBC: 4.9 10*3/uL (ref 3.8–10.8)

## 2019-09-29 MED ORDER — HYDROXYCHLOROQUINE SULFATE 200 MG PO TABS: ORAL_TABLET | ORAL | 2 refills | Status: DC

## 2019-09-29 NOTE — Patient Instructions (Addendum)
Please change hydroxychloroquine dose Hydroxychloroquine 200 mg tablet 1 tablet by mouth twice a day Monday to Friday only  Vaccines You are taking a medication(s) that can suppress your immune system.  The following immunizations are recommended: . Flu annually . Covid-19  o Please continue to wear your mask, practice social distancing and hand hygiene if you are on any medications that suppress your immune system or if you have an autoimmune disease. o If you are on methotrexate, Cellcept (mycophenolate), Rinvoq, Morrie Sheldon, and Olumiant - Hold medication for 1 week after each vaccine dose o If you are on Orencia Subcutaneous injections - Hold medication both one week prior to and one week after the first vaccine dose (only) o If you are on Orencia IV infusions - Time vaccine administration so that the first vaccination will occur four weeks after infusion . Pneumonia (Pneumovax 23 and Prevnar 13 spaced at least 1 year apart) . Shingrix (after age 57)  Please check with your PCP to make sure you are up to date.

## 2019-10-03 DIAGNOSIS — R3915 Urgency of urination: Secondary | ICD-10-CM | POA: Diagnosis not present

## 2019-10-03 DIAGNOSIS — N182 Chronic kidney disease, stage 2 (mild): Secondary | ICD-10-CM | POA: Diagnosis not present

## 2019-10-03 DIAGNOSIS — N529 Male erectile dysfunction, unspecified: Secondary | ICD-10-CM | POA: Diagnosis not present

## 2019-10-03 DIAGNOSIS — Z0000 Encounter for general adult medical examination without abnormal findings: Secondary | ICD-10-CM | POA: Diagnosis not present

## 2019-10-03 DIAGNOSIS — Z23 Encounter for immunization: Secondary | ICD-10-CM | POA: Diagnosis not present

## 2019-10-03 DIAGNOSIS — D509 Iron deficiency anemia, unspecified: Secondary | ICD-10-CM | POA: Diagnosis not present

## 2019-10-03 DIAGNOSIS — M329 Systemic lupus erythematosus, unspecified: Secondary | ICD-10-CM | POA: Diagnosis not present

## 2019-10-03 DIAGNOSIS — I10 Essential (primary) hypertension: Secondary | ICD-10-CM | POA: Diagnosis not present

## 2019-10-03 DIAGNOSIS — E119 Type 2 diabetes mellitus without complications: Secondary | ICD-10-CM | POA: Diagnosis not present

## 2019-10-03 DIAGNOSIS — G4733 Obstructive sleep apnea (adult) (pediatric): Secondary | ICD-10-CM | POA: Diagnosis not present

## 2019-10-03 DIAGNOSIS — I2510 Atherosclerotic heart disease of native coronary artery without angina pectoris: Secondary | ICD-10-CM | POA: Diagnosis not present

## 2019-10-03 DIAGNOSIS — E782 Mixed hyperlipidemia: Secondary | ICD-10-CM | POA: Diagnosis not present

## 2019-10-27 ENCOUNTER — Ambulatory Visit: Payer: Self-pay | Attending: Internal Medicine

## 2019-10-27 DIAGNOSIS — Z23 Encounter for immunization: Secondary | ICD-10-CM

## 2019-10-27 NOTE — Progress Notes (Signed)
   Covid-19 Vaccination Clinic  Name:  Tyler Norris    MRN: [id number] DOB: 26-Sep-1934  10/27/2019  [name] was observed post Covid-19 immunization for 15 minutes without incident. He was provided with Vaccine Information Sheet and instruction to access the V-Safe system.   [name] was instructed to call 911 with any severe reactions post vaccine: Marland Kitchen Difficulty breathing  . Swelling of face and throat  . A fast heartbeat  . A bad rash all over body  . Dizziness and weakness

## 2019-10-31 DIAGNOSIS — D649 Anemia, unspecified: Secondary | ICD-10-CM | POA: Diagnosis not present

## 2019-12-17 DIAGNOSIS — K219 Gastro-esophageal reflux disease without esophagitis: Secondary | ICD-10-CM | POA: Diagnosis not present

## 2019-12-17 DIAGNOSIS — R809 Proteinuria, unspecified: Secondary | ICD-10-CM | POA: Diagnosis not present

## 2019-12-17 DIAGNOSIS — I129 Hypertensive chronic kidney disease with stage 1 through stage 4 chronic kidney disease, or unspecified chronic kidney disease: Secondary | ICD-10-CM | POA: Diagnosis not present

## 2019-12-17 DIAGNOSIS — E785 Hyperlipidemia, unspecified: Secondary | ICD-10-CM | POA: Diagnosis not present

## 2019-12-17 DIAGNOSIS — N182 Chronic kidney disease, stage 2 (mild): Secondary | ICD-10-CM | POA: Diagnosis not present

## 2019-12-17 DIAGNOSIS — I714 Abdominal aortic aneurysm, without rupture: Secondary | ICD-10-CM | POA: Diagnosis not present

## 2019-12-17 DIAGNOSIS — M329 Systemic lupus erythematosus, unspecified: Secondary | ICD-10-CM | POA: Diagnosis not present

## 2019-12-20 ENCOUNTER — Other Ambulatory Visit: Payer: Self-pay | Admitting: Rheumatology

## 2019-12-22 NOTE — Telephone Encounter (Signed)
Last Visit: 09/29/2019 Next Visit: 03/04/2020 Labs: 09/29/2019 RBC count and Hgb are borderline low. Rest of CBC WNL. Glucose is elevated-188. Rest of CMP stable. Alk phos is borderline low but stable.  Eye exam: 02/27/2019   Current Dose per office note 09/29/2019:  reduced his dose of Plaquenil 200 mg p.o. twice daily Monday to Friday DX: Other systemic lupus erythematosus with other organ involvement   Okay to refill Plaquenil?

## 2019-12-23 DIAGNOSIS — E119 Type 2 diabetes mellitus without complications: Secondary | ICD-10-CM | POA: Diagnosis not present

## 2020-02-02 DIAGNOSIS — Z79899 Other long term (current) drug therapy: Secondary | ICD-10-CM | POA: Diagnosis not present

## 2020-02-02 DIAGNOSIS — H524 Presbyopia: Secondary | ICD-10-CM | POA: Diagnosis not present

## 2020-02-02 DIAGNOSIS — H25813 Combined forms of age-related cataract, bilateral: Secondary | ICD-10-CM | POA: Diagnosis not present

## 2020-02-02 DIAGNOSIS — H43813 Vitreous degeneration, bilateral: Secondary | ICD-10-CM | POA: Diagnosis not present

## 2020-02-19 NOTE — Progress Notes (Signed)
Office Visit Note  Patient: Tyler Norris             Date of Birth: [birth year]           MRN: [id number]             PCP: [name], MD Referring: [name], MD Visit Date: 03/04/2020 Occupation: @GUAROCC @  Subjective:  Neck stiffness   History of Present Illness: Tyler Norris is a 85 y.o. male with history of systemic lupus dermatosis.  He states he has been experiencing some stiffness in his neck for the last 2 days.  He denies any history of joint swelling.  There is no history of oral ulcers, nasal ulcers, malar rash, photosensitivity, sicca symptoms.  He notices Raynaud's phenomenon when he is exposed to colder temperatures.  Activities of Daily Living:  Patient reports morning stiffness for 0  minutes.   Patient Denies nocturnal pain.  Difficulty dressing/grooming: Denies Difficulty climbing stairs: Reports Difficulty getting out of chair: Denies Difficulty using hands for taps, buttons, cutlery, and/or writing: Denies  Review of Systems  Constitutional: Negative for fatigue and night sweats.  HENT: Positive for mouth dryness. Negative for mouth sores and nose dryness.   Eyes: Negative for pain, redness, itching and dryness.  Respiratory: Negative for shortness of breath and difficulty breathing.   Cardiovascular: Negative for chest pain, palpitations, hypertension, irregular heartbeat and swelling in legs/feet.  Gastrointestinal: Negative for blood in stool, constipation and diarrhea.  Endocrine: Negative for increased urination.  Genitourinary: Negative for difficulty urinating.  Musculoskeletal: Positive for arthralgias and joint pain. Negative for joint swelling, myalgias, muscle weakness, morning stiffness, muscle tenderness and myalgias.  Skin: Positive for color change. Negative for rash, hair loss, nodules/bumps, redness, skin tightness, ulcers and sensitivity to sunlight.  Allergic/Immunologic: Negative for susceptible to infections.   Neurological: Positive for memory loss. Negative for dizziness, fainting, numbness, headaches, night sweats and weakness ( ).  Hematological: Negative for bruising/bleeding tendency and swollen glands.  Psychiatric/Behavioral: Negative for depressed mood, confusion and sleep disturbance. The patient is not nervous/anxious.     PMFS History:  Patient Active Problem List   Diagnosis Date Noted  . SDH (subdural hematoma) ([place]) 01/08/2017  . Pleural effusion on left 06/02/2015  . Aneurysm of thoracic aorta ([place]) 06/02/2015  . SOB (shortness of breath) 08/22/2014  . Pericardial effusion 08/22/2014  . Lupus (systemic lupus erythematosus) ([place])   . 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 PE (pulmonary embolism) 06/05/2012  . Pleural effusion exudative 05/26/2012  . Essential hypertension 05/13/2012  . OSA on CPAP 05/13/2012  . Cardiomegaly 05/13/2012  . Abdominal aneurysm without mention of rupture 06/27/2011    Past Medical History:  Diagnosis Date  . AAA (abdominal aortic aneurysm) ([place])    a. resolved by ct scan 09-2011  . Anemia   . Arthritis   . Barrett's esophagus   . Carpal tunnel syndrome, bilateral   . Essential hypertension   . Fibromyalgia   . GERD (gastroesophageal reflux disease)   . History of pneumonia   . Hyperlipidemia   . Lupus (systemic lupus erythematosus) ([place])   . Nephrolithiasis   . Pericardial effusion    a. 08/22/2014 Echo: EF 60-65%, mildly dil Ao root (51mm), mildly dil LA, mod-large partially organized pericardial effusion with possible early tamponade - asymptomatic on high dose nsaids-->conservative mgmt;  b. 08/2014 Limited Echo: EF 55-60%, Triv - small effusion.  . Sleep apnea    a. uses cpap every night  Family History  Problem Relation Age of Onset  . Diabetes Mother   . Diabetes Sister   . Stroke Sister   . Healthy Daughter   . Healthy Son    Past Surgical History:  Procedure Laterality Date  . APPENDECTOMY    . CARPAL TUNNEL  RELEASE  10/12/2011   Procedure: CARPAL TUNNEL RELEASE;  Surgeon: Cammie Sickle., MD;  Location: [place];  Service: Orthopedics;  Laterality: Left;  . CARPAL TUNNEL RELEASE  12/01/2011   Procedure: CARPAL TUNNEL RELEASE;  Surgeon: Cammie Sickle., MD;  Location: [place];  Service: Orthopedics;  Laterality: Right;  . CYSTOSCOPY W/ STONE MANIPULATION  [id number]  . HAND SURGERY     scar repaired lt hand  . HERNIA REPAIR  1966  . KNEE ARTHROPLASTY    . KNEE ARTHROSCOPY Left   . TONSILLECTOMY     Social History   Social History Narrative  . Not on file   Immunization History  Administered Date(s) Administered  . Fluad Quad(high Dose 65+) 01/16/2019, 10/28/2019  . Influenza Nasal 11/28/2011  . Influenza Split 11/27/2013, 11/28/2014  . Influenza,inj,Quad PF,6+ Mos 11/11/2012, 10/29/2015  . Influenza-Unspecified 11/16/2014  . Moderna Sars-Covid-2 Vaccination 03/12/2019, 04/08/2019, 10/27/2019  . Pneumococcal Polysaccharide-23 05/15/2012  . Tdap 01/07/2017  . Zoster Recombinat (Shingrix) 01/16/2018, 03/17/2018     Objective: Vital Signs: BP (!) 156/75 (BP Location: Left Arm, Patient Position: Sitting, Cuff Size: Normal)   Pulse (!) 57   Resp 16   Ht 5' 9.5" (1.765 m)   Wt 214 lb (97.1 kg)   BMI 31.15 kg/m    Physical Exam Vitals and nursing note reviewed.  Constitutional:      Appearance: He is well-developed and well-nourished.  HENT:     Head: Normocephalic and atraumatic.  Eyes:     Extraocular Movements: EOM normal.     Conjunctiva/sclera: Conjunctivae normal.     Pupils: Pupils are equal, round, and reactive to light.  Cardiovascular:     Rate and Rhythm: Normal rate and regular rhythm.     Heart sounds: Normal heart sounds.  Pulmonary:     Effort: Pulmonary effort is normal.     Breath sounds: Normal breath sounds.  Abdominal:     General: Bowel sounds are normal.     Palpations: Abdomen is soft.  Musculoskeletal:      Cervical back: Normal range of motion and neck supple.  Skin:    General: Skin is warm and dry.     Capillary Refill: Capillary refill takes less than 2 seconds.  Neurological:     Mental Status: He is alert and oriented to person, place, and time.  Psychiatric:        Mood and Affect: Mood and affect normal.        Behavior: Behavior normal.      Musculoskeletal Exam: He had limited range of motion of cervical spine.  Shoulder joints, elbow joints, wrist joints with good range of motion.  He had bilateral second and third MCP thickening with some ulnar deviation.  No synovitis was noted.  PIP and DIP prominence was noted.  Hip joints and knee joints with good range of motion.  He had no tenderness over ankles or MTPs.  CDAI Exam: CDAI Score: -- Patient Global: --; Provider Global: -- Swollen: --; Tender: -- Joint Exam 03/04/2020   No joint exam has been documented for this visit   There is currently no information documented on  the homunculus. Go to the Rheumatology activity and complete the homunculus joint exam.  Investigation: No additional findings.  Imaging: No results found.  Recent Labs: Lab Results  Component Value Date   WBC 4.9 09/29/2019   HGB 12.8 (L) 09/29/2019   PLT 213 09/29/2019   NA 141 09/29/2019   K 3.9 09/29/2019   CL 105 09/29/2019   CO2 24 09/29/2019   GLUCOSE 188 (H) 09/29/2019   BUN 20 09/29/2019   CREATININE 1.02 09/29/2019   BILITOT 0.7 09/29/2019   ALKPHOS 43 08/22/2014   AST 15 09/29/2019   ALT 12 09/29/2019   PROT 6.4 09/29/2019   ALBUMIN 3.3 (L) 08/22/2014   CALCIUM 9.7 09/29/2019   GFRAA 78 09/29/2019    Speciality Comments: PLQ Eye Exam 02/02/2020 WNL @ [facility] Follow up in 1 year.  Procedures:  No procedures performed Allergies: Atorvastatin and Lipitor [atorvastatin calcium]   Assessment / Plan:     Visit Diagnoses: Other systemic lupus erythematosus with other organ involvement ([place]) - +ANA, +RF: -He  denies any history of oral ulcers, nasal ulcers, malar rash, photosensitivity or sicca symptoms.  He has mild Raynaud's symptoms.  He denies any recent episodes of joint swelling.  Serology has been negative.  We discussed decreasing Plaquenil to 200 mg p.o. twice daily Monday to Friday.  He was in agreement.  If he develops increased symptoms he can go back to previous dosing.  Plan: Anti-DNA antibody, double-stranded, C3 and C4, Sedimentation rate, Urinalysis, Routine w reflex microscopic  High risk medication use - Plaquenil 200 mg twice daily. PLQ Eye Exam 02/02/2020 - Plan: CBC with Differential/Platelet, COMPLETE METABOLIC PANEL WITH GFR today.  Neck pain-he woke up with the stiffness in his neck 2 days ago.  He had decreased range of motion without radiculopathy.  Have given him a handout on neck exercises.  Positive QuantiFERON-TB Gold test  History of pulmonary embolism  History of gastroesophageal reflux (GERD)  Other proteinuria-we will check UA today.  History of sleep apnea  History of pleural effusion  Essential hypertension-his systolic blood pressure is elevated.  Have advised him to monitor his blood pressure closely and follow-up with the PCP if it stays elevated.  OSA on CPAP  Thoracic aortic aneurysm without rupture ([place])  Orders: Orders Placed This Encounter  Procedures  . CBC with Differential/Platelet  . COMPLETE METABOLIC PANEL WITH GFR  . Anti-DNA antibody, double-stranded  . C3 and C4  . Sedimentation rate  . Urinalysis, Routine w reflex microscopic   No orders of the defined types were placed in this encounter.    Follow-Up Instructions: Return in about 5 months (around 08/02/2020) for Systemic lupus.   Bo Merino, MD  Note - This record has been created using Editor, commissioning.  Chart creation errors have been sought, but may not always  have been located. Such creation errors do not reflect on  the standard of medical care.

## 2020-02-25 ENCOUNTER — Other Ambulatory Visit: Payer: Self-pay | Admitting: Physician Assistant

## 2020-03-02 ENCOUNTER — Other Ambulatory Visit: Payer: Self-pay | Admitting: Physician Assistant

## 2020-03-03 ENCOUNTER — Ambulatory Visit (INDEPENDENT_AMBULATORY_CARE_PROVIDER_SITE_OTHER): Payer: Medicare Other | Admitting: Pulmonary Disease

## 2020-03-03 ENCOUNTER — Other Ambulatory Visit: Payer: Self-pay

## 2020-03-03 ENCOUNTER — Encounter: Payer: Self-pay | Admitting: Pulmonary Disease

## 2020-03-03 DIAGNOSIS — J90 Pleural effusion, not elsewhere classified: Secondary | ICD-10-CM

## 2020-03-03 DIAGNOSIS — G4733 Obstructive sleep apnea (adult) (pediatric): Secondary | ICD-10-CM

## 2020-03-03 DIAGNOSIS — Z9989 Dependence on other enabling machines and devices: Secondary | ICD-10-CM

## 2020-03-03 NOTE — Patient Instructions (Signed)
CPAP is working well Increase to auto settings 10-15 cm

## 2020-03-03 NOTE — Assessment & Plan Note (Signed)
Download was reviewed which shows excellent compliance and good control of events on auto settings 5 to 15 cm with average pressure of 13 cm, minimal leak. CPAP is certainly helped improve his somnolence and fatigue.  His current issues of endurance are seem to be more related to blood pressure medications then sleep pressure. We will increase his auto settings to 10 to 15 cm and hopefully this will solve his pressure problem.  He is used to [employer] and therefore requests higher pressure CPAP supplies will be renewed for a year

## 2020-03-03 NOTE — Addendum Note (Signed)
Addended by: Delrae Rend on: 03/03/2020 10:40 AM   Modules accepted: Orders

## 2020-03-03 NOTE — Progress Notes (Signed)
   Subjective:    Patient ID: KIET GEER, male    DOB: 05-05-1934, 86 y.o.   MRN: [id number]  HPI  103 yonever smoker with SLE diagnosed 2042for FU of OSA  Maintained on auto CPAP   Treated with INH for latent TB 12/2012 . Followed by Deveshwarfor SLE, on Plaquenil . Off prednisone. Pleural effusions resolved. H/o pulmonary embolism 05/2012 - finished coumadin course 02/2013   Annual follow-up, on his last office visit he was complaining of not getting enough air on a CPAP.  We changed him to auto settings 10 to 15 cm from previous settings of 5 to 15 cm, average pressure was 13 , however this change was never done. He still complains of less pressure on CPAP and wonders if this can be increased .  No problems with mask. He has developed balance issues and underwent rehab for vestibular problems and blood pressure meds were adjusted. He complains of decreased endurance and energy  Significant tests/ events reviewed  Thoracentesis 04/2012 of 230 mL of monocytic exudative fluid.   CT angiogram on 06/05/12 showed small subsegmental pulmonary emboli in the right upper lobe, right middle lobe and left lower lobe. A right effusion was noted somewhat loculated and a small left effusion and was layering.    Adm 07/2014 -Pericardial effusion with near tamponade, picked up on CT angio , resolved on FU 10/2014 - on NSAIDs   PFTs - 04/2014 - unchanged, FEV1 79%, FVC 78% improves to >80% with BDs, TLC 68%, DLCO 55%  4/2017He developed left pleural effusion and underwent thoracentesis -about 60 mL>>monocytic exudate as before>>plaquanil added  CT angiogram chest 09/2018 -left effusion resolved Less than 4 mm pulmonary nodules  Review of Systems Patient denies significant dyspnea,cough, hemoptysis,  chest pain, palpitations, pedal edema, orthopnea, paroxysmal nocturnal dyspnea, lightheadedness, nausea, vomiting, abdominal or  leg pains      Objective:   Physical Exam  Gen.  Pleasant, well-nourished, in no distress ENT - no thrush, no pallor/icterus,no post nasal drip Neck: No JVD, no thyromegaly, no carotid bruits Lungs: no use of accessory muscles, no dullness to percussion, clear without rales or rhonchi  Cardiovascular: Rhythm regular, heart sounds  normal, no murmurs or gallops, no peripheral edema Musculoskeletal: No deformities, no cyanosis or clubbing        Assessment & Plan:

## 2020-03-03 NOTE — Assessment & Plan Note (Signed)
Resolved on prior imaging and exam today

## 2020-03-04 ENCOUNTER — Ambulatory Visit (INDEPENDENT_AMBULATORY_CARE_PROVIDER_SITE_OTHER): Payer: Medicare Other | Admitting: Rheumatology

## 2020-03-04 ENCOUNTER — Encounter: Payer: Self-pay | Admitting: Rheumatology

## 2020-03-04 VITALS — BP 156/75 | HR 57 | Resp 16 | Ht 69.5 in | Wt 214.0 lb

## 2020-03-04 DIAGNOSIS — R7612 Nonspecific reaction to cell mediated immunity measurement of gamma interferon antigen response without active tuberculosis: Secondary | ICD-10-CM

## 2020-03-04 DIAGNOSIS — Z8669 Personal history of other diseases of the nervous system and sense organs: Secondary | ICD-10-CM | POA: Diagnosis not present

## 2020-03-04 DIAGNOSIS — Z86711 Personal history of pulmonary embolism: Secondary | ICD-10-CM | POA: Diagnosis not present

## 2020-03-04 DIAGNOSIS — G4733 Obstructive sleep apnea (adult) (pediatric): Secondary | ICD-10-CM | POA: Diagnosis not present

## 2020-03-04 DIAGNOSIS — R808 Other proteinuria: Secondary | ICD-10-CM

## 2020-03-04 DIAGNOSIS — M542 Cervicalgia: Secondary | ICD-10-CM | POA: Diagnosis not present

## 2020-03-04 DIAGNOSIS — Z9989 Dependence on other enabling machines and devices: Secondary | ICD-10-CM

## 2020-03-04 DIAGNOSIS — M3219 Other organ or system involvement in systemic lupus erythematosus: Secondary | ICD-10-CM | POA: Diagnosis not present

## 2020-03-04 DIAGNOSIS — Z8709 Personal history of other diseases of the respiratory system: Secondary | ICD-10-CM

## 2020-03-04 DIAGNOSIS — Z79899 Other long term (current) drug therapy: Secondary | ICD-10-CM | POA: Diagnosis not present

## 2020-03-04 DIAGNOSIS — Z8719 Personal history of other diseases of the digestive system: Secondary | ICD-10-CM | POA: Diagnosis not present

## 2020-03-04 DIAGNOSIS — I712 Thoracic aortic aneurysm, without rupture, unspecified: Secondary | ICD-10-CM

## 2020-03-04 DIAGNOSIS — I10 Essential (primary) hypertension: Secondary | ICD-10-CM

## 2020-03-04 NOTE — Patient Instructions (Signed)

## 2020-03-05 LAB — CBC WITH DIFFERENTIAL/PLATELET
Absolute Monocytes: 561 cells/uL (ref 200–950)
Basophils Absolute: 50 cells/uL (ref 0–200)
Basophils Relative: 0.9 %
Eosinophils Absolute: 83 cells/uL (ref 15–500)
Eosinophils Relative: 1.5 %
HCT: 38 % — ABNORMAL LOW (ref 38.5–50.0)
Hemoglobin: 13.1 g/dL — ABNORMAL LOW (ref 13.2–17.1)
Lymphs Abs: 1018 cells/uL (ref 850–3900)
MCH: 31.5 pg (ref 27.0–33.0)
MCHC: 34.5 g/dL (ref 32.0–36.0)
MCV: 91.3 fL (ref 80.0–100.0)
MPV: 10.7 fL (ref 7.5–12.5)
Monocytes Relative: 10.2 %
Neutro Abs: 3790 cells/uL (ref 1500–7800)
Neutrophils Relative %: 68.9 %
Platelets: 206 10*3/uL (ref 140–400)
RBC: 4.16 10*6/uL — ABNORMAL LOW (ref 4.20–5.80)
RDW: 11.6 % (ref 11.0–15.0)
Total Lymphocyte: 18.5 %
WBC: 5.5 10*3/uL (ref 3.8–10.8)

## 2020-03-05 LAB — COMPLETE METABOLIC PANEL WITH GFR
AG Ratio: 2.2 (calc) (ref 1.0–2.5)
ALT: 9 U/L (ref 9–46)
AST: 17 U/L (ref 10–35)
Albumin: 4.3 g/dL (ref 3.6–5.1)
Alkaline phosphatase (APISO): 30 U/L — ABNORMAL LOW (ref 35–144)
BUN: 21 mg/dL (ref 7–25)
CO2: 26 mmol/L (ref 20–32)
Calcium: 9.7 mg/dL (ref 8.6–10.3)
Chloride: 105 mmol/L (ref 98–110)
Creat: 0.94 mg/dL (ref 0.70–1.11)
GFR, Est African American: 87 mL/min/{1.73_m2} (ref >=60)
GFR, Est Non African American: 75 mL/min/{1.73_m2} (ref >=60)
Globulin: 2 g/dL (calc) (ref 1.9–3.7)
Glucose, Bld: 150 mg/dL — ABNORMAL HIGH (ref 65–99)
Potassium: 4.1 mmol/L (ref 3.5–5.3)
Sodium: 140 mmol/L (ref 135–146)
Total Bilirubin: 0.5 mg/dL (ref 0.2–1.2)
Total Protein: 6.3 g/dL (ref 6.1–8.1)

## 2020-03-05 LAB — SEDIMENTATION RATE: Sed Rate: 6 mm/h (ref 0–20)

## 2020-03-05 LAB — URINALYSIS, ROUTINE W REFLEX MICROSCOPIC
Bacteria, UA: NONE SEEN /HPF
Bilirubin Urine: NEGATIVE
Glucose, UA: NEGATIVE
Hgb urine dipstick: NEGATIVE
Hyaline Cast: NONE SEEN /LPF
Ketones, ur: NEGATIVE
Nitrite: NEGATIVE
Protein, ur: NEGATIVE
RBC / HPF: NONE SEEN /HPF (ref 0–2)
Specific Gravity, Urine: 1.017 (ref 1.001–1.03)
Squamous Epithelial / HPF: NONE SEEN /HPF (ref <=5)
WBC, UA: NONE SEEN /HPF (ref 0–5)
pH: 6.5 (ref 5.0–8.0)

## 2020-03-05 LAB — C3 AND C4
C3 Complement: 114 mg/dL
C4 Complement: 18 mg/dL

## 2020-03-05 LAB — ANTI-DNA ANTIBODY, DOUBLE-STRANDED: ds DNA Ab: 3 IU/mL

## 2020-03-05 NOTE — Progress Notes (Signed)
CBC showed mild anemia which is stable.  Glucose is elevated.  UA is negative.  Sed rate is normal.  Double-stranded DNA and complements are pending.

## 2020-03-05 NOTE — Progress Notes (Signed)
Ds DNA and complements are normal. Disease is not active .

## 2020-03-19 ENCOUNTER — Other Ambulatory Visit: Payer: Self-pay

## 2020-03-19 MED ORDER — HYDROXYCHLOROQUINE SULFATE 200 MG PO TABS: ORAL_TABLET | ORAL | 0 refills | Status: DC

## 2020-03-19 NOTE — Telephone Encounter (Signed)
Last Visit: 03/04/2020 Next Visit: 08/03/2020 Labs: 03/04/2020, CBC showed mild anemia which is stable. Glucose is elevated. UA is negative. Sed rate is normal, Ds DNA and complements are normal Eye exam: 12/6/202 WNL  Current Dose per office note 03/04/2020, Plaquenil 200 mg twice daily DX: Other systemic lupus erythematosus with other organ involvement  Okay to refill Plaquenil?

## 2020-03-19 NOTE — Telephone Encounter (Signed)
Patient called requesting prescription refill of Plaquenil to be sent to [facility].

## 2020-04-07 ENCOUNTER — Telehealth: Payer: Self-pay | Admitting: *Deleted

## 2020-04-07 NOTE — Telephone Encounter (Signed)
Office notes, lab order and appeal letter faxed to Medicare Appeal [phone] with message below per patient's request.  MEDICARE APPEAL #[id number], Please review to reconsider payment of Vitamin D drawn 05/20/2018 per patient's request. Thank you.

## 2020-04-13 DIAGNOSIS — L309 Dermatitis, unspecified: Secondary | ICD-10-CM | POA: Diagnosis not present

## 2020-06-15 DIAGNOSIS — I129 Hypertensive chronic kidney disease with stage 1 through stage 4 chronic kidney disease, or unspecified chronic kidney disease: Secondary | ICD-10-CM | POA: Diagnosis not present

## 2020-06-15 DIAGNOSIS — N182 Chronic kidney disease, stage 2 (mild): Secondary | ICD-10-CM | POA: Diagnosis not present

## 2020-06-15 DIAGNOSIS — M329 Systemic lupus erythematosus, unspecified: Secondary | ICD-10-CM | POA: Diagnosis not present

## 2020-06-15 DIAGNOSIS — E785 Hyperlipidemia, unspecified: Secondary | ICD-10-CM | POA: Diagnosis not present

## 2020-06-15 DIAGNOSIS — D631 Anemia in chronic kidney disease: Secondary | ICD-10-CM | POA: Diagnosis not present

## 2020-06-15 DIAGNOSIS — R809 Proteinuria, unspecified: Secondary | ICD-10-CM | POA: Diagnosis not present

## 2020-06-15 DIAGNOSIS — N2581 Secondary hyperparathyroidism of renal origin: Secondary | ICD-10-CM | POA: Diagnosis not present

## 2020-06-18 ENCOUNTER — Telehealth: Payer: Self-pay | Admitting: *Deleted

## 2020-06-18 NOTE — Telephone Encounter (Addendum)
Labs received from: [facility] on: 06/15/2020 Reviewed by: Hazel Sams, PA-C  Labs drawn: Renal function panel, Urinalysis, CBC w/Diff, FerriBC, Ferritin  Results:  Glucose    231 BUN/Creatinine Ration  26 Phosphorus   2.6 RBC    4.07 Hemoglobin   12.6 Ferritin    1243  Patient advised to stop NSAIDS.

## 2020-06-21 DIAGNOSIS — Z23 Encounter for immunization: Secondary | ICD-10-CM | POA: Diagnosis not present

## 2020-06-22 ENCOUNTER — Other Ambulatory Visit: Payer: Self-pay

## 2020-06-22 DIAGNOSIS — R109 Unspecified abdominal pain: Secondary | ICD-10-CM | POA: Diagnosis not present

## 2020-06-22 MED ORDER — HYDROXYCHLOROQUINE SULFATE 200 MG PO TABS: ORAL_TABLET | ORAL | 0 refills | Status: DC

## 2020-06-22 NOTE — Telephone Encounter (Signed)
Last Visit: 03/04/2020 Next Visit: 08/03/2020 Labs: 03/04/2020, CBC showed mild anemia which is stable. Glucose is elevated. UA is negative. Sed rate is normal. Ds DNA and complements are normal. Disease is not active .  Eye exam: 02/02/2020  Current Dose per office note 03/04/2020, Plaquenil to 200 mg p.o. twice daily Monday to Friday  DX: Other systemic lupus erythematosus with other organ involvement  Last Fill: 03/19/2020  Okay to refill Plaquenil?

## 2020-06-22 NOTE — Telephone Encounter (Signed)
Patient called stating he forgot to call for his prescription refill of Plaquenil.  Patient states he only has 1 tablet remaining.  Patient states he usually gets them filled at [facility], but needs a partial refill sent to his local pharmacy [facility] at [address]  Patient requested a return call.

## 2020-06-28 DIAGNOSIS — K760 Fatty (change of) liver, not elsewhere classified: Secondary | ICD-10-CM | POA: Diagnosis not present

## 2020-06-28 DIAGNOSIS — R109 Unspecified abdominal pain: Secondary | ICD-10-CM | POA: Diagnosis not present

## 2020-06-28 DIAGNOSIS — R935 Abnormal findings on diagnostic imaging of other abdominal regions, including retroperitoneum: Secondary | ICD-10-CM | POA: Diagnosis not present

## 2020-06-28 DIAGNOSIS — N281 Cyst of kidney, acquired: Secondary | ICD-10-CM | POA: Diagnosis not present

## 2020-07-02 DIAGNOSIS — R109 Unspecified abdominal pain: Secondary | ICD-10-CM | POA: Diagnosis not present

## 2020-07-02 DIAGNOSIS — R059 Cough, unspecified: Secondary | ICD-10-CM | POA: Diagnosis not present

## 2020-07-05 ENCOUNTER — Other Ambulatory Visit: Payer: Self-pay | Admitting: Family Medicine

## 2020-07-05 DIAGNOSIS — R109 Unspecified abdominal pain: Secondary | ICD-10-CM

## 2020-07-05 DIAGNOSIS — R059 Cough, unspecified: Secondary | ICD-10-CM

## 2020-07-07 ENCOUNTER — Encounter (HOSPITAL_COMMUNITY): Payer: Self-pay | Admitting: *Deleted

## 2020-07-07 ENCOUNTER — Inpatient Hospital Stay (HOSPITAL_COMMUNITY): Payer: Medicare Other

## 2020-07-07 ENCOUNTER — Emergency Department (HOSPITAL_COMMUNITY): Payer: Medicare Other

## 2020-07-07 ENCOUNTER — Other Ambulatory Visit: Payer: Self-pay

## 2020-07-07 ENCOUNTER — Inpatient Hospital Stay (HOSPITAL_COMMUNITY)
Admission: EM | Admit: 2020-07-07 | Discharge: 2020-07-10 | DRG: 066 | Disposition: A | Discharge status: Home Health | Payer: Medicare Other | Attending: Internal Medicine | Admitting: Internal Medicine

## 2020-07-07 ENCOUNTER — Other Ambulatory Visit: Payer: Medicare Other

## 2020-07-07 DIAGNOSIS — D649 Anemia, unspecified: Secondary | ICD-10-CM | POA: Diagnosis present

## 2020-07-07 DIAGNOSIS — Z6831 Body mass index (BMI) 31.0-31.9, adult: Secondary | ICD-10-CM

## 2020-07-07 DIAGNOSIS — R29702 NIHSS score 2: Secondary | ICD-10-CM | POA: Diagnosis present

## 2020-07-07 DIAGNOSIS — K219 Gastro-esophageal reflux disease without esophagitis: Secondary | ICD-10-CM | POA: Diagnosis present

## 2020-07-07 DIAGNOSIS — I708 Atherosclerosis of other arteries: Secondary | ICD-10-CM | POA: Diagnosis not present

## 2020-07-07 DIAGNOSIS — M329 Systemic lupus erythematosus, unspecified: Secondary | ICD-10-CM | POA: Diagnosis present

## 2020-07-07 DIAGNOSIS — H53469 Homonymous bilateral field defects, unspecified side: Secondary | ICD-10-CM | POA: Diagnosis present

## 2020-07-07 DIAGNOSIS — I6523 Occlusion and stenosis of bilateral carotid arteries: Secondary | ICD-10-CM | POA: Diagnosis not present

## 2020-07-07 DIAGNOSIS — Z888 Allergy status to other drugs, medicaments and biological substances status: Secondary | ICD-10-CM | POA: Diagnosis not present

## 2020-07-07 DIAGNOSIS — I63431 Cerebral infarction due to embolism of right posterior cerebral artery: Secondary | ICD-10-CM | POA: Diagnosis not present

## 2020-07-07 DIAGNOSIS — H538 Other visual disturbances: Secondary | ICD-10-CM | POA: Diagnosis present

## 2020-07-07 DIAGNOSIS — I10 Essential (primary) hypertension: Secondary | ICD-10-CM | POA: Diagnosis not present

## 2020-07-07 DIAGNOSIS — Z7982 Long term (current) use of aspirin: Secondary | ICD-10-CM

## 2020-07-07 DIAGNOSIS — I714 Abdominal aortic aneurysm, without rupture: Secondary | ICD-10-CM | POA: Diagnosis present

## 2020-07-07 DIAGNOSIS — Z8673 Personal history of transient ischemic attack (TIA), and cerebral infarction without residual deficits: Secondary | ICD-10-CM

## 2020-07-07 DIAGNOSIS — R482 Apraxia: Secondary | ICD-10-CM | POA: Diagnosis present

## 2020-07-07 DIAGNOSIS — I6503 Occlusion and stenosis of bilateral vertebral arteries: Secondary | ICD-10-CM | POA: Diagnosis not present

## 2020-07-07 DIAGNOSIS — R1011 Right upper quadrant pain: Secondary | ICD-10-CM | POA: Diagnosis present

## 2020-07-07 DIAGNOSIS — I119 Hypertensive heart disease without heart failure: Secondary | ICD-10-CM | POA: Diagnosis present

## 2020-07-07 DIAGNOSIS — Z20822 Contact with and (suspected) exposure to covid-19: Secondary | ICD-10-CM | POA: Diagnosis present

## 2020-07-07 DIAGNOSIS — E785 Hyperlipidemia, unspecified: Secondary | ICD-10-CM | POA: Diagnosis not present

## 2020-07-07 DIAGNOSIS — M1990 Unspecified osteoarthritis, unspecified site: Secondary | ICD-10-CM | POA: Diagnosis present

## 2020-07-07 DIAGNOSIS — Z833 Family history of diabetes mellitus: Secondary | ICD-10-CM | POA: Diagnosis not present

## 2020-07-07 DIAGNOSIS — G319 Degenerative disease of nervous system, unspecified: Secondary | ICD-10-CM | POA: Diagnosis not present

## 2020-07-07 DIAGNOSIS — I6389 Other cerebral infarction: Secondary | ICD-10-CM | POA: Diagnosis not present

## 2020-07-07 DIAGNOSIS — Z87442 Personal history of urinary calculi: Secondary | ICD-10-CM | POA: Diagnosis not present

## 2020-07-07 DIAGNOSIS — I771 Stricture of artery: Secondary | ICD-10-CM | POA: Diagnosis not present

## 2020-07-07 DIAGNOSIS — Z7984 Long term (current) use of oral hypoglycemic drugs: Secondary | ICD-10-CM | POA: Diagnosis not present

## 2020-07-07 DIAGNOSIS — M797 Fibromyalgia: Secondary | ICD-10-CM | POA: Diagnosis not present

## 2020-07-07 DIAGNOSIS — Z79899 Other long term (current) drug therapy: Secondary | ICD-10-CM | POA: Diagnosis not present

## 2020-07-07 DIAGNOSIS — I672 Cerebral atherosclerosis: Secondary | ICD-10-CM | POA: Diagnosis not present

## 2020-07-07 DIAGNOSIS — G4733 Obstructive sleep apnea (adult) (pediatric): Secondary | ICD-10-CM | POA: Diagnosis present

## 2020-07-07 DIAGNOSIS — Z823 Family history of stroke: Secondary | ICD-10-CM

## 2020-07-07 DIAGNOSIS — E669 Obesity, unspecified: Secondary | ICD-10-CM | POA: Diagnosis present

## 2020-07-07 DIAGNOSIS — I639 Cerebral infarction, unspecified: Secondary | ICD-10-CM | POA: Diagnosis present

## 2020-07-07 DIAGNOSIS — E11649 Type 2 diabetes mellitus with hypoglycemia without coma: Secondary | ICD-10-CM | POA: Diagnosis present

## 2020-07-07 DIAGNOSIS — Z96652 Presence of left artificial knee joint: Secondary | ICD-10-CM | POA: Diagnosis present

## 2020-07-07 DIAGNOSIS — H5347 Heteronymous bilateral field defects: Secondary | ICD-10-CM | POA: Diagnosis not present

## 2020-07-07 DIAGNOSIS — I63531 Cerebral infarction due to unspecified occlusion or stenosis of right posterior cerebral artery: Secondary | ICD-10-CM | POA: Diagnosis not present

## 2020-07-07 LAB — URINALYSIS, ROUTINE W REFLEX MICROSCOPIC
Bilirubin Urine: NEGATIVE
Glucose, UA: NEGATIVE mg/dL
Hgb urine dipstick: NEGATIVE
Ketones, ur: NEGATIVE mg/dL
Leukocytes,Ua: NEGATIVE
Nitrite: NEGATIVE
Protein, ur: NEGATIVE mg/dL
Specific Gravity, Urine: 1.012 (ref 1.005–1.030)
pH: 6 (ref 5.0–8.0)

## 2020-07-07 LAB — I-STAT CHEM 8, ED
BUN: 39 mg/dL — ABNORMAL HIGH (ref 8–23)
Calcium, Ion: 1.3 mmol/L (ref 1.15–1.40)
Chloride: 103 mmol/L (ref 98–111)
Creatinine, Ser: 0.9 mg/dL (ref 0.61–1.24)
Glucose, Bld: 113 mg/dL — ABNORMAL HIGH (ref 70–99)
HCT: 36 % — ABNORMAL LOW (ref 39.0–52.0)
Hemoglobin: 12.2 g/dL — ABNORMAL LOW (ref 13.0–17.0)
Potassium: 4.9 mmol/L (ref 3.5–5.1)
Sodium: 139 mmol/L (ref 135–145)
TCO2: 26 mmol/L (ref 22–32)

## 2020-07-07 LAB — COMPREHENSIVE METABOLIC PANEL
ALT: 15 U/L (ref 0–44)
AST: 19 U/L (ref 15–41)
Albumin: 4.5 g/dL (ref 3.5–5.0)
Alkaline Phosphatase: 33 U/L — ABNORMAL LOW (ref 38–126)
Anion gap: 9 (ref 5–15)
BUN: 30 mg/dL — ABNORMAL HIGH (ref 8–23)
CO2: 26 mmol/L (ref 22–32)
Calcium: 10 mg/dL (ref 8.9–10.3)
Chloride: 104 mmol/L (ref 98–111)
Creatinine, Ser: 1.01 mg/dL (ref 0.61–1.24)
GFR, Estimated: >60 mL/min (ref >60)
Glucose, Bld: 118 mg/dL — ABNORMAL HIGH (ref 70–99)
Potassium: 3.9 mmol/L (ref 3.5–5.1)
Sodium: 139 mmol/L (ref 135–145)
Total Bilirubin: 0.8 mg/dL (ref 0.3–1.2)
Total Protein: 7.1 g/dL (ref 6.5–8.1)

## 2020-07-07 LAB — DIFFERENTIAL
Abs Immature Granulocytes: 0.04 10*3/uL (ref 0.00–0.07)
Basophils Absolute: 0 10*3/uL (ref 0.0–0.1)
Basophils Relative: 1 %
Eosinophils Absolute: 0.1 10*3/uL (ref 0.0–0.5)
Eosinophils Relative: 2 %
Immature Granulocytes: 1 %
Lymphocytes Relative: 17 %
Lymphs Abs: 1.2 10*3/uL (ref 0.7–4.0)
Monocytes Absolute: 0.7 10*3/uL (ref 0.1–1.0)
Monocytes Relative: 10 %
Neutro Abs: 5.1 10*3/uL (ref 1.7–7.7)
Neutrophils Relative %: 69 %

## 2020-07-07 LAB — CBC
HCT: 38.4 % — ABNORMAL LOW (ref 39.0–52.0)
Hemoglobin: 12.9 g/dL — ABNORMAL LOW (ref 13.0–17.0)
MCH: 31.2 pg (ref 26.0–34.0)
MCHC: 33.6 g/dL (ref 30.0–36.0)
MCV: 92.8 fL (ref 80.0–100.0)
Platelets: 197 10*3/uL (ref 150–400)
RBC: 4.14 MIL/uL — ABNORMAL LOW (ref 4.22–5.81)
RDW: 12.2 % (ref 11.5–15.5)
WBC: 7.2 10*3/uL (ref 4.0–10.5)
nRBC: 0 % (ref 0.0–0.2)

## 2020-07-07 LAB — RAPID URINE DRUG SCREEN, HOSP PERFORMED
Amphetamines: NOT DETECTED
Barbiturates: NOT DETECTED
Benzodiazepines: NOT DETECTED
Cocaine: NOT DETECTED
Opiates: NOT DETECTED
Tetrahydrocannabinol: NOT DETECTED

## 2020-07-07 LAB — APTT: aPTT: 29 seconds (ref 24–36)

## 2020-07-07 LAB — RESP PANEL BY RT-PCR (FLU A&B, COVID) ARPGX2
Influenza A by PCR: NEGATIVE
Influenza B by PCR: NEGATIVE
SARS Coronavirus 2 by RT PCR: NEGATIVE

## 2020-07-07 LAB — PROTIME-INR
INR: 1.1 (ref 0.8–1.2)
Prothrombin Time: 14.1 seconds (ref 11.4–15.2)

## 2020-07-07 MED ORDER — SODIUM CHLORIDE 0.9 % IV SOLN
100.0000 mL/h | INTRAVENOUS | Status: DC
  Administered 2020-07-07 – 2020-07-08 (×4): 100 mL/h via INTRAVENOUS

## 2020-07-07 MED ORDER — ACETAMINOPHEN 325 MG PO TABS
650.0000 mg | ORAL_TABLET | ORAL | Status: DC | PRN
  Administered 2020-07-08: 650 mg via ORAL
  Filled 2020-07-07: qty 2

## 2020-07-07 MED ORDER — SENNOSIDES-DOCUSATE SODIUM 8.6-50 MG PO TABS: 1.0000 | ORAL_TABLET | Freq: Every evening | ORAL | Status: DC | PRN

## 2020-07-07 MED ORDER — ACETAMINOPHEN 160 MG/5ML PO SOLN: 650.0000 mg | ORAL | Status: DC | PRN

## 2020-07-07 MED ORDER — STROKE: EARLY STAGES OF RECOVERY BOOK: Freq: Once | Status: AC

## 2020-07-07 MED ORDER — SODIUM CHLORIDE 0.9 % IV BOLUS
500.0000 mL | Freq: Once | INTRAVENOUS | Status: AC
  Administered 2020-07-07: 500 mL via INTRAVENOUS

## 2020-07-07 MED ORDER — ACETAMINOPHEN 650 MG RE SUPP
650.0000 mg | RECTAL | Status: DC | PRN
Start: 2020-07-07 — End: 2020-07-10

## 2020-07-07 MED ORDER — IOHEXOL 350 MG/ML SOLN
50.0000 mL | Freq: Once | INTRAVENOUS | Status: AC | PRN
  Administered 2020-07-07: 50 mL via INTRAVENOUS

## 2020-07-07 NOTE — ED Notes (Signed)
Called report to  [facility]  

## 2020-07-07 NOTE — ED Notes (Signed)
Patient transported to CT 

## 2020-07-07 NOTE — Consult Note (Signed)
NEUROLOGY TELECONSULTATION NOTE   Date of service: Jul 07, 2020 Patient Name: Tyler Norris MRN:  [id number] DOB:  [birth year] Reason for consult: acute onset lateral hemifield VF deficit in L eye only associated with feeling off balance and some motor apraxia. _ _ _   _ __   _ __ _ _  __ __   _ __   __ _  History of Present Illness   Exam was performed over video conference with assistance of bedside RN Vicente Males and [facility] RN Mardene Celeste  86 yo with hx AAA, HTN, HL, SLE, OSA presented to [facility] ED after acute onset lateral hemifield VF deficit in L eye only associated with feeling off balance and some motor apraxia. LKW 0830, sx were present by 0930 this AM. No focal weakness or numbness. No VF deficit in R eye. He felt disoriented and also urinated without realizing it. Pt is not on anticoagulation. Takes daily ASA 81mg  daily. Has never had a similar event before. No prior hx stroke or seizure.  CTH showed possible area of decreased attenuation in R occipital lobe c/f acute infarct. R cerebellar encephalomalacia is chronic. No blood. (personal review). tPA not administered 2/2 presenting outside the window. NIHSS = 2 and exam not c/w LVO therefore CTA/CTP not performed as part of the stroke code. Patient to be admitted to [facility] hospitalist service for stroke w/u.   ROS   Negative except as per HPI  Past History   Past Medical History:  Diagnosis Date  . AAA (abdominal aortic aneurysm) ([place])    a. resolved by ct scan 09-2011  . Anemia   . Arthritis   . Barrett's esophagus   . Carpal tunnel syndrome, bilateral   . Essential hypertension   . Fibromyalgia   . GERD (gastroesophageal reflux disease)   . History of pneumonia   . Hyperlipidemia   . Lupus (systemic lupus erythematosus) ([place])   . Nephrolithiasis   . Pericardial effusion    a. 08/22/2014 Echo: EF 60-65%, mildly dil Ao root (68mm), mildly dil LA, mod-large partially organized pericardial effusion with possible early  tamponade - asymptomatic on high dose nsaids-->conservative mgmt;  b. 08/2014 Limited Echo: EF 55-60%, Triv - small effusion.  . Sleep apnea    a. uses cpap every night   Past Surgical History:  Procedure Laterality Date  . APPENDECTOMY    . CARPAL TUNNEL RELEASE  10/12/2011   Procedure: CARPAL TUNNEL RELEASE;  Surgeon: Cammie Sickle., MD;  Location: [place];  Service: Orthopedics;  Laterality: Left;  . CARPAL TUNNEL RELEASE  12/01/2011   Procedure: CARPAL TUNNEL RELEASE;  Surgeon: Cammie Sickle., MD;  Location: [place];  Service: Orthopedics;  Laterality: Right;  . CYSTOSCOPY W/ STONE MANIPULATION  [id number]  . HAND SURGERY     scar repaired lt hand  . HERNIA REPAIR  1966  . KNEE ARTHROPLASTY    . KNEE ARTHROSCOPY Left   . TONSILLECTOMY     Family History  Problem Relation Age of Onset  . Diabetes Mother   . Diabetes Sister   . Stroke Sister   . Healthy Daughter   . Healthy Son    Social History   Socioeconomic History  . Marital status: Married    Spouse name: Not on file  . Number of children: Not on file  . Years of education: Not on file  . Highest education level: Not on file  Occupational History  .  Not on file  Tobacco Use  . Smoking status: Never Smoker  . Smokeless tobacco: Never Used  Vaping Use  . Vaping Use: Never used  Substance and Sexual Activity  . Alcohol use: Yes    Comment: once every 3 months  . Drug use: Never  . Sexual activity: Not on file  Other Topics Concern  . Not on file  Social History Narrative  . Not on file   Social Determinants of Health   Financial Resource Strain: Not on file  Food Insecurity: Not on file  Transportation Needs: Not on file  Physical Activity: Not on file  Stress: Not on file  Social Connections: Not on file   Allergies  Allergen Reactions  . Atorvastatin Other (See Comments)    Myalgias  . Lipitor [Atorvastatin Calcium]     Myalgias    Medications    (Not in a hospital admission)    Vitals   Vitals:   07/07/20 1442 07/07/20 1445 07/07/20 1503 07/07/20 1540  BP: 139/80 139/80 134/80 (!) 165/81  Pulse: 69 67 71 62  Resp: 17 (!) 22 17 17   Temp: 97.6 F (36.4 C)     TempSrc: Oral     SpO2: 99% 96% 95% 99%     There is no height or weight on file to calculate BMI.  Physical Exam   Exam was performed over video conference with assistance of bedside RN Vicente Males and [facility] RN Mardene Celeste  Physical Exam Gen: A&O x4, NAD Resp: normal WOB CV: Extremities appear well-perfused  Neuro: *MS: A&O x4. Follows multi-step commands.  *Speech: minimal dysarthria, no aphasia, able to name and repeat *CN: PERRL, VFF by confrontation except for lateral hemifield (upper and lower quadrant) of L eye only, EOMI, sensation intact, smile symmetric, hearing intact to voice.  *Motor: Full strength with no drift in any extremity *Sensory: SILT *Coordination: FNF and HTS intact bilat  NIHSS  1a Level of Conscious.: 0 1b LOC Questions: 0 1c LOC Commands: 0 2 Best Gaze: 0 3 Visual: 1 4 Facial Palsy: 0 5a Motor Arm - left: 0 5b Motor Arm - Right: 0 6a Motor Leg - Left: 0 6b Motor Leg - Right: 0 7 Limb Ataxia: 0 8 Sensory: 0 9 Best Language: 0 10 Dysarthria: 1 11 Extinct. and Inatten.: 0  TOTAL: 2   Premorbid mRS = 0    Labs   CBC:  Recent Labs  Lab 07/07/20 1549  HGB 12.2*  HCT 36.0*    Basic Metabolic Panel:  Lab Results  Component Value Date   NA 139 07/07/2020   K 4.9 07/07/2020   CO2 26 03/04/2020   GLUCOSE 113 (H) 07/07/2020   BUN 39 (H) 07/07/2020   CREATININE 0.90 07/07/2020   CALCIUM 9.7 03/04/2020   GFRNONAA 75 03/04/2020   GFRAA 87 03/04/2020   Lipid Panel: No results found for: LDLCALC HgbA1c: No results found for: HGBA1C Urine Drug Screen: No results found for: LABOPIA, COCAINSCRNUR, LABBENZ, AMPHETMU, THCU, LABBARB  Alcohol Level No results found for: [place]   Impression   86 yo with hx  AAA, HTN, HL, SLE, OSA presented to [facility] ED after acute onset lateral hemifield VF deficit in L eye only associated with feeling off balance and some motor apraxia. CTH showed hypodensity in R occipital lobe which would be expected to cause a homonymous hemianopsia, not a monocular hemianopsia. Exam to be confirmed by in-house neurologist exam after patient transfers to [facility] for stroke w/u.  Recommendations   -  Transfer to [facility] hospitalist service for stroke w/u. Notify neurology consult service upon patient's arrival - Permissive HTN x48 hrs from sx onset or until stroke ruled out by MRI goal BP <220/110. PRN labetalol or hydralazine if BP above these parameters. Avoid oral antihypertensives. - MRI brain wo contrast - CTA/MRA H&N - TTE  - Check A1c and LDL + add statin per guidelines - Continue ASA 81mg  daily + add plavix 75mg  daily - q4 hr neuro checks - STAT head CT for any change in neuro exam - Tele - PT/OT/SLP - Stroke education - Amb referral to neurology upon discharge   ______________________________________________________________________   Thank you for the opportunity to take part in the care of this patient. If you have any further questions, please contact the neurology consultation attending.  Signed,  Su Monks, MD [facility] [phone]  If 7pm- 7am, please page neurology on call as listed in [place].

## 2020-07-07 NOTE — ED Notes (Signed)
Attempted to call report to 3W at [facility]. Charge RN is looking over pt placement now

## 2020-07-07 NOTE — ED Notes (Signed)
Pt sit to stand with one person assist to use urinal

## 2020-07-07 NOTE — ED Notes (Signed)
Code Stroke called per Dr. Vanita Panda

## 2020-07-07 NOTE — ED Notes (Signed)
Called report to Aaron Edelman, Therapist, sports at [facility]

## 2020-07-07 NOTE — H&P (Signed)
History and Physical    Tyler Norris HYQ:[id number] DOB: 07-01-1934 DOA: 07/07/2020  PCP: [name], MD  Patient coming from: Home  Chief Complaint: vision loss  HPI: Tyler Norris is a 86 y.o. male with medical history significant of lupus, HTN, HLD. Presenting with left lateral visual field loss. He was in his normal state of health until this morning. He woke up to go to the bathroom. While in the bathroom, he noticed that he left lateral visual field went out. He became dizzy and had to balance himself on the wall. He tried to talk out of the bathroom and down the hallway. He found this to be difficult. He began feeling disoriented and wasn't sure where he was. He called for his daughter who came and brought him to the ED for evaluation. He denies any other aggravating or alleviating factors.    ED Course: Teleneurology was consulted. CTH showed "Geographic area of decreased attenuation identified in the medial aspect of the right occipital lobe consistent with acute infarct." TRH was called for admission.   Review of Systems:  Denies any CP, palpitations, dyspnea, syncopal episodes, N/V/D, fever, paraesthesias, paralysis, difficulty with speech.  Review of systems is otherwise negative for all not mentioned in HPI.   PMHx Past Medical History:  Diagnosis Date  . AAA (abdominal aortic aneurysm) ([place])    a. resolved by ct scan 09-2011  . Anemia   . Arthritis   . Barrett's esophagus   . Carpal tunnel syndrome, bilateral   . Essential hypertension   . Fibromyalgia   . GERD (gastroesophageal reflux disease)   . History of pneumonia   . Hyperlipidemia   . Lupus (systemic lupus erythematosus) ([place])   . Nephrolithiasis   . Pericardial effusion    a. 08/22/2014 Echo: EF 60-65%, mildly dil Ao root (64mm), mildly dil LA, mod-large partially organized pericardial effusion with possible early tamponade - asymptomatic on high dose nsaids-->conservative mgmt;  b. 08/2014 Limited  Echo: EF 55-60%, Triv - small effusion.  . Sleep apnea    a. uses cpap every night    PSHx Past Surgical History:  Procedure Laterality Date  . APPENDECTOMY    . CARPAL TUNNEL RELEASE  10/12/2011   Procedure: CARPAL TUNNEL RELEASE;  Surgeon: Cammie Sickle., MD;  Location: [facility];  Service: Orthopedics;  Laterality: Left;  . CARPAL TUNNEL RELEASE  12/01/2011   Procedure: CARPAL TUNNEL RELEASE;  Surgeon: Cammie Sickle., MD;  Location: [facility];  Service: Orthopedics;  Laterality: Right;  . CYSTOSCOPY W/ STONE MANIPULATION  [id number]  . HAND SURGERY     scar repaired lt hand  . HERNIA REPAIR  1966  . KNEE ARTHROPLASTY    . KNEE ARTHROSCOPY Left   . TONSILLECTOMY      SocHx  reports that he has never smoked. He has never used smokeless tobacco. He reports current alcohol use. He reports that he does not use drugs.  Allergies  Allergen Reactions  . Atorvastatin Other (See Comments)    Myalgias  . Lipitor [Atorvastatin Calcium]     Myalgias    FamHx Family History  Problem Relation Age of Onset  . Diabetes Mother   . Diabetes Sister   . Stroke Sister   . Healthy Daughter   . Healthy Son     Prior to Admission medications   Medication Sig Start Date End Date Taking? Authorizing Provider  acyclovir (ZOVIRAX) 400 MG tablet 2 (two)  times daily. 04/09/17   [provider]  amLODipine (NORVASC) 5 MG tablet Take 1 tablet (5 mg total) daily by mouth. 01/11/17   Meuth, Brooke A, PA-C  aspirin 81 MG tablet Take 81 mg by mouth daily.    [provider]  Bioflavonoid Products (BIOFLEX PO) Take 2 tablets by mouth daily.     [provider]  CALCIUM PO Take 1 tablet daily by mouth.    [provider]  Cholecalciferol (VITAMIN D3) 5000 UNITS CAPS Take 5,000 Units by mouth daily.    [provider]  fenofibrate (TRICOR) 145 MG tablet Take 145 mg by mouth daily.    [provider]  ferrous  sulfate 325 (65 FE) MG tablet Take 325 mg by mouth as needed.    [provider]  folic acid (FOLVITE) 737 MCG tablet Take 400 mcg by mouth daily.    [provider]  hydroxychloroquine (PLAQUENIL) 200 MG tablet Take one tablet by mouth twice daily Monday-Friday only. 06/22/20   Ofilia Neas, PA-C  magnesium oxide (MAG-OX) 400 (241.3 MG) MG tablet Take 1 tablet (400 mg total) by mouth 3 (three) times daily. 09/02/14   Theora Gianotti, NP  metFORMIN (GLUCOPHAGE) 500 MG tablet Take 500 mg by mouth daily. 02/26/20   [provider]  metoprolol tartrate (LOPRESSOR) 25 MG tablet Take 0.5 tablets (12.5 mg total) 2 (two) times daily by mouth. 01/11/17   Meuth, Brooke A, PA-C  Multiple Vitamin (MULTIVITAMIN WITH MINERALS) TABS Take 1 tablet by mouth daily.    [provider]  Omega-3 Fatty Acids (FISH OIL) 1200 MG CAPS Take 1,200 mg by mouth daily.     [provider]  pantoprazole (PROTONIX) 40 MG tablet Take 40 mg by mouth daily.  03/09/18   [provider]  rosuvastatin (CRESTOR) 5 MG tablet Take 2.5 mg by mouth daily.     [provider]  telmisartan-hydrochlorothiazide (MICARDIS HCT) 80-12.5 MG tablet Take 1 tablet by mouth daily.    [provider]  vitamin C (ASCORBIC ACID) 500 MG tablet Take 1,000 mg by mouth daily.     [provider]  vitamin E 400 UNIT capsule Take 400 Units by mouth daily.    [provider]    Physical Exam: Vitals:   07/07/20 1445 07/07/20 1503 07/07/20 1540 07/07/20 1600  BP: 139/80 134/80 (!) 165/81 (!) 168/91  Pulse: 67 71 62 64  Resp: (!) 22 17 17 16   Temp:      TempSrc:      SpO2: 96% 95% 99% 98%    General: 86 y.o. male resting in bed in NAD Eyes: PERRL, normal sclera ENMT: Nares patent w/o discharge, orophaynx clear, dentition normal, ears w/o discharge/lesions/ulcers Neck: Supple, trachea midline Cardiovascular: RRR, +S1, S2, no m/g/r, equal pulses  throughout Respiratory: CTABL, no w/r/r, normal WOB GI: BS+, NDNT, no masses noted, no organomegaly noted MSK: No e/c/c Skin: No rashes, bruises, ulcerations noted Neuro: A&O x 3, visual challenge shows loss of left lateral field, otherwise no focal deficits Psyc: Appropriate interaction and affect, calm/cooperative  Labs on Admission: I have personally reviewed following labs and imaging studies  CBC: Recent Labs  Lab 07/07/20 1540 07/07/20 1549  WBC 7.2  --   NEUTROABS 5.1  --   HGB 12.9* 12.2*  HCT 38.4* 36.0*  MCV 92.8  --   PLT 197  --    Basic Metabolic Panel: Recent Labs  Lab 07/07/20 1549  NA  139  K 4.9  CL 103  GLUCOSE 113*  BUN 39*  CREATININE 0.90   GFR: CrCl cannot be calculated (Unknown ideal weight.). Liver Function Tests: No results for input(s): AST, ALT, ALKPHOS, BILITOT, PROT, ALBUMIN in the last 168 hours. No results for input(s): LIPASE, AMYLASE in the last 168 hours. No results for input(s): AMMONIA in the last 168 hours. Coagulation Profile: Recent Labs  Lab 07/07/20 1540  INR 1.1   Cardiac Enzymes: No results for input(s): CKTOTAL, CKMB, CKMBINDEX, TROPONINI in the last 168 hours. BNP (last 3 results) No results for input(s): PROBNP in the last 8760 hours. HbA1C: No results for input(s): HGBA1C in the last 72 hours. CBG: No results for input(s): GLUCAP in the last 168 hours. Lipid Profile: No results for input(s): CHOL, HDL, LDLCALC, TRIG, CHOLHDL, LDLDIRECT in the last 72 hours. Thyroid Function Tests: No results for input(s): TSH, T4TOTAL, FREET4, T3FREE, THYROIDAB in the last 72 hours. Anemia Panel: No results for input(s): VITAMINB12, FOLATE, FERRITIN, TIBC, IRON, RETICCTPCT in the last 72 hours. Urine analysis:    Component Value Date/Time   COLORURINE YELLOW 03/04/2020 0000   APPEARANCEUR CLEAR 03/04/2020 0000   LABSPEC 1.017 03/04/2020 0000   PHURINE 6.5 03/04/2020 0000   GLUCOSEU NEGATIVE 03/04/2020 0000   HGBUR  NEGATIVE 03/04/2020 0000   BILIRUBINUR NEGATIVE 06/05/2012 2110   KETONESUR NEGATIVE 03/04/2020 0000   PROTEINUR NEGATIVE 03/04/2020 0000   UROBILINOGEN 1.0 06/05/2012 2110   NITRITE NEGATIVE 03/04/2020 0000   LEUKOCYTESUR TRACE (A) 03/04/2020 0000    Radiological Exams on Admission: CT HEAD WO CONTRAST  Result Date: 07/07/2020 CLINICAL DATA:  Right visual difficulties, initial encounter EXAM: CT HEAD WITHOUT CONTRAST TECHNIQUE: Contiguous axial images were obtained from the base of the skull through the vertex without intravenous contrast. COMPARISON:  01/07/2017 FINDINGS: Brain: Encephalomalacia is noted in the medial aspect of the right cerebellar hemisphere consistent with prior infarct. This is new from the prior exam. Mild atrophic changes are seen. Chronic white matter ischemic changes are noted as well. Previously seen para falcine subdural hematoma has resolved in the interval. No acute hemorrhage is noted. There is a geographic area of decreased attenuation identified in the right occipital lobe medially which was not visualized on the prior exam and suspicious for acute infarct. Vascular: No hyperdense vessel or unexpected calcification. Skull: Normal. Negative for fracture or focal lesion. Sinuses/Orbits: Orbits and their contents are within normal limits. Mild mucosal thickening is noted within the maxillary antra bilaterally. Other: None. IMPRESSION: Geographic area of decreased attenuation identified in the medial aspect of the right occipital lobe consistent with acute infarct. MRI may be helpful for further evaluation. Area of encephalomalacia in the posteromedial aspect of the right cerebellar hemisphere consistent with prior infarct. Chronic atrophic and ischemic changes. Critical Value/emergent results were called by telephone at the time of interpretation on 07/07/2020 at 3:53 pm to Dr. Carmin Muskrat , who verbally acknowledged these results. Electronically Signed   By: Inez Catalina  M.D.   On: 07/07/2020 15:55   Assessment/Plan CVA     - admit to inpt, tele @ [facility]     - neuro onboard, appreciate assistance     - MRI brain, CTA H&N, echo     - check A1c, lipid profile     - PT/OT/SLP     - ASA 81mg  qday, plavix 75mg  qday     - allow for permissive HTN for next 48 hrs or until stroke r/o'd by MRI     -  check EKG  Lupus     - continue home regimen  HTN     - allow for permissive HTN for next 48 hrs or until stroke r/o'd by MRI  HLD     - statin  Normocytic anemia     - no evidence of bleed     - check iron studies  DVT prophylaxis: SCDs  Code Status: FULL  Family Communication: w/ dtr at bedside  Consults called: Neurology  Status is: Inpatient  Remains inpatient appropriate because:Inpatient level of care appropriate due to severity of illness   Dispo: The patient is from: Home              Anticipated d/c is to: Home              Patient currently is not medically stable to d/c.   Difficult to place patient No  Jonnie Finner DO [facility]  If 7PM-7AM, please contact night-coverage [email or web address]  07/07/2020, 4:26 PM

## 2020-07-07 NOTE — ED Provider Notes (Signed)
[facility] DEPT Provider Note   CSN: [id number] Arrival date & time: 07/07/20  1418     History Chief Complaint  Patient presents with  . Visual Field Change  . Altered Mental Status    Tyler Norris is a 86 y.o. male.  HPI Patient presents with vision loss and dizziness.  Onset was today, about 7 hours prior to ED arrival.  Patient was well prior to this, has no recent illness, no recent medication change.  He notes that about that time he developed loss of vision in the left lateral visual field.  There is some dizziness, but he had no fall.  He denies any new speech difficulty, gross facial asymmetry, nausea, vomiting, diarrhea, fever. With persistent loss of vision in the left lateral field he presents for evaluation.    Past Medical History:  Diagnosis Date  . AAA (abdominal aortic aneurysm) ([place])    a. resolved by ct scan 09-2011  . Anemia   . Arthritis   . Barrett's esophagus   . Carpal tunnel syndrome, bilateral   . Essential hypertension   . Fibromyalgia   . GERD (gastroesophageal reflux disease)   . History of pneumonia   . Hyperlipidemia   . Lupus (systemic lupus erythematosus) ([place])   . Nephrolithiasis   . Pericardial effusion    a. 08/22/2014 Echo: EF 60-65%, mildly dil Ao root (48mm), mildly dil LA, mod-large partially organized pericardial effusion with possible early tamponade - asymptomatic on high dose nsaids-->conservative mgmt;  b. 08/2014 Limited Echo: EF 55-60%, Triv - small effusion.  . Sleep apnea    a. uses cpap every night    Patient Active Problem List   Diagnosis Date Noted  . SDH (subdural hematoma) ([place]) 01/08/2017  . Pleural effusion on left 06/02/2015  . Aneurysm of thoracic aorta ([place]) 06/02/2015  . SOB (shortness of breath) 08/22/2014  . Pericardial effusion 08/22/2014  . Lupus (systemic lupus erythematosus) ([place])   . 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 PE (pulmonary embolism) 06/05/2012   . Pleural effusion exudative 05/26/2012  . Essential hypertension 05/13/2012  . OSA on CPAP 05/13/2012  . Cardiomegaly 05/13/2012  . Abdominal aneurysm without mention of rupture 06/27/2011    Past Surgical History:  Procedure Laterality Date  . APPENDECTOMY    . CARPAL TUNNEL RELEASE  10/12/2011   Procedure: CARPAL TUNNEL RELEASE;  Surgeon: Cammie Sickle., MD;  Location: [facility];  Service: Orthopedics;  Laterality: Left;  . CARPAL TUNNEL RELEASE  12/01/2011   Procedure: CARPAL TUNNEL RELEASE;  Surgeon: Cammie Sickle., MD;  Location: [facility];  Service: Orthopedics;  Laterality: Right;  . CYSTOSCOPY W/ STONE MANIPULATION  [id number]  . HAND SURGERY     scar repaired lt hand  . HERNIA REPAIR  1966  . KNEE ARTHROPLASTY    . KNEE ARTHROSCOPY Left   . TONSILLECTOMY         Family History  Problem Relation Age of Onset  . Diabetes Mother   . Diabetes Sister   . Stroke Sister   . Healthy Daughter   . Healthy Son     Social History   Tobacco Use  . Smoking status: Never Smoker  . Smokeless tobacco: Never Used  Vaping Use  . Vaping Use: Never used  Substance Use Topics  . Alcohol use: Yes    Comment: once every 3 months  . Drug use: Never    Home Medications Prior  to Admission medications   Medication Sig Start Date End Date Taking? Authorizing Provider  acyclovir (ZOVIRAX) 400 MG tablet 2 (two) times daily. 04/09/17   [provider]  amLODipine (NORVASC) 5 MG tablet Take 1 tablet (5 mg total) daily by mouth. 01/11/17   Meuth, Brooke A, PA-C  aspirin 81 MG tablet Take 81 mg by mouth daily.    [provider]  Bioflavonoid Products (BIOFLEX PO) Take 2 tablets by mouth daily.     [provider]  CALCIUM PO Take 1 tablet daily by mouth.    [provider]  Cholecalciferol (VITAMIN D3) 5000 UNITS CAPS Take 5,000 Units by mouth daily.    [provider]  fenofibrate (TRICOR) 145 MG  tablet Take 145 mg by mouth daily.    [provider]  ferrous sulfate 325 (65 FE) MG tablet Take 325 mg by mouth as needed.    [provider]  folic acid (FOLVITE) 101 MCG tablet Take 400 mcg by mouth daily.    [provider]  hydroxychloroquine (PLAQUENIL) 200 MG tablet Take one tablet by mouth twice daily Monday-Friday only. 06/22/20   Ofilia Neas, PA-C  magnesium oxide (MAG-OX) 400 (241.3 MG) MG tablet Take 1 tablet (400 mg total) by mouth 3 (three) times daily. 09/02/14   Theora Gianotti, NP  metFORMIN (GLUCOPHAGE) 500 MG tablet Take 500 mg by mouth daily. 02/26/20   [provider]  metoprolol tartrate (LOPRESSOR) 25 MG tablet Take 0.5 tablets (12.5 mg total) 2 (two) times daily by mouth. 01/11/17   Meuth, Brooke A, PA-C  Multiple Vitamin (MULTIVITAMIN WITH MINERALS) TABS Take 1 tablet by mouth daily.    [provider]  Omega-3 Fatty Acids (FISH OIL) 1200 MG CAPS Take 1,200 mg by mouth daily.     [provider]  pantoprazole (PROTONIX) 40 MG tablet Take 40 mg by mouth daily.  03/09/18   [provider]  rosuvastatin (CRESTOR) 5 MG tablet Take 2.5 mg by mouth daily.     [provider]  telmisartan-hydrochlorothiazide (MICARDIS HCT) 80-12.5 MG tablet Take 1 tablet by mouth daily.    [provider]  vitamin C (ASCORBIC ACID) 500 MG tablet Take 1,000 mg by mouth daily.     [provider]  vitamin E 400 UNIT capsule Take 400 Units by mouth daily.    [provider]    Allergies    Atorvastatin and Lipitor [atorvastatin calcium]  Review of Systems   Review of Systems  Constitutional:       Per HPI, otherwise negative  HENT:       Per HPI, otherwise negative  Eyes: Positive for visual disturbance. Negative for pain.  Respiratory:       Per HPI, otherwise negative  Cardiovascular:       Per HPI, otherwise negative  Gastrointestinal: Negative for vomiting.  Endocrine:        Negative aside from HPI  Genitourinary:       Neg aside from HPI   Musculoskeletal:       Per HPI, otherwise negative  Skin: Negative.   Neurological: Negative for syncope.    Physical Exam Updated Vital Signs BP (!) 168/91   Pulse 64   Temp 97.6 F (36.4 C) (Oral)   Resp 16   SpO2 98%   Physical Exam Vitals and nursing note reviewed.  Constitutional:      General: He is not in acute distress.    Appearance: He  is well-developed.  HENT:     Head: Normocephalic and atraumatic.  Eyes:     Conjunctiva/sclera: Conjunctivae normal.  Cardiovascular:     Rate and Rhythm: Normal rate and regular rhythm.  Pulmonary:     Effort: Pulmonary effort is normal. No respiratory distress.     Breath sounds: No stridor.  Abdominal:     General: There is no distension.  Skin:    General: Skin is warm and dry.  Neurological:     Mental Status: He is alert and oriented to person, place, and time.     Cranial Nerves: No facial asymmetry.     Motor: No weakness, tremor, atrophy or abnormal muscle tone.     Coordination: Romberg sign negative. Coordination normal. Finger-Nose-Finger Test normal.     Comments: Left lateral field loss with left eye, right eye unremarkable. No facial asymmetry.  Dysarthria not appreciated, though another clinician has noticed a mild amount.      ED Results / Procedures / Treatments   Labs (all labs ordered are listed, but only abnormal results are displayed) Labs Reviewed  CBC - Abnormal; Notable for the following components:      Result Value   RBC 4.14 (*)    Hemoglobin 12.9 (*)    HCT 38.4 (*)    All other components within normal limits  I-STAT CHEM 8, ED - Abnormal; Notable for the following components:   BUN 39 (*)    Glucose, Bld 113 (*)    Hemoglobin 12.2 (*)    HCT 36.0 (*)    All other components within normal limits  RESP PANEL BY RT-PCR (FLU A&B, COVID) ARPGX2  DIFFERENTIAL  PROTIME-INR  APTT  COMPREHENSIVE METABOLIC PANEL  RAPID  URINE DRUG SCREEN, HOSP PERFORMED  URINALYSIS, ROUTINE W REFLEX MICROSCOPIC    EKG None  Radiology CT HEAD WO CONTRAST  Result Date: 07/07/2020 CLINICAL DATA:  Right visual difficulties, initial encounter EXAM: CT HEAD WITHOUT CONTRAST TECHNIQUE: Contiguous axial images were obtained from the base of the skull through the vertex without intravenous contrast. COMPARISON:  01/07/2017 FINDINGS: Brain: Encephalomalacia is noted in the medial aspect of the right cerebellar hemisphere consistent with prior infarct. This is new from the prior exam. Mild atrophic changes are seen. Chronic white matter ischemic changes are noted as well. Previously seen para falcine subdural hematoma has resolved in the interval. No acute hemorrhage is noted. There is a geographic area of decreased attenuation identified in the right occipital lobe medially which was not visualized on the prior exam and suspicious for acute infarct. Vascular: No hyperdense vessel or unexpected calcification. Skull: Normal. Negative for fracture or focal lesion. Sinuses/Orbits: Orbits and their contents are within normal limits. Mild mucosal thickening is noted within the maxillary antra bilaterally. Other: None. IMPRESSION: Geographic area of decreased attenuation identified in the medial aspect of the right occipital lobe consistent with acute infarct. MRI may be helpful for further evaluation. Area of encephalomalacia in the posteromedial aspect of the right cerebellar hemisphere consistent with prior infarct. Chronic atrophic and ischemic changes. Critical Value/emergent results were called by telephone at the time of interpretation on 07/07/2020 at 3:53 pm to Dr. Carmin Muskrat , who verbally acknowledged these results. Electronically Signed   By: Inez Catalina M.D.   On: 07/07/2020 15:55    Procedures Procedures   Medications Ordered in ED Medications  sodium chloride 0.9 % bolus 500 mL (500 mLs Intravenous New Bag/Given 07/07/20 1545)     Followed by  0.9 %  sodium chloride infusion (100 mL/hr Intravenous New Bag/Given 07/07/20 1544)    ED Course  I have reviewed the triage vital signs and the nursing notes.  Pertinent labs & imaging results that were available during my care of the patient were reviewed by me and considered in my medical decision making (see chart for details). After the initial evaluation and I reviewed the patient's CT scan and I discussed his case with the neurologist who recommends designated the patient as a code stroke.  4:19 PM Patient has been seen and evaluated by telemetry neurology.  NIH 2.  We reviewed the CT scan, concern for evolving stroke, but with low NIH, no evidence for large vessel occlusion he does not require IR, but does require MRI, admission for stroke, vision loss.   MDM Rules/Calculators/A&P MDM Number of Diagnoses or Management Options Acute ischemic stroke [facility]): new, needed workup   Amount and/or Complexity of Data Reviewed Clinical lab tests: ordered and reviewed Tests in the radiology section of CPT: ordered and reviewed Tests in the medicine section of CPT: reviewed and ordered Discussion of test results with the performing providers: yes Decide to obtain previous medical records or to obtain history from someone other than the patient: yes Obtain history from someone other than the patient: yes Review and summarize past medical records: yes Discuss the patient with other providers: yes Independent visualization of images, tracings, or specimens: yes  Risk of Complications, Morbidity, and/or Mortality Presenting problems: high Diagnostic procedures: high Management options: high  Critical Care Total time providing critical care: < 30 minutes  Patient Progress Patient progress: stable  Final Clinical Impression(s) / ED Diagnoses Final diagnoses:  Acute ischemic stroke [facility])     Carmin Muskrat, MD 07/07/20 1620

## 2020-07-07 NOTE — ED Triage Notes (Signed)
Pt complains of change in visual field since waking up at 930AM this morning. He reports losing the left side of his vision in his left eye. Also reports feeling disoriented.

## 2020-07-08 ENCOUNTER — Inpatient Hospital Stay (HOSPITAL_COMMUNITY): Payer: Medicare Other

## 2020-07-08 DIAGNOSIS — K219 Gastro-esophageal reflux disease without esophagitis: Secondary | ICD-10-CM | POA: Diagnosis not present

## 2020-07-08 DIAGNOSIS — E785 Hyperlipidemia, unspecified: Secondary | ICD-10-CM | POA: Diagnosis not present

## 2020-07-08 DIAGNOSIS — Z20822 Contact with and (suspected) exposure to covid-19: Secondary | ICD-10-CM | POA: Diagnosis not present

## 2020-07-08 DIAGNOSIS — I10 Essential (primary) hypertension: Secondary | ICD-10-CM

## 2020-07-08 DIAGNOSIS — I63431 Cerebral infarction due to embolism of right posterior cerebral artery: Secondary | ICD-10-CM | POA: Diagnosis not present

## 2020-07-08 DIAGNOSIS — I639 Cerebral infarction, unspecified: Secondary | ICD-10-CM | POA: Diagnosis not present

## 2020-07-08 LAB — IRON AND TIBC
Iron: 48 ug/dL (ref 45–182)
Saturation Ratios: 15 % — ABNORMAL LOW (ref 17.9–39.5)
TIBC: 328 ug/dL (ref 250–450)
UIBC: 280 ug/dL

## 2020-07-08 LAB — HEMOGLOBIN A1C
Hgb A1c MFr Bld: 7.5 % — ABNORMAL HIGH (ref 4.8–5.6)
Mean Plasma Glucose: 168.55 mg/dL

## 2020-07-08 LAB — LIPID PANEL
Cholesterol: 147 mg/dL (ref 0–200)
HDL: 33 mg/dL — ABNORMAL LOW (ref >40)
LDL Cholesterol: 67 mg/dL (ref 0–99)
Total CHOL/HDL Ratio: 4.5 RATIO
Triglycerides: 234 mg/dL — ABNORMAL HIGH (ref <150)
VLDL: 47 mg/dL — ABNORMAL HIGH (ref 0–40)

## 2020-07-08 MED ORDER — ASPIRIN EC 81 MG PO TBEC
81.0000 mg | DELAYED_RELEASE_TABLET | Freq: Every day | ORAL | Status: DC
  Administered 2020-07-08 – 2020-07-10 (×3): 81 mg via ORAL
  Filled 2020-07-08 (×3): qty 1

## 2020-07-08 MED ORDER — FENOFIBRATE 160 MG PO TABS
160.0000 mg | ORAL_TABLET | Freq: Every day | ORAL | Status: DC
  Administered 2020-07-08 – 2020-07-10 (×3): 160 mg via ORAL
  Filled 2020-07-08 (×3): qty 1

## 2020-07-08 MED ORDER — CLOPIDOGREL BISULFATE 75 MG PO TABS
75.0000 mg | ORAL_TABLET | Freq: Every day | ORAL | Status: DC
  Administered 2020-07-08 – 2020-07-10 (×3): 75 mg via ORAL
  Filled 2020-07-08 (×3): qty 1

## 2020-07-08 MED ORDER — PANTOPRAZOLE SODIUM 40 MG PO TBEC
40.0000 mg | DELAYED_RELEASE_TABLET | Freq: Every day | ORAL | Status: DC
  Administered 2020-07-08 – 2020-07-10 (×3): 40 mg via ORAL
  Filled 2020-07-08 (×3): qty 1

## 2020-07-08 MED ORDER — HYDROXYCHLOROQUINE SULFATE 200 MG PO TABS
200.0000 mg | ORAL_TABLET | Freq: Two times a day (BID) | ORAL | Status: DC
  Administered 2020-07-08 – 2020-07-10 (×5): 200 mg via ORAL
  Filled 2020-07-08 (×6): qty 1

## 2020-07-08 MED ORDER — ROSUVASTATIN CALCIUM 5 MG PO TABS
5.0000 mg | ORAL_TABLET | Freq: Every day | ORAL | Status: DC
  Administered 2020-07-08 – 2020-07-10 (×3): 5 mg via ORAL
  Filled 2020-07-08 (×3): qty 1

## 2020-07-08 NOTE — Evaluation (Signed)
Speech Language Pathology Evaluation Patient Details Name: Tyler Norris MRN: [id number] DOB: 1934-11-16 Today's Date: 07/08/2020 Time: 0930-1000 SLP Time Calculation (min) (ACUTE ONLY): 30 min  Problem List:  Patient Active Problem List   Diagnosis Date Noted  . CVA (cerebral vascular accident) ([place]) 07/07/2020  . SDH (subdural hematoma) ([place]) 01/08/2017  . Pleural effusion on left 06/02/2015  . Aneurysm of thoracic aorta ([place]) 06/02/2015  . SOB (shortness of breath) 08/22/2014  . Pericardial effusion 08/22/2014  . Lupus (systemic lupus erythematosus) ([place])   . 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 PE (pulmonary embolism) 06/05/2012  . Pleural effusion exudative 05/26/2012  . Essential hypertension 05/13/2012  . OSA on CPAP 05/13/2012  . Cardiomegaly 05/13/2012  . Abdominal aneurysm without mention of rupture 06/27/2011   Past Medical History:  Past Medical History:  Diagnosis Date  . AAA (abdominal aortic aneurysm) ([place])    a. resolved by ct scan 09-2011  . Anemia   . Arthritis   . Barrett's esophagus   . Carpal tunnel syndrome, bilateral   . Essential hypertension   . Fibromyalgia   . GERD (gastroesophageal reflux disease)   . History of pneumonia   . Hyperlipidemia   . Lupus (systemic lupus erythematosus) ([place])   . Nephrolithiasis   . Pericardial effusion    a. 08/22/2014 Echo: EF 60-65%, mildly dil Ao root (26mm), mildly dil LA, mod-large partially organized pericardial effusion with possible early tamponade - asymptomatic on high dose nsaids-->conservative mgmt;  b. 08/2014 Limited Echo: EF 55-60%, Triv - small effusion.  . Sleep apnea    a. uses cpap every night   Past Surgical History:  Past Surgical History:  Procedure Laterality Date  . APPENDECTOMY    . CARPAL TUNNEL RELEASE  10/12/2011   Procedure: CARPAL TUNNEL RELEASE;  Surgeon: Cammie Sickle., MD;  Location: [facility];  Service: Orthopedics;  Laterality: Left;  . CARPAL  TUNNEL RELEASE  12/01/2011   Procedure: CARPAL TUNNEL RELEASE;  Surgeon: Cammie Sickle., MD;  Location: [facility];  Service: Orthopedics;  Laterality: Right;  . CYSTOSCOPY W/ STONE MANIPULATION  [id number]  . HAND SURGERY     scar repaired lt hand  . HERNIA REPAIR  1966  . KNEE ARTHROPLASTY    . KNEE ARTHROSCOPY Left   . TONSILLECTOMY     HPI:  86 y.o. male with medical history significant of lupus, HTN, HLD. Presented on 07/07/20 with left lateral visual field loss. He was in his normal state of health until this morning. He woke up to go to the bathroom. While in the bathroom, he noticed that he left lateral visual field went out. He became dizzy and had to balance himself on the wall. He tried to talk out of the bathroom and down the hallway. He found this to be difficult. He began feeling disoriented and wasn't sure where he was. He called for his daughter who came and brought him to the ED for evaluation. MRI head 07/07/20 indicated Acute right PCA territory infarction involving occipitotemporal  lobes.  Chronic right cerebellar infarcts with chronic blood products; SLE generated.   Assessment / Plan / Recommendation Clinical Impression  Pt seen for speech/language/cognitive assessment with [place] Mental Status Examination (SLUMS) with an overall score obtained of 26/30 with defiicts only noted within the areas of memory (word recall) and clock hand placement d/t visual defiicits (otherwise normal).  Pt recalled 3/5 objects after a time delay, but pt  voiced "I forget people's names sometimes and I have noticed I am sometimes forgetful with things like that."  Auditory comprehension, verbal expression and other areas of cognition all WNL.  ST will not f/u in acute setting as pt is likely functioning at a baseline level.  Pt able to voice safety precautions such as "I am not going to drive now" and functional problem solving within current setting "I need to get the  nurse before I get up in case I get dizzy." Thank you for this consult.    SLP Assessment  SLP Recommendation/Assessment: Patient does not need any further Speech Language Pathology Services SLP Visit Diagnosis: Cognitive communication deficit (R41.841)    Follow Up Recommendations   (supervision d/t visual changes)    Frequency and Duration   Evaluation only        SLP Evaluation Cognition  Overall Cognitive Status: Impaired/Different from baseline Arousal/Alertness: Awake/alert Orientation Level: Oriented X4 Attention: Sustained Sustained Attention: Appears intact Memory: Impaired Memory Impairment: Decreased short term memory;Retrieval deficit Decreased Short Term Memory: Verbal basic;Functional basic Immediate Memory Recall: Sock;Blue;Bed Memory Recall Sock: Without Cue Memory Recall Blue: With Cue Memory Recall Bed: With Cue Awareness: Appears intact Problem Solving: Appears intact Safety/Judgment: Appears intact       Comprehension  Auditory Comprehension Overall Auditory Comprehension: Appears within functional limits for tasks assessed Conversation: Complex Visual Recognition/Discrimination Discrimination: Within Function Limits Reading Comprehension Reading Status: Within funtional limits    Expression Expression Primary Mode of Expression: Verbal Verbal Expression Overall Verbal Expression: Appears within functional limits for tasks assessed Initiation: No impairment Level of Generative/Spontaneous Verbalization: Conversation Repetition: No impairment Naming: No impairment Pragmatics: No impairment Non-Verbal Means of Communication: Not applicable Written Expression Dominant Hand: Right Written Expression: Within Functional Limits (visual deficits affect without compensatory strategies used I)   Oral / Motor  Oral Motor/Sensory Function Overall Oral Motor/Sensory Function: Within functional limits Motor Speech Overall Motor Speech: Appears within  functional limits for tasks assessed Respiration: Within functional limits Phonation: Normal;Hoarse;Other (comment) Resonance: Within functional limits Articulation: Within functional limitis Intelligibility: Intelligible Motor Planning: Witnin functional limits Motor Speech Errors: Not applicable                       Elvina Sidle, M.S., [place] 07/08/2020, 2:19 PM

## 2020-07-08 NOTE — Progress Notes (Signed)
STROKE TEAM PROGRESS NOTE   INTERVAL HISTORY No acute events.  Daughter is at bedside.   He recalls he lost vision and balance when he sat up on side of bed yesterday. Did not seek care immediately. Later he had difficulty thinking/processing and decided to come to ED.  In regards to old stroke he did have an episode a few weeks ago where he had balance difficulty, nausea and difficulty thinking which was transient. He thought it was vertigo. He fell from a ladder 2018 and developed SDH.   Animal naming was quite good, unable to recall 3/3 words   We discussed stroke diagnosis, ongoing work up, diagnostic findings and plan of care. We discussed need for help with medications, keeping appointments. Questions answered.   Vitals:   07/07/20 2230 07/07/20 2344 07/08/20 0351 07/08/20 0727  BP: (!) 182/76 (!) 184/88 (!) 171/84 (!) 155/76  Pulse: 76 72 72 74  Resp: 18 19 18 20   Temp:  98.1 F (36.7 C) 98.8 F (37.1 C) 98 F (36.7 C)  TempSrc:  Oral Oral Oral  SpO2: 98% 96% 98% 93%   CBC:  Recent Labs  Lab 07/07/20 1540 07/07/20 1549  WBC 7.2  --   NEUTROABS 5.1  --   HGB 12.9* 12.2*  HCT 38.4* 36.0*  MCV 92.8  --   PLT 197  --    Basic Metabolic Panel:  Recent Labs  Lab 07/07/20 1540 07/07/20 1549  NA 139 139  K 3.9 4.9  CL 104 103  CO2 26  --   GLUCOSE 118* 113*  BUN 30* 39*  CREATININE 1.01 0.90  CALCIUM 10.0  --    Lipid Panel:  Recent Labs  Lab 07/08/20 0307  CHOL 147  TRIG 234*  HDL 33*  CHOLHDL 4.5  VLDL 47*  LDLCALC 67   HgbA1c:  Recent Labs  Lab 07/08/20 0307  HGBA1C 7.5*   Urine Drug Screen:  Recent Labs  Lab 07/07/20 1620  LABOPIA NONE DETECTED  COCAINSCRNUR NONE DETECTED  LABBENZ NONE DETECTED  AMPHETMU NONE DETECTED  THCU NONE DETECTED  LABBARB NONE DETECTED    Alcohol Level No results for input(s): ETH in the last 168 hours.  IMAGING past 24 hours  CTA Head and Neck 1. Negative CTA for emergent large vessel occlusion. 2. Bulky  calcified plaque about the left carotid bulb with associated stenosis of up to approximately 50% by NASCET criteria. 3. Moderate right and mild left vertebral artery origin stenoses. 4. Diffuse tortuosity of the major arterial vasculature of the head and neck, suggesting chronic underlying hypertension. 5. Aortic Atherosclerosis (ICD10-I70.0) and Emphysema (ICD10-J43.9).      PHYSICAL EXAM  Temp:  [97.8 F (36.6 C)-98.8 F (37.1 C)] 98 F (36.7 C) (05/12 0727) Pulse Rate:  [62-79] 74 (05/12 0727) Resp:  [16-20] 20 (05/12 0727) BP: (155-184)/(76-91) 155/76 (05/12 0727) SpO2:  [93 %-99 %] 93 % (05/12 0727)  General -pleasant mildly obese Caucasian elderly male sitting up in chair in no apparent distress.   Cardiovascular - Regular rhythm and rate  Mental Status -  Level of arousal and orientation to time, place, and person were intact.  Diminished recall 0/3.  Able to name 14 animals which can walk on 4 legs. Language including expression, naming, repetition, comprehension was assessed and found intact. Attention span and concentration were normal. Unable to recall 4/4 words. Able to name 13+ animals.  Cranial Nerves II - XII - II -dense visual field cut on the left.  III, IV, VI - Extraocular movements intact V - Facial sensation intact bilaterally VII - Facial movement intact bilaterally VIII - Hearing & vestibular intact bilaterally X - Palate elevates symmetrically XI - Shoulder shrug intact bilaterally XII - Tongue protrusion intact  Motor Strength - The patient's strength was normal in all extremities and pronator drift was absent.  Bulk was normal and fasciculations were absent   Motor Tone - Muscle tone was assessed at the neck and appendages and was normal  Sensory - Light touch, temperature/pinprick were assessed and were symmetrical    Coordination - The patient had normal movements in the hands and feet with no ataxia or dysmetria.  Tremor was absent  Gait  and Station - deferred.  ASSESSMENT/PLAN 86 yo with hx AAA, HTN, HL, SLE, OSA presented [facility] ED after acute onset lateral hemifield VF deficit in L eye only associated with feeling off balance and some motor apraxia. Seen via telestroke. Outside of tPA window. NIHSS2.  Acute right PCA territory infarction involving occipitotemporal lobes.  Code Stroke: Acute right occipital lobe infarct, old hemorrhagic infarct right cerebellar hemisphere  CTA head & neck: No LVO  MRI: Acute right PCA territory infarction involving occipitotemporal lobes.  2D Echo: Pending  LDL 67  HgbA1c 7.5  VTE prophylaxis - ambulatory, discharging home     Diet   Diet Heart Room service appropriate? Yes; Fluid consistency: Thin     On ASA 81mg  prior to admission  DAPT x 3 weeks with ASA 81 and Plavix, then ASA alone  Therapy recommendations:  Home health PT  Disposition:  Home   Right vertebral artery origin stenosis, moderate possibly symptomatic  Will follow up outpatient with IR team for consideration of arteriogram to further evaluate  Hypertension . Permissive hypertension (OK if < 220/120) but gradually normalize in 5-7 days . Long-term BP goal normotensive  Hyperlipidemia  Home meds:  Crestor 2.5mg  daily   LDL 67, st goal < 70  High intensity statin not indicated due to adequate control on current regimen  Has statin allergy/intolerance listed to lipitor: myalgias  Continue statin at discharge  Diabetes type II Uncontrolled  HgbA1c 7.5, not quite at goal < 7.0  CBGs  No results for input(s): GLUCAP in the last 72 hours.   SSI  Other Stroke Risk Factors  Advanced Age >/= 61   Obesity, There is no height or weight on file to calculate BMI., BMI >/= 30 associated with increased stroke risk, recommend weight loss, diet and exercise as appropriate   Hx stroke apparent on imaging   Family hx stroke (sister)  Obstructive sleep apnea, on CPAP at home   Other Active  Problems  Closed head injury following fall from roof in 2018 with trace subdural hemorrhage and ?  Delayed hemorrhagic contusion of the right cerebellum not seen on initial [facility] day # 1 I have personally obtained history,examined this patient, reviewed notes, independently viewed imaging studies, participated in medical decision making and plan of care.ROS completed by me personally and pertinent positives fully documented  I have made any additions or clarifications directly to the above note. Agree with note above.  Patient presented with a right posterior cerebral artery embolic infarct likely of cryptogenic etiology however the CT angiogram does show moderate right vertebral artery ostial stenosis which needs further evaluation.  Recommend diagnostic cerebral catheter angiogram to evaluate this and if it is significant may consider an elective angioplasty stenting.  Recommend aspirin Plavix for 3  weeks followed by aspirin alone.  May need 30-day heart monitor at discharge for paroxysmal A. fib.  Continue ongoing stroke work-up and aggressive risk factor modification.  Long discussion with patient and daughter at the bedside and answered questions.  Greater than 50% time during this 35-minute visit was spent on counseling and coordination of care about the stroke discussion about evaluation and treatment.  Discussed with Dr. Margaretha Glassing, MD Medical Director [place] Pager: [phone] 07/08/2020 4:30 PM   To contact Stroke Continuity provider, please refer to [email or web address] After hours, contact General Neurology

## 2020-07-08 NOTE — Progress Notes (Signed)
[facility] PROGRESS NOTE   Tyler Norris PPJ:[id number] DOB: 04-Jan-1935 DOA: 07/07/2020  PCP: [name], MD  Brief History/Interval Summary: 86 y.o. male with medical history significant of lupus, HTN, HLD. Presenting with left lateral visual field loss.  Patient was found to have an acute stroke.  He was hospitalized for further management.  Reason for Visit: Acute stroke  Consultants: Neurology  Procedures: Transthoracic echocardiogram has been ordered  Antibiotics: Anti-infectives (From admission, onward)   None      Subjective/Interval History: Patient mentions that he continues to have some vision problems on the left side.  Denies any headaches.  He mentions that for the past few weeks has been having some discomfort in his right upper abdomen radiating to the chest as well as back as well as to his shoulder area.  Denies any nausea vomiting.  No pain currently.    Assessment/Plan:  Acute CVA in the right PCA territory Neurology is following.  CT angiogram head and neck suggested a 50% stenosis in the left carotid.  No intracranial stenosis of significance were noted.  Patient was on aspirin at home.  Per neurology recommendation Plavix is to be added.  LDL 67.  HbA1c 7.5.  PT and OT.  Speech therapy to see.  Echocardiogram is pending. Further management recommendations per neurology.  History of SLE Noted to be on hydroxychloroquine which will be continued.  Essential hypertension Currently permissive hypertension is being allowed.  Noted to be on amlodipine, metoprolol, Micardis at home prior to admission.  Hyperlipidemia Continue Crestor.  LDL is 67.  Normocytic anemia Outpatient management.  On and off right-sided abdominal pain ongoing for 3 weeks Abdomen is benign currently.  LFTs were normal.  Will defer to outpatient providers.  Obesity Estimated body mass index is 31.15 kg/m as calculated from the following:   Height as of 03/04/20:  5' 9.5" (1.765 m).   Weight as of 03/04/20: 97.1 kg.   DVT Prophylaxis: Initiate DVT prophylaxis Code Status: Full code Family Communication: Discussed with the patient.  No family at bedside Disposition Plan: Patient lives by himself.  PT and OT evaluation.  Status is: Inpatient  Remains inpatient appropriate because:Ongoing diagnostic testing needed not appropriate for outpatient work up and Inpatient level of care appropriate due to severity of illness   Dispo: The patient is from: Home              Anticipated d/c is to: Home              Patient currently is not medically stable to d/c.   Difficult to place patient No        Medications:  Scheduled: . aspirin EC  81 mg Oral Daily  . clopidogrel  75 mg Oral Daily  . rosuvastatin  5 mg Oral Daily   Continuous: . sodium chloride 100 mL/hr (07/08/20 0834)   PYK:[id number] **OR** acetaminophen (TYLENOL) oral liquid 160 mg/5 mL **OR** acetaminophen, senna-docusate   Objective:  Vital Signs  Vitals:   07/07/20 2230 07/07/20 2344 07/08/20 0351 07/08/20 0727  BP: (!) 182/76 (!) 184/88 (!) 171/84 (!) 155/76  Pulse: 76 72 72 74  Resp: 18 19 18 20   Temp:  98.1 F (36.7 C) 98.8 F (37.1 C) 98 F (36.7 C)  TempSrc:  Oral Oral Oral  SpO2: 98% 96% 98% 93%    Intake/Output Summary (Last 24 hours) at 07/08/2020 0911 Last data filed at 07/08/2020 0700 Gross per 24 hour  Intake --  Output 900 ml  Net -900 ml   There were no vitals filed for this visit.   General appearance: Awake alert.  In no distress Resp: Clear to auscultation bilaterally.  Normal effort Cardio: S1-S2 is normal regular.  No S3-S4.  No rubs murmurs or bruit GI: Abdomen is soft.  Nontender nondistended.  Bowel sounds are present normal.  No masses organomegaly Extremities: No edema.  Full range of motion of lower extremities. Neurologic: Alert and oriented x3.  No focal neurological deficits.    Lab Results:  Data Reviewed: I have personally  reviewed following labs and imaging studies  CBC: Recent Labs  Lab 07/07/20 1540 07/07/20 1549  WBC 7.2  --   NEUTROABS 5.1  --   HGB 12.9* 12.2*  HCT 38.4* 36.0*  MCV 92.8  --   PLT 197  --     Basic Metabolic Panel: Recent Labs  Lab 07/07/20 1540 07/07/20 1549  NA 139 139  K 3.9 4.9  CL 104 103  CO2 26  --   GLUCOSE 118* 113*  BUN 30* 39*  CREATININE 1.01 0.90  CALCIUM 10.0  --     GFR: CrCl cannot be calculated (Unknown ideal weight.).  Liver Function Tests: Recent Labs  Lab 07/07/20 1540  AST 19  ALT 15  ALKPHOS 33*  BILITOT 0.8  PROT 7.1  ALBUMIN 4.5     Coagulation Profile: Recent Labs  Lab 07/07/20 1540  INR 1.1    HbA1C: Recent Labs    07/08/20 0307  HGBA1C 7.5*     Lipid Profile: Recent Labs    07/08/20 0307  CHOL 147  HDL 33*  LDLCALC 67  TRIG 234*  CHOLHDL 4.5     Recent Results (from the past 240 hour(s))  Resp Panel by RT-PCR (Flu A&B, Covid) Nasopharyngeal Swab     Status: None   Collection Time: 07/07/20  2:45 PM   Specimen: Nasopharyngeal Swab; Nasopharyngeal(NP) swabs in vial transport medium  Result Value Ref Range Status   SARS Coronavirus 2 by RT PCR NEGATIVE NEGATIVE Final    Comment: (NOTE) SARS-CoV-2 target nucleic acids are NOT DETECTED.  The SARS-CoV-2 RNA is generally detectable in upper respiratory specimens during the acute phase of infection. The lowest concentration of SARS-CoV-2 viral copies this assay can detect is 138 copies/mL. A negative result does not preclude SARS-Cov-2 infection and should not be used as the sole basis for treatment or other patient management decisions. A negative result may occur with  improper specimen collection/handling, submission of specimen other than nasopharyngeal swab, presence of viral mutation(s) within the areas targeted by this assay, and inadequate number of viral copies(<138 copies/mL). A negative result must be combined with clinical observations,  patient history, and epidemiological information. The expected result is Negative.  Fact Sheet for Patients:  EntrepreneurPulse.com.au  Fact Sheet for Healthcare Providers:  IncredibleEmployment.be  This test is no t yet approved or cleared by the [place] FDA and  has been authorized for detection and/or diagnosis of SARS-CoV-2 by FDA under an Emergency Use Authorization (EUA). This EUA will remain  in effect (meaning this test can be used) for the duration of the COVID-19 declaration under Section 564(b)(1) of the Act, 21 U.S.C.section 360bbb-3(b)(1), unless the authorization is terminated  or revoked sooner.       Influenza A by PCR NEGATIVE NEGATIVE Final   Influenza B by PCR NEGATIVE NEGATIVE Final    Comment: (NOTE) The Xpert Xpress SARS-CoV-2/FLU/RSV plus assay is intended  as an aid in the diagnosis of influenza from Nasopharyngeal swab specimens and should not be used as a sole basis for treatment. Nasal washings and aspirates are unacceptable for Xpert Xpress SARS-CoV-2/FLU/RSV testing.  Fact Sheet for Patients: EntrepreneurPulse.com.au  Fact Sheet for Healthcare Providers: IncredibleEmployment.be  This test is not yet approved or cleared by the [place] FDA and has been authorized for detection and/or diagnosis of SARS-CoV-2 by FDA under an Emergency Use Authorization (EUA). This EUA will remain in effect (meaning this test can be used) for the duration of the COVID-19 declaration under Section 564(b)(1) of the Act, 21 U.S.C. section 360bbb-3(b)(1), unless the authorization is terminated or revoked.  Performed at [facility], [place] [address], [place], [place] [address]       Radiology Studies: CT ANGIO HEAD W OR WO CONTRAST  Result Date: 07/08/2020 CLINICAL DATA:  Initial evaluation for acute stroke. EXAM: CT ANGIOGRAPHY HEAD AND NECK TECHNIQUE: Multidetector  CT imaging of the head and neck was performed using the standard protocol during bolus administration of intravenous contrast. Multiplanar CT image reconstructions and MIPs were obtained to evaluate the vascular anatomy. Carotid stenosis measurements (when applicable) are obtained utilizing NASCET criteria, using the distal internal carotid diameter as the denominator. CONTRAST:  37mL OMNIPAQUE IOHEXOL 350 MG/ML SOLN COMPARISON:  Previous CT and MRI from earlier the same day. FINDINGS: CTA NECK FINDINGS Aortic arch: Examination mildly degraded by motion artifact. Visualized aortic arch normal caliber with normal 3 vessel morphology. Mild-to-moderate atheromatous change about the arch and origin of the great vessels without hemodynamically significant stenosis. Right carotid system: Right CCA tortuous proximally but is widely patent to the bifurcation without stenosis. Mild atheromatous change about the right carotid bulb/proximal right ICA without significant stenosis. Right ICA patent distally without stenosis, dissection or occlusion. Left carotid system: Left CCA tortuous proximally but is widely patent to the bifurcation without stenosis. Bulky calcified plaque about the left carotid bulb with associated stenosis of up to approximately 50% by NASCET criteria. Left ICA patent distally without stenosis, dissection or occlusion. Vertebral arteries: Both vertebral arteries arise from subclavian arteries. No proximal subclavian artery stenosis. Atheromatous change about the origins of both vertebral arteries with associated moderate stenosis on the right and no more than mild narrowing on the left. Vertebral arteries mildly tortuous but are otherwise patent without stenosis, dissection or occlusion. Skeleton: No visible acute osseous finding. No discrete or worrisome osseous lesions. Moderate cervical spondylosis at C4-5 through C6-7 without high-grade stenosis. Other neck: No other acute soft tissue abnormality  within the neck. 1.6 cm right thyroid nodule noted. In the setting of significant comorbidities or limited life expectancy, no follow-up recommended (ref: J Am Coll Radiol. 2015 Feb;12(2): 143-50). No other mass or adenopathy. Upper chest: Visualized upper chest demonstrates no acute finding. Centrilobular emphysematous changes. Review of the MIP images confirms the above findings CTA HEAD FINDINGS Anterior circulation: Petrous segments patent bilaterally. Atheromatous change throughout the carotid siphons with no more than mild narrowing. A1 segments patent bilaterally. Normal anterior communicating artery complex. Anterior cerebral arteries patent to their distal aspects without stenosis. No M1 stenosis or occlusion. Normal MCA bifurcations. Distal MCA branches well perfused and symmetric. Posterior circulation: Mild nonstenotic atheromatous plaque within the V4 segments bilaterally without significant stenosis. Both vertebral arteries patent to the vertebrobasilar junction. Left vertebral artery slightly dominant. Right PICA patent. Left PICA not definitely seen. Basilar patent to its distal aspect without stenosis. Superior cerebellar arteries patent bilaterally. Both PCAs primarily  supplied via the basilar. PCAs widely patent proximally. Distal right PCA somewhat attenuated, in keeping with the acute right PCA distribution infarct. Venous sinuses: Grossly patent allowing for timing the contrast bolus. Anatomic variants: None significant.  No aneurysm. Review of the MIP images confirms the above findings IMPRESSION: 1. Negative CTA for emergent large vessel occlusion. 2. Bulky calcified plaque about the left carotid bulb with associated stenosis of up to approximately 50% by NASCET criteria. 3. Moderate right and mild left vertebral artery origin stenoses. 4. Diffuse tortuosity of the major arterial vasculature of the head and neck, suggesting chronic underlying hypertension. 5. Aortic Atherosclerosis  (ICD10-I70.0) and Emphysema (ICD10-J43.9). Electronically Signed   By: Jeannine Boga M.D.   On: 07/08/2020 00:13   CT HEAD WO CONTRAST  Result Date: 07/07/2020 CLINICAL DATA:  Right visual difficulties, initial encounter EXAM: CT HEAD WITHOUT CONTRAST TECHNIQUE: Contiguous axial images were obtained from the base of the skull through the vertex without intravenous contrast. COMPARISON:  01/07/2017 FINDINGS: Brain: Encephalomalacia is noted in the medial aspect of the right cerebellar hemisphere consistent with prior infarct. This is new from the prior exam. Mild atrophic changes are seen. Chronic white matter ischemic changes are noted as well. Previously seen para falcine subdural hematoma has resolved in the interval. No acute hemorrhage is noted. There is a geographic area of decreased attenuation identified in the right occipital lobe medially which was not visualized on the prior exam and suspicious for acute infarct. Vascular: No hyperdense vessel or unexpected calcification. Skull: Normal. Negative for fracture or focal lesion. Sinuses/Orbits: Orbits and their contents are within normal limits. Mild mucosal thickening is noted within the maxillary antra bilaterally. Other: None. IMPRESSION: Geographic area of decreased attenuation identified in the medial aspect of the right occipital lobe consistent with acute infarct. MRI may be helpful for further evaluation. Area of encephalomalacia in the posteromedial aspect of the right cerebellar hemisphere consistent with prior infarct. Chronic atrophic and ischemic changes. Critical Value/emergent results were called by telephone at the time of interpretation on 07/07/2020 at 3:53 pm to Dr. Carmin Muskrat , who verbally acknowledged these results. Electronically Signed   By: Inez Catalina M.D.   On: 07/07/2020 15:55   CT ANGIO NECK W OR WO CONTRAST  Result Date: 07/08/2020 CLINICAL DATA:  Initial evaluation for acute stroke. EXAM: CT ANGIOGRAPHY HEAD  AND NECK TECHNIQUE: Multidetector CT imaging of the head and neck was performed using the standard protocol during bolus administration of intravenous contrast. Multiplanar CT image reconstructions and MIPs were obtained to evaluate the vascular anatomy. Carotid stenosis measurements (when applicable) are obtained utilizing NASCET criteria, using the distal internal carotid diameter as the denominator. CONTRAST:  10mL OMNIPAQUE IOHEXOL 350 MG/ML SOLN COMPARISON:  Previous CT and MRI from earlier the same day. FINDINGS: CTA NECK FINDINGS Aortic arch: Examination mildly degraded by motion artifact. Visualized aortic arch normal caliber with normal 3 vessel morphology. Mild-to-moderate atheromatous change about the arch and origin of the great vessels without hemodynamically significant stenosis. Right carotid system: Right CCA tortuous proximally but is widely patent to the bifurcation without stenosis. Mild atheromatous change about the right carotid bulb/proximal right ICA without significant stenosis. Right ICA patent distally without stenosis, dissection or occlusion. Left carotid system: Left CCA tortuous proximally but is widely patent to the bifurcation without stenosis. Bulky calcified plaque about the left carotid bulb with associated stenosis of up to approximately 50% by NASCET criteria. Left ICA patent distally without stenosis, dissection or occlusion. Vertebral arteries:  Both vertebral arteries arise from subclavian arteries. No proximal subclavian artery stenosis. Atheromatous change about the origins of both vertebral arteries with associated moderate stenosis on the right and no more than mild narrowing on the left. Vertebral arteries mildly tortuous but are otherwise patent without stenosis, dissection or occlusion. Skeleton: No visible acute osseous finding. No discrete or worrisome osseous lesions. Moderate cervical spondylosis at C4-5 through C6-7 without high-grade stenosis. Other neck: No other  acute soft tissue abnormality within the neck. 1.6 cm right thyroid nodule noted. In the setting of significant comorbidities or limited life expectancy, no follow-up recommended (ref: J Am Coll Radiol. 2015 Feb;12(2): 143-50). No other mass or adenopathy. Upper chest: Visualized upper chest demonstrates no acute finding. Centrilobular emphysematous changes. Review of the MIP images confirms the above findings CTA HEAD FINDINGS Anterior circulation: Petrous segments patent bilaterally. Atheromatous change throughout the carotid siphons with no more than mild narrowing. A1 segments patent bilaterally. Normal anterior communicating artery complex. Anterior cerebral arteries patent to their distal aspects without stenosis. No M1 stenosis or occlusion. Normal MCA bifurcations. Distal MCA branches well perfused and symmetric. Posterior circulation: Mild nonstenotic atheromatous plaque within the V4 segments bilaterally without significant stenosis. Both vertebral arteries patent to the vertebrobasilar junction. Left vertebral artery slightly dominant. Right PICA patent. Left PICA not definitely seen. Basilar patent to its distal aspect without stenosis. Superior cerebellar arteries patent bilaterally. Both PCAs primarily supplied via the basilar. PCAs widely patent proximally. Distal right PCA somewhat attenuated, in keeping with the acute right PCA distribution infarct. Venous sinuses: Grossly patent allowing for timing the contrast bolus. Anatomic variants: None significant.  No aneurysm. Review of the MIP images confirms the above findings IMPRESSION: 1. Negative CTA for emergent large vessel occlusion. 2. Bulky calcified plaque about the left carotid bulb with associated stenosis of up to approximately 50% by NASCET criteria. 3. Moderate right and mild left vertebral artery origin stenoses. 4. Diffuse tortuosity of the major arterial vasculature of the head and neck, suggesting chronic underlying hypertension. 5.  Aortic Atherosclerosis (ICD10-I70.0) and Emphysema (ICD10-J43.9). Electronically Signed   By: Jeannine Boga M.D.   On: 07/08/2020 00:13   MR BRAIN WO CONTRAST  Result Date: 07/07/2020 CLINICAL DATA:  Abnormal CT with stroke EXAM: MRI HEAD WITHOUT CONTRAST TECHNIQUE: Multiplanar, multiecho pulse sequences of the brain and surrounding structures were obtained without intravenous contrast. COMPARISON:  Correlation made with CT earlier same day FINDINGS: Brain: There is restricted diffusion within the right occipital lobe and posteromedial right temporal lobe. Chronic infarcts of the right cerebellum with chronic blood products. Prominence of the ventricles and sulci reflects generalized parenchymal volume loss. Patchy foci of T2 hyperintensity in the supratentorial white matter are nonspecific but probably reflect mild chronic microvascular ischemic changes. There is no intracranial mass or mass effect. No hydrocephalus or extra-axial collection. Scattered foci of susceptibility hypointensity in the cerebral white matter reflecting chronic microhemorrhages. Vascular: Major vessel flow voids at the skull base are preserved. Skull and upper cervical spine: Normal marrow signal is preserved. Sinuses/Orbits: Paranasal sinus mucosal thickening. Orbits are unremarkable. Other: Sella is unremarkable.  Mastoid air cells are clear. IMPRESSION: Acute right PCA territory infarction involving occipitotemporal lobes. Chronic right cerebellar infarcts with chronic blood products. Mild chronic microvascular ischemic changes. Scattered chronic microhemorrhages in a pattern more suggestive of amyloid angiopathy rather than hypertension. Electronically Signed   By: Macy Mis M.D.   On: 07/07/2020 18:10       LOS: 1 day   Tyler Norris  Tyler Norris  [facility], [email or web address]  07/08/2020, 9:11 AM

## 2020-07-08 NOTE — Evaluation (Signed)
Physical Therapy Evaluation Patient Details Name: Tyler Norris MRN: [id number] DOB: 08/01/1934 Today's Date: 07/08/2020   History of Present Illness  Pt is 86 yo male presenting with vision loss on 07/07/20. MRI showing  acute R PCA infartion involving occipitotemporal lobes. PMH including lupus, HTN, arthritis, fibromyalgia, AAA, and HLD.  Clinical Impression  Pt admitted with above diagnosis. Pt presenting with mild cognitive deficits, L visual field cut L eye, and deficits in balance and safety.  He is aware of vision deficits and compensated well with scanning environment.  Pt normally very independent and lives alone.  He does have multiple family members nearby that can assist initially.  Recommend HHPT initially. Pt currently with functional limitations due to the deficits listed below (see PT Problem List). Pt will benefit from skilled PT to increase their independence and safety with mobility to allow discharge to the venue listed below.       Follow Up Recommendations Home health PT;Supervision - Intermittent    Equipment Recommendations  None recommended by PT    Recommendations for Other Services       Precautions / Restrictions Precautions Precautions: Fall Precaution Comments: Left visual deficits      Mobility  Bed Mobility               General bed mobility comments: in chair at arrival    Transfers Overall transfer level: Needs assistance Equipment used: None Transfers: Sit to/from Stand Sit to Stand: Min guard         General transfer comment: Min Guard A for safety  Ambulation/Gait Ambulation/Gait assistance: Min Gaffer (Feet): 300 Feet Assistive device: None Gait Pattern/deviations: Step-through pattern Gait velocity: decreased   General Gait Details: min guard progressed to supervision; pt did well scanning environment for stationary obstacles, 1 episode where someone stepped out on L and pt needed  cues  Stairs Stairs: Yes Stairs assistance: Min guard Stair Management: One rail Right;Alternating pattern Number of Stairs: 3    Wheelchair Mobility    Modified Rankin (Stroke Patients Only) Modified Rankin (Stroke Patients Only) Pre-Morbid Rankin Score: No symptoms Modified Rankin: Moderate disability     Balance Overall balance assessment: Needs assistance   Sitting balance-Leahy Scale: Normal     Standing balance support: No upper extremity supported Standing balance-Leahy Scale: Good                   Standardized Balance Assessment Standardized Balance Assessment : Dynamic Gait Index   Dynamic Gait Index Level Surface: Normal Change in Gait Speed: Normal Gait with Horizontal Head Turns: Mild Impairment Gait with Vertical Head Turns: Mild Impairment Gait and Pivot Turn: Mild Impairment Step Over Obstacle: Moderate Impairment Step Around Obstacles: Moderate Impairment Steps: Mild Impairment Total Score: 16       Pertinent Vitals/Pain Pain Assessment: Faces Faces Pain Scale: Hurts little more Pain Location: abdomen (reports present for a few weeks and was supposed to have CT but now admitted for CVA) Pain Descriptors / Indicators: Discomfort Pain Intervention(s): Repositioned    Home Living Family/patient expects to be discharged to:: Private residence Living Arrangements: Alone Available Help at Discharge: Family;Available PRN/intermittently Type of Home: House Home Access: Stairs to enter Entrance Stairs-Rails: None Entrance Stairs-Number of Steps: 2 Home Layout: One level Home Equipment: Grab bars - tub/shower;Wheelchair - [employer] - 2 wheels;Shower seat;Cane - single point Additional Comments: Has a cat at home. Retired. was in aviation    Prior Function Level of Independence: Independent  Comments: ADLs, IADLs, driving     Hand Dominance   Dominant Hand: Right    Extremity/Trunk Assessment   Upper Extremity  Assessment Upper Extremity Assessment: Overall WFL for tasks assessed    Lower Extremity Assessment Lower Extremity Assessment: Overall WFL for tasks assessed    Cervical / Trunk Assessment Cervical / Trunk Assessment: Normal  Communication   Communication: No difficulties  Cognition Arousal/Alertness: Awake/alert Behavior During Therapy: WFL for tasks assessed/performed Overall Cognitive Status: Impaired/Different from baseline Area of Impairment: Memory;Problem solving                     Memory: Decreased short-term memory       Problem Solving: Slow processing;Difficulty sequencing General Comments: Pt reports difficulty finding rooms at home and had difficulty finding way back to room today.  1 mistake saying months backwards. Tangential at times.      General Comments General comments (skin integrity, edema, etc.): Pt demonstrating L field cut in L eye.  Normal visual fields R eye.   Educated on PT role for balance and safety.  DIscussed safety with scanning environment due to vision.  Pt also with mild higher level cognitive deficits - recommending supervision initially at home and assist with IADLs.    Exercises     Assessment/Plan    PT Assessment Patient needs continued PT services  PT Problem List Decreased mobility;Decreased safety awareness;Decreased balance       PT Treatment Interventions DME instruction;Therapeutic activities;Gait training;Therapeutic exercise;Patient/family education;Stair training;Balance training;Functional mobility training;Neuromuscular re-education;Cognitive remediation    PT Goals (Current goals can be found in the Care Plan section)  Acute Rehab PT Goals Patient Stated Goal: Return home PT Goal Formulation: With patient/family Time For Goal Achievement: 07/22/20 Potential to Achieve Goals: Good Additional Goals Additional Goal #1: Will score > 19 on DGI to indicate low fall risk    Frequency Min 4X/week   Barriers  to discharge        Co-evaluation               AM-PAC PT "6 Clicks" Mobility  Outcome Measure Help needed turning from your back to your side while in a flat bed without using bedrails?: None Help needed moving from lying on your back to sitting on the side of a flat bed without using bedrails?: None Help needed moving to and from a bed to a chair (including a wheelchair)?: A Little Help needed standing up from a chair using your arms (e.g., wheelchair or bedside chair)?: A Little Help needed to walk in hospital room?: A Little Help needed climbing 3-5 steps with a railing? : A Little 6 Click Score: 20    End of Session Equipment Utilized During Treatment: Gait belt Activity Tolerance: Patient tolerated treatment well Patient left: with chair alarm set;in chair;with call bell/phone within reach;with family/visitor present Nurse Communication: Mobility status PT Visit Diagnosis: Other abnormalities of gait and mobility (R26.89)    Time: 9735-3299 PT Time Calculation (min) (ACUTE ONLY): 27 min   Charges:   PT Evaluation $PT Eval Low Complexity: 1 Low PT Treatments $Gait Training: 8-22 mins        Abran Richard, PT Acute Rehab Services Pager [phone] [facility] 07/08/2020, 2:18 PM

## 2020-07-08 NOTE — Progress Notes (Signed)
Pt placed on cpap 

## 2020-07-08 NOTE — Evaluation (Addendum)
Occupational Therapy Evaluation Patient Details Name: Tyler Norris MRN: [id number] DOB: January 07, 1935 Today's Date: 07/08/2020    History of Present Illness 86 yo male presenting with vision loss. MRI showing  acute R PCA infartion involving occipitotemporal lobes. PMH including lupus, HTN, arthritis, fibromyalgia, AAA, and HLD.   Clinical Impression   PTA, pt was living alone and was independent. Pt currently requiring Supervision-Min Guard A for ADLs and functional mobility. Pt presenting with left visual field cut and deficits. Initiating education on compensatory techniques for visual deficits such as visual scanning and anchoring. Pt would benefit from further acute OT to facilitate safe dc. Recommend dc to home with HHOT to address visual deficits in the home and optimize safety, independence with ADLs, and return to PLOF.     Follow Up Recommendations  Home health OT;Supervision - Intermittent    Equipment Recommendations  None recommended by OT    Recommendations for Other Services PT consult     Precautions / Restrictions Precautions Precautions: Fall Precaution Comments: Left visual deficits      Mobility Bed Mobility Overal bed mobility: Needs Assistance Bed Mobility: Supine to Sit     Supine to sit: Min guard     General bed mobility comments: Min Guard A for safety    Transfers Overall transfer level: Needs assistance Equipment used: None Transfers: Sit to/from Stand Sit to Stand: Min guard         General transfer comment: Min Guard A for safety    Balance Overall balance assessment: Mild deficits observed, not formally tested                                         ADL either performed or assessed with clinical judgement   ADL Overall ADL's : Needs assistance/impaired     Grooming: Supervision/safety;Oral care;Wash/dry face   Upper Body Bathing: Supervision/ safety;Set up;Sitting   Lower Body Bathing: Min guard;Sit  to/from stand   Upper Body Dressing : Supervision/safety;Set up;Sitting   Lower Body Dressing: Min guard;Sit to/from stand   Toilet Transfer: Min guard;Ambulation           Functional mobility during ADLs: Min guard General ADL Comments: Pt performing ADLs adn functional mobility at [employer] A level     Vision Baseline Vision/History: Wears glasses Wears Glasses: Reading only;Distance only Patient Visual Report: Peripheral vision impairment;Other (comment) (L sided visual deficits) Vision Assessment?: Yes Eye Alignment: Within Functional Limits Alignment/Gaze Preference: Within Defined Limits Tracking/Visual Pursuits: Able to track stimulus in all quads without difficulty Convergence: Within functional limits Visual Fields: Left visual field deficit Additional Comments: L visual field cut. Able to see around ~20* laterally from midline.     Perception     Praxis      Pertinent Vitals/Pain Pain Assessment: Faces Faces Pain Scale: Hurts even more Pain Location: Back Pain Descriptors / Indicators: Constant;Discomfort;Grimacing Pain Intervention(s): Monitored during session;Repositioned     Hand Dominance Right   Extremity/Trunk Assessment Upper Extremity Assessment Upper Extremity Assessment: Overall WFL for tasks assessed   Lower Extremity Assessment Lower Extremity Assessment: Overall WFL for tasks assessed   Cervical / Trunk Assessment Cervical / Trunk Assessment: Normal   Communication Communication Communication: No difficulties   Cognition Arousal/Alertness: Awake/alert Behavior During Therapy: WFL for tasks assessed/performed Overall Cognitive Status: No family/caregiver present to determine baseline cognitive functioning  General Comments: Very methodical in though and asking questions about the food tray set up. Following cues and conversation well. Somewhat tangiental but feel this is age  appropriate. Reports memory deficits - "I think thats normal with my age" as well as spacio-orientation deficits at the begining of this episodes reporting "I would be in my room and couldnt locate the kitchen."   General Comments  Daughter calling during session and reports she plans on come and visit this morning. Initiating education on vision techniques such a scanning and anchoring. Pt very receptive. Educating pt that he will need someone to drive him places until he is cleared to drive.    Exercises     Shoulder Instructions      Home Living Family/patient expects to be discharged to:: Private residence Living Arrangements: Alone Available Help at Discharge: Family Type of Home: House Home Access: Stairs to enter Technical brewer of Steps: 2 Entrance Stairs-Rails: None Home Layout: One level     Bathroom Shower/Tub: Occupational psychologist: Standard     Home Equipment: Grab bars - tub/shower;Wheelchair - [employer] - 2 wheels;Shower seat;Cane - single point   Additional Comments: Has a kitty at home. Retired. was in aviation      Prior Functioning/Environment Level of Independence: Independent        Comments: ADLs, IADLs, driving        OT Problem List: Decreased activity tolerance;Impaired balance (sitting and/or standing);Impaired vision/perception;Decreased knowledge of use of DME or AE;Decreased knowledge of precautions      OT Treatment/Interventions: Self-care/ADL training;Therapeutic exercise;Energy conservation;DME and/or AE instruction;Therapeutic activities;Visual/perceptual remediation/compensation    OT Goals(Current goals can be found in the care plan section) Acute Rehab OT Goals Patient Stated Goal: Return home OT Goal Formulation: With patient Time For Goal Achievement: 07/22/20 Potential to Achieve Goals: Good  OT Frequency: Min 3X/week   Barriers to D/C:            Co-evaluation              AM-PAC OT "6  Clicks" Daily Activity     Outcome Measure Help from another person eating meals?: A Little Help from another person taking care of personal grooming?: A Little Help from another person toileting, which includes using toliet, bedpan, or urinal?: A Little Help from another person bathing (including washing, rinsing, drying)?: A Little Help from another person to put on and taking off regular upper body clothing?: A Little Help from another person to put on and taking off regular lower body clothing?: A Little 6 Click Score: 18   End of Session Nurse Communication: Mobility status  Activity Tolerance: Patient tolerated treatment well Patient left: in chair;with call bell/phone within reach  OT Visit Diagnosis: Unsteadiness on feet (R26.81);Other abnormalities of gait and mobility (R26.89)                Time: 2956-2130 OT Time Calculation (min): 40 min Charges:  OT General Charges $OT Visit: 1 Visit OT Evaluation $OT Eval Moderate Complexity: 1 Mod OT Treatments $Self Care/Home Management : 23-37 mins  Alanii Ramer MSOT, OTR/L Acute Rehab Pager: [phone] Office: [place] 07/08/2020, 9:28 AM

## 2020-07-09 ENCOUNTER — Inpatient Hospital Stay (HOSPITAL_COMMUNITY): Payer: Medicare Other

## 2020-07-09 ENCOUNTER — Other Ambulatory Visit: Payer: Self-pay | Admitting: Medical

## 2020-07-09 DIAGNOSIS — I639 Cerebral infarction, unspecified: Secondary | ICD-10-CM

## 2020-07-09 DIAGNOSIS — I6389 Other cerebral infarction: Secondary | ICD-10-CM | POA: Diagnosis not present

## 2020-07-09 HISTORY — PX: IR ANGIO VERTEBRAL SEL VERTEBRAL UNI L MOD SED: IMG5367

## 2020-07-09 HISTORY — PX: IR ANGIO INTRA EXTRACRAN SEL INTERNAL CAROTID BILAT MOD SED: IMG5363

## 2020-07-09 HISTORY — PX: IR US GUIDE VASC ACCESS RIGHT: IMG2390

## 2020-07-09 HISTORY — PX: IR ANGIO VERTEBRAL SEL SUBCLAVIAN INNOMINATE UNI R MOD SED: IMG5365

## 2020-07-09 LAB — GLUCOSE, CAPILLARY
Glucose-Capillary: 138 mg/dL — ABNORMAL HIGH (ref 70–99)
Glucose-Capillary: 109 mg/dL — ABNORMAL HIGH (ref 70–99)
Glucose-Capillary: 115 mg/dL — ABNORMAL HIGH (ref 70–99)
Glucose-Capillary: 141 mg/dL — ABNORMAL HIGH (ref 70–99)
Glucose-Capillary: 126 mg/dL — ABNORMAL HIGH (ref 70–99)

## 2020-07-09 LAB — BASIC METABOLIC PANEL
Anion gap: 7 (ref 5–15)
BUN: 19 mg/dL (ref 8–23)
CO2: 25 mmol/L (ref 22–32)
Calcium: 8.9 mg/dL (ref 8.9–10.3)
Chloride: 104 mmol/L (ref 98–111)
Creatinine, Ser: 1.02 mg/dL (ref 0.61–1.24)
GFR, Estimated: >60 mL/min (ref >60)
Glucose, Bld: 135 mg/dL — ABNORMAL HIGH (ref 70–99)
Potassium: 4 mmol/L (ref 3.5–5.1)
Sodium: 136 mmol/L (ref 135–145)

## 2020-07-09 LAB — CBC
HCT: 35 % — ABNORMAL LOW (ref 39.0–52.0)
Hemoglobin: 12 g/dL — ABNORMAL LOW (ref 13.0–17.0)
MCH: 31.3 pg (ref 26.0–34.0)
MCHC: 34.3 g/dL (ref 30.0–36.0)
MCV: 91.1 fL (ref 80.0–100.0)
Platelets: 169 10*3/uL (ref 150–400)
RBC: 3.84 MIL/uL — ABNORMAL LOW (ref 4.22–5.81)
RDW: 12 % (ref 11.5–15.5)
WBC: 6.8 10*3/uL (ref 4.0–10.5)
nRBC: 0 % (ref 0.0–0.2)

## 2020-07-09 LAB — ECHOCARDIOGRAM COMPLETE
Area-P 1/2: 2.66 cm2
Calc EF: 44.7 %
S' Lateral: 3.8 cm
Single Plane A2C EF: 46.3 %
Single Plane A4C EF: 40.5 %

## 2020-07-09 MED ORDER — HYDRALAZINE HCL 20 MG/ML IJ SOLN
INTRAMUSCULAR | Status: AC | PRN
Start: 2020-07-09 — End: 2020-07-09
  Administered 2020-07-09: 10 mg via INTRAVENOUS
  Administered 2020-07-09: 5 mg via INTRAVENOUS

## 2020-07-09 MED ORDER — IOHEXOL 240 MG/ML SOLN
150.0000 mL | Freq: Once | INTRAMUSCULAR | Status: AC | PRN
  Administered 2020-07-09: 90 mL via INTRAVENOUS

## 2020-07-09 MED ORDER — MIDAZOLAM HCL 2 MG/2ML IJ SOLN
INTRAMUSCULAR | Status: AC | PRN
  Administered 2020-07-09: 1 mg via INTRAVENOUS

## 2020-07-09 MED ORDER — FENTANYL CITRATE (PF) 100 MCG/2ML IJ SOLN
INTRAMUSCULAR | Status: AC
  Filled 2020-07-09: qty 2

## 2020-07-09 MED ORDER — IOHEXOL 300 MG/ML  SOLN
50.0000 mL | Freq: Once | INTRAMUSCULAR | Status: AC | PRN
  Administered 2020-07-09: 6 mL via INTRA_ARTERIAL

## 2020-07-09 MED ORDER — INSULIN ASPART 100 UNIT/ML IJ SOLN
0.0000 [IU] | Freq: Three times a day (TID) | INTRAMUSCULAR | Status: DC
  Administered 2020-07-09 – 2020-07-10 (×2): 2 [IU] via SUBCUTANEOUS
  Administered 2020-07-10: 3 [IU] via SUBCUTANEOUS

## 2020-07-09 MED ORDER — INSULIN ASPART 100 UNIT/ML IJ SOLN: 0.0000 [IU] | Freq: Every day | INTRAMUSCULAR | Status: DC

## 2020-07-09 MED ORDER — MIDAZOLAM HCL 2 MG/2ML IJ SOLN
INTRAMUSCULAR | Status: AC
  Filled 2020-07-09: qty 2

## 2020-07-09 MED ORDER — LIDOCAINE HCL (PF) 1 % IJ SOLN
INTRAMUSCULAR | Status: AC | PRN
  Administered 2020-07-09: 30 mL

## 2020-07-09 MED ORDER — IOHEXOL 240 MG/ML SOLN: 150.0000 mL | Freq: Once | INTRAMUSCULAR | Status: DC | PRN

## 2020-07-09 MED ORDER — LIDOCAINE HCL 1 % IJ SOLN
INTRAMUSCULAR | Status: AC
  Filled 2020-07-09: qty 20

## 2020-07-09 MED ORDER — IOHEXOL 240 MG/ML SOLN
INTRAMUSCULAR | Status: AC
  Administered 2020-07-09: 150 mL via INTRAVENOUS
  Filled 2020-07-09: qty 100

## 2020-07-09 MED ORDER — HYDRALAZINE HCL 20 MG/ML IJ SOLN
INTRAMUSCULAR | Status: AC
  Filled 2020-07-09: qty 1

## 2020-07-09 MED ORDER — FENTANYL CITRATE (PF) 100 MCG/2ML IJ SOLN
INTRAMUSCULAR | Status: AC | PRN
  Administered 2020-07-09: 25 ug via INTRAVENOUS

## 2020-07-09 MED ORDER — CLOPIDOGREL BISULFATE 75 MG PO TABS: 75.0000 mg | ORAL_TABLET | Freq: Every day | ORAL | 0 refills | Status: AC

## 2020-07-09 NOTE — TOC Initial Note (Signed)
Transition of Care [facility]) - Initial/Assessment Note    Patient Details  Name: Tyler Norris MRN: [id number] Date of Birth: Feb 17, 1935  Transition of Care [facility]) CM/SW Contact:    Geralynn Ochs, LCSW Phone Number: 07/09/2020, 3:03 PM  Clinical Narrative:      CSW met with patient's daughter at bedside and eventually patient as well after he returned from procedure. Patient's daughter asking about private duty agencies and CSW provided some information and discussed also checking out [email or web address] and [facility], as they have caregivers that are vetted. Daughter appreciative of information. CSW discussed home health and provided CMS choice list. Daughter would like to review agency choices and get back to CSW with decision. CSW to check back with daughter on who she'd like to use to setup home health. No DME recommendations at this time.               Expected Discharge Plan: [place] Barriers to Discharge: Continued Medical Work up   Patient Goals and CMS Choice Patient states their goals for this hospitalization and ongoing recovery are:: to get back home CMS Medicare.gov Compare Post Acute Care list provided to:: Patient Choice offered to / list presented to : Adult Children,Patient  Expected Discharge Plan and Services Expected Discharge Plan: [place] Choice: [place] arrangements for the past 2 months: Single Family Home                                      Prior Living Arrangements/Services Living arrangements for the past 2 months: Single Family Home Lives with:: Self Patient language and need for interpreter reviewed:: No Do you feel safe going back to the place where you live?: Yes      Need for Family Participation in Patient Care: No (Comment) Care giver support system in place?: Yes (comment)   Criminal Activity/Legal Involvement Pertinent to Current Situation/Hospitalization: No -  Comment as needed  Activities of Daily Living Home Assistive Devices/Equipment: Eyeglasses (reading glasses) ADL Screening (condition at time of admission) Patient's cognitive ability adequate to safely complete daily activities?: Yes Is the patient deaf or have difficulty hearing?: No Does the patient have difficulty seeing, even when wearing glasses/contacts?: Yes (has lost side vision on his left eye - started today) Does the patient have difficulty concentrating, remembering, or making decisions?: No (repeats some things in the past 2 years, some forgetfulness, pt verbalized that he did not know where the kitchen was) Patient able to express need for assistance with ADLs?: Yes Does the patient have difficulty dressing or bathing?: No Independently performs ADLs?: Yes (appropriate for developmental age) Does the patient have difficulty walking or climbing stairs?: Yes Weakness of Legs: None Weakness of Arms/Hands: None  Permission Sought/Granted Permission sought to share information with : Family Supports Permission granted to share information with : Yes, Verbal Permission Granted  Share Information with NAME: Di Kindle     Permission granted to share info w Relationship: Daughter     Emotional Assessment Appearance:: Appears stated age Attitude/Demeanor/Rapport: Engaged Affect (typically observed): Appropriate Orientation: : Oriented to Self,Oriented to Place,Oriented to  Time,Oriented to Situation Alcohol / Substance Use: Not Applicable Psych Involvement: No (comment)  Admission diagnosis:  CVA (cerebral vascular accident) ([place]) [I63.9] Acute ischemic stroke [facility]) [I63.9] Patient Active Problem List   Diagnosis Date  Noted  . CVA (cerebral vascular accident) ([place]) 07/07/2020  . SDH (subdural hematoma) ([place]) 01/08/2017  . Pleural effusion on left 06/02/2015  . Aneurysm of thoracic aorta ([place]) 06/02/2015  . SOB (shortness of breath) 08/22/2014  . Pericardial effusion  08/22/2014  . Lupus (systemic lupus erythematosus) ([place])   . 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 PE (pulmonary embolism) 06/05/2012  . Pleural effusion exudative 05/26/2012  . Essential hypertension 05/13/2012  . OSA on CPAP 05/13/2012  . Cardiomegaly 05/13/2012  . Abdominal aneurysm without mention of rupture 06/27/2011   PCP:  [name], MD Pharmacy:   [facility] - [address] [place] [address] Phone: [phone] Fax: [place], [place] - [address] [place] [address] Phone: [phone] Fax: [phone]  [facility] [place], [place] [address] [place] [address] Phone: [phone] Fax: [phone]     Social Determinants of Health (SDOH) Interventions    Readmission Risk Interventions No flowsheet data found.

## 2020-07-09 NOTE — Progress Notes (Signed)
Occupational Therapy Treatment Patient Details Name: ESTES LEHNER MRN: [id number] DOB: October 04, 1934 Today's Date: 07/09/2020    History of present illness 86 yo male presenting with vision loss on 07/07/20. MRI showing  acute R PCA infartion involving occipitotemporal lobes. PMH including lupus, HTN, arthritis, fibromyalgia, AAA, and HLD.   OT comments  Arriving to provide handout and continue education on compensatory techniques for visual deficits. Pt daughter present in room; pt at IR. Providing handout on visual deficits and techniques. Daughter with several questions. Reviewing handout and providing education on use of visual scanning techniques, anchoring, and home adaptations. Pt's daughter, Di Kindle, very appreciative. Discussed need for increased support at home during first couple of weeks. Continue to recommend dc with HHOT and will continue to follow acutely as admitted.    Follow Up Recommendations  Home health OT;Supervision - Intermittent    Equipment Recommendations  None recommended by OT    Recommendations for Other Services PT consult    Precautions / Restrictions         Mobility Bed Mobility                    Transfers                      Balance                                           ADL either performed or assessed with clinical judgement   ADL                                         General ADL Comments: Providing daughter with hand out and education on visual deficits and comepsnatory techniques to optimzie safety and independence. This included visual scanning techniques and anchoring which was discussed wiht patient at last session. Daughter very appreciative of all education and information. Discussed home adaptions to increased safety. Also discussed need for inceased supportfor first week or so as pt learns compensatory techniques     Vision       Perception     Praxis       Cognition                                                Exercises     Shoulder Instructions       General Comments Providing daughter with education and hadnout on vision techniques. See ADL section    Pertinent Vitals/ Pain          Home Living                                          Prior Functioning/Environment              Frequency  Min 3X/week        Progress Toward Goals  OT Goals(current goals can now be found in the care plan section)  Progress towards OT goals: Progressing toward goals  Acute Rehab OT Goals Patient Stated Goal: Return home OT Goal  Formulation: With patient Time For Goal Achievement: 07/22/20 Potential to Achieve Goals: Good ADL Goals Additional ADL Goal #1: Pt will utilize visual scanning and anchoring techniques during ADLs with 2-3 cues Additional ADL Goal #2: Pt will perform simple meal preparation task with Min cues Additional ADL Goal #3: Pt will perform four part trail making task with Min cues  Plan      Co-evaluation                 AM-PAC OT "6 Clicks" Daily Activity     Outcome Measure   Help from another person eating meals?: A Little Help from another person taking care of personal grooming?: A Little Help from another person toileting, which includes using toliet, bedpan, or urinal?: A Little Help from another person bathing (including washing, rinsing, drying)?: A Little Help from another person to put on and taking off regular upper body clothing?: A Little Help from another person to put on and taking off regular lower body clothing?: A Little 6 Click Score: 18    End of Session    OT Visit Diagnosis: Unsteadiness on feet (R26.81);Other abnormalities of gait and mobility (R26.89)   Activity Tolerance Patient tolerated treatment well   Patient Left     Nurse Communication          Time: 1440-1450 OT Time Calculation (min): 10 min  Charges: OT  General Charges $OT Visit: 1 Visit  Zeba, OTR/L Acute Rehab Pager: [phone] Office: [place] 07/09/2020, 4:43 PM

## 2020-07-09 NOTE — Progress Notes (Signed)
PT Cancellation Note  Patient Details Name: Tyler Norris MRN: [id number] DOB: [birth year]   Cancelled Treatment:    Reason Eval/Treat Not Completed: Patient at procedure or test/unavailable Patient continues to be off the unit. Will follow up with patient tomorrow.   [name] 07/09/2020, 1:14 PM

## 2020-07-09 NOTE — Progress Notes (Signed)
   Cardiology asked to place 30 day monitor to further evaluate a CVA. He has not followed with cardiology since 2016. Will place order for Dr. Gasper Sells to read as he is DOD in the hospital 07/09/20.   Abigail Butts, PA-C 07/09/20; 1:10 PM

## 2020-07-09 NOTE — Progress Notes (Addendum)
STROKE TEAM PROGRESS NOTE   INTERVAL HISTORY No acute events.  No family at the bedside.  Patient states he is willing for cerebral catheter angio today and is needed by mouth. Echocardiogram has been done but results are pending.  Continues to have left-sided peripheral vision loss.  His memory appears to be improving and 3 word recall is 2/3 today  Vitals:   07/09/20 0040 07/09/20 0337 07/09/20 0732 07/09/20 1130  BP:  140/80 129/80 138/80  Pulse: 70 77 78 75  Resp: 18 19 18 20   Temp:  (!) 97.5 F (36.4 C) 98.8 F (37.1 C) 98.2 F (36.8 C)  TempSrc:  Oral Oral Oral  SpO2: 98% 97% 99% 98%   CBC:  Recent Labs  Lab 07/07/20 1540 07/07/20 1549 07/09/20 0149  WBC 7.2  --  6.8  NEUTROABS 5.1  --   --   HGB 12.9* 12.2* 12.0*  HCT 38.4* 36.0* 35.0*  MCV 92.8  --  91.1  PLT 197  --  709   Basic Metabolic Panel:  Recent Labs  Lab 07/07/20 1540 07/07/20 1549 07/09/20 0149  NA 139 139 136  K 3.9 4.9 4.0  CL 104 103 104  CO2 26  --  25  GLUCOSE 118* 113* 135*  BUN 30* 39* 19  CREATININE 1.01 0.90 1.02  CALCIUM 10.0  --  8.9   Lipid Panel:  Recent Labs  Lab 07/08/20 0307  CHOL 147  TRIG 234*  HDL 33*  CHOLHDL 4.5  VLDL 47*  LDLCALC 67   HgbA1c:  Recent Labs  Lab 07/08/20 0307  HGBA1C 7.5*   Urine Drug Screen:  Recent Labs  Lab 07/07/20 1620  LABOPIA NONE DETECTED  COCAINSCRNUR NONE DETECTED  LABBENZ NONE DETECTED  AMPHETMU NONE DETECTED  THCU NONE DETECTED  LABBARB NONE DETECTED    Alcohol Level No results for input(s): ETH in the last 168 hours.  IMAGING past 24 hours  CTA Head and Neck 1. Negative CTA for emergent large vessel occlusion. 2. Bulky calcified plaque about the left carotid bulb with associated stenosis of up to approximately 50% by NASCET criteria. 3. Moderate right and mild left vertebral artery origin stenoses. 4. Diffuse tortuosity of the major arterial vasculature of the head and neck, suggesting chronic underlying  hypertension. 5. Aortic Atherosclerosis (ICD10-I70.0) and Emphysema (ICD10-J43.9).      PHYSICAL EXAM  Temp:  [97.5 F (36.4 C)-98.8 F (37.1 C)] 98.2 F (36.8 C) (05/13 1130) Pulse Rate:  [70-79] 75 (05/13 1130) Resp:  [18-20] 20 (05/13 1130) BP: (129-161)/(74-84) 138/80 (05/13 1130) SpO2:  [97 %-100 %] 98 % (05/13 1130)  General -pleasant mildly obese Caucasian elderly male sitting up in chair in no apparent distress.   Cardiovascular - Regular rhythm and rate  Mental Status -  Level of arousal and orientation to time, place, and person were intact.  Diminished recall 2/3.  Able to name 14 animals which can walk on 4 legs. Language including expression, naming, repetition, comprehension was assessed and found intact. Attention span and concentration were normal. Unable to recall 4/4 words. Able to name 13+ animals.  Cranial Nerves II - XII - II -dense visual field cut on the left.  III, IV, VI - Extraocular movements intact V - Facial sensation intact bilaterally VII - Facial movement intact bilaterally VIII - Hearing & vestibular intact bilaterally X - Palate elevates symmetrically XI - Shoulder shrug intact bilaterally XII - Tongue protrusion intact  Motor Strength - The patient's strength  was normal in all extremities and pronator drift was absent.  Bulk was normal and fasciculations were absent   Motor Tone - Muscle tone was assessed at the neck and appendages and was normal  Sensory - Light touch, temperature/pinprick were assessed and were symmetrical    Coordination - The patient had normal movements in the hands and feet with no ataxia or dysmetria.  Tremor was absent  Gait and Station - deferred.  ASSESSMENT/PLAN 86 yo with hx AAA, HTN, HL, SLE, OSA presented to [facility] ED after acute onset lateral hemifield VF deficit in L eye only associated with feeling off balance and some motor apraxia. Seen via telestroke. Outside of tPA window. NIHSS2.  Acute right PCA  territory infarction involving occipitotemporal lobes.  Code Stroke: Acute right occipital lobe infarct, old hemorrhagic infarct right cerebellar hemisphere  CTA head & neck: No LVO  MRI: Acute right PCA territory infarction involving occipitotemporal lobes.  2D Echo: Pending  LDL 67  HgbA1c 7.5  VTE prophylaxis - ambulatory, discharging home     Diet   Diet NPO time specified Except for: Sips with Meds    On ASA 81mg  prior to admission  DAPT x 3 weeks with ASA 81 and Plavix, then ASA alone  Therapy recommendations:  Home health PT  Disposition:  Home   Right vertebral artery origin stenosis, moderate possibly symptomatic  Will follow up outpatient with IR team for consideration of arteriogram to further evaluate  Hypertension . Permissive hypertension (OK if < 220/120) but gradually normalize in 5-7 days . Long-term BP goal normotensive  Hyperlipidemia  Home meds:  Crestor 2.5mg  daily   LDL 67, st goal < 70  High intensity statin not indicated due to adequate control on current regimen  Has statin allergy/intolerance listed to lipitor: myalgias  Continue statin at discharge  Diabetes type II Uncontrolled  HgbA1c 7.5, not quite at goal < 7.0  CBGs Recent Labs    07/09/20 0619  GLUCAP 138*      SSI  Other Stroke Risk Factors  Advanced Age >/= 45   Obesity, There is no height or weight on file to calculate BMI., BMI >/= 30 associated with increased stroke risk, recommend weight loss, diet and exercise as appropriate   Hx stroke apparent on imaging   Family hx stroke (sister)  Obstructive sleep apnea, on CPAP at home   Other Active Problems  Closed head injury following fall from roof in 2018 with trace subdural hemorrhage and ?  Delayed hemorrhagic contusion of the right cerebellum not seen on initial [facility] day # 2   Patient presented with a right posterior cerebral artery embolic infarct likely of cryptogenic etiology however the  CT angiogram does show moderate right vertebral artery ostial stenosis which needs further evaluation.  Recommend diagnostic cerebral catheter angiogram to evaluate this and if it is significant may consider an elective angioplasty stenting.  Recommend aspirin Plavix for 3 weeks followed by aspirin alone.  May need 30-day heart monitor at discharge for paroxysmal A. fib.  Continue   aggressive risk factor modification.  Long discussion with patient and Dr Norma Fredrickson   and answered questions.  Greater than 50% time during this 25-minute visit was spent on counseling and coordination of care about the stroke discussion about evaluation and treatment.  Discussed with Dr. [place] Pink Cerebral catheter angiogram shows only 50% right vertebral artery origin stenosis.  Recommend medical therapy for now and no need for elective angioplasty or stenting.  Discussed  with Dr. Ladean Raya.  Stroke team will sign off.  Kindly call for questions Antony Contras, MD Medical Director [place] Pager: [phone] 07/09/2020 11:32 AM   To contact Stroke Continuity provider, please refer to [email or web address] After hours, contact General Neurology

## 2020-07-09 NOTE — Progress Notes (Signed)
PT Cancellation Note  Patient Details Name: Tyler Norris MRN: [id number] DOB: 05/29/1934   Cancelled Treatment:    Reason Eval/Treat Not Completed: Patient at procedure or test/unavailable Will re-attempt to see patient later as appropriate.  Huong Luthi 07/09/2020, 9:30 AM

## 2020-07-09 NOTE — Consult Note (Signed)
Chief Complaint: Patient was seen in consultation today for right VA stenosis/diagnostic cerebral arteriogram.  Referring Physician(s): Charlene Brooke A (neurology)  Supervising Physician: Pedro Earls  Patient Status: [facility] - In-pt  History of Present Illness: Tyler Norris is a 86 y.o. male with a past medical history of hypertension, hyperlipidemia, pericardial effusion, AAA, pneumonia, Barrett's esophagus, GERD, nephrolithiasis, SLE, anemia, OSA, fibromyalgia, and arthritis. He presented to [facility] ED 07/07/2020 with complaints of left lateral visual field loss and dizziness. In ED, CT head revealed acute CVA (right PCA territory infarction involving the occipitotemporal lobes). Neurology was consulted and patient was admitted for further management. Further imaging studies revealed moderate right VA stenosis at the origin.  CTA head/neck 07/07/2020: 1. Negative CTA for emergent large vessel occlusion. 2. Bulky calcified plaque about the left carotid bulb with associated stenosis of up to approximately 50% by NASCET criteria. 3. Moderate right and mild left vertebral artery origin stenoses. 4. Diffuse tortuosity of the major arterial vasculature of the head and neck, suggesting chronic underlying hypertension. 5. Aortic Atherosclerosis (ICD10-I70.0) and Emphysema (ICD10-J43.9).  MR brain 07/07/2020: 1. Acute right PCA territory infarction involving occipitotemporal lobes. 2. Chronic right cerebellar infarcts with chronic blood products. 3. Mild chronic microvascular ischemic changes. 4. Scattered chronic microhemorrhages in a pattern more suggestive of amyloid angiopathy rather than hypertension.  CT head 07/07/2020: 1. Geographic area of decreased attenuation identified in the medial aspect of the right occipital lobe consistent with acute infarct. MRI may be helpful for further evaluation. 2. Area of encephalomalacia in the posteromedial aspect of the right  cerebellar hemisphere consistent with prior infarct. 3. Chronic atrophic and ischemic changes.  NIR consulted by Charlene Brooke, NP for possible image-guided diagnostic cerebral arteriogram. Patient awake and alert sitting in chair. Complains of mild RUQ pain with radiation to right shoulder. Denies N/V associated with abdominal pain. Denies fever, chills, chest pain, dyspnea, or headache.  Currently taking Plavix 75 mg once daily and Aspirin 81 mg once daily.   Past Medical History:  Diagnosis Date  . AAA (abdominal aortic aneurysm) ([place])    a. resolved by ct scan 09-2011  . Anemia   . Arthritis   . Barrett's esophagus   . Carpal tunnel syndrome, bilateral   . Essential hypertension   . Fibromyalgia   . GERD (gastroesophageal reflux disease)   . History of pneumonia   . Hyperlipidemia   . Lupus (systemic lupus erythematosus) ([place])   . Nephrolithiasis   . Pericardial effusion    a. 08/22/2014 Echo: EF 60-65%, mildly dil Ao root (8mm), mildly dil LA, mod-large partially organized pericardial effusion with possible early tamponade - asymptomatic on high dose nsaids-->conservative mgmt;  b. 08/2014 Limited Echo: EF 55-60%, Triv - small effusion.  . Sleep apnea    a. uses cpap every night    Past Surgical History:  Procedure Laterality Date  . APPENDECTOMY    . CARPAL TUNNEL RELEASE  10/12/2011   Procedure: CARPAL TUNNEL RELEASE;  Surgeon: Cammie Sickle., MD;  Location: [place];  Service: Orthopedics;  Laterality: Left;  . CARPAL TUNNEL RELEASE  12/01/2011   Procedure: CARPAL TUNNEL RELEASE;  Surgeon: Cammie Sickle., MD;  Location: [place];  Service: Orthopedics;  Laterality: Right;  . CYSTOSCOPY W/ STONE MANIPULATION  [id number]  . HAND SURGERY     scar repaired lt hand  . HERNIA REPAIR  1966  . KNEE ARTHROPLASTY    . KNEE ARTHROSCOPY  Left   . TONSILLECTOMY      Allergies: Atorvastatin and Lipitor [atorvastatin  calcium]  Medications: Prior to Admission medications   Medication Sig Start Date End Date Taking? Authorizing Provider  acetaminophen (TYLENOL) 500 MG tablet Take 500 mg by mouth every 8 (eight) hours as needed for mild pain.   Yes [provider]  acyclovir (ZOVIRAX) 400 MG tablet Take 400 mg by mouth 2 (two) times daily. 04/09/17  Yes [provider]  amLODipine (NORVASC) 5 MG tablet Take 1 tablet (5 mg total) daily by mouth. 01/11/17  Yes Meuth, Brooke A, PA-C  aspirin 81 MG tablet Take 81 mg by mouth daily.   Yes [provider]  Bioflavonoid Products (BIOFLEX PO) Take 1 tablet by mouth 2 (two) times daily.   Yes [provider]  calcium-vitamin D (OSCAL WITH D) 500-200 MG-UNIT tablet Take 1 tablet by mouth daily with breakfast.   Yes [provider]  docusate sodium (COLACE) 100 MG capsule Take 100 mg by mouth 2 (two) times daily as needed for mild constipation.   Yes [provider]  fenofibrate (TRICOR) 145 MG tablet Take 145 mg by mouth daily.   Yes [provider]  ferrous sulfate 325 (65 FE) MG tablet Take 325 mg by mouth 2 (two) times daily with a meal.   Yes [provider]  folic acid (FOLVITE) 235 MCG tablet Take 800 mcg by mouth daily.   Yes [provider]  hydroxychloroquine (PLAQUENIL) 200 MG tablet Take one tablet by mouth twice daily Monday-Friday only. 06/22/20  Yes Ofilia Neas, PA-C  magnesium oxide (MAG-OX) 400 (241.3 MG) MG tablet Take 1 tablet (400 mg total) by mouth 3 (three) times daily. 09/02/14  Yes Theora Gianotti, NP  metFORMIN (GLUCOPHAGE) 500 MG tablet Take 500 mg by mouth daily. 02/26/20  Yes [provider]  metoprolol tartrate (LOPRESSOR) 25 MG tablet Take 0.5 tablets (12.5 mg total) 2 (two) times daily by mouth. Patient taking differently: Take 25 mg by mouth 2 (two) times daily. 01/11/17  Yes Meuth, Brooke A, PA-C  Multiple Vitamin (MULTIVITAMIN WITH MINERALS)  TABS Take 1 tablet by mouth daily.   Yes [provider]  Omega-3 Fatty Acids (FISH OIL) 1200 MG CAPS Take 1,200 mg by mouth daily.    Yes [provider]  pantoprazole (PROTONIX) 40 MG tablet Take 40 mg by mouth 2 (two) times daily. 03/09/18  Yes [provider]  rosuvastatin (CRESTOR) 5 MG tablet Take 5 mg by mouth every other day.   Yes [provider]  tadalafil (CIALIS) 20 MG tablet Take 20 mg by mouth daily as needed for erectile dysfunction. 04/18/19  Yes [provider]  tamsulosin (FLOMAX) 0.4 MG CAPS capsule Take 0.4 mg by mouth daily.   Yes [provider]  telmisartan-hydrochlorothiazide (MICARDIS HCT) 80-12.5 MG tablet Take 1 tablet by mouth daily.   Yes [provider]  vitamin C (ASCORBIC ACID) 500 MG tablet Take 500 mg by mouth daily.   Yes [provider]  vitamin E 400 UNIT capsule Take 400 Units by mouth daily.   Yes [provider]  clopidogrel (PLAVIX) 75 MG tablet Take 1 tablet (75 mg total) by mouth daily for 21 days. 07/10/20 07/31/20  Bonnielee Haff, MD     Family History  Problem Relation Age of Onset  . Diabetes Mother   . Diabetes Sister   . Stroke Sister   . Healthy Daughter   . Healthy  Son     Social History   Socioeconomic History  . Marital status: Married    Spouse name: Not on file  . Number of children: Not on file  . Years of education: Not on file  . Highest education level: Not on file  Occupational History  . Not on file  Tobacco Use  . Smoking status: Never Smoker  . Smokeless tobacco: Never Used  Vaping Use  . Vaping Use: Never used  Substance and Sexual Activity  . Alcohol use: Yes    Comment: once every 3 months  . Drug use: Never  . Sexual activity: Not on file  Other Topics Concern  . Not on file  Social History Narrative  . Not on file   Social Determinants of Health   Financial Resource Strain: Not on file  Food Insecurity: Not on file   Transportation Needs: Not on file  Physical Activity: Not on file  Stress: Not on file  Social Connections: Not on file     Review of Systems: A 12 point ROS discussed and pertinent positives are indicated in the HPI above.  All other systems are negative.  Review of Systems  Constitutional: Negative for chills and fever.  Respiratory: Negative for shortness of breath and wheezing.   Cardiovascular: Negative for chest pain and palpitations.  Gastrointestinal: Positive for abdominal pain. Negative for nausea and vomiting.  Neurological: Negative for headaches.  Psychiatric/Behavioral: Negative for behavioral problems and confusion.    Vital Signs: BP 129/80 (BP Location: Left Arm)   Pulse 78   Temp 98.8 F (37.1 C) (Oral)   Resp 18   SpO2 99%   Physical Exam Vitals and nursing note reviewed.  Constitutional:      General: He is not in acute distress. Cardiovascular:     Rate and Rhythm: Normal rate and regular rhythm.     Heart sounds: Normal heart sounds. No murmur heard.   Pulmonary:     Effort: Pulmonary effort is normal. No respiratory distress.     Breath sounds: Normal breath sounds. No wheezing.  Skin:    General: Skin is warm and dry.  Neurological:     Mental Status: He is alert and oriented to person, place, and time.     Comments: Moving all extremities.      MD Evaluation Airway: WNL Heart: WNL Abdomen: WNL Chest/ Lungs: WNL ASA  Classification: 3 Mallampati/Airway Score: Two   Imaging: CT ANGIO HEAD W OR WO CONTRAST  Result Date: 07/08/2020 CLINICAL DATA:  Initial evaluation for acute stroke. EXAM: CT ANGIOGRAPHY HEAD AND NECK TECHNIQUE: Multidetector CT imaging of the head and neck was performed using the standard protocol during bolus administration of intravenous contrast. Multiplanar CT image reconstructions and MIPs were obtained to evaluate the vascular anatomy. Carotid stenosis measurements (when applicable) are obtained utilizing NASCET  criteria, using the distal internal carotid diameter as the denominator. CONTRAST:  55mL OMNIPAQUE IOHEXOL 350 MG/ML SOLN COMPARISON:  Previous CT and MRI from earlier the same day. FINDINGS: CTA NECK FINDINGS Aortic arch: Examination mildly degraded by motion artifact. Visualized aortic arch normal caliber with normal 3 vessel morphology. Mild-to-moderate atheromatous change about the arch and origin of the great vessels without hemodynamically significant stenosis. Right carotid system: Right CCA tortuous proximally but is widely patent to the bifurcation without stenosis. Mild atheromatous change about the right carotid bulb/proximal right ICA without significant stenosis. Right ICA patent distally without stenosis, dissection or occlusion. Left carotid system: Left CCA tortuous  proximally but is widely patent to the bifurcation without stenosis. Bulky calcified plaque about the left carotid bulb with associated stenosis of up to approximately 50% by NASCET criteria. Left ICA patent distally without stenosis, dissection or occlusion. Vertebral arteries: Both vertebral arteries arise from subclavian arteries. No proximal subclavian artery stenosis. Atheromatous change about the origins of both vertebral arteries with associated moderate stenosis on the right and no more than mild narrowing on the left. Vertebral arteries mildly tortuous but are otherwise patent without stenosis, dissection or occlusion. Skeleton: No visible acute osseous finding. No discrete or worrisome osseous lesions. Moderate cervical spondylosis at C4-5 through C6-7 without high-grade stenosis. Other neck: No other acute soft tissue abnormality within the neck. 1.6 cm right thyroid nodule noted. In the setting of significant comorbidities or limited life expectancy, no follow-up recommended (ref: J Am Coll Radiol. 2015 Feb;12(2): 143-50). No other mass or adenopathy. Upper chest: Visualized upper chest demonstrates no acute finding.  Centrilobular emphysematous changes. Review of the MIP images confirms the above findings CTA HEAD FINDINGS Anterior circulation: Petrous segments patent bilaterally. Atheromatous change throughout the carotid siphons with no more than mild narrowing. A1 segments patent bilaterally. Normal anterior communicating artery complex. Anterior cerebral arteries patent to their distal aspects without stenosis. No M1 stenosis or occlusion. Normal MCA bifurcations. Distal MCA branches well perfused and symmetric. Posterior circulation: Mild nonstenotic atheromatous plaque within the V4 segments bilaterally without significant stenosis. Both vertebral arteries patent to the vertebrobasilar junction. Left vertebral artery slightly dominant. Right PICA patent. Left PICA not definitely seen. Basilar patent to its distal aspect without stenosis. Superior cerebellar arteries patent bilaterally. Both PCAs primarily supplied via the basilar. PCAs widely patent proximally. Distal right PCA somewhat attenuated, in keeping with the acute right PCA distribution infarct. Venous sinuses: Grossly patent allowing for timing the contrast bolus. Anatomic variants: None significant.  No aneurysm. Review of the MIP images confirms the above findings IMPRESSION: 1. Negative CTA for emergent large vessel occlusion. 2. Bulky calcified plaque about the left carotid bulb with associated stenosis of up to approximately 50% by NASCET criteria. 3. Moderate right and mild left vertebral artery origin stenoses. 4. Diffuse tortuosity of the major arterial vasculature of the head and neck, suggesting chronic underlying hypertension. 5. Aortic Atherosclerosis (ICD10-I70.0) and Emphysema (ICD10-J43.9). Electronically Signed   By: Jeannine Boga M.D.   On: 07/08/2020 00:13   CT HEAD WO CONTRAST  Result Date: 07/07/2020 CLINICAL DATA:  Right visual difficulties, initial encounter EXAM: CT HEAD WITHOUT CONTRAST TECHNIQUE: Contiguous axial images were  obtained from the base of the skull through the vertex without intravenous contrast. COMPARISON:  01/07/2017 FINDINGS: Brain: Encephalomalacia is noted in the medial aspect of the right cerebellar hemisphere consistent with prior infarct. This is new from the prior exam. Mild atrophic changes are seen. Chronic white matter ischemic changes are noted as well. Previously seen para falcine subdural hematoma has resolved in the interval. No acute hemorrhage is noted. There is a geographic area of decreased attenuation identified in the right occipital lobe medially which was not visualized on the prior exam and suspicious for acute infarct. Vascular: No hyperdense vessel or unexpected calcification. Skull: Normal. Negative for fracture or focal lesion. Sinuses/Orbits: Orbits and their contents are within normal limits. Mild mucosal thickening is noted within the maxillary antra bilaterally. Other: None. IMPRESSION: Geographic area of decreased attenuation identified in the medial aspect of the right occipital lobe consistent with acute infarct. MRI may be helpful for further evaluation. Area  of encephalomalacia in the posteromedial aspect of the right cerebellar hemisphere consistent with prior infarct. Chronic atrophic and ischemic changes. Critical Value/emergent results were called by telephone at the time of interpretation on 07/07/2020 at 3:53 pm to Dr. Carmin Muskrat , who verbally acknowledged these results. Electronically Signed   By: Inez Catalina M.D.   On: 07/07/2020 15:55   CT ANGIO NECK W OR WO CONTRAST  Result Date: 07/08/2020 CLINICAL DATA:  Initial evaluation for acute stroke. EXAM: CT ANGIOGRAPHY HEAD AND NECK TECHNIQUE: Multidetector CT imaging of the head and neck was performed using the standard protocol during bolus administration of intravenous contrast. Multiplanar CT image reconstructions and MIPs were obtained to evaluate the vascular anatomy. Carotid stenosis measurements (when applicable)  are obtained utilizing NASCET criteria, using the distal internal carotid diameter as the denominator. CONTRAST:  71mL OMNIPAQUE IOHEXOL 350 MG/ML SOLN COMPARISON:  Previous CT and MRI from earlier the same day. FINDINGS: CTA NECK FINDINGS Aortic arch: Examination mildly degraded by motion artifact. Visualized aortic arch normal caliber with normal 3 vessel morphology. Mild-to-moderate atheromatous change about the arch and origin of the great vessels without hemodynamically significant stenosis. Right carotid system: Right CCA tortuous proximally but is widely patent to the bifurcation without stenosis. Mild atheromatous change about the right carotid bulb/proximal right ICA without significant stenosis. Right ICA patent distally without stenosis, dissection or occlusion. Left carotid system: Left CCA tortuous proximally but is widely patent to the bifurcation without stenosis. Bulky calcified plaque about the left carotid bulb with associated stenosis of up to approximately 50% by NASCET criteria. Left ICA patent distally without stenosis, dissection or occlusion. Vertebral arteries: Both vertebral arteries arise from subclavian arteries. No proximal subclavian artery stenosis. Atheromatous change about the origins of both vertebral arteries with associated moderate stenosis on the right and no more than mild narrowing on the left. Vertebral arteries mildly tortuous but are otherwise patent without stenosis, dissection or occlusion. Skeleton: No visible acute osseous finding. No discrete or worrisome osseous lesions. Moderate cervical spondylosis at C4-5 through C6-7 without high-grade stenosis. Other neck: No other acute soft tissue abnormality within the neck. 1.6 cm right thyroid nodule noted. In the setting of significant comorbidities or limited life expectancy, no follow-up recommended (ref: J Am Coll Radiol. 2015 Feb;12(2): 143-50). No other mass or adenopathy. Upper chest: Visualized upper chest  demonstrates no acute finding. Centrilobular emphysematous changes. Review of the MIP images confirms the above findings CTA HEAD FINDINGS Anterior circulation: Petrous segments patent bilaterally. Atheromatous change throughout the carotid siphons with no more than mild narrowing. A1 segments patent bilaterally. Normal anterior communicating artery complex. Anterior cerebral arteries patent to their distal aspects without stenosis. No M1 stenosis or occlusion. Normal MCA bifurcations. Distal MCA branches well perfused and symmetric. Posterior circulation: Mild nonstenotic atheromatous plaque within the V4 segments bilaterally without significant stenosis. Both vertebral arteries patent to the vertebrobasilar junction. Left vertebral artery slightly dominant. Right PICA patent. Left PICA not definitely seen. Basilar patent to its distal aspect without stenosis. Superior cerebellar arteries patent bilaterally. Both PCAs primarily supplied via the basilar. PCAs widely patent proximally. Distal right PCA somewhat attenuated, in keeping with the acute right PCA distribution infarct. Venous sinuses: Grossly patent allowing for timing the contrast bolus. Anatomic variants: None significant.  No aneurysm. Review of the MIP images confirms the above findings IMPRESSION: 1. Negative CTA for emergent large vessel occlusion. 2. Bulky calcified plaque about the left carotid bulb with associated stenosis of up to approximately 50%  by NASCET criteria. 3. Moderate right and mild left vertebral artery origin stenoses. 4. Diffuse tortuosity of the major arterial vasculature of the head and neck, suggesting chronic underlying hypertension. 5. Aortic Atherosclerosis (ICD10-I70.0) and Emphysema (ICD10-J43.9). Electronically Signed   By: Jeannine Boga M.D.   On: 07/08/2020 00:13   MR BRAIN WO CONTRAST  Result Date: 07/07/2020 CLINICAL DATA:  Abnormal CT with stroke EXAM: MRI HEAD WITHOUT CONTRAST TECHNIQUE: Multiplanar,  multiecho pulse sequences of the brain and surrounding structures were obtained without intravenous contrast. COMPARISON:  Correlation made with CT earlier same day FINDINGS: Brain: There is restricted diffusion within the right occipital lobe and posteromedial right temporal lobe. Chronic infarcts of the right cerebellum with chronic blood products. Prominence of the ventricles and sulci reflects generalized parenchymal volume loss. Patchy foci of T2 hyperintensity in the supratentorial white matter are nonspecific but probably reflect mild chronic microvascular ischemic changes. There is no intracranial mass or mass effect. No hydrocephalus or extra-axial collection. Scattered foci of susceptibility hypointensity in the cerebral white matter reflecting chronic microhemorrhages. Vascular: Major vessel flow voids at the skull base are preserved. Skull and upper cervical spine: Normal marrow signal is preserved. Sinuses/Orbits: Paranasal sinus mucosal thickening. Orbits are unremarkable. Other: Sella is unremarkable.  Mastoid air cells are clear. IMPRESSION: Acute right PCA territory infarction involving occipitotemporal lobes. Chronic right cerebellar infarcts with chronic blood products. Mild chronic microvascular ischemic changes. Scattered chronic microhemorrhages in a pattern more suggestive of amyloid angiopathy rather than hypertension. Electronically Signed   By: Macy Mis M.D.   On: 07/07/2020 18:10    Labs:  CBC: Recent Labs    09/29/19 1042 03/04/20 0000 07/07/20 1540 07/07/20 1549 07/09/20 0149  WBC 4.9 5.5 7.2  --  6.8  HGB 12.8* 13.1* 12.9* 12.2* 12.0*  HCT 38.5 38.0* 38.4* 36.0* 35.0*  PLT 213 206 197  --  169    COAGS: Recent Labs    07/07/20 1540  INR 1.1  APTT 29    BMP: Recent Labs    09/29/19 1042 03/04/20 0000 07/07/20 1540 07/07/20 1549 07/09/20 0149  NA 141 140 139 139 136  K 3.9 4.1 3.9 4.9 4.0  CL 105 105 104 103 104  CO2 24 26 26   --  25  GLUCOSE  188* 150* 118* 113* 135*  BUN 20 21 30* 39* 19  CALCIUM 9.7 9.7 10.0  --  8.9  CREATININE 1.02 0.94 1.01 0.90 1.02  GFRNONAA 68 75 >60  --  >60  GFRAA 78 87  --   --   --     LIVER FUNCTION TESTS: Recent Labs    09/29/19 1042 03/04/20 0000 07/07/20 1540  BILITOT 0.7 0.5 0.8  AST 15 17 19   ALT 12 9 15   ALKPHOS  --   --  33*  PROT 6.4 6.3 7.1  ALBUMIN  --   --  4.5     Assessment and Plan:  Acute CVA (right PCA territory infarction involving the occipitotemporal lobes) in setting of right VA origin stenosis. Plan for image-guided diagnostic cerebral arteriogram in IR with Dr. Karenann Cai tentatively for today pending IR scheduling. Patient is NPO. Afebrile and WBCs WNL. Ok to proceed with Plavix/Aspirin per Dr. Karenann Cai. INR 1.1 07/07/2020.  Risks and benefits of diagnostic cerebral angiogram were discussed with the patient including, but not limited to bleeding, infection, vascular injury, stroke, or contrast induced renal failure. This interventional procedure involves the use of X-rays and because  of the nature of the planned procedure, it is possible that we will have prolonged use of X-ray fluoroscopy. Potential radiation risks to you include (but are not limited to) the following: - A slightly elevated risk for cancer  several years later in life. This risk is typically less than 0.5% percent. This risk is low in comparison to the normal incidence of human cancer, which is 33% for women and 50% for men according to the [place]. - Radiation induced injury can include skin redness, resembling a rash, tissue breakdown / ulcers and hair loss (which can be temporary or permanent).  The likelihood of either of these occurring depends on the difficulty of the procedure and whether you are sensitive to radiation due to previous procedures, disease, or genetic conditions.  IF your procedure requires a prolonged use of radiation, you will be notified  and given written instructions for further action.  It is your responsibility to monitor the irradiated area for the 2 weeks following the procedure and to notify your physician if you are concerned that you have suffered a radiation induced injury.   All of the patient's questions were answered, patient is agreeable to proceed. Consent signed and in IR control room.   Thank you for this interesting consult.  I greatly enjoyed meeting Tyler Norris and look forward to participating in their care.  A copy of this report was sent to the requesting provider on this date.  Electronically Signed: Earley Abide, PA-C 07/09/2020, 9:41 AM   I spent a total of 20 Minutes in face to face in clinical consultation, greater than 50% of which was counseling/coordinating care for right VA stenosis/diagnostic cerebral arteriogram.

## 2020-07-09 NOTE — Progress Notes (Signed)
  Echocardiogram 2D Echocardiogram has been performed.  Johny Chess 07/09/2020, 10:07 AM

## 2020-07-09 NOTE — Sedation Documentation (Signed)
Patient transported to [place] at the bedside. Groin site assessed. Site is clean, dry and intact. No drainage noted from dressing. Soft to touch upon palpation. No hematoma noted. Distal pulses intact.

## 2020-07-09 NOTE — Progress Notes (Signed)
[facility] PROGRESS NOTE   Tyler Norris DUK:[id number] DOB: [birth year] DOA: 07/07/2020  PCP: [name], MD  Brief History/Interval Summary: 86 y.o. male with medical history significant of lupus, HTN, HLD. Presenting with left lateral visual field loss.  Patient was found to have an acute stroke.  He was hospitalized for further management.  Reason for Visit: Acute stroke  Consultants: Neurology.  Interventional radiology  Procedures:  Transthoracic echocardiogram has been ordered  Plan is for cerebral angiogram later today  Antibiotics: Anti-infectives (From admission, onward)   Start     Dose/Rate Route Frequency Ordered Stop   07/08/20 1015  hydroxychloroquine (PLAQUENIL) tablet 200 mg       Note to Pharmacy: Take one tablet by mouth twice daily Monday-Friday only.     200 mg Oral 2 times daily 07/08/20 3762        Subjective/Interval History: Patient mentions that he continues to have visual problems but no worsening is noted.  No other complaints offered.  Denies any nausea vomiting or abdominal pain currently.      Assessment/Plan:  Acute CVA in the right PCA territory Neurology is following.  CT angiogram head and neck suggested a 50% stenosis in the left carotid.  No intracranial stenosis of significance were noted.   Patient was on aspirin at home.  Neurology recommends aspirin and Plavix for 3 weeks followed by aspirin alone. LDL 67.  HbA1c 7.5.  Cerebral angiogram recommended by neurology due to large vessel disease noted on CT angiogram.  Plan is for cerebral angiogram later today. Patient remains on statin. Seen by PT and OT and home health is recommended.  Echocardiogram is pending.  Further management recommendations per neurology.  History of SLE Noted to be on hydroxychloroquine which will be continued.  Essential hypertension Currently permissive hypertension is being allowed.  Noted to be on amlodipine, metoprolol, Micardis at  home prior to admission.  Hyperlipidemia Continue Crestor.  LDL is 67.  Normocytic anemia Outpatient management.  On and off right-sided abdominal pain ongoing for 3 weeks Not an active issue here in the hospital.  Will defer to outpatient providers.  Obesity Estimated body mass index is 31.15 kg/m as calculated from the following:   Height as of 03/04/20: 5' 9.5" (1.765 m).   Weight as of 03/04/20: 97.1 kg.   DVT Prophylaxis: Initiate DVT prophylaxis Code Status: Full code Family Communication: Discussed with the patient.  No family at bedside Disposition Plan: Patient lives by himself.  PT and OT evaluation.  Home health recommended.  Status is: Inpatient  Remains inpatient appropriate because:Ongoing diagnostic testing needed not appropriate for outpatient work up and Inpatient level of care appropriate due to severity of illness   Dispo: The patient is from: Home              Anticipated d/c is to: Home              Patient currently is not medically stable to d/c.   Difficult to place patient No        Medications:  Scheduled: . aspirin EC  81 mg Oral Daily  . clopidogrel  75 mg Oral Daily  . fenofibrate  160 mg Oral Daily  . hydroxychloroquine  200 mg Oral BID  . insulin aspart  0-15 Units Subcutaneous TID WC  . insulin aspart  0-5 Units Subcutaneous QHS  . pantoprazole  40 mg Oral Daily  . rosuvastatin  5 mg Oral Daily   Continuous:  [id number] **OR** acetaminophen (TYLENOL) oral liquid 160 mg/5 mL **OR** acetaminophen, senna-docusate   Objective:  Vital Signs  Vitals:   07/08/20 2351 07/09/20 0040 07/09/20 0337 07/09/20 0732  BP: (!) 161/84  140/80 129/80  Pulse: 72 70 77 78  Resp: 19 18 19 18   Temp: 97.7 F (36.5 C)  (!) 97.5 F (36.4 C) 98.8 F (37.1 C)  TempSrc: Oral  Oral Oral  SpO2: 99% 98% 97% 99%    Intake/Output Summary (Last 24 hours) at 07/09/2020 1021 Last data filed at 07/09/2020 [id number] Gross per 24 hour  Intake 3807.96 ml   Output --  Net 3807.96 ml   There were no vitals filed for this visit.   General appearance: Awake alert.  In no distress Resp: Clear to auscultation bilaterally.  Normal effort Cardio: S1-S2 is normal regular.  No S3-S4.  No rubs murmurs or bruit GI: Abdomen is soft.  Nontender nondistended.  Bowel sounds are present normal.  No masses organomegaly Extremities: No edema.  Full range of motion of lower extremities. Neurologic: Alert and oriented x3.  No obvious focal deficits noted.     Lab Results:  Data Reviewed: I have personally reviewed following labs and imaging studies  CBC: Recent Labs  Lab 07/07/20 1540 07/07/20 1549 07/09/20 0149  WBC 7.2  --  6.8  NEUTROABS 5.1  --   --   HGB 12.9* 12.2* 12.0*  HCT 38.4* 36.0* 35.0*  MCV 92.8  --  91.1  PLT 197  --  [id number]    Basic Metabolic Panel: Recent Labs  Lab 07/07/20 1540 07/07/20 1549 07/09/20 0149  NA 139 139 136  K 3.9 4.9 4.0  CL 104 103 104  CO2 26  --  25  GLUCOSE 118* 113* 135*  BUN 30* 39* 19  CREATININE 1.01 0.90 1.02  CALCIUM 10.0  --  8.9    GFR: CrCl cannot be calculated (Unknown ideal weight.).  Liver Function Tests: Recent Labs  Lab 07/07/20 1540  AST 19  ALT 15  ALKPHOS 33*  BILITOT 0.8  PROT 7.1  ALBUMIN 4.5     Coagulation Profile: Recent Labs  Lab 07/07/20 1540  INR 1.1    HbA1C: Recent Labs    07/08/20 0307  HGBA1C 7.5*     Lipid Profile: Recent Labs    07/08/20 0307  CHOL 147  HDL 33*  LDLCALC 67  TRIG 234*  CHOLHDL 4.5     Recent Results (from the past 240 hour(s))  Resp Panel by RT-PCR (Flu A&B, Covid) Nasopharyngeal Swab     Status: None   Collection Time: 07/07/20  2:45 PM   Specimen: Nasopharyngeal Swab; Nasopharyngeal(NP) swabs in vial transport medium  Result Value Ref Range Status   SARS Coronavirus 2 by RT PCR NEGATIVE NEGATIVE Final    Comment: (NOTE) SARS-CoV-2 target nucleic acids are NOT DETECTED.  The SARS-CoV-2 RNA is generally  detectable in upper respiratory specimens during the acute phase of infection. The lowest concentration of SARS-CoV-2 viral copies this assay can detect is 138 copies/mL. A negative result does not preclude SARS-Cov-2 infection and should not be used as the sole basis for treatment or other patient management decisions. A negative result may occur with  improper specimen collection/handling, submission of specimen other than nasopharyngeal swab, presence of viral mutation(s) within the areas targeted by this assay, and inadequate number of viral copies(<138 copies/mL). A negative result must be combined with clinical observations, patient history, and epidemiological information. The expected result  is Negative.  Fact Sheet for Patients:  EntrepreneurPulse.com.au  Fact Sheet for Healthcare Providers:  IncredibleEmployment.be  This test is no t yet approved or cleared by the [place] FDA and  has been authorized for detection and/or diagnosis of SARS-CoV-2 by FDA under an Emergency Use Authorization (EUA). This EUA will remain  in effect (meaning this test can be used) for the duration of the COVID-19 declaration under Section 564(b)(1) of the Act, 21 U.S.C.section 360bbb-3(b)(1), unless the authorization is terminated  or revoked sooner.       Influenza A by PCR NEGATIVE NEGATIVE Final   Influenza B by PCR NEGATIVE NEGATIVE Final    Comment: (NOTE) The Xpert Xpress SARS-CoV-2/FLU/RSV plus assay is intended as an aid in the diagnosis of influenza from Nasopharyngeal swab specimens and should not be used as a sole basis for treatment. Nasal washings and aspirates are unacceptable for Xpert Xpress SARS-CoV-2/FLU/RSV testing.  Fact Sheet for Patients: EntrepreneurPulse.com.au  Fact Sheet for Healthcare Providers: IncredibleEmployment.be  This test is not yet approved or cleared by the [place] FDA  and has been authorized for detection and/or diagnosis of SARS-CoV-2 by FDA under an Emergency Use Authorization (EUA). This EUA will remain in effect (meaning this test can be used) for the duration of the COVID-19 declaration under Section 564(b)(1) of the Act, 21 U.S.C. section 360bbb-3(b)(1), unless the authorization is terminated or revoked.  Performed at [facility], [place] [address], [place], [place] [address]       Radiology Studies: CT ANGIO HEAD W OR WO CONTRAST  Result Date: 07/08/2020 CLINICAL DATA:  Initial evaluation for acute stroke. EXAM: CT ANGIOGRAPHY HEAD AND NECK TECHNIQUE: Multidetector CT imaging of the head and neck was performed using the standard protocol during bolus administration of intravenous contrast. Multiplanar CT image reconstructions and MIPs were obtained to evaluate the vascular anatomy. Carotid stenosis measurements (when applicable) are obtained utilizing NASCET criteria, using the distal internal carotid diameter as the denominator. CONTRAST:  57mL OMNIPAQUE IOHEXOL 350 MG/ML SOLN COMPARISON:  Previous CT and MRI from earlier the same day. FINDINGS: CTA NECK FINDINGS Aortic arch: Examination mildly degraded by motion artifact. Visualized aortic arch normal caliber with normal 3 vessel morphology. Mild-to-moderate atheromatous change about the arch and origin of the great vessels without hemodynamically significant stenosis. Right carotid system: Right CCA tortuous proximally but is widely patent to the bifurcation without stenosis. Mild atheromatous change about the right carotid bulb/proximal right ICA without significant stenosis. Right ICA patent distally without stenosis, dissection or occlusion. Left carotid system: Left CCA tortuous proximally but is widely patent to the bifurcation without stenosis. Bulky calcified plaque about the left carotid bulb with associated stenosis of up to approximately 50% by NASCET criteria. Left ICA patent  distally without stenosis, dissection or occlusion. Vertebral arteries: Both vertebral arteries arise from subclavian arteries. No proximal subclavian artery stenosis. Atheromatous change about the origins of both vertebral arteries with associated moderate stenosis on the right and no more than mild narrowing on the left. Vertebral arteries mildly tortuous but are otherwise patent without stenosis, dissection or occlusion. Skeleton: No visible acute osseous finding. No discrete or worrisome osseous lesions. Moderate cervical spondylosis at C4-5 through C6-7 without high-grade stenosis. Other neck: No other acute soft tissue abnormality within the neck. 1.6 cm right thyroid nodule noted. In the setting of significant comorbidities or limited life expectancy, no follow-up recommended (ref: J Am Coll Radiol. 2015 Feb;12(2): 143-50). No other mass or adenopathy. Upper chest: Visualized  upper chest demonstrates no acute finding. Centrilobular emphysematous changes. Review of the MIP images confirms the above findings CTA HEAD FINDINGS Anterior circulation: Petrous segments patent bilaterally. Atheromatous change throughout the carotid siphons with no more than mild narrowing. A1 segments patent bilaterally. Normal anterior communicating artery complex. Anterior cerebral arteries patent to their distal aspects without stenosis. No M1 stenosis or occlusion. Normal MCA bifurcations. Distal MCA branches well perfused and symmetric. Posterior circulation: Mild nonstenotic atheromatous plaque within the V4 segments bilaterally without significant stenosis. Both vertebral arteries patent to the vertebrobasilar junction. Left vertebral artery slightly dominant. Right PICA patent. Left PICA not definitely seen. Basilar patent to its distal aspect without stenosis. Superior cerebellar arteries patent bilaterally. Both PCAs primarily supplied via the basilar. PCAs widely patent proximally. Distal right PCA somewhat attenuated, in  keeping with the acute right PCA distribution infarct. Venous sinuses: Grossly patent allowing for timing the contrast bolus. Anatomic variants: None significant.  No aneurysm. Review of the MIP images confirms the above findings IMPRESSION: 1. Negative CTA for emergent large vessel occlusion. 2. Bulky calcified plaque about the left carotid bulb with associated stenosis of up to approximately 50% by NASCET criteria. 3. Moderate right and mild left vertebral artery origin stenoses. 4. Diffuse tortuosity of the major arterial vasculature of the head and neck, suggesting chronic underlying hypertension. 5. Aortic Atherosclerosis (ICD10-I70.0) and Emphysema (ICD10-J43.9). Electronically Signed   By: Jeannine Boga M.D.   On: 07/08/2020 00:13   CT HEAD WO CONTRAST  Result Date: 07/07/2020 CLINICAL DATA:  Right visual difficulties, initial encounter EXAM: CT HEAD WITHOUT CONTRAST TECHNIQUE: Contiguous axial images were obtained from the base of the skull through the vertex without intravenous contrast. COMPARISON:  01/07/2017 FINDINGS: Brain: Encephalomalacia is noted in the medial aspect of the right cerebellar hemisphere consistent with prior infarct. This is new from the prior exam. Mild atrophic changes are seen. Chronic white matter ischemic changes are noted as well. Previously seen para falcine subdural hematoma has resolved in the interval. No acute hemorrhage is noted. There is a geographic area of decreased attenuation identified in the right occipital lobe medially which was not visualized on the prior exam and suspicious for acute infarct. Vascular: No hyperdense vessel or unexpected calcification. Skull: Normal. Negative for fracture or focal lesion. Sinuses/Orbits: Orbits and their contents are within normal limits. Mild mucosal thickening is noted within the maxillary antra bilaterally. Other: None. IMPRESSION: Geographic area of decreased attenuation identified in the medial aspect of the right  occipital lobe consistent with acute infarct. MRI may be helpful for further evaluation. Area of encephalomalacia in the posteromedial aspect of the right cerebellar hemisphere consistent with prior infarct. Chronic atrophic and ischemic changes. Critical Value/emergent results were called by telephone at the time of interpretation on 07/07/2020 at 3:53 pm to Dr. Carmin Muskrat , who verbally acknowledged these results. Electronically Signed   By: Inez Catalina M.D.   On: 07/07/2020 15:55   CT ANGIO NECK W OR WO CONTRAST  Result Date: 07/08/2020 CLINICAL DATA:  Initial evaluation for acute stroke. EXAM: CT ANGIOGRAPHY HEAD AND NECK TECHNIQUE: Multidetector CT imaging of the head and neck was performed using the standard protocol during bolus administration of intravenous contrast. Multiplanar CT image reconstructions and MIPs were obtained to evaluate the vascular anatomy. Carotid stenosis measurements (when applicable) are obtained utilizing NASCET criteria, using the distal internal carotid diameter as the denominator. CONTRAST:  24mL OMNIPAQUE IOHEXOL 350 MG/ML SOLN COMPARISON:  Previous CT and MRI from earlier the  same day. FINDINGS: CTA NECK FINDINGS Aortic arch: Examination mildly degraded by motion artifact. Visualized aortic arch normal caliber with normal 3 vessel morphology. Mild-to-moderate atheromatous change about the arch and origin of the great vessels without hemodynamically significant stenosis. Right carotid system: Right CCA tortuous proximally but is widely patent to the bifurcation without stenosis. Mild atheromatous change about the right carotid bulb/proximal right ICA without significant stenosis. Right ICA patent distally without stenosis, dissection or occlusion. Left carotid system: Left CCA tortuous proximally but is widely patent to the bifurcation without stenosis. Bulky calcified plaque about the left carotid bulb with associated stenosis of up to approximately 50% by NASCET  criteria. Left ICA patent distally without stenosis, dissection or occlusion. Vertebral arteries: Both vertebral arteries arise from subclavian arteries. No proximal subclavian artery stenosis. Atheromatous change about the origins of both vertebral arteries with associated moderate stenosis on the right and no more than mild narrowing on the left. Vertebral arteries mildly tortuous but are otherwise patent without stenosis, dissection or occlusion. Skeleton: No visible acute osseous finding. No discrete or worrisome osseous lesions. Moderate cervical spondylosis at C4-5 through C6-7 without high-grade stenosis. Other neck: No other acute soft tissue abnormality within the neck. 1.6 cm right thyroid nodule noted. In the setting of significant comorbidities or limited life expectancy, no follow-up recommended (ref: J Am Coll Radiol. 2015 Feb;12(2): 143-50). No other mass or adenopathy. Upper chest: Visualized upper chest demonstrates no acute finding. Centrilobular emphysematous changes. Review of the MIP images confirms the above findings CTA HEAD FINDINGS Anterior circulation: Petrous segments patent bilaterally. Atheromatous change throughout the carotid siphons with no more than mild narrowing. A1 segments patent bilaterally. Normal anterior communicating artery complex. Anterior cerebral arteries patent to their distal aspects without stenosis. No M1 stenosis or occlusion. Normal MCA bifurcations. Distal MCA branches well perfused and symmetric. Posterior circulation: Mild nonstenotic atheromatous plaque within the V4 segments bilaterally without significant stenosis. Both vertebral arteries patent to the vertebrobasilar junction. Left vertebral artery slightly dominant. Right PICA patent. Left PICA not definitely seen. Basilar patent to its distal aspect without stenosis. Superior cerebellar arteries patent bilaterally. Both PCAs primarily supplied via the basilar. PCAs widely patent proximally. Distal right  PCA somewhat attenuated, in keeping with the acute right PCA distribution infarct. Venous sinuses: Grossly patent allowing for timing the contrast bolus. Anatomic variants: None significant.  No aneurysm. Review of the MIP images confirms the above findings IMPRESSION: 1. Negative CTA for emergent large vessel occlusion. 2. Bulky calcified plaque about the left carotid bulb with associated stenosis of up to approximately 50% by NASCET criteria. 3. Moderate right and mild left vertebral artery origin stenoses. 4. Diffuse tortuosity of the major arterial vasculature of the head and neck, suggesting chronic underlying hypertension. 5. Aortic Atherosclerosis (ICD10-I70.0) and Emphysema (ICD10-J43.9). Electronically Signed   By: Jeannine Boga M.D.   On: 07/08/2020 00:13   MR BRAIN WO CONTRAST  Result Date: 07/07/2020 CLINICAL DATA:  Abnormal CT with stroke EXAM: MRI HEAD WITHOUT CONTRAST TECHNIQUE: Multiplanar, multiecho pulse sequences of the brain and surrounding structures were obtained without intravenous contrast. COMPARISON:  Correlation made with CT earlier same day FINDINGS: Brain: There is restricted diffusion within the right occipital lobe and posteromedial right temporal lobe. Chronic infarcts of the right cerebellum with chronic blood products. Prominence of the ventricles and sulci reflects generalized parenchymal volume loss. Patchy foci of T2 hyperintensity in the supratentorial white matter are nonspecific but probably reflect mild chronic microvascular ischemic changes. There is no  intracranial mass or mass effect. No hydrocephalus or extra-axial collection. Scattered foci of susceptibility hypointensity in the cerebral white matter reflecting chronic microhemorrhages. Vascular: Major vessel flow voids at the skull base are preserved. Skull and upper cervical spine: Normal marrow signal is preserved. Sinuses/Orbits: Paranasal sinus mucosal thickening. Orbits are unremarkable. Other: Sella is  unremarkable.  Mastoid air cells are clear. IMPRESSION: Acute right PCA territory infarction involving occipitotemporal lobes. Chronic right cerebellar infarcts with chronic blood products. Mild chronic microvascular ischemic changes. Scattered chronic microhemorrhages in a pattern more suggestive of amyloid angiopathy rather than hypertension. Electronically Signed   By: Macy Mis M.D.   On: 07/07/2020 18:10       LOS: 2 days   [facility] Pager on [email or web address]  07/09/2020, 10:21 AM

## 2020-07-09 NOTE — Sedation Documentation (Signed)
5 fr Exoseal deployed

## 2020-07-10 DIAGNOSIS — I639 Cerebral infarction, unspecified: Secondary | ICD-10-CM | POA: Diagnosis not present

## 2020-07-10 LAB — CBC
HCT: 35.6 % — ABNORMAL LOW (ref 39.0–52.0)
Hemoglobin: 12.1 g/dL — ABNORMAL LOW (ref 13.0–17.0)
MCH: 30.7 pg (ref 26.0–34.0)
MCHC: 34 g/dL (ref 30.0–36.0)
MCV: 90.4 fL (ref 80.0–100.0)
Platelets: 177 10*3/uL (ref 150–400)
RBC: 3.94 MIL/uL — ABNORMAL LOW (ref 4.22–5.81)
RDW: 12.2 % (ref 11.5–15.5)
WBC: 6.2 10*3/uL (ref 4.0–10.5)
nRBC: 0 % (ref 0.0–0.2)

## 2020-07-10 LAB — BASIC METABOLIC PANEL
Anion gap: 10 (ref 5–15)
BUN: 18 mg/dL (ref 8–23)
CO2: 21 mmol/L — ABNORMAL LOW (ref 22–32)
Calcium: 9 mg/dL (ref 8.9–10.3)
Chloride: 105 mmol/L (ref 98–111)
Creatinine, Ser: 0.98 mg/dL (ref 0.61–1.24)
GFR, Estimated: >60 mL/min (ref >60)
Glucose, Bld: 125 mg/dL — ABNORMAL HIGH (ref 70–99)
Potassium: 3.5 mmol/L (ref 3.5–5.1)
Sodium: 136 mmol/L (ref 135–145)

## 2020-07-10 LAB — GLUCOSE, CAPILLARY
Glucose-Capillary: 125 mg/dL — ABNORMAL HIGH (ref 70–99)
Glucose-Capillary: 129 mg/dL — ABNORMAL HIGH (ref 70–99)
Glucose-Capillary: 195 mg/dL — ABNORMAL HIGH (ref 70–99)

## 2020-07-10 MED ORDER — POTASSIUM CHLORIDE CRYS ER 20 MEQ PO TBCR
40.0000 meq | EXTENDED_RELEASE_TABLET | Freq: Once | ORAL | Status: AC
  Administered 2020-07-10: 40 meq via ORAL
  Filled 2020-07-10: qty 2

## 2020-07-10 NOTE — Progress Notes (Signed)
Occupational Therapy Treatment Patient Details Name: Tyler Norris MRN: [id number] DOB: 09-29-1934 Today's Date: 07/10/2020    History of present illness 86 yo male presenting with vision loss on 07/07/20. MRI showing  acute R PCA infartion involving occipitotemporal lobes. PMH including lupus, HTN, arthritis, fibromyalgia, AAA, and HLD.   OT comments  Pt progressing towards established OT goals. Pt performing LB dressing and functional mobility with Supervision. Reviewing handout on visual compensatory technique; pt verbalized understanding. Answering patient and daughter's questions in preparation for DC. Continue to recommend dc to home with HHOT.    Follow Up Recommendations  Home health OT;Supervision - Intermittent    Equipment Recommendations  None recommended by OT    Recommendations for Other Services PT consult    Precautions / Restrictions Precautions Precautions: Fall Precaution Comments: Left visual deficits       Mobility Bed Mobility               General bed mobility comments: Sitting at EOB upon arrival    Transfers Overall transfer level: Needs assistance Equipment used: None Transfers: Sit to/from Stand Sit to Stand: Supervision         General transfer comment: Supervision for safety    Balance Overall balance assessment: Needs assistance   Sitting balance-Leahy Scale: Normal     Standing balance support: No upper extremity supported Standing balance-Leahy Scale: Good                             ADL either performed or assessed with clinical judgement   ADL Overall ADL's : Needs assistance/impaired                     Lower Body Dressing: Supervision/safety;Sit to/from stand Lower Body Dressing Details (indicate cue type and reason): donning apnts Toilet Transfer: Supervision/safety;Ambulation (simulated to transport chair)           Functional mobility during ADLs: Supervision/safety General ADL  Comments: Reviewing handout on compensatory techniques for L visual field deficits. Pt also performing LB dressing and functional mobility with Supervision (P)     Vision   Vision Assessment?: Yes Eye Alignment: Within Functional Limits Alignment/Gaze Preference: Within Defined Limits Tracking/Visual Pursuits: Able to track stimulus in all quads without difficulty Convergence: Within functional limits Visual Fields: Left visual field deficit Additional Comments: L visual field cut   Perception     Praxis      Cognition Arousal/Alertness: Awake/alert Behavior During Therapy: WFL for tasks assessed/performed Overall Cognitive Status: Impaired/Different from baseline Area of Impairment: Memory;Problem solving                     Memory: Decreased short-term memory       Problem Solving: Slow processing;Difficulty sequencing General Comments: Following commands. maintaining conversation. Continues to reports decreased memory.        Exercises     Shoulder Instructions       General Comments Daughter present    Pertinent Vitals/ Pain       Pain Assessment: Faces Faces Pain Scale: Hurts little more Pain Location: abdomen (reports present for a few weeks and was supposed to have CT but now admitted for CVA) Pain Descriptors / Indicators: Discomfort Pain Intervention(s): Monitored during session;Limited activity within patient's tolerance;Repositioned  Home Living  Prior Functioning/Environment              Frequency  Min 3X/week        Progress Toward Goals  OT Goals(current goals can now be found in the care plan section)  Progress towards OT goals: Progressing toward goals  Acute Rehab OT Goals Patient Stated Goal: Return home OT Goal Formulation: With patient Time For Goal Achievement: 07/22/20 Potential to Achieve Goals: Good ADL Goals Additional ADL Goal #1: Pt will utilize  visual scanning and anchoring techniques during ADLs with 2-3 cues Additional ADL Goal #2: Pt will perform simple meal preparation task with Min cues Additional ADL Goal #3: Pt will perform four part trail making task with Min cues  Plan Discharge plan remains appropriate    Co-evaluation                 AM-PAC OT "6 Clicks" Daily Activity     Outcome Measure   Help from another person eating meals?: A Little Help from another person taking care of personal grooming?: A Little Help from another person toileting, which includes using toliet, bedpan, or urinal?: A Little Help from another person bathing (including washing, rinsing, drying)?: A Little Help from another person to put on and taking off regular upper body clothing?: A Little Help from another person to put on and taking off regular lower body clothing?: A Little 6 Click Score: 18    End of Session    OT Visit Diagnosis: Unsteadiness on feet (R26.81);Other abnormalities of gait and mobility (R26.89)   Activity Tolerance Patient tolerated treatment well   Patient Left with nursing/sitter in room (transport chair)   Nurse Communication Mobility status        Time: 2229-7989 OT Time Calculation (min): 18 min  Charges: OT General Charges $OT Visit: 1 Visit OT Treatments $Self Care/Home Management : 8-22 mins  [place], OTR/L Acute Rehab Pager: [phone] Office: [place] 07/10/2020, 2:04 PM

## 2020-07-10 NOTE — Discharge Summary (Signed)
[facility]  Physician Discharge Summary   Patient ID: Tyler Norris MRN: [id number] DOB/AGE: 1934/06/08 86 y.o.  Admit date: 07/07/2020 Discharge date: 07/11/2020  PCP: [name], MD  DISCHARGE DIAGNOSES:  Acute CVA within the right PCA territory History of SLE Essential hypertension Normocytic anemia Hyperlipidemia  RECOMMENDATIONS FOR OUTPATIENT FOLLOW UP: 1. Ambulatory referral sent to neurology outpatient follow-up 2. Home health has been ordered 3. [facility] was called to arrange 30-day monitor as recommended by neurology.    Home Health: PT OT SLP Equipment/Devices: None  CODE STATUS: Full code  DISCHARGE CONDITION: fair  Diet recommendation: Heart healthy  INITIAL HISTORY: 86 y.o.malewith medical history significant oflupus, HTN, HLD. Presenting with left lateral visual field loss.  Patient was found to have an acute stroke.  He was hospitalized for further management.  Consultants: Neurology.  Interventional radiology  Procedures:  Transthoracic echocardiogram See report below  Cerebral angiogram IMPRESSION: 1. 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 2. Approximately 50% stenosis at the origin of the right vertebral artery. 3. No significant stenosis at the origin of the left vertebral artery. 4. Atherosclerotic changes of the bilateral carotid bifurcation without hemodynamically significant stenosis.    HOSPITAL COURSE:   Acute CVA in the right PCA territory Patient was seen by neurology who managed his care in the hospital.  CT angiogram head and neck suggested a 50% stenosis in the left carotid.  No intracranial stenosis of significance were noted.  Patient was on aspirin at home.  Neurology recommends aspirin and Plavix for 3 weeks followed by aspirin alone. LDL 67.  HbA1c 7.5.  Cerebral angiogram recommended by neurology due to large  vessel disease noted on CT angiogram.    This was completed and showed a 50% stenosis at the origin of the right vertebral artery.  Plan is for medical management only. Patient remains on statin. Seen by PT and OT and home health is recommended.    Echocardiogram shows normal systolic function with grade 1 diastolic dysfunction.  No significant valvular abnormalities noted. 30-day heart monitor recommended by neurology.  Cardiology office was contacted about the same.  History of SLE Continue hydroxychloroquine  Essential hypertension May resume home medications  Hyperlipidemia Continue Crestor.  LDL is 67.  Normocytic anemia Outpatient management.  On and off right-sided abdominal pain ongoing for 3 weeks Not an active issue here in the hospital.  Will defer to outpatient providers.  Obesity Estimated body mass index is 31.15 kg/m as calculated from the following:   Height as of 03/04/20: 5' 9.5" (1.765 m).   Weight as of 03/04/20: 97.1 kg.  Overall stable.  Okay for discharge home today.   PERTINENT LABS:  The results of significant diagnostics from this hospitalization (including imaging, microbiology, ancillary and laboratory) are listed below for reference.    Microbiology: Recent Results (from the past 240 hour(s))  Resp Panel by RT-PCR (Flu A&B, Covid) Nasopharyngeal Swab     Status: None   Collection Time: 07/07/20  2:45 PM   Specimen: Nasopharyngeal Swab; Nasopharyngeal(NP) swabs in vial transport medium  Result Value Ref Range Status   SARS Coronavirus 2 by RT PCR NEGATIVE NEGATIVE Final    Comment: (NOTE) SARS-CoV-2 target nucleic acids are NOT DETECTED.  The SARS-CoV-2 RNA is generally detectable in upper respiratory specimens during the acute phase of infection. The lowest concentration of SARS-CoV-2 viral copies this assay can detect is 138 copies/mL. A negative result  does not preclude SARS-Cov-2 infection and should not be used as the sole basis for  treatment or other patient management decisions. A negative result may occur with  improper specimen collection/handling, submission of specimen other than nasopharyngeal swab, presence of viral mutation(s) within the areas targeted by this assay, and inadequate number of viral copies(<138 copies/mL). A negative result must be combined with clinical observations, patient history, and epidemiological information. The expected result is Negative.  Fact Sheet for Patients:  EntrepreneurPulse.com.au  Fact Sheet for Healthcare Providers:  IncredibleEmployment.be  This test is no t yet approved or cleared by the [place] FDA and  has been authorized for detection and/or diagnosis of SARS-CoV-2 by FDA under an Emergency Use Authorization (EUA). This EUA will remain  in effect (meaning this test can be used) for the duration of the COVID-19 declaration under Section 564(b)(1) of the Act, 21 U.S.C.section 360bbb-3(b)(1), unless the authorization is terminated  or revoked sooner.       Influenza A by PCR NEGATIVE NEGATIVE Final   Influenza B by PCR NEGATIVE NEGATIVE Final    Comment: (NOTE) The Xpert Xpress SARS-CoV-2/FLU/RSV plus assay is intended as an aid in the diagnosis of influenza from Nasopharyngeal swab specimens and should not be used as a sole basis for treatment. Nasal washings and aspirates are unacceptable for Xpert Xpress SARS-CoV-2/FLU/RSV testing.  Fact Sheet for Patients: EntrepreneurPulse.com.au  Fact Sheet for Healthcare Providers: IncredibleEmployment.be  This test is not yet approved or cleared by the [place] FDA and has been authorized for detection and/or diagnosis of SARS-CoV-2 by FDA under an Emergency Use Authorization (EUA). This EUA will remain in effect (meaning this test can be used) for the duration of the COVID-19 declaration under Section 564(b)(1) of the Act, 21  U.S.C. section 360bbb-3(b)(1), unless the authorization is terminated or revoked.  Performed at [facility], [place] [address], [place], [place] [address]      Labs:  COVID-19 Labs   Lab Results  Component Value Date   [place] NEGATIVE 07/07/2020      Basic Metabolic Panel: Recent Labs  Lab 07/07/20 1540 07/07/20 1549 07/09/20 0149 07/10/20 0245  NA 139 139 136 136  K 3.9 4.9 4.0 3.5  CL 104 103 104 105  CO2 26  --  25 21*  GLUCOSE 118* 113* 135* 125*  BUN 30* 39* 19 18  CREATININE 1.01 0.90 1.02 0.98  CALCIUM 10.0  --  8.9 9.0   Liver Function Tests: Recent Labs  Lab 07/07/20 1540  AST 19  ALT 15  ALKPHOS 33*  BILITOT 0.8  PROT 7.1  ALBUMIN 4.5   CBC: Recent Labs  Lab 07/07/20 1540 07/07/20 1549 07/09/20 0149 07/10/20 0245  WBC 7.2  --  6.8 6.2  NEUTROABS 5.1  --   --   --   HGB 12.9* 12.2* 12.0* 12.1*  HCT 38.4* 36.0* 35.0* 35.6*  MCV 92.8  --  91.1 90.4  PLT 197  --  169 177    CBG: Recent Labs  Lab 07/09/20 2132 07/09/20 2244 07/10/20 0623 07/10/20 0855 07/10/20 1204  GLUCAP 141* 126* 125* 129* 195*     IMAGING STUDIES CT ANGIO HEAD W OR WO CONTRAST  Result Date: 07/08/2020 CLINICAL DATA:  Initial evaluation for acute stroke. EXAM: CT ANGIOGRAPHY HEAD AND NECK TECHNIQUE: Multidetector CT imaging of the head and neck was performed using the standard protocol during bolus administration of intravenous contrast. Multiplanar CT image reconstructions and MIPs were obtained to  evaluate the vascular anatomy. Carotid stenosis measurements (when applicable) are obtained utilizing NASCET criteria, using the distal internal carotid diameter as the denominator. CONTRAST:  87m OMNIPAQUE IOHEXOL 350 MG/ML SOLN COMPARISON:  Previous CT and MRI from earlier the same day. FINDINGS: CTA NECK FINDINGS Aortic arch: Examination mildly degraded by motion artifact. Visualized aortic arch normal caliber with normal 3 vessel morphology.  Mild-to-moderate atheromatous change about the arch and origin of the great vessels without hemodynamically significant stenosis. Right carotid system: Right CCA tortuous proximally but is widely patent to the bifurcation without stenosis. Mild atheromatous change about the right carotid bulb/proximal right ICA without significant stenosis. Right ICA patent distally without stenosis, dissection or occlusion. Left carotid system: Left CCA tortuous proximally but is widely patent to the bifurcation without stenosis. Bulky calcified plaque about the left carotid bulb with associated stenosis of up to approximately 50% by NASCET criteria. Left ICA patent distally without stenosis, dissection or occlusion. Vertebral arteries: Both vertebral arteries arise from subclavian arteries. No proximal subclavian artery stenosis. Atheromatous change about the origins of both vertebral arteries with associated moderate stenosis on the right and no more than mild narrowing on the left. Vertebral arteries mildly tortuous but are otherwise patent without stenosis, dissection or occlusion. Skeleton: No visible acute osseous finding. No discrete or worrisome osseous lesions. Moderate cervical spondylosis at C4-5 through C6-7 without high-grade stenosis. Other neck: No other acute soft tissue abnormality within the neck. 1.6 cm right thyroid nodule noted. In the setting of significant comorbidities or limited life expectancy, no follow-up recommended (ref: J Am Coll Radiol. 2015 Feb;12(2): 143-50). No other mass or adenopathy. Upper chest: Visualized upper chest demonstrates no acute finding. Centrilobular emphysematous changes. Review of the MIP images confirms the above findings CTA HEAD FINDINGS Anterior circulation: Petrous segments patent bilaterally. Atheromatous change throughout the carotid siphons with no more than mild narrowing. A1 segments patent bilaterally. Normal anterior communicating artery complex. Anterior cerebral  arteries patent to their distal aspects without stenosis. No M1 stenosis or occlusion. Normal MCA bifurcations. Distal MCA branches well perfused and symmetric. Posterior circulation: Mild nonstenotic atheromatous plaque within the V4 segments bilaterally without significant stenosis. Both vertebral arteries patent to the vertebrobasilar junction. Left vertebral artery slightly dominant. Right PICA patent. Left PICA not definitely seen. Basilar patent to its distal aspect without stenosis. Superior cerebellar arteries patent bilaterally. Both PCAs primarily supplied via the basilar. PCAs widely patent proximally. Distal right PCA somewhat attenuated, in keeping with the acute right PCA distribution infarct. Venous sinuses: Grossly patent allowing for timing the contrast bolus. Anatomic variants: None significant.  No aneurysm. Review of the MIP images confirms the above findings IMPRESSION: 1. Negative CTA for emergent large vessel occlusion. 2. Bulky calcified plaque about the left carotid bulb with associated stenosis of up to approximately 50% by NASCET criteria. 3. Moderate right and mild left vertebral artery origin stenoses. 4. Diffuse tortuosity of the major arterial vasculature of the head and neck, suggesting chronic underlying hypertension. 5. Aortic Atherosclerosis (ICD10-I70.0) and Emphysema (ICD10-J43.9). Electronically Signed   By: BJeannine BogaM.D.   On: 07/08/2020 00:13   CT HEAD WO CONTRAST  Result Date: 07/07/2020 CLINICAL DATA:  Right visual difficulties, initial encounter EXAM: CT HEAD WITHOUT CONTRAST TECHNIQUE: Contiguous axial images were obtained from the base of the skull through the vertex without intravenous contrast. COMPARISON:  01/07/2017 FINDINGS: Brain: Encephalomalacia is noted in the medial aspect of the right cerebellar hemisphere consistent with prior infarct. This is new from the  prior exam. Mild atrophic changes are seen. Chronic white matter ischemic changes are  noted as well. Previously seen para falcine subdural hematoma has resolved in the interval. No acute hemorrhage is noted. There is a geographic area of decreased attenuation identified in the right occipital lobe medially which was not visualized on the prior exam and suspicious for acute infarct. Vascular: No hyperdense vessel or unexpected calcification. Skull: Normal. Negative for fracture or focal lesion. Sinuses/Orbits: Orbits and their contents are within normal limits. Mild mucosal thickening is noted within the maxillary antra bilaterally. Other: None. IMPRESSION: Geographic area of decreased attenuation identified in the medial aspect of the right occipital lobe consistent with acute infarct. MRI may be helpful for further evaluation. Area of encephalomalacia in the posteromedial aspect of the right cerebellar hemisphere consistent with prior infarct. Chronic atrophic and ischemic changes. Critical Value/emergent results were called by telephone at the time of interpretation on 07/07/2020 at 3:53 pm to Dr. Carmin Muskrat , who verbally acknowledged these results. Electronically Signed   By: Inez Catalina M.D.   On: 07/07/2020 15:55   CT ANGIO NECK W OR WO CONTRAST  Result Date: 07/08/2020 CLINICAL DATA:  Initial evaluation for acute stroke. EXAM: CT ANGIOGRAPHY HEAD AND NECK TECHNIQUE: Multidetector CT imaging of the head and neck was performed using the standard protocol during bolus administration of intravenous contrast. Multiplanar CT image reconstructions and MIPs were obtained to evaluate the vascular anatomy. Carotid stenosis measurements (when applicable) are obtained utilizing NASCET criteria, using the distal internal carotid diameter as the denominator. CONTRAST:  84m OMNIPAQUE IOHEXOL 350 MG/ML SOLN COMPARISON:  Previous CT and MRI from earlier the same day. FINDINGS: CTA NECK FINDINGS Aortic arch: Examination mildly degraded by motion artifact. Visualized aortic arch normal caliber with  normal 3 vessel morphology. Mild-to-moderate atheromatous change about the arch and origin of the great vessels without hemodynamically significant stenosis. Right carotid system: Right CCA tortuous proximally but is widely patent to the bifurcation without stenosis. Mild atheromatous change about the right carotid bulb/proximal right ICA without significant stenosis. Right ICA patent distally without stenosis, dissection or occlusion. Left carotid system: Left CCA tortuous proximally but is widely patent to the bifurcation without stenosis. Bulky calcified plaque about the left carotid bulb with associated stenosis of up to approximately 50% by NASCET criteria. Left ICA patent distally without stenosis, dissection or occlusion. Vertebral arteries: Both vertebral arteries arise from subclavian arteries. No proximal subclavian artery stenosis. Atheromatous change about the origins of both vertebral arteries with associated moderate stenosis on the right and no more than mild narrowing on the left. Vertebral arteries mildly tortuous but are otherwise patent without stenosis, dissection or occlusion. Skeleton: No visible acute osseous finding. No discrete or worrisome osseous lesions. Moderate cervical spondylosis at C4-5 through C6-7 without high-grade stenosis. Other neck: No other acute soft tissue abnormality within the neck. 1.6 cm right thyroid nodule noted. In the setting of significant comorbidities or limited life expectancy, no follow-up recommended (ref: J Am Coll Radiol. 2015 Feb;12(2): 143-50). No other mass or adenopathy. Upper chest: Visualized upper chest demonstrates no acute finding. Centrilobular emphysematous changes. Review of the MIP images confirms the above findings CTA HEAD FINDINGS Anterior circulation: Petrous segments patent bilaterally. Atheromatous change throughout the carotid siphons with no more than mild narrowing. A1 segments patent bilaterally. Normal anterior communicating artery  complex. Anterior cerebral arteries patent to their distal aspects without stenosis. No M1 stenosis or occlusion. Normal MCA bifurcations. Distal MCA branches well perfused and symmetric.  Posterior circulation: Mild nonstenotic atheromatous plaque within the V4 segments bilaterally without significant stenosis. Both vertebral arteries patent to the vertebrobasilar junction. Left vertebral artery slightly dominant. Right PICA patent. Left PICA not definitely seen. Basilar patent to its distal aspect without stenosis. Superior cerebellar arteries patent bilaterally. Both PCAs primarily supplied via the basilar. PCAs widely patent proximally. Distal right PCA somewhat attenuated, in keeping with the acute right PCA distribution infarct. Venous sinuses: Grossly patent allowing for timing the contrast bolus. Anatomic variants: None significant.  No aneurysm. Review of the MIP images confirms the above findings IMPRESSION: 1. Negative CTA for emergent large vessel occlusion. 2. Bulky calcified plaque about the left carotid bulb with associated stenosis of up to approximately 50% by NASCET criteria. 3. Moderate right and mild left vertebral artery origin stenoses. 4. Diffuse tortuosity of the major arterial vasculature of the head and neck, suggesting chronic underlying hypertension. 5. Aortic Atherosclerosis (ICD10-I70.0) and Emphysema (ICD10-J43.9). Electronically Signed   By: Jeannine Boga M.D.   On: 07/08/2020 00:13   MR BRAIN WO CONTRAST  Result Date: 07/07/2020 CLINICAL DATA:  Abnormal CT with stroke EXAM: MRI HEAD WITHOUT CONTRAST TECHNIQUE: Multiplanar, multiecho pulse sequences of the brain and surrounding structures were obtained without intravenous contrast. COMPARISON:  Correlation made with CT earlier same day FINDINGS: Brain: There is restricted diffusion within the right occipital lobe and posteromedial right temporal lobe. Chronic infarcts of the right cerebellum with chronic blood products.  Prominence of the ventricles and sulci reflects generalized parenchymal volume loss. Patchy foci of T2 hyperintensity in the supratentorial white matter are nonspecific but probably reflect mild chronic microvascular ischemic changes. There is no intracranial mass or mass effect. No hydrocephalus or extra-axial collection. Scattered foci of susceptibility hypointensity in the cerebral white matter reflecting chronic microhemorrhages. Vascular: Major vessel flow voids at the skull base are preserved. Skull and upper cervical spine: Normal marrow signal is preserved. Sinuses/Orbits: Paranasal sinus mucosal thickening. Orbits are unremarkable. Other: Sella is unremarkable.  Mastoid air cells are clear. IMPRESSION: Acute right PCA territory infarction involving occipitotemporal lobes. Chronic right cerebellar infarcts with chronic blood products. Mild chronic microvascular ischemic changes. Scattered chronic microhemorrhages in a pattern more suggestive of amyloid angiopathy rather than hypertension. Electronically Signed   By: Macy Mis M.D.   On: 07/07/2020 18:10   IR US Guide Vasc Access Right  Result Date: 07/09/2020 INDICATION: ARMON ORVIS is a 86 y.o. male with a past medical history of hypertension, hyperlipidemia, pericardial effusion, AAA, pneumonia, Barrett's esophagus, GERD, nephrolithiasis, SLE, anemia, OSA, fibromyalgia, and arthritis. He presented to [facility] ED 07/07/2020 with complaints of left lateral visual field loss and dizziness. In ED, CT head revealed acute CVA (right PCA territory infarction involving the occipitotemporal lobes). Neurology was consulted and patient was admitted for further management. Further imaging studies revealed moderate right VA stenosis at the origin. EXAM: ULTRASOUND-GUIDED VASCULAR ACCESS DIAGNOSTIC CEREBRAL ANGIOGRAM COMPARISON:  CT/CT angiogram of the head and neck Jul 07, 2020 MEDICATIONS: 15 mg hydralazine ANESTHESIA/SEDATION: Versed 1 mg IV; Fentanyl 25  mcg IV Moderate Sedation Time:  55 The patient was continuously monitored during the procedure by the interventional radiology nurse under my direct supervision. CONTRAST:  240 mL of Omnipaque 240 milligram/mL FLUOROSCOPY TIME:  Fluoroscopy Time: 22 minutes 6 seconds (1,997 mGy). COMPLICATIONS: None immediate. TECHNIQUE: Informed written consent was obtained from the patient after a thorough discussion of the procedural risks, benefits and alternatives. All questions were addressed. Maximal Sterile Barrier Technique was utilized including caps,  mask, sterile gowns, sterile gloves, sterile drape, hand hygiene and skin antiseptic. A timeout was performed prior to the initiation of the procedure. The right groin was prepped and draped in the usual sterile fashion. Using a micropuncture kit and the modified Seldinger technique, access was gained to the right common femoral artery and a 5 French sheath was placed. Under fluoroscopy, a 5 [place] Berenstein 2 catheter was navigated over a 0.035" Terumo Glidewire into the aortic arch. The catheter was placed into the left subclavian artery. Frontal and lateral angiograms of the neck were obtained. Multiple attempts to catheterize the left vertebral artery proved unsuccessful due to tortuosity. The catheter was then retracted and placed into the right common carotid artery. Frontal and lateral angiograms of the neck were obtained. Under biplane roadmap, the catheter was subsequently placed into the left internal carotid artery. Frontal and lateral angiograms of the head were obtained. Multiple attempts to catheterize the right subclavian and right common carotid arteries with the Berenstein 2 catheter proved unsuccessful. Then, a 5 [place] Simmons 2 was navigated over the wire into the aortic arch. The catheter tip was reformed in the left subclavian artery. The catheter was then placed into the innominate artery. Frontal angiogram was obtained. The catheter was then advanced  into the proximal left common carotid artery. Frontal and lateral angiograms of the neck were obtained. Then, frontal, lateral and magnified right anterior oblique and lateral views of the head were obtained. The catheter was then retracted and the catheter tip was placed into the proximal right subclavian artery. Frontal and lateral angiograms of the neck were obtained followed by magnified oblique views of the neck, centered on the right vertebral artery origin. The catheter was again placed into the left subclavian artery and advanced into the left vertebral artery. Frontal, lateral and magnified oblique views of the head were obtained. The catheter was subsequently withdrawn. Right common femoral artery angiograms were obtained with right anterior oblique and lateral views. Then, a 5 [place] Exoseal was utilized for access closure. Hemostasis was achieved with a 5 minutes of manual pressure. FINDINGS: Left subclavian artery angiograms: Atherosclerotic plaque is seen in the left subclavian artery, just proximal to the origin of the left vertebral artery. No stenosis at the origin of the left vertebral artery which has increased tortuosity at the V1 and V2 segments. Left common carotid artery angiograms: Calcified atherosclerotic plaques in the left carotid bifurcation without hemodynamically significant stenosis. Mild ectasia of the distal cervical segment of the left ICA. Left internal carotid artery angiograms: There is brisk contrast opacification of the left ACA and MCA vascular trees. Luminal caliber is smooth and tapering. No hemodynamically significant stenosis. No aneurysm or anterior venous malformation. Visualized veins venous sinuses are patent. Innominate artery angiogram: Type 3 aortic arch with severe tortuosity of the innominate artery. Calcified plaque is noted at its bifurcation without hemodynamically significant stenosis. Right common carotid artery angiograms: Increased tortuosity of the  proximal right common carotid artery. Atherosclerotic changes are noted in the right carotid bifurcation without hemodynamically significant stenosis. Mild ectasias noted at the distal cervical segment of the right ICA. Right common carotid artery angiograms-cranial views: There is brisk contrast opacification of the right ACA and MCA vascular trees. Luminal caliber is smooth and tapering. No hemodynamically significant stenosis. No aneurysm or anterior venous malformation. Visualized veins venous sinuses are patent. Right subclavian artery angiograms: Calcified atherosclerotic plaques are seen at the origin of the right subclavian artery without hemodynamically significant stenosis. Approximately 50% stenosis  at the origin of the right vertebral artery. Remainder of the right vertebral artery is opacified up to the vertebrobasilar junction and no further focal stenosis seen. Left vertebral artery angiograms: There is brisk contrast opacification of the left vertebral artery, basilar artery bilateral posterior cerebral arteries. Opacification of the distal right vertebral artery seen by contrast reflux. Luminal caliber is smooth and tapering. No hemodynamically significant stenosis. No aneurysm or anterior venous malformation. Visualized veins venous sinuses are patent. Right femoral artery angiograms: Normal course and caliber of the right femoral artery and femoral bifurcation. The access at the level of the common femoral artery. PROCEDURE: N/A. IMPRESSION: 1. 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 2. Approximately 50% stenosis at the origin of the right vertebral artery. 3. No significant stenosis at the origin of the left vertebral artery. 4. Atherosclerotic changes of the bilateral carotid bifurcation without hemodynamically significant stenosis. Electronically Signed   By: Pedro Earls M.D.   On: 07/09/2020 17:10    ECHOCARDIOGRAM COMPLETE  Result Date: 07/09/2020    ECHOCARDIOGRAM REPORT   Patient Name:   BIJAN RIDGLEY Date of Exam: 07/09/2020 Medical Rec #:  [id number]          Height:       69.5 in Accession #:    [id number]         Weight:       214.0 lb Date of Birth:  [birth year]           BSA:          2.137 m Patient Age:    79 years           BP:           129/80 mmHg Patient Gender: M                  HR:           69 bpm. Exam Location:  Inpatient Procedure: 2D Echo Indications:    stroke  History:        Patient has prior history of Echocardiogram examinations, most                 recent 11/04/2014. Lupus; Risk Factors:Hypertension and Sleep                 Apnea.  Sonographer:    Johny Chess Referring Phys: [id number] [place]  1. Left ventricular ejection fraction, by estimation, is 55 to 60%. The left ventricle has normal function. The left ventricle has no regional wall motion abnormalities. There is mild left ventricular hypertrophy. Left ventricular diastolic parameters are consistent with Grade I diastolic dysfunction (impaired relaxation).  2. Right ventricular systolic function is normal. The right ventricular size is normal. There is normal pulmonary artery systolic pressure.  3. Left atrial size was moderately dilated.  4. Right atrial size was moderately dilated.  5. The mitral valve is normal in structure. Mild mitral valve regurgitation. No evidence of mitral stenosis.  6. The aortic valve is tricuspid. There is mild calcification of the aortic valve. Aortic valve regurgitation is not visualized. No aortic stenosis is present.  7. The inferior vena cava is dilated in size with >50% respiratory variability, suggesting right atrial pressure of 8 mmHg. FINDINGS  Left Ventricle: Left ventricular ejection fraction, by estimation, is 55 to 60%. The left ventricle has normal function. The left ventricle has no regional wall  motion abnormalities. The left ventricular internal cavity  size was normal in size. There is  mild left ventricular hypertrophy. Left ventricular diastolic parameters are consistent with Grade I diastolic dysfunction (impaired relaxation). Right Ventricle: The right ventricular size is normal. No increase in right ventricular wall thickness. Right ventricular systolic function is normal. There is normal pulmonary artery systolic pressure. The tricuspid regurgitant velocity is 2.65 m/s, and  with an assumed right atrial pressure of 5 mmHg, the estimated right ventricular systolic pressure is 97.4 mmHg. Left Atrium: Left atrial size was moderately dilated. Right Atrium: Right atrial size was moderately dilated. Pericardium: There is no evidence of pericardial effusion. Mitral Valve: The mitral valve is normal in structure. Mild mitral annular calcification. Mild mitral valve regurgitation. No evidence of mitral valve stenosis. Tricuspid Valve: The tricuspid valve is normal in structure. Tricuspid valve regurgitation is mild . No evidence of tricuspid stenosis. Aortic Valve: The aortic valve is tricuspid. There is mild calcification of the aortic valve. Aortic valve regurgitation is not visualized. No aortic stenosis is present. Pulmonic Valve: The pulmonic valve was not well visualized. Pulmonic valve regurgitation is trivial. No evidence of pulmonic stenosis. Aorta: The aortic root is normal in size and structure. Venous: The inferior vena cava is dilated in size with greater than 50% respiratory variability, suggesting right atrial pressure of 8 mmHg. IAS/Shunts: No atrial level shunt detected by color flow Doppler.  LEFT VENTRICLE PLAX 2D LVIDd:         5.20 cm      Diastology LVIDs:         3.80 cm      LV e' medial:    6.42 cm/s LV PW:         1.30 cm      LV E/e' medial:  9.4 LV IVS:        1.20 cm      LV e' lateral:   9.90 cm/s LVOT diam:     2.20 cm      LV E/e' lateral: 6.1 LV SV:         73 LV SV Index:   34 LVOT Area:     3.80 cm  LV Volumes (MOD) LV vol d, MOD  A2C: 109.0 ml LV vol d, MOD A4C: 133.0 ml LV vol s, MOD A2C: 58.5 ml LV vol s, MOD A4C: 79.1 ml LV SV MOD A2C:     50.5 ml LV SV MOD A4C:     133.0 ml LV SV MOD BP:      54.7 ml RIGHT VENTRICLE            IVC RV S prime:     9.57 cm/s  IVC diam: 2.50 cm TAPSE (M-mode): 1.1 cm LEFT ATRIUM             Index       RIGHT ATRIUM           Index LA diam:        4.10 cm 1.92 cm/m  RA Area:     17.70 cm LA Vol (A2C):   79.3 ml 37.11 ml/m RA Volume:   46.10 ml  21.57 ml/m LA Vol (A4C):   69.0 ml 32.29 ml/m LA Biplane Vol: 78.3 ml 36.64 ml/m  AORTIC VALVE LVOT Vmax:   85.80 cm/s LVOT Vmean:  53.800 cm/s LVOT VTI:    0.191 m  AORTA Ao Root diam: 3.40 cm Ao Asc diam:  3.50 cm MITRAL VALVE  TRICUSPID VALVE MV Area (PHT): 2.66 cm    TR Peak grad:   28.1 mmHg MV Decel Time: 285 msec    TR Vmax:        265.00 cm/s MV E velocity: 60.60 cm/s MV A velocity: 90.00 cm/s  SHUNTS MV E/A ratio:  0.67        Systemic VTI:  0.19 m                            Systemic Diam: 2.20 cm Glori Bickers MD Electronically signed by Glori Bickers MD Signature Date/Time: 07/09/2020/2:45:06 PM    Final    IR ANGIO INTRA EXTRACRAN SEL INTERNAL CAROTID BILAT MOD SED  Result Date: 07/09/2020 INDICATION: KAMIR SELOVER is a 86 y.o. male with a past medical history of hypertension, hyperlipidemia, pericardial effusion, AAA, pneumonia, Barrett's esophagus, GERD, nephrolithiasis, SLE, anemia, OSA, fibromyalgia, and arthritis. He presented to [facility] ED 07/07/2020 with complaints of left lateral visual field loss and dizziness. In ED, CT head revealed acute CVA (right PCA territory infarction involving the occipitotemporal lobes). Neurology was consulted and patient was admitted for further management. Further imaging studies revealed moderate right VA stenosis at the origin. EXAM: ULTRASOUND-GUIDED VASCULAR ACCESS DIAGNOSTIC CEREBRAL ANGIOGRAM COMPARISON:  CT/CT angiogram of the head and neck Jul 07, 2020 MEDICATIONS: 15 mg  hydralazine ANESTHESIA/SEDATION: Versed 1 mg IV; Fentanyl 25 mcg IV Moderate Sedation Time:  48 The patient was continuously monitored during the procedure by the interventional radiology nurse under my direct supervision. CONTRAST:  240 mL of Omnipaque 240 milligram/mL FLUOROSCOPY TIME:  Fluoroscopy Time: 22 minutes 6 seconds (1,997 mGy). COMPLICATIONS: None immediate. TECHNIQUE: Informed written consent was obtained from the patient after a thorough discussion of the procedural risks, benefits and alternatives. All questions were addressed. Maximal Sterile Barrier Technique was utilized including caps, mask, sterile gowns, sterile gloves, sterile drape, hand hygiene and skin antiseptic. A timeout was performed prior to the initiation of the procedure. The right groin was prepped and draped in the usual sterile fashion. Using a micropuncture kit and the modified Seldinger technique, access was gained to the right common femoral artery and a 5 French sheath was placed. Under fluoroscopy, a 5 [place] Berenstein 2 catheter was navigated over a 0.035" Terumo Glidewire into the aortic arch. The catheter was placed into the left subclavian artery. Frontal and lateral angiograms of the neck were obtained. Multiple attempts to catheterize the left vertebral artery proved unsuccessful due to tortuosity. The catheter was then retracted and placed into the right common carotid artery. Frontal and lateral angiograms of the neck were obtained. Under biplane roadmap, the catheter was subsequently placed into the left internal carotid artery. Frontal and lateral angiograms of the head were obtained. Multiple attempts to catheterize the right subclavian and right common carotid arteries with the Berenstein 2 catheter proved unsuccessful. Then, a 5 [place] Simmons 2 was navigated over the wire into the aortic arch. The catheter tip was reformed in the left subclavian artery. The catheter was then placed into the innominate artery.  Frontal angiogram was obtained. The catheter was then advanced into the proximal left common carotid artery. Frontal and lateral angiograms of the neck were obtained. Then, frontal, lateral and magnified right anterior oblique and lateral views of the head were obtained. The catheter was then retracted and the catheter tip was placed into the proximal right subclavian artery. Frontal and lateral angiograms of the neck were obtained followed by  magnified oblique views of the neck, centered on the right vertebral artery origin. The catheter was again placed into the left subclavian artery and advanced into the left vertebral artery. Frontal, lateral and magnified oblique views of the head were obtained. The catheter was subsequently withdrawn. Right common femoral artery angiograms were obtained with right anterior oblique and lateral views. Then, a 5 [place] Exoseal was utilized for access closure. Hemostasis was achieved with a 5 minutes of manual pressure. FINDINGS: Left subclavian artery angiograms: Atherosclerotic plaque is seen in the left subclavian artery, just proximal to the origin of the left vertebral artery. No stenosis at the origin of the left vertebral artery which has increased tortuosity at the V1 and V2 segments. Left common carotid artery angiograms: Calcified atherosclerotic plaques in the left carotid bifurcation without hemodynamically significant stenosis. Mild ectasia of the distal cervical segment of the left ICA. Left internal carotid artery angiograms: There is brisk contrast opacification of the left ACA and MCA vascular trees. Luminal caliber is smooth and tapering. No hemodynamically significant stenosis. No aneurysm or anterior venous malformation. Visualized veins venous sinuses are patent. Innominate artery angiogram: Type 3 aortic arch with severe tortuosity of the innominate artery. Calcified plaque is noted at its bifurcation without hemodynamically significant stenosis. Right  common carotid artery angiograms: Increased tortuosity of the proximal right common carotid artery. Atherosclerotic changes are noted in the right carotid bifurcation without hemodynamically significant stenosis. Mild ectasias noted at the distal cervical segment of the right ICA. Right common carotid artery angiograms-cranial views: There is brisk contrast opacification of the right ACA and MCA vascular trees. Luminal caliber is smooth and tapering. No hemodynamically significant stenosis. No aneurysm or anterior venous malformation. Visualized veins venous sinuses are patent. Right subclavian artery angiograms: Calcified atherosclerotic plaques are seen at the origin of the right subclavian artery without hemodynamically significant stenosis. Approximately 50% stenosis at the origin of the right vertebral artery. Remainder of the right vertebral artery is opacified up to the vertebrobasilar junction and no further focal stenosis seen. Left vertebral artery angiograms: There is brisk contrast opacification of the left vertebral artery, basilar artery bilateral posterior cerebral arteries. Opacification of the distal right vertebral artery seen by contrast reflux. Luminal caliber is smooth and tapering. No hemodynamically significant stenosis. No aneurysm or anterior venous malformation. Visualized veins venous sinuses are patent. Right femoral artery angiograms: Normal course and caliber of the right femoral artery and femoral bifurcation. The access at the level of the common femoral artery. PROCEDURE: N/A. IMPRESSION: 1. 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 2. Approximately 50% stenosis at the origin of the right vertebral artery. 3. No significant stenosis at the origin of the left vertebral artery. 4. Atherosclerotic changes of the bilateral carotid bifurcation without hemodynamically significant stenosis. Electronically Signed    By: Pedro Earls M.D.   On: 07/09/2020 17:10   IR ANGIO VERTEBRAL SEL SUBCLAVIAN INNOMINATE UNI R MOD SED  Result Date: 07/09/2020 INDICATION: EDY BELT is a 86 y.o. male with a past medical history of hypertension, hyperlipidemia, pericardial effusion, AAA, pneumonia, Barrett's esophagus, GERD, nephrolithiasis, SLE, anemia, OSA, fibromyalgia, and arthritis. He presented to [facility] ED 07/07/2020 with complaints of left lateral visual field loss and dizziness. In ED, CT head revealed acute CVA (right PCA territory infarction involving the occipitotemporal lobes). Neurology was consulted and patient was admitted for further management. Further imaging studies revealed moderate right VA stenosis at the  origin. EXAM: ULTRASOUND-GUIDED VASCULAR ACCESS DIAGNOSTIC CEREBRAL ANGIOGRAM COMPARISON:  CT/CT angiogram of the head and neck Jul 07, 2020 MEDICATIONS: 15 mg hydralazine ANESTHESIA/SEDATION: Versed 1 mg IV; Fentanyl 25 mcg IV Moderate Sedation Time:  30 The patient was continuously monitored during the procedure by the interventional radiology nurse under my direct supervision. CONTRAST:  240 mL of Omnipaque 240 milligram/mL FLUOROSCOPY TIME:  Fluoroscopy Time: 22 minutes 6 seconds (1,997 mGy). COMPLICATIONS: None immediate. TECHNIQUE: Informed written consent was obtained from the patient after a thorough discussion of the procedural risks, benefits and alternatives. All questions were addressed. Maximal Sterile Barrier Technique was utilized including caps, mask, sterile gowns, sterile gloves, sterile drape, hand hygiene and skin antiseptic. A timeout was performed prior to the initiation of the procedure. The right groin was prepped and draped in the usual sterile fashion. Using a micropuncture kit and the modified Seldinger technique, access was gained to the right common femoral artery and a 5 French sheath was placed. Under fluoroscopy, a 5 [place] Berenstein 2 catheter was navigated over  a 0.035" Terumo Glidewire into the aortic arch. The catheter was placed into the left subclavian artery. Frontal and lateral angiograms of the neck were obtained. Multiple attempts to catheterize the left vertebral artery proved unsuccessful due to tortuosity. The catheter was then retracted and placed into the right common carotid artery. Frontal and lateral angiograms of the neck were obtained. Under biplane roadmap, the catheter was subsequently placed into the left internal carotid artery. Frontal and lateral angiograms of the head were obtained. Multiple attempts to catheterize the right subclavian and right common carotid arteries with the Berenstein 2 catheter proved unsuccessful. Then, a 5 [place] Simmons 2 was navigated over the wire into the aortic arch. The catheter tip was reformed in the left subclavian artery. The catheter was then placed into the innominate artery. Frontal angiogram was obtained. The catheter was then advanced into the proximal left common carotid artery. Frontal and lateral angiograms of the neck were obtained. Then, frontal, lateral and magnified right anterior oblique and lateral views of the head were obtained. The catheter was then retracted and the catheter tip was placed into the proximal right subclavian artery. Frontal and lateral angiograms of the neck were obtained followed by magnified oblique views of the neck, centered on the right vertebral artery origin. The catheter was again placed into the left subclavian artery and advanced into the left vertebral artery. Frontal, lateral and magnified oblique views of the head were obtained. The catheter was subsequently withdrawn. Right common femoral artery angiograms were obtained with right anterior oblique and lateral views. Then, a 5 [place] Exoseal was utilized for access closure. Hemostasis was achieved with a 5 minutes of manual pressure. FINDINGS: Left subclavian artery angiograms: Atherosclerotic plaque is seen in the  left subclavian artery, just proximal to the origin of the left vertebral artery. No stenosis at the origin of the left vertebral artery which has increased tortuosity at the V1 and V2 segments. Left common carotid artery angiograms: Calcified atherosclerotic plaques in the left carotid bifurcation without hemodynamically significant stenosis. Mild ectasia of the distal cervical segment of the left ICA. Left internal carotid artery angiograms: There is brisk contrast opacification of the left ACA and MCA vascular trees. Luminal caliber is smooth and tapering. No hemodynamically significant stenosis. No aneurysm or anterior venous malformation. Visualized veins venous sinuses are patent. Innominate artery angiogram: Type 3 aortic arch with severe tortuosity of the innominate artery. Calcified plaque is noted at its  bifurcation without hemodynamically significant stenosis. Right common carotid artery angiograms: Increased tortuosity of the proximal right common carotid artery. Atherosclerotic changes are noted in the right carotid bifurcation without hemodynamically significant stenosis. Mild ectasias noted at the distal cervical segment of the right ICA. Right common carotid artery angiograms-cranial views: There is brisk contrast opacification of the right ACA and MCA vascular trees. Luminal caliber is smooth and tapering. No hemodynamically significant stenosis. No aneurysm or anterior venous malformation. Visualized veins venous sinuses are patent. Right subclavian artery angiograms: Calcified atherosclerotic plaques are seen at the origin of the right subclavian artery without hemodynamically significant stenosis. Approximately 50% stenosis at the origin of the right vertebral artery. Remainder of the right vertebral artery is opacified up to the vertebrobasilar junction and no further focal stenosis seen. Left vertebral artery angiograms: There is brisk contrast opacification of the left vertebral artery,  basilar artery bilateral posterior cerebral arteries. Opacification of the distal right vertebral artery seen by contrast reflux. Luminal caliber is smooth and tapering. No hemodynamically significant stenosis. No aneurysm or anterior venous malformation. Visualized veins venous sinuses are patent. Right femoral artery angiograms: Normal course and caliber of the right femoral artery and femoral bifurcation. The access at the level of the common femoral artery. PROCEDURE: N/A. IMPRESSION: 1. 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 2. Approximately 50% stenosis at the origin of the right vertebral artery. 3. No significant stenosis at the origin of the left vertebral artery. 4. Atherosclerotic changes of the bilateral carotid bifurcation without hemodynamically significant stenosis. Electronically Signed   By: Pedro Earls M.D.   On: 07/09/2020 17:10   IR ANGIO VERTEBRAL SEL VERTEBRAL UNI L MOD SED  Result Date: 07/09/2020 INDICATION: JAIDON ELLERY is a 86 y.o. male with a past medical history of hypertension, hyperlipidemia, pericardial effusion, AAA, pneumonia, Barrett's esophagus, GERD, nephrolithiasis, SLE, anemia, OSA, fibromyalgia, and arthritis. He presented to [facility] ED 07/07/2020 with complaints of left lateral visual field loss and dizziness. In ED, CT head revealed acute CVA (right PCA territory infarction involving the occipitotemporal lobes). Neurology was consulted and patient was admitted for further management. Further imaging studies revealed moderate right VA stenosis at the origin. EXAM: ULTRASOUND-GUIDED VASCULAR ACCESS DIAGNOSTIC CEREBRAL ANGIOGRAM COMPARISON:  CT/CT angiogram of the head and neck Jul 07, 2020 MEDICATIONS: 15 mg hydralazine ANESTHESIA/SEDATION: Versed 1 mg IV; Fentanyl 25 mcg IV Moderate Sedation Time:  59 The patient was continuously monitored during the procedure by the  interventional radiology nurse under my direct supervision. CONTRAST:  240 mL of Omnipaque 240 milligram/mL FLUOROSCOPY TIME:  Fluoroscopy Time: 22 minutes 6 seconds (1,997 mGy). COMPLICATIONS: None immediate. TECHNIQUE: Informed written consent was obtained from the patient after a thorough discussion of the procedural risks, benefits and alternatives. All questions were addressed. Maximal Sterile Barrier Technique was utilized including caps, mask, sterile gowns, sterile gloves, sterile drape, hand hygiene and skin antiseptic. A timeout was performed prior to the initiation of the procedure. The right groin was prepped and draped in the usual sterile fashion. Using a micropuncture kit and the modified Seldinger technique, access was gained to the right common femoral artery and a 5 French sheath was placed. Under fluoroscopy, a 5 [place] Berenstein 2 catheter was navigated over a 0.035" Terumo Glidewire into the aortic arch. The catheter was placed into the left subclavian artery. Frontal and lateral angiograms of the neck were obtained. Multiple attempts to catheterize the left vertebral  artery proved unsuccessful due to tortuosity. The catheter was then retracted and placed into the right common carotid artery. Frontal and lateral angiograms of the neck were obtained. Under biplane roadmap, the catheter was subsequently placed into the left internal carotid artery. Frontal and lateral angiograms of the head were obtained. Multiple attempts to catheterize the right subclavian and right common carotid arteries with the Berenstein 2 catheter proved unsuccessful. Then, a 5 [place] Simmons 2 was navigated over the wire into the aortic arch. The catheter tip was reformed in the left subclavian artery. The catheter was then placed into the innominate artery. Frontal angiogram was obtained. The catheter was then advanced into the proximal left common carotid artery. Frontal and lateral angiograms of the neck were  obtained. Then, frontal, lateral and magnified right anterior oblique and lateral views of the head were obtained. The catheter was then retracted and the catheter tip was placed into the proximal right subclavian artery. Frontal and lateral angiograms of the neck were obtained followed by magnified oblique views of the neck, centered on the right vertebral artery origin. The catheter was again placed into the left subclavian artery and advanced into the left vertebral artery. Frontal, lateral and magnified oblique views of the head were obtained. The catheter was subsequently withdrawn. Right common femoral artery angiograms were obtained with right anterior oblique and lateral views. Then, a 5 [place] Exoseal was utilized for access closure. Hemostasis was achieved with a 5 minutes of manual pressure. FINDINGS: Left subclavian artery angiograms: Atherosclerotic plaque is seen in the left subclavian artery, just proximal to the origin of the left vertebral artery. No stenosis at the origin of the left vertebral artery which has increased tortuosity at the V1 and V2 segments. Left common carotid artery angiograms: Calcified atherosclerotic plaques in the left carotid bifurcation without hemodynamically significant stenosis. Mild ectasia of the distal cervical segment of the left ICA. Left internal carotid artery angiograms: There is brisk contrast opacification of the left ACA and MCA vascular trees. Luminal caliber is smooth and tapering. No hemodynamically significant stenosis. No aneurysm or anterior venous malformation. Visualized veins venous sinuses are patent. Innominate artery angiogram: Type 3 aortic arch with severe tortuosity of the innominate artery. Calcified plaque is noted at its bifurcation without hemodynamically significant stenosis. Right common carotid artery angiograms: Increased tortuosity of the proximal right common carotid artery. Atherosclerotic changes are noted in the right carotid  bifurcation without hemodynamically significant stenosis. Mild ectasias noted at the distal cervical segment of the right ICA. Right common carotid artery angiograms-cranial views: There is brisk contrast opacification of the right ACA and MCA vascular trees. Luminal caliber is smooth and tapering. No hemodynamically significant stenosis. No aneurysm or anterior venous malformation. Visualized veins venous sinuses are patent. Right subclavian artery angiograms: Calcified atherosclerotic plaques are seen at the origin of the right subclavian artery without hemodynamically significant stenosis. Approximately 50% stenosis at the origin of the right vertebral artery. Remainder of the right vertebral artery is opacified up to the vertebrobasilar junction and no further focal stenosis seen. Left vertebral artery angiograms: There is brisk contrast opacification of the left vertebral artery, basilar artery bilateral posterior cerebral arteries. Opacification of the distal right vertebral artery seen by contrast reflux. Luminal caliber is smooth and tapering. No hemodynamically significant stenosis. No aneurysm or anterior venous malformation. Visualized veins venous sinuses are patent. Right femoral artery angiograms: Normal course and caliber of the right femoral artery and femoral bifurcation. The access at the level of the common  femoral artery. PROCEDURE: N/A. IMPRESSION: 1. 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 2. Approximately 50% stenosis at the origin of the right vertebral artery. 3. No significant stenosis at the origin of the left vertebral artery. 4. Atherosclerotic changes of the bilateral carotid bifurcation without hemodynamically significant stenosis. Electronically Signed   By: Pedro Earls M.D.   On: 07/09/2020 17:10    DISCHARGE EXAMINATION: Vitals:   07/10/20 0006 07/10/20 0353 07/10/20 0744 07/10/20  1137  BP: (!) 148/85 (!) 143/80 (!) 162/77 139/65  Pulse: 79 83 79 83  Resp: _0 Temp: 97.8 F (36.6 C) 98 F (36.7 C) 98.2 F (36.8 C) 97.7 F (36.5 C)  TempSrc: Oral  Oral Oral  SpO2: 97% 98% 99% 96%   General appearance: Awake alert.  In no distress Resp: Clear to auscultation bilaterally.  Normal effort Cardio: S1-S2 is normal regular.  No S3-S4.  No rubs murmurs or bruit GI: Abdomen is soft.  Nontender nondistended.  Bowel sounds are present normal.  No masses organomegaly    DISPOSITION: Home  Discharge Instructions    Call MD for:  difficulty breathing, headache or visual disturbances   Complete by: As directed    Call MD for:  extreme fatigue   Complete by: As directed    Call MD for:  persistant dizziness or light-headedness   Complete by: As directed    Call MD for:  persistant nausea and vomiting   Complete by: As directed    Call MD for:  severe uncontrolled pain   Complete by: As directed    Call MD for:  temperature >100.4   Complete by: As directed    Diet - low sodium heart healthy   Complete by: As directed    Discharge instructions   Complete by: As directed    Home health services will be arranged by the case manager.  Please follow-up with your primary care provider within 1 week.  Referral has been sent to neurology to arrange outpatient follow-up.   Neurology has also recommended a heart monitor which you will have to wear for 30 days to detect any arrhythmias.  This will be arranged by the cardiology clinic.  They will be in touch with you.   Take your medications as prescribed.  You were cared for by a hospitalist during your hospital stay. If you have any questions about your discharge medications or the care you received while you were in the hospital after you are discharged, you can call the unit and asked to speak with the hospitalist on call if the hospitalist that took care of you is not available. Once you are discharged, your primary  care physician will handle any further medical issues. Please note that NO REFILLS for any discharge medications will be authorized once you are discharged, as it is imperative that you return to your primary care physician (or establish a relationship with a primary care physician if you do not have one) for your aftercare needs so that they can reassess your need for medications and monitor your lab values. If you do not have a primary care physician, you can call [phone] for a physician referral.   Increase activity slowly   Complete by: As directed    No wound care   Complete by: As directed        Allergies as of 07/10/2020      Reactions  Atorvastatin Other (See Comments)   Myalgias   Lipitor [atorvastatin Calcium]    Myalgias      Medication List    TAKE these medications   acetaminophen 500 MG tablet Commonly known as: TYLENOL Take 500 mg by mouth every 8 (eight) hours as needed for mild pain.   acyclovir 400 MG tablet Commonly known as: ZOVIRAX Take 400 mg by mouth 2 (two) times daily.   amLODipine 5 MG tablet Commonly known as: NORVASC Take 1 tablet (5 mg total) daily by mouth.   aspirin 81 MG tablet Take 81 mg by mouth daily.   BIOFLEX PO Take 1 tablet by mouth 2 (two) times daily.   calcium-vitamin D 500-200 MG-UNIT tablet Commonly known as: OSCAL WITH D Take 1 tablet by mouth daily with breakfast.   clopidogrel 75 MG tablet Commonly known as: PLAVIX Take 1 tablet (75 mg total) by mouth daily for 21 days.   docusate sodium 100 MG capsule Commonly known as: COLACE Take 100 mg by mouth 2 (two) times daily as needed for mild constipation.   fenofibrate 145 MG tablet Commonly known as: TRICOR Take 145 mg by mouth daily.   ferrous sulfate 325 (65 FE) MG tablet Take 325 mg by mouth 2 (two) times daily with a meal.   Fish Oil 1200 MG Caps Take 1,200 mg by mouth daily.   folic acid 706 MCG tablet Commonly known as: FOLVITE Take 800 mcg by mouth  daily.   hydroxychloroquine 200 MG tablet Commonly known as: Plaquenil Take one tablet by mouth twice daily Monday-Friday only.   magnesium oxide 400 (241.3 Mg) MG tablet Commonly known as: MAG-OX Take 1 tablet (400 mg total) by mouth 3 (three) times daily.   metFORMIN 500 MG tablet Commonly known as: GLUCOPHAGE Take 500 mg by mouth daily.   metoprolol tartrate 25 MG tablet Commonly known as: LOPRESSOR Take 0.5 tablets (12.5 mg total) 2 (two) times daily by mouth. What changed: how much to take   multivitamin with minerals Tabs tablet Take 1 tablet by mouth daily.   pantoprazole 40 MG tablet Commonly known as: PROTONIX Take 40 mg by mouth 2 (two) times daily.   rosuvastatin 5 MG tablet Commonly known as: CRESTOR Take 5 mg by mouth every other day.   tadalafil 20 MG tablet Commonly known as: CIALIS Take 20 mg by mouth daily as needed for erectile dysfunction.   tamsulosin 0.4 MG Caps capsule Commonly known as: FLOMAX Take 0.4 mg by mouth daily.   telmisartan-hydrochlorothiazide 80-12.5 MG tablet Commonly known as: MICARDIS HCT Take 1 tablet by mouth daily.   vitamin C 500 MG tablet Commonly known as: ASCORBIC ACID Take 500 mg by mouth daily.   vitamin E 180 MG (400 UNITS) capsule Take 400 Units by mouth daily.         Follow-up Information    [name], MD. Schedule an appointment as soon as possible for a visit in 1 week(s).   Specialty: Family Medicine Contact information: [address] [place] [address] [phone]        [name], MD Follow up on 09/01/2020.   Specialty: Cardiology Why: Please arrive 15 minutes early for your 9:40am post-hospital follow-up appointment Contact information: [place] [address] [phone]        Care, [facility] Follow up.   Specialty: Home Health Services Why: For home health services. They will call you in 1-2 days to  set up your first  home visit.  Contact information: [place] [address] [phone]               TOTAL DISCHARGE TIME: 35 minutes  [facility] Pager on [email or web address]  07/11/2020, 9:57 AM

## 2020-07-10 NOTE — TOC Transition Note (Addendum)
Transition of Care [facility]) - CM/SW Discharge Note   Patient Details  Name: Tyler Norris MRN: [id number] Date of Birth: February 20, 1935  Transition of Care [facility]) CM/SW Contact:  Carles Collet, RN Phone Number: 07/10/2020, 9:22 AM   Clinical Narrative:    Notified by MD that patient would like to use [facility] for [facility] services. Referral accepted. Could not reach patient on room number, or cell number. LVM on daughter's phone stating referral has been made to [facility] and she can expect call in 1-2 days to set up time for first home appointment.  No other CM needs identified    Final next level of care: Home w Home Health Services Barriers to Discharge: No Barriers Identified   Patient Goals and CMS Choice Patient states their goals for this hospitalization and ongoing recovery are:: to get back home CMS Medicare.gov Compare Post Acute Care list provided to:: Patient Choice offered to / list presented to : Adult Children,Patient  Discharge Placement                       Discharge Plan and Services     Post Acute Care Choice: Home Health                    HH Arranged: PT,OT [place] Agency: [place] [facility] Contacted: 07/10/20 Time [place]: [phone] Representative spoke with at [place]: [place] Determinants of Health ([place]) Interventions     Readmission Risk Interventions No flowsheet data found.

## 2020-07-12 ENCOUNTER — Encounter: Payer: Self-pay | Admitting: *Deleted

## 2020-07-12 NOTE — Progress Notes (Signed)
Patient ID: Tyler Norris, male   DOB: [birth year], 86 y.o.   MRN: [id number] Patient enrolled for [employer] to ship a 30 day cardiac event monitor to his home. Letter with instructions mailed to patient.

## 2020-07-15 DIAGNOSIS — I69318 Other symptoms and signs involving cognitive functions following cerebral infarction: Secondary | ICD-10-CM | POA: Diagnosis not present

## 2020-07-15 DIAGNOSIS — I088 Other rheumatic multiple valve diseases: Secondary | ICD-10-CM | POA: Diagnosis not present

## 2020-07-15 DIAGNOSIS — Z7902 Long term (current) use of antithrombotics/antiplatelets: Secondary | ICD-10-CM | POA: Diagnosis not present

## 2020-07-15 DIAGNOSIS — I714 Abdominal aortic aneurysm, without rupture: Secondary | ICD-10-CM | POA: Diagnosis not present

## 2020-07-15 DIAGNOSIS — D649 Anemia, unspecified: Secondary | ICD-10-CM | POA: Diagnosis not present

## 2020-07-15 DIAGNOSIS — M329 Systemic lupus erythematosus, unspecified: Secondary | ICD-10-CM | POA: Diagnosis not present

## 2020-07-15 DIAGNOSIS — I700 Atherosclerosis of aorta: Secondary | ICD-10-CM | POA: Diagnosis not present

## 2020-07-15 DIAGNOSIS — I6522 Occlusion and stenosis of left carotid artery: Secondary | ICD-10-CM | POA: Diagnosis not present

## 2020-07-15 DIAGNOSIS — I119 Hypertensive heart disease without heart failure: Secondary | ICD-10-CM | POA: Diagnosis not present

## 2020-07-15 DIAGNOSIS — E785 Hyperlipidemia, unspecified: Secondary | ICD-10-CM | POA: Diagnosis not present

## 2020-07-15 DIAGNOSIS — I69312 Visuospatial deficit and spatial neglect following cerebral infarction: Secondary | ICD-10-CM | POA: Diagnosis not present

## 2020-07-15 DIAGNOSIS — R109 Unspecified abdominal pain: Secondary | ICD-10-CM | POA: Diagnosis not present

## 2020-07-15 DIAGNOSIS — I6501 Occlusion and stenosis of right vertebral artery: Secondary | ICD-10-CM | POA: Diagnosis not present

## 2020-07-15 DIAGNOSIS — G4730 Sleep apnea, unspecified: Secondary | ICD-10-CM | POA: Diagnosis not present

## 2020-07-15 DIAGNOSIS — E119 Type 2 diabetes mellitus without complications: Secondary | ICD-10-CM | POA: Diagnosis not present

## 2020-07-15 DIAGNOSIS — Z9181 History of falling: Secondary | ICD-10-CM | POA: Diagnosis not present

## 2020-07-15 DIAGNOSIS — J432 Centrilobular emphysema: Secondary | ICD-10-CM | POA: Diagnosis not present

## 2020-07-15 DIAGNOSIS — I69398 Other sequelae of cerebral infarction: Secondary | ICD-10-CM | POA: Diagnosis not present

## 2020-07-15 DIAGNOSIS — Z9981 Dependence on supplemental oxygen: Secondary | ICD-10-CM | POA: Diagnosis not present

## 2020-07-15 DIAGNOSIS — Z7984 Long term (current) use of oral hypoglycemic drugs: Secondary | ICD-10-CM | POA: Diagnosis not present

## 2020-07-15 DIAGNOSIS — M159 Polyosteoarthritis, unspecified: Secondary | ICD-10-CM | POA: Diagnosis not present

## 2020-07-15 DIAGNOSIS — E669 Obesity, unspecified: Secondary | ICD-10-CM | POA: Diagnosis not present

## 2020-07-15 DIAGNOSIS — Z7982 Long term (current) use of aspirin: Secondary | ICD-10-CM | POA: Diagnosis not present

## 2020-07-15 DIAGNOSIS — R2681 Unsteadiness on feet: Secondary | ICD-10-CM | POA: Diagnosis not present

## 2020-07-15 DIAGNOSIS — Z6829 Body mass index (BMI) 29.0-29.9, adult: Secondary | ICD-10-CM | POA: Diagnosis not present

## 2020-07-16 DIAGNOSIS — R2681 Unsteadiness on feet: Secondary | ICD-10-CM | POA: Diagnosis not present

## 2020-07-16 DIAGNOSIS — R1010 Upper abdominal pain, unspecified: Secondary | ICD-10-CM | POA: Diagnosis not present

## 2020-07-16 DIAGNOSIS — I6522 Occlusion and stenosis of left carotid artery: Secondary | ICD-10-CM | POA: Diagnosis not present

## 2020-07-16 DIAGNOSIS — I69318 Other symptoms and signs involving cognitive functions following cerebral infarction: Secondary | ICD-10-CM | POA: Diagnosis not present

## 2020-07-16 DIAGNOSIS — I69398 Other sequelae of cerebral infarction: Secondary | ICD-10-CM | POA: Diagnosis not present

## 2020-07-16 DIAGNOSIS — I69312 Visuospatial deficit and spatial neglect following cerebral infarction: Secondary | ICD-10-CM | POA: Diagnosis not present

## 2020-07-16 DIAGNOSIS — I6501 Occlusion and stenosis of right vertebral artery: Secondary | ICD-10-CM | POA: Diagnosis not present

## 2020-07-16 DIAGNOSIS — I639 Cerebral infarction, unspecified: Secondary | ICD-10-CM | POA: Diagnosis not present

## 2020-07-18 ENCOUNTER — Ambulatory Visit (INDEPENDENT_AMBULATORY_CARE_PROVIDER_SITE_OTHER): Payer: Medicare Other

## 2020-07-18 DIAGNOSIS — I480 Paroxysmal atrial fibrillation: Secondary | ICD-10-CM

## 2020-07-18 DIAGNOSIS — I639 Cerebral infarction, unspecified: Secondary | ICD-10-CM

## 2020-07-20 NOTE — Progress Notes (Signed)
Office Visit Note  Patient: Tyler Norris             Date of Birth: [birth year]           MRN: [id number]             PCP: [name], MD Referring: [name], MD Visit Date: 08/03/2020 Occupation: _0 @  Subjective:  Medication management.   History of Present Illness: Tyler Norris is a 86 y.o. male with history of systemic lupus erythematosus.  He states about 10 days ago he had a mild stroke.  He has left sided visual loss from the stroke.  He denies any balance issues or extremity weakness.  He noticed some effect on the short-term memory which is improving gradually.  Activities of Daily Living:  Patient reports morning stiffness for 0  minutes.   Patient Denies nocturnal pain.  Difficulty dressing/grooming: Denies Difficulty climbing stairs: Reports Difficulty getting out of chair: Denies Difficulty using hands for taps, buttons, cutlery, and/or writing: Denies  Review of Systems  Constitutional: Positive for fatigue. Negative for night sweats.  HENT: Positive for mouth dryness. Negative for mouth sores and nose dryness.   Eyes: Positive for visual disturbance. Negative for pain, redness, itching and dryness.  Respiratory: Negative for shortness of breath and difficulty breathing.   Cardiovascular: Negative for chest pain, palpitations, hypertension, irregular heartbeat and swelling in legs/feet.  Gastrointestinal: Negative for blood in stool, constipation and diarrhea.  Endocrine: Negative for increased urination.  Genitourinary: Negative for difficulty urinating.  Musculoskeletal: Positive for arthralgias and joint pain. Negative for joint swelling, myalgias, muscle weakness, morning stiffness, muscle tenderness and myalgias.  Skin: Negative for color change, rash, hair loss, nodules/bumps, redness, skin tightness, ulcers and sensitivity to sunlight.  Allergic/Immunologic: Negative for susceptible to infections.  Neurological: Positive for memory  loss. Negative for dizziness, fainting, numbness, headaches, night sweats and weakness.  Hematological: Negative for bruising/bleeding tendency and swollen glands.  Psychiatric/Behavioral: Negative for depressed mood, confusion and sleep disturbance. The patient is not nervous/anxious.     PMFS History:  Patient Active Problem List   Diagnosis Date Noted  . CVA (cerebral vascular accident) ([place]) 07/07/2020  . Pain in joint of right ankle 03/27/2017  . SDH (subdural hematoma) ([place]) 01/08/2017  . Dysphonia 11/15/2015  . Pleural effusion on left 06/02/2015  . Aneurysm of thoracic aorta ([place]) 06/02/2015  . SOB (shortness of breath) 08/22/2014  . Pericardial effusion 08/22/2014  . Lupus (systemic lupus erythematosus) ([place])   . 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 PE (pulmonary embolism) 06/05/2012  . Pleural effusion exudative 05/26/2012  . Essential hypertension 05/13/2012  . OSA on CPAP 05/13/2012  . Cardiomegaly 05/13/2012  . Abdominal aneurysm without mention of rupture 06/27/2011    Past Medical History:  Diagnosis Date  . AAA (abdominal aortic aneurysm) ([place])    a. resolved by ct scan 09-2011  . Anemia   . Arthritis   . Barrett's esophagus   . Carpal tunnel syndrome, bilateral   . Essential hypertension   . Fibromyalgia   . GERD (gastroesophageal reflux disease)   . History of pneumonia   . Hyperlipidemia   . Lupus (systemic lupus erythematosus) ([place])   . Nephrolithiasis   . Pericardial effusion    a. 08/22/2014 Echo: EF 60-65%, mildly dil Ao root (75m), mildly dil LA, mod-large partially organized pericardial effusion with possible early tamponade - asymptomatic on high dose nsaids-->conservative mgmt;  b. 08/2014 Limited Echo: EF 55-60%, Triv - small  effusion.  . Sleep apnea    a. uses cpap every night    Family History  Problem Relation Age of Onset  . Diabetes Mother   . Diabetes Sister   . Stroke Sister   . Healthy Daughter   . Healthy Son    Past  Surgical History:  Procedure Laterality Date  . APPENDECTOMY    . CARPAL TUNNEL RELEASE  10/12/2011   Procedure: CARPAL TUNNEL RELEASE;  Surgeon: Cammie Sickle., MD;  Location: [facility];  Service: Orthopedics;  Laterality: Left;  . CARPAL TUNNEL RELEASE  12/01/2011   Procedure: CARPAL TUNNEL RELEASE;  Surgeon: Cammie Sickle., MD;  Location: [facility];  Service: Orthopedics;  Laterality: Right;  . CYSTOSCOPY W/ STONE MANIPULATION  [id number]  . HAND SURGERY     scar repaired lt hand  . HERNIA REPAIR  1966  . IR ANGIO INTRA EXTRACRAN SEL INTERNAL CAROTID BILAT MOD SED  07/09/2020  . IR ANGIO VERTEBRAL SEL SUBCLAVIAN INNOMINATE UNI R MOD SED  07/09/2020  . IR ANGIO VERTEBRAL SEL VERTEBRAL UNI L MOD SED  07/09/2020  . IR US GUIDE VASC ACCESS RIGHT  07/09/2020  . KNEE ARTHROPLASTY    . KNEE ARTHROSCOPY Left   . TONSILLECTOMY     Social History   Social History Narrative  . Not on file   Immunization History  Administered Date(s) Administered  . Fluad Quad(high Dose 65+) 01/16/2019, 10/28/2019  . Influenza Nasal 11/28/2011  . Influenza Split 11/27/2013, 11/28/2014  . Influenza,inj,Quad PF,6+ Mos 11/11/2012, 10/29/2015  . Influenza-Unspecified 11/16/2014  . Moderna Sars-Covid-2 Vaccination 03/12/2019, 04/08/2019, 10/27/2019, 06/21/2020  . Pneumococcal Polysaccharide-23 05/15/2012  . Tdap 01/07/2017  . Zoster Recombinat (Shingrix) 01/16/2018, 03/17/2018     Objective: Vital Signs: BP (!) 159/74 (BP Location: Left Arm, Patient Position: Sitting, Cuff Size: Normal)   Pulse 61   Resp 16   Ht _0  (1.753 m)   Wt 204 lb (92.5 kg)   BMI 30.13 kg/m    Physical Exam Vitals and nursing note reviewed.  Constitutional:      Appearance: He is well-developed.  HENT:     Head: Normocephalic and atraumatic.  Eyes:     Conjunctiva/sclera: Conjunctivae normal.     Pupils: Pupils are equal, round, and reactive to light.  Cardiovascular:     Rate and  Rhythm: Normal rate and regular rhythm.     Heart sounds: Normal heart sounds.  Pulmonary:     Effort: Pulmonary effort is normal.     Breath sounds: Normal breath sounds.  Abdominal:     General: Bowel sounds are normal.     Palpations: Abdomen is soft.  Musculoskeletal:     Cervical back: Normal range of motion and neck supple.  Skin:    General: Skin is warm and dry.     Capillary Refill: Capillary refill takes less than 2 seconds.  Neurological:     Mental Status: He is alert and oriented to person, place, and time.  Psychiatric:        Behavior: Behavior normal.      Musculoskeletal Exam: C-spine was in good range of motion.  Shoulder joints, elbow joints, wrist joints with good range of motion.  Bilateral PIP and DIP thickening.  No synovitis was noted.  Hip joints, knee joints, ankles, MTPs and PIPs with good range of motion with no synovitis.  CDAI Exam: CDAI Score: -- Patient Global: --; Provider Global: -- Swollen: --; Tender: --  Joint Exam 08/03/2020   No joint exam has been documented for this visit   There is currently no information documented on the homunculus. Go to the Rheumatology activity and complete the homunculus joint exam.  Investigation: No additional findings.  Imaging: CT ANGIO HEAD W OR WO CONTRAST  Result Date: 07/08/2020 CLINICAL DATA:  Initial evaluation for acute stroke. EXAM: CT ANGIOGRAPHY HEAD AND NECK TECHNIQUE: Multidetector CT imaging of the head and neck was performed using the standard protocol during bolus administration of intravenous contrast. Multiplanar CT image reconstructions and MIPs were obtained to evaluate the vascular anatomy. Carotid stenosis measurements (when applicable) are obtained utilizing NASCET criteria, using the distal internal carotid diameter as the denominator. CONTRAST:  32m OMNIPAQUE IOHEXOL 350 MG/ML SOLN COMPARISON:  Previous CT and MRI from earlier the same day. FINDINGS: CTA NECK FINDINGS Aortic arch:  Examination mildly degraded by motion artifact. Visualized aortic arch normal caliber with normal 3 vessel morphology. Mild-to-moderate atheromatous change about the arch and origin of the great vessels without hemodynamically significant stenosis. Right carotid system: Right CCA tortuous proximally but is widely patent to the bifurcation without stenosis. Mild atheromatous change about the right carotid bulb/proximal right ICA without significant stenosis. Right ICA patent distally without stenosis, dissection or occlusion. Left carotid system: Left CCA tortuous proximally but is widely patent to the bifurcation without stenosis. Bulky calcified plaque about the left carotid bulb with associated stenosis of up to approximately 50% by NASCET criteria. Left ICA patent distally without stenosis, dissection or occlusion. Vertebral arteries: Both vertebral arteries arise from subclavian arteries. No proximal subclavian artery stenosis. Atheromatous change about the origins of both vertebral arteries with associated moderate stenosis on the right and no more than mild narrowing on the left. Vertebral arteries mildly tortuous but are otherwise patent without stenosis, dissection or occlusion. Skeleton: No visible acute osseous finding. No discrete or worrisome osseous lesions. Moderate cervical spondylosis at C4-5 through C6-7 without high-grade stenosis. Other neck: No other acute soft tissue abnormality within the neck. 1.6 cm right thyroid nodule noted. In the setting of significant comorbidities or limited life expectancy, no follow-up recommended (ref: J Am Coll Radiol. 2015 Feb;12(2): 143-50). No other mass or adenopathy. Upper chest: Visualized upper chest demonstrates no acute finding. Centrilobular emphysematous changes. Review of the MIP images confirms the above findings CTA HEAD FINDINGS Anterior circulation: Petrous segments patent bilaterally. Atheromatous change throughout the carotid siphons with no more  than mild narrowing. A1 segments patent bilaterally. Normal anterior communicating artery complex. Anterior cerebral arteries patent to their distal aspects without stenosis. No M1 stenosis or occlusion. Normal MCA bifurcations. Distal MCA branches well perfused and symmetric. Posterior circulation: Mild nonstenotic atheromatous plaque within the V4 segments bilaterally without significant stenosis. Both vertebral arteries patent to the vertebrobasilar junction. Left vertebral artery slightly dominant. Right PICA patent. Left PICA not definitely seen. Basilar patent to its distal aspect without stenosis. Superior cerebellar arteries patent bilaterally. Both PCAs primarily supplied via the basilar. PCAs widely patent proximally. Distal right PCA somewhat attenuated, in keeping with the acute right PCA distribution infarct. Venous sinuses: Grossly patent allowing for timing the contrast bolus. Anatomic variants: None significant.  No aneurysm. Review of the MIP images confirms the above findings IMPRESSION: 1. Negative CTA for emergent large vessel occlusion. 2. Bulky calcified plaque about the left carotid bulb with associated stenosis of up to approximately 50% by NASCET criteria. 3. Moderate right and mild left vertebral artery origin stenoses. 4. Diffuse tortuosity of the major arterial  vasculature of the head and neck, suggesting chronic underlying hypertension. 5. Aortic Atherosclerosis (ICD10-I70.0) and Emphysema (ICD10-J43.9). Electronically Signed   By: Jeannine Boga M.D.   On: 07/08/2020 00:13   CT HEAD WO CONTRAST  Result Date: 07/07/2020 CLINICAL DATA:  Right visual difficulties, initial encounter EXAM: CT HEAD WITHOUT CONTRAST TECHNIQUE: Contiguous axial images were obtained from the base of the skull through the vertex without intravenous contrast. COMPARISON:  01/07/2017 FINDINGS: Brain: Encephalomalacia is noted in the medial aspect of the right cerebellar hemisphere consistent with prior  infarct. This is new from the prior exam. Mild atrophic changes are seen. Chronic white matter ischemic changes are noted as well. Previously seen para falcine subdural hematoma has resolved in the interval. No acute hemorrhage is noted. There is a geographic area of decreased attenuation identified in the right occipital lobe medially which was not visualized on the prior exam and suspicious for acute infarct. Vascular: No hyperdense vessel or unexpected calcification. Skull: Normal. Negative for fracture or focal lesion. Sinuses/Orbits: Orbits and their contents are within normal limits. Mild mucosal thickening is noted within the maxillary antra bilaterally. Other: None. IMPRESSION: Geographic area of decreased attenuation identified in the medial aspect of the right occipital lobe consistent with acute infarct. MRI may be helpful for further evaluation. Area of encephalomalacia in the posteromedial aspect of the right cerebellar hemisphere consistent with prior infarct. Chronic atrophic and ischemic changes. Critical Value/emergent results were called by telephone at the time of interpretation on 07/07/2020 at 3:53 pm to Dr. Carmin Muskrat , who verbally acknowledged these results. Electronically Signed   By: Inez Catalina M.D.   On: 07/07/2020 15:55   CT ANGIO NECK W OR WO CONTRAST  Result Date: 07/08/2020 CLINICAL DATA:  Initial evaluation for acute stroke. EXAM: CT ANGIOGRAPHY HEAD AND NECK TECHNIQUE: Multidetector CT imaging of the head and neck was performed using the standard protocol during bolus administration of intravenous contrast. Multiplanar CT image reconstructions and MIPs were obtained to evaluate the vascular anatomy. Carotid stenosis measurements (when applicable) are obtained utilizing NASCET criteria, using the distal internal carotid diameter as the denominator. CONTRAST:  74m OMNIPAQUE IOHEXOL 350 MG/ML SOLN COMPARISON:  Previous CT and MRI from earlier the same day. FINDINGS: CTA NECK  FINDINGS Aortic arch: Examination mildly degraded by motion artifact. Visualized aortic arch normal caliber with normal 3 vessel morphology. Mild-to-moderate atheromatous change about the arch and origin of the great vessels without hemodynamically significant stenosis. Right carotid system: Right CCA tortuous proximally but is widely patent to the bifurcation without stenosis. Mild atheromatous change about the right carotid bulb/proximal right ICA without significant stenosis. Right ICA patent distally without stenosis, dissection or occlusion. Left carotid system: Left CCA tortuous proximally but is widely patent to the bifurcation without stenosis. Bulky calcified plaque about the left carotid bulb with associated stenosis of up to approximately 50% by NASCET criteria. Left ICA patent distally without stenosis, dissection or occlusion. Vertebral arteries: Both vertebral arteries arise from subclavian arteries. No proximal subclavian artery stenosis. Atheromatous change about the origins of both vertebral arteries with associated moderate stenosis on the right and no more than mild narrowing on the left. Vertebral arteries mildly tortuous but are otherwise patent without stenosis, dissection or occlusion. Skeleton: No visible acute osseous finding. No discrete or worrisome osseous lesions. Moderate cervical spondylosis at C4-5 through C6-7 without high-grade stenosis. Other neck: No other acute soft tissue abnormality within the neck. 1.6 cm right thyroid nodule noted. In the setting of significant  comorbidities or limited life expectancy, no follow-up recommended (ref: J Am Coll Radiol. 2015 Feb;12(2): 143-50). No other mass or adenopathy. Upper chest: Visualized upper chest demonstrates no acute finding. Centrilobular emphysematous changes. Review of the MIP images confirms the above findings CTA HEAD FINDINGS Anterior circulation: Petrous segments patent bilaterally. Atheromatous change throughout the carotid  siphons with no more than mild narrowing. A1 segments patent bilaterally. Normal anterior communicating artery complex. Anterior cerebral arteries patent to their distal aspects without stenosis. No M1 stenosis or occlusion. Normal MCA bifurcations. Distal MCA branches well perfused and symmetric. Posterior circulation: Mild nonstenotic atheromatous plaque within the V4 segments bilaterally without significant stenosis. Both vertebral arteries patent to the vertebrobasilar junction. Left vertebral artery slightly dominant. Right PICA patent. Left PICA not definitely seen. Basilar patent to its distal aspect without stenosis. Superior cerebellar arteries patent bilaterally. Both PCAs primarily supplied via the basilar. PCAs widely patent proximally. Distal right PCA somewhat attenuated, in keeping with the acute right PCA distribution infarct. Venous sinuses: Grossly patent allowing for timing the contrast bolus. Anatomic variants: None significant.  No aneurysm. Review of the MIP images confirms the above findings IMPRESSION: 1. Negative CTA for emergent large vessel occlusion. 2. Bulky calcified plaque about the left carotid bulb with associated stenosis of up to approximately 50% by NASCET criteria. 3. Moderate right and mild left vertebral artery origin stenoses. 4. Diffuse tortuosity of the major arterial vasculature of the head and neck, suggesting chronic underlying hypertension. 5. Aortic Atherosclerosis (ICD10-I70.0) and Emphysema (ICD10-J43.9). Electronically Signed   By: Jeannine Boga M.D.   On: 07/08/2020 00:13   MR BRAIN WO CONTRAST  Result Date: 07/07/2020 CLINICAL DATA:  Abnormal CT with stroke EXAM: MRI HEAD WITHOUT CONTRAST TECHNIQUE: Multiplanar, multiecho pulse sequences of the brain and surrounding structures were obtained without intravenous contrast. COMPARISON:  Correlation made with CT earlier same day FINDINGS: Brain: There is restricted diffusion within the right occipital lobe  and posteromedial right temporal lobe. Chronic infarcts of the right cerebellum with chronic blood products. Prominence of the ventricles and sulci reflects generalized parenchymal volume loss. Patchy foci of T2 hyperintensity in the supratentorial white matter are nonspecific but probably reflect mild chronic microvascular ischemic changes. There is no intracranial mass or mass effect. No hydrocephalus or extra-axial collection. Scattered foci of susceptibility hypointensity in the cerebral white matter reflecting chronic microhemorrhages. Vascular: Major vessel flow voids at the skull base are preserved. Skull and upper cervical spine: Normal marrow signal is preserved. Sinuses/Orbits: Paranasal sinus mucosal thickening. Orbits are unremarkable. Other: Sella is unremarkable.  Mastoid air cells are clear. IMPRESSION: Acute right PCA territory infarction involving occipitotemporal lobes. Chronic right cerebellar infarcts with chronic blood products. Mild chronic microvascular ischemic changes. Scattered chronic microhemorrhages in a pattern more suggestive of amyloid angiopathy rather than hypertension. Electronically Signed   By: Macy Mis M.D.   On: 07/07/2020 18:10   IR US Guide Vasc Access Right  Result Date: 07/09/2020 INDICATION: Tyler Norris is a 86 y.o. male with a past medical history of hypertension, hyperlipidemia, pericardial effusion, AAA, pneumonia, Barrett's esophagus, GERD, nephrolithiasis, SLE, anemia, OSA, fibromyalgia, and arthritis. He presented to [facility] ED 07/07/2020 with complaints of left lateral visual field loss and dizziness. In ED, CT head revealed acute CVA (right PCA territory infarction involving the occipitotemporal lobes). Neurology was consulted and patient was admitted for further management. Further imaging studies revealed moderate right VA stenosis at the origin. EXAM: ULTRASOUND-GUIDED VASCULAR ACCESS DIAGNOSTIC CEREBRAL ANGIOGRAM COMPARISON:  CT/CT angiogram  of the  head and neck Jul 07, 2020 MEDICATIONS: 15 mg hydralazine ANESTHESIA/SEDATION: Versed 1 mg IV; Fentanyl 25 mcg IV Moderate Sedation Time:  51 The patient was continuously monitored during the procedure by the interventional radiology nurse under my direct supervision. CONTRAST:  240 mL of Omnipaque 240 milligram/mL FLUOROSCOPY TIME:  Fluoroscopy Time: 22 minutes 6 seconds (1,997 mGy). COMPLICATIONS: None immediate. TECHNIQUE: Informed written consent was obtained from the patient after a thorough discussion of the procedural risks, benefits and alternatives. All questions were addressed. Maximal Sterile Barrier Technique was utilized including caps, mask, sterile gowns, sterile gloves, sterile drape, hand hygiene and skin antiseptic. A timeout was performed prior to the initiation of the procedure. The right groin was prepped and draped in the usual sterile fashion. Using a micropuncture kit and the modified Seldinger technique, access was gained to the right common femoral artery and a 5 French sheath was placed. Under fluoroscopy, a 5 [place] Berenstein 2 catheter was navigated over a 0.035" Terumo Glidewire into the aortic arch. The catheter was placed into the left subclavian artery. Frontal and lateral angiograms of the neck were obtained. Multiple attempts to catheterize the left vertebral artery proved unsuccessful due to tortuosity. The catheter was then retracted and placed into the right common carotid artery. Frontal and lateral angiograms of the neck were obtained. Under biplane roadmap, the catheter was subsequently placed into the left internal carotid artery. Frontal and lateral angiograms of the head were obtained. Multiple attempts to catheterize the right subclavian and right common carotid arteries with the Berenstein 2 catheter proved unsuccessful. Then, a 5 [place] Simmons 2 was navigated over the wire into the aortic arch. The catheter tip was reformed in the left subclavian artery. The catheter  was then placed into the innominate artery. Frontal angiogram was obtained. The catheter was then advanced into the proximal left common carotid artery. Frontal and lateral angiograms of the neck were obtained. Then, frontal, lateral and magnified right anterior oblique and lateral views of the head were obtained. The catheter was then retracted and the catheter tip was placed into the proximal right subclavian artery. Frontal and lateral angiograms of the neck were obtained followed by magnified oblique views of the neck, centered on the right vertebral artery origin. The catheter was again placed into the left subclavian artery and advanced into the left vertebral artery. Frontal, lateral and magnified oblique views of the head were obtained. The catheter was subsequently withdrawn. Right common femoral artery angiograms were obtained with right anterior oblique and lateral views. Then, a 5 [place] Exoseal was utilized for access closure. Hemostasis was achieved with a 5 minutes of manual pressure. FINDINGS: Left subclavian artery angiograms: Atherosclerotic plaque is seen in the left subclavian artery, just proximal to the origin of the left vertebral artery. No stenosis at the origin of the left vertebral artery which has increased tortuosity at the V1 and V2 segments. Left common carotid artery angiograms: Calcified atherosclerotic plaques in the left carotid bifurcation without hemodynamically significant stenosis. Mild ectasia of the distal cervical segment of the left ICA. Left internal carotid artery angiograms: There is brisk contrast opacification of the left ACA and MCA vascular trees. Luminal caliber is smooth and tapering. No hemodynamically significant stenosis. No aneurysm or anterior venous malformation. Visualized veins venous sinuses are patent. Innominate artery angiogram: Type 3 aortic arch with severe tortuosity of the innominate artery. Calcified plaque is noted at its bifurcation without  hemodynamically significant stenosis. Right common carotid artery angiograms: Increased  tortuosity of the proximal right common carotid artery. Atherosclerotic changes are noted in the right carotid bifurcation without hemodynamically significant stenosis. Mild ectasias noted at the distal cervical segment of the right ICA. Right common carotid artery angiograms-cranial views: There is brisk contrast opacification of the right ACA and MCA vascular trees. Luminal caliber is smooth and tapering. No hemodynamically significant stenosis. No aneurysm or anterior venous malformation. Visualized veins venous sinuses are patent. Right subclavian artery angiograms: Calcified atherosclerotic plaques are seen at the origin of the right subclavian artery without hemodynamically significant stenosis. Approximately 50% stenosis at the origin of the right vertebral artery. Remainder of the right vertebral artery is opacified up to the vertebrobasilar junction and no further focal stenosis seen. Left vertebral artery angiograms: There is brisk contrast opacification of the left vertebral artery, basilar artery bilateral posterior cerebral arteries. Opacification of the distal right vertebral artery seen by contrast reflux. Luminal caliber is smooth and tapering. No hemodynamically significant stenosis. No aneurysm or anterior venous malformation. Visualized veins venous sinuses are patent. Right femoral artery angiograms: Normal course and caliber of the right femoral artery and femoral bifurcation. The access at the level of the common femoral artery. PROCEDURE: N/A. IMPRESSION: 1. 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 2. Approximately 50% stenosis at the origin of the right vertebral artery. 3. No significant stenosis at the origin of the left vertebral artery. 4. Atherosclerotic changes of the bilateral carotid bifurcation without hemodynamically  significant stenosis. Electronically Signed   By: Pedro Earls M.D.   On: 07/09/2020 17:10   ECHOCARDIOGRAM COMPLETE  Result Date: 07/09/2020    ECHOCARDIOGRAM REPORT   Patient Name:   Tyler Norris Date of Exam: 07/09/2020 Medical Rec #:  [id number]          Height:       69.5 in Accession #:    [id number]         Weight:       214.0 lb Date of Birth:  22-Dec-1934           BSA:          2.137 m Patient Age:    82 years           BP:           129/80 mmHg Patient Gender: M                  HR:           69 bpm. Exam Location:  Inpatient Procedure: 2D Echo Indications:    stroke  History:        Patient has prior history of Echocardiogram examinations, most                 recent 11/04/2014. Lupus; Risk Factors:Hypertension and Sleep                 Apnea.  Sonographer:    Johny Chess Referring Phys: [id number] [place]  1. Left ventricular ejection fraction, by estimation, is 55 to 60%. The left ventricle has normal function. The left ventricle has no regional wall motion abnormalities. There is mild left ventricular hypertrophy. Left ventricular diastolic parameters are consistent with Grade I diastolic dysfunction (impaired relaxation).  2. Right ventricular systolic function is normal. The right ventricular size is normal. There is normal pulmonary artery systolic pressure.  3. Left atrial size was moderately dilated.  4.  Right atrial size was moderately dilated.  5. The mitral valve is normal in structure. Mild mitral valve regurgitation. No evidence of mitral stenosis.  6. The aortic valve is tricuspid. There is mild calcification of the aortic valve. Aortic valve regurgitation is not visualized. No aortic stenosis is present.  7. The inferior vena cava is dilated in size with >50% respiratory variability, suggesting right atrial pressure of 8 mmHg. FINDINGS  Left Ventricle: Left ventricular ejection fraction, by estimation, is 55 to 60%. The left ventricle has normal  function. The left ventricle has no regional wall motion abnormalities. The left ventricular internal cavity size was normal in size. There is  mild left ventricular hypertrophy. Left ventricular diastolic parameters are consistent with Grade I diastolic dysfunction (impaired relaxation). Right Ventricle: The right ventricular size is normal. No increase in right ventricular wall thickness. Right ventricular systolic function is normal. There is normal pulmonary artery systolic pressure. The tricuspid regurgitant velocity is 2.65 m/s, and  with an assumed right atrial pressure of 5 mmHg, the estimated right ventricular systolic pressure is 40.1 mmHg. Left Atrium: Left atrial size was moderately dilated. Right Atrium: Right atrial size was moderately dilated. Pericardium: There is no evidence of pericardial effusion. Mitral Valve: The mitral valve is normal in structure. Mild mitral annular calcification. Mild mitral valve regurgitation. No evidence of mitral valve stenosis. Tricuspid Valve: The tricuspid valve is normal in structure. Tricuspid valve regurgitation is mild . No evidence of tricuspid stenosis. Aortic Valve: The aortic valve is tricuspid. There is mild calcification of the aortic valve. Aortic valve regurgitation is not visualized. No aortic stenosis is present. Pulmonic Valve: The pulmonic valve was not well visualized. Pulmonic valve regurgitation is trivial. No evidence of pulmonic stenosis. Aorta: The aortic root is normal in size and structure. Venous: The inferior vena cava is dilated in size with greater than 50% respiratory variability, suggesting right atrial pressure of 8 mmHg. IAS/Shunts: No atrial level shunt detected by color flow Doppler.  LEFT VENTRICLE PLAX 2D LVIDd:         5.20 cm      Diastology LVIDs:         3.80 cm      LV e' medial:    6.42 cm/s LV PW:         1.30 cm      LV E/e' medial:  9.4 LV IVS:        1.20 cm      LV e' lateral:   9.90 cm/s LVOT diam:     2.20 cm      LV  E/e' lateral: 6.1 LV SV:         73 LV SV Index:   34 LVOT Area:     3.80 cm  LV Volumes (MOD) LV vol d, MOD A2C: 109.0 ml LV vol d, MOD A4C: 133.0 ml LV vol s, MOD A2C: 58.5 ml LV vol s, MOD A4C: 79.1 ml LV SV MOD A2C:     50.5 ml LV SV MOD A4C:     133.0 ml LV SV MOD BP:      54.7 ml RIGHT VENTRICLE            IVC RV S prime:     9.57 cm/s  IVC diam: 2.50 cm TAPSE (M-mode): 1.1 cm LEFT ATRIUM             Index       RIGHT ATRIUM  Index LA diam:        4.10 cm 1.92 cm/m  RA Area:     17.70 cm LA Vol (A2C):   79.3 ml 37.11 ml/m RA Volume:   46.10 ml  21.57 ml/m LA Vol (A4C):   69.0 ml 32.29 ml/m LA Biplane Vol: 78.3 ml 36.64 ml/m  AORTIC VALVE LVOT Vmax:   85.80 cm/s LVOT Vmean:  53.800 cm/s LVOT VTI:    0.191 m  AORTA Ao Root diam: 3.40 cm Ao Asc diam:  3.50 cm MITRAL VALVE               TRICUSPID VALVE MV Area (PHT): 2.66 cm    TR Peak grad:   28.1 mmHg MV Decel Time: 285 msec    TR Vmax:        265.00 cm/s MV E velocity: 60.60 cm/s MV A velocity: 90.00 cm/s  SHUNTS MV E/A ratio:  0.67        Systemic VTI:  0.19 m                            Systemic Diam: 2.20 cm Glori Bickers MD Electronically signed by Glori Bickers MD Signature Date/Time: 07/09/2020/2:45:06 PM    Final    IR ANGIO INTRA EXTRACRAN SEL INTERNAL CAROTID BILAT MOD SED  Result Date: 07/09/2020 INDICATION: Tyler Norris is a 86 y.o. male with a past medical history of hypertension, hyperlipidemia, pericardial effusion, AAA, pneumonia, Barrett's esophagus, GERD, nephrolithiasis, SLE, anemia, OSA, fibromyalgia, and arthritis. He presented to [facility] ED 07/07/2020 with complaints of left lateral visual field loss and dizziness. In ED, CT head revealed acute CVA (right PCA territory infarction involving the occipitotemporal lobes). Neurology was consulted and patient was admitted for further management. Further imaging studies revealed moderate right VA stenosis at the origin. EXAM: ULTRASOUND-GUIDED VASCULAR ACCESS DIAGNOSTIC  CEREBRAL ANGIOGRAM COMPARISON:  CT/CT angiogram of the head and neck Jul 07, 2020 MEDICATIONS: 15 mg hydralazine ANESTHESIA/SEDATION: Versed 1 mg IV; Fentanyl 25 mcg IV Moderate Sedation Time:  38 The patient was continuously monitored during the procedure by the interventional radiology nurse under my direct supervision. CONTRAST:  240 mL of Omnipaque 240 milligram/mL FLUOROSCOPY TIME:  Fluoroscopy Time: 22 minutes 6 seconds (1,997 mGy). COMPLICATIONS: None immediate. TECHNIQUE: Informed written consent was obtained from the patient after a thorough discussion of the procedural risks, benefits and alternatives. All questions were addressed. Maximal Sterile Barrier Technique was utilized including caps, mask, sterile gowns, sterile gloves, sterile drape, hand hygiene and skin antiseptic. A timeout was performed prior to the initiation of the procedure. The right groin was prepped and draped in the usual sterile fashion. Using a micropuncture kit and the modified Seldinger technique, access was gained to the right common femoral artery and a 5 French sheath was placed. Under fluoroscopy, a 5 [place] Berenstein 2 catheter was navigated over a 0.035" Terumo Glidewire into the aortic arch. The catheter was placed into the left subclavian artery. Frontal and lateral angiograms of the neck were obtained. Multiple attempts to catheterize the left vertebral artery proved unsuccessful due to tortuosity. The catheter was then retracted and placed into the right common carotid artery. Frontal and lateral angiograms of the neck were obtained. Under biplane roadmap, the catheter was subsequently placed into the left internal carotid artery. Frontal and lateral angiograms of the head were obtained. Multiple attempts to catheterize the right subclavian and right common carotid arteries with the [facility] 2  catheter proved unsuccessful. Then, a 5 [place] Simmons 2 was navigated over the wire into the aortic arch. The catheter tip  was reformed in the left subclavian artery. The catheter was then placed into the innominate artery. Frontal angiogram was obtained. The catheter was then advanced into the proximal left common carotid artery. Frontal and lateral angiograms of the neck were obtained. Then, frontal, lateral and magnified right anterior oblique and lateral views of the head were obtained. The catheter was then retracted and the catheter tip was placed into the proximal right subclavian artery. Frontal and lateral angiograms of the neck were obtained followed by magnified oblique views of the neck, centered on the right vertebral artery origin. The catheter was again placed into the left subclavian artery and advanced into the left vertebral artery. Frontal, lateral and magnified oblique views of the head were obtained. The catheter was subsequently withdrawn. Right common femoral artery angiograms were obtained with right anterior oblique and lateral views. Then, a 5 [place] Exoseal was utilized for access closure. Hemostasis was achieved with a 5 minutes of manual pressure. FINDINGS: Left subclavian artery angiograms: Atherosclerotic plaque is seen in the left subclavian artery, just proximal to the origin of the left vertebral artery. No stenosis at the origin of the left vertebral artery which has increased tortuosity at the V1 and V2 segments. Left common carotid artery angiograms: Calcified atherosclerotic plaques in the left carotid bifurcation without hemodynamically significant stenosis. Mild ectasia of the distal cervical segment of the left ICA. Left internal carotid artery angiograms: There is brisk contrast opacification of the left ACA and MCA vascular trees. Luminal caliber is smooth and tapering. No hemodynamically significant stenosis. No aneurysm or anterior venous malformation. Visualized veins venous sinuses are patent. Innominate artery angiogram: Type 3 aortic arch with severe tortuosity of the innominate artery.  Calcified plaque is noted at its bifurcation without hemodynamically significant stenosis. Right common carotid artery angiograms: Increased tortuosity of the proximal right common carotid artery. Atherosclerotic changes are noted in the right carotid bifurcation without hemodynamically significant stenosis. Mild ectasias noted at the distal cervical segment of the right ICA. Right common carotid artery angiograms-cranial views: There is brisk contrast opacification of the right ACA and MCA vascular trees. Luminal caliber is smooth and tapering. No hemodynamically significant stenosis. No aneurysm or anterior venous malformation. Visualized veins venous sinuses are patent. Right subclavian artery angiograms: Calcified atherosclerotic plaques are seen at the origin of the right subclavian artery without hemodynamically significant stenosis. Approximately 50% stenosis at the origin of the right vertebral artery. Remainder of the right vertebral artery is opacified up to the vertebrobasilar junction and no further focal stenosis seen. Left vertebral artery angiograms: There is brisk contrast opacification of the left vertebral artery, basilar artery bilateral posterior cerebral arteries. Opacification of the distal right vertebral artery seen by contrast reflux. Luminal caliber is smooth and tapering. No hemodynamically significant stenosis. No aneurysm or anterior venous malformation. Visualized veins venous sinuses are patent. Right femoral artery angiograms: Normal course and caliber of the right femoral artery and femoral bifurcation. The access at the level of the common femoral artery. PROCEDURE: N/A. IMPRESSION: 1. 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 2. Approximately 50% stenosis at the origin of the right vertebral artery. 3. No significant stenosis at the origin of the left vertebral artery. 4. Atherosclerotic changes of the  bilateral carotid bifurcation without hemodynamically significant stenosis. Electronically Signed   By: Erven Colla  Karenann Cai M.D.   On: 07/09/2020 17:10   IR ANGIO VERTEBRAL SEL SUBCLAVIAN INNOMINATE UNI R MOD SED  Result Date: 07/09/2020 INDICATION: Tyler Norris is a 86 y.o. male with a past medical history of hypertension, hyperlipidemia, pericardial effusion, AAA, pneumonia, Barrett's esophagus, GERD, nephrolithiasis, SLE, anemia, OSA, fibromyalgia, and arthritis. He presented to [facility] ED 07/07/2020 with complaints of left lateral visual field loss and dizziness. In ED, CT head revealed acute CVA (right PCA territory infarction involving the occipitotemporal lobes). Neurology was consulted and patient was admitted for further management. Further imaging studies revealed moderate right VA stenosis at the origin. EXAM: ULTRASOUND-GUIDED VASCULAR ACCESS DIAGNOSTIC CEREBRAL ANGIOGRAM COMPARISON:  CT/CT angiogram of the head and neck Jul 07, 2020 MEDICATIONS: 15 mg hydralazine ANESTHESIA/SEDATION: Versed 1 mg IV; Fentanyl 25 mcg IV Moderate Sedation Time:  26 The patient was continuously monitored during the procedure by the interventional radiology nurse under my direct supervision. CONTRAST:  240 mL of Omnipaque 240 milligram/mL FLUOROSCOPY TIME:  Fluoroscopy Time: 22 minutes 6 seconds (1,997 mGy). COMPLICATIONS: None immediate. TECHNIQUE: Informed written consent was obtained from the patient after a thorough discussion of the procedural risks, benefits and alternatives. All questions were addressed. Maximal Sterile Barrier Technique was utilized including caps, mask, sterile gowns, sterile gloves, sterile drape, hand hygiene and skin antiseptic. A timeout was performed prior to the initiation of the procedure. The right groin was prepped and draped in the usual sterile fashion. Using a micropuncture kit and the modified Seldinger technique, access was gained to the right common femoral artery and a  5 French sheath was placed. Under fluoroscopy, a 5 [place] Berenstein 2 catheter was navigated over a 0.035" Terumo Glidewire into the aortic arch. The catheter was placed into the left subclavian artery. Frontal and lateral angiograms of the neck were obtained. Multiple attempts to catheterize the left vertebral artery proved unsuccessful due to tortuosity. The catheter was then retracted and placed into the right common carotid artery. Frontal and lateral angiograms of the neck were obtained. Under biplane roadmap, the catheter was subsequently placed into the left internal carotid artery. Frontal and lateral angiograms of the head were obtained. Multiple attempts to catheterize the right subclavian and right common carotid arteries with the Berenstein 2 catheter proved unsuccessful. Then, a 5 [place] Simmons 2 was navigated over the wire into the aortic arch. The catheter tip was reformed in the left subclavian artery. The catheter was then placed into the innominate artery. Frontal angiogram was obtained. The catheter was then advanced into the proximal left common carotid artery. Frontal and lateral angiograms of the neck were obtained. Then, frontal, lateral and magnified right anterior oblique and lateral views of the head were obtained. The catheter was then retracted and the catheter tip was placed into the proximal right subclavian artery. Frontal and lateral angiograms of the neck were obtained followed by magnified oblique views of the neck, centered on the right vertebral artery origin. The catheter was again placed into the left subclavian artery and advanced into the left vertebral artery. Frontal, lateral and magnified oblique views of the head were obtained. The catheter was subsequently withdrawn. Right common femoral artery angiograms were obtained with right anterior oblique and lateral views. Then, a 5 [place] Exoseal was utilized for access closure. Hemostasis was achieved with a 5 minutes of  manual pressure. FINDINGS: Left subclavian artery angiograms: Atherosclerotic plaque is seen in the left subclavian artery, just proximal to the origin of the left vertebral artery. No  stenosis at the origin of the left vertebral artery which has increased tortuosity at the V1 and V2 segments. Left common carotid artery angiograms: Calcified atherosclerotic plaques in the left carotid bifurcation without hemodynamically significant stenosis. Mild ectasia of the distal cervical segment of the left ICA. Left internal carotid artery angiograms: There is brisk contrast opacification of the left ACA and MCA vascular trees. Luminal caliber is smooth and tapering. No hemodynamically significant stenosis. No aneurysm or anterior venous malformation. Visualized veins venous sinuses are patent. Innominate artery angiogram: Type 3 aortic arch with severe tortuosity of the innominate artery. Calcified plaque is noted at its bifurcation without hemodynamically significant stenosis. Right common carotid artery angiograms: Increased tortuosity of the proximal right common carotid artery. Atherosclerotic changes are noted in the right carotid bifurcation without hemodynamically significant stenosis. Mild ectasias noted at the distal cervical segment of the right ICA. Right common carotid artery angiograms-cranial views: There is brisk contrast opacification of the right ACA and MCA vascular trees. Luminal caliber is smooth and tapering. No hemodynamically significant stenosis. No aneurysm or anterior venous malformation. Visualized veins venous sinuses are patent. Right subclavian artery angiograms: Calcified atherosclerotic plaques are seen at the origin of the right subclavian artery without hemodynamically significant stenosis. Approximately 50% stenosis at the origin of the right vertebral artery. Remainder of the right vertebral artery is opacified up to the vertebrobasilar junction and no further focal stenosis seen. Left  vertebral artery angiograms: There is brisk contrast opacification of the left vertebral artery, basilar artery bilateral posterior cerebral arteries. Opacification of the distal right vertebral artery seen by contrast reflux. Luminal caliber is smooth and tapering. No hemodynamically significant stenosis. No aneurysm or anterior venous malformation. Visualized veins venous sinuses are patent. Right femoral artery angiograms: Normal course and caliber of the right femoral artery and femoral bifurcation. The access at the level of the common femoral artery. PROCEDURE: N/A. IMPRESSION: 1. 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 2. Approximately 50% stenosis at the origin of the right vertebral artery. 3. No significant stenosis at the origin of the left vertebral artery. 4. Atherosclerotic changes of the bilateral carotid bifurcation without hemodynamically significant stenosis. Electronically Signed   By: Pedro Earls M.D.   On: 07/09/2020 17:10   IR ANGIO VERTEBRAL SEL VERTEBRAL UNI L MOD SED  Result Date: 07/09/2020 INDICATION: Tyler Norris is a 86 y.o. male with a past medical history of hypertension, hyperlipidemia, pericardial effusion, AAA, pneumonia, Barrett's esophagus, GERD, nephrolithiasis, SLE, anemia, OSA, fibromyalgia, and arthritis. He presented to [facility] ED 07/07/2020 with complaints of left lateral visual field loss and dizziness. In ED, CT head revealed acute CVA (right PCA territory infarction involving the occipitotemporal lobes). Neurology was consulted and patient was admitted for further management. Further imaging studies revealed moderate right VA stenosis at the origin. EXAM: ULTRASOUND-GUIDED VASCULAR ACCESS DIAGNOSTIC CEREBRAL ANGIOGRAM COMPARISON:  CT/CT angiogram of the head and neck Jul 07, 2020 MEDICATIONS: 15 mg hydralazine ANESTHESIA/SEDATION: Versed 1 mg IV; Fentanyl 25 mcg IV  Moderate Sedation Time:  72 The patient was continuously monitored during the procedure by the interventional radiology nurse under my direct supervision. CONTRAST:  240 mL of Omnipaque 240 milligram/mL FLUOROSCOPY TIME:  Fluoroscopy Time: 22 minutes 6 seconds (1,997 mGy). COMPLICATIONS: None immediate. TECHNIQUE: Informed written consent was obtained from the patient after a thorough discussion of the procedural risks, benefits and alternatives. All questions were addressed. Maximal Sterile Barrier Technique was  utilized including caps, mask, sterile gowns, sterile gloves, sterile drape, hand hygiene and skin antiseptic. A timeout was performed prior to the initiation of the procedure. The right groin was prepped and draped in the usual sterile fashion. Using a micropuncture kit and the modified Seldinger technique, access was gained to the right common femoral artery and a 5 French sheath was placed. Under fluoroscopy, a 5 [place] Berenstein 2 catheter was navigated over a 0.035" Terumo Glidewire into the aortic arch. The catheter was placed into the left subclavian artery. Frontal and lateral angiograms of the neck were obtained. Multiple attempts to catheterize the left vertebral artery proved unsuccessful due to tortuosity. The catheter was then retracted and placed into the right common carotid artery. Frontal and lateral angiograms of the neck were obtained. Under biplane roadmap, the catheter was subsequently placed into the left internal carotid artery. Frontal and lateral angiograms of the head were obtained. Multiple attempts to catheterize the right subclavian and right common carotid arteries with the Berenstein 2 catheter proved unsuccessful. Then, a 5 [place] Simmons 2 was navigated over the wire into the aortic arch. The catheter tip was reformed in the left subclavian artery. The catheter was then placed into the innominate artery. Frontal angiogram was obtained. The catheter was then advanced into  the proximal left common carotid artery. Frontal and lateral angiograms of the neck were obtained. Then, frontal, lateral and magnified right anterior oblique and lateral views of the head were obtained. The catheter was then retracted and the catheter tip was placed into the proximal right subclavian artery. Frontal and lateral angiograms of the neck were obtained followed by magnified oblique views of the neck, centered on the right vertebral artery origin. The catheter was again placed into the left subclavian artery and advanced into the left vertebral artery. Frontal, lateral and magnified oblique views of the head were obtained. The catheter was subsequently withdrawn. Right common femoral artery angiograms were obtained with right anterior oblique and lateral views. Then, a 5 [place] Exoseal was utilized for access closure. Hemostasis was achieved with a 5 minutes of manual pressure. FINDINGS: Left subclavian artery angiograms: Atherosclerotic plaque is seen in the left subclavian artery, just proximal to the origin of the left vertebral artery. No stenosis at the origin of the left vertebral artery which has increased tortuosity at the V1 and V2 segments. Left common carotid artery angiograms: Calcified atherosclerotic plaques in the left carotid bifurcation without hemodynamically significant stenosis. Mild ectasia of the distal cervical segment of the left ICA. Left internal carotid artery angiograms: There is brisk contrast opacification of the left ACA and MCA vascular trees. Luminal caliber is smooth and tapering. No hemodynamically significant stenosis. No aneurysm or anterior venous malformation. Visualized veins venous sinuses are patent. Innominate artery angiogram: Type 3 aortic arch with severe tortuosity of the innominate artery. Calcified plaque is noted at its bifurcation without hemodynamically significant stenosis. Right common carotid artery angiograms: Increased tortuosity of the proximal  right common carotid artery. Atherosclerotic changes are noted in the right carotid bifurcation without hemodynamically significant stenosis. Mild ectasias noted at the distal cervical segment of the right ICA. Right common carotid artery angiograms-cranial views: There is brisk contrast opacification of the right ACA and MCA vascular trees. Luminal caliber is smooth and tapering. No hemodynamically significant stenosis. No aneurysm or anterior venous malformation. Visualized veins venous sinuses are patent. Right subclavian artery angiograms: Calcified atherosclerotic plaques are seen at the origin of the right subclavian artery without hemodynamically significant stenosis.  Approximately 50% stenosis at the origin of the right vertebral artery. Remainder of the right vertebral artery is opacified up to the vertebrobasilar junction and no further focal stenosis seen. Left vertebral artery angiograms: There is brisk contrast opacification of the left vertebral artery, basilar artery bilateral posterior cerebral arteries. Opacification of the distal right vertebral artery seen by contrast reflux. Luminal caliber is smooth and tapering. No hemodynamically significant stenosis. No aneurysm or anterior venous malformation. Visualized veins venous sinuses are patent. Right femoral artery angiograms: Normal course and caliber of the right femoral artery and femoral bifurcation. The access at the level of the common femoral artery. PROCEDURE: N/A. IMPRESSION: 1. 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 2. Approximately 50% stenosis at the origin of the right vertebral artery. 3. No significant stenosis at the origin of the left vertebral artery. 4. Atherosclerotic changes of the bilateral carotid bifurcation without hemodynamically significant stenosis. Electronically Signed   By: Pedro Earls M.D.   On: 07/09/2020 17:10     Recent Labs: Lab Results  Component Value Date   WBC 6.2 07/10/2020   HGB 12.1 (L) 07/10/2020   PLT 177 07/10/2020   NA 136 07/10/2020   K 3.5 07/10/2020   CL 105 07/10/2020   CO2 21 (L) 07/10/2020   GLUCOSE 125 (H) 07/10/2020   BUN 18 07/10/2020   CREATININE 0.98 07/10/2020   BILITOT 0.8 07/07/2020   ALKPHOS 33 (L) 07/07/2020   AST 19 07/07/2020   ALT 15 07/07/2020   PROT 7.1 07/07/2020   ALBUMIN 4.5 07/07/2020   CALCIUM 9.0 07/10/2020   GFRAA 87 03/04/2020    Speciality Comments: PLQ Eye Exam 02/02/2020 WNL @ [facility] Follow up in 1 year.  Procedures:  No procedures performed Allergies: Atorvastatin and Lipitor [atorvastatin calcium]   Assessment / Plan:     Visit Diagnoses: Other systemic lupus erythematosus with other organ involvement ([place]) - +ANA, +RF: -He denies any history of oral ulcers, nasal ulcers, malar rash, photosensitivity or sicca symptoms.  He has mild Raynaud's symptoms.  He continues to do well without any inflammatory arthritis or new symptoms.  High risk medication use - Plaquenil 200 mg twice daily. PLQ Eye Exam 02/02/2020.  He has eye examination coming up.  He had recent labs at the hospital in May which showed stable mild anemia.  We will check autoimmune labs at the follow-up visit.  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  History of gastroesophageal reflux (GERD)  Other proteinuria-he has had trace proteinuria in the past which was negative in January.  History of pleural effusion  History of sleep apnea  Essential hypertension-his systolic blood pressure is elevated.  Have advised him to monitor blood pressure closely.  OSA on CPAP  Thoracic aortic aneurysm without rupture ([place])  Cerebrovascular accident (CVA) due to other mechanism (HCC)-patient had a stroke about 10 days ago.  He states he has some residual visual issues and memory loss.  Memory  loss is improving.  Orders: No orders of the defined types were placed in this encounter.  No orders of the defined types were placed in this encounter.  .  Follow-Up Instructions: Return in about 5 months (around 01/03/2021) for Systemic lupus.   Bo Merino, MD  Note - This record has been created using Editor, commissioning.  Chart creation errors have been sought, but may not always  have  been located. Such creation errors do not reflect on  the standard of medical care.

## 2020-07-21 DIAGNOSIS — I69312 Visuospatial deficit and spatial neglect following cerebral infarction: Secondary | ICD-10-CM | POA: Diagnosis not present

## 2020-07-21 DIAGNOSIS — I69398 Other sequelae of cerebral infarction: Secondary | ICD-10-CM | POA: Diagnosis not present

## 2020-07-21 DIAGNOSIS — I69318 Other symptoms and signs involving cognitive functions following cerebral infarction: Secondary | ICD-10-CM | POA: Diagnosis not present

## 2020-07-21 DIAGNOSIS — R2681 Unsteadiness on feet: Secondary | ICD-10-CM | POA: Diagnosis not present

## 2020-07-21 DIAGNOSIS — I6522 Occlusion and stenosis of left carotid artery: Secondary | ICD-10-CM | POA: Diagnosis not present

## 2020-07-21 DIAGNOSIS — I6501 Occlusion and stenosis of right vertebral artery: Secondary | ICD-10-CM | POA: Diagnosis not present

## 2020-07-22 DIAGNOSIS — I6501 Occlusion and stenosis of right vertebral artery: Secondary | ICD-10-CM | POA: Diagnosis not present

## 2020-07-22 DIAGNOSIS — I69312 Visuospatial deficit and spatial neglect following cerebral infarction: Secondary | ICD-10-CM | POA: Diagnosis not present

## 2020-07-22 DIAGNOSIS — I69398 Other sequelae of cerebral infarction: Secondary | ICD-10-CM | POA: Diagnosis not present

## 2020-07-22 DIAGNOSIS — R2681 Unsteadiness on feet: Secondary | ICD-10-CM | POA: Diagnosis not present

## 2020-07-22 DIAGNOSIS — I6522 Occlusion and stenosis of left carotid artery: Secondary | ICD-10-CM | POA: Diagnosis not present

## 2020-07-22 DIAGNOSIS — I69318 Other symptoms and signs involving cognitive functions following cerebral infarction: Secondary | ICD-10-CM | POA: Diagnosis not present

## 2020-07-23 DIAGNOSIS — I69318 Other symptoms and signs involving cognitive functions following cerebral infarction: Secondary | ICD-10-CM | POA: Diagnosis not present

## 2020-07-23 DIAGNOSIS — I69398 Other sequelae of cerebral infarction: Secondary | ICD-10-CM | POA: Diagnosis not present

## 2020-07-23 DIAGNOSIS — R2681 Unsteadiness on feet: Secondary | ICD-10-CM | POA: Diagnosis not present

## 2020-07-23 DIAGNOSIS — I6522 Occlusion and stenosis of left carotid artery: Secondary | ICD-10-CM | POA: Diagnosis not present

## 2020-07-23 DIAGNOSIS — I6501 Occlusion and stenosis of right vertebral artery: Secondary | ICD-10-CM | POA: Diagnosis not present

## 2020-07-23 DIAGNOSIS — I69312 Visuospatial deficit and spatial neglect following cerebral infarction: Secondary | ICD-10-CM | POA: Diagnosis not present

## 2020-07-27 DIAGNOSIS — I69398 Other sequelae of cerebral infarction: Secondary | ICD-10-CM | POA: Diagnosis not present

## 2020-07-27 DIAGNOSIS — I6522 Occlusion and stenosis of left carotid artery: Secondary | ICD-10-CM | POA: Diagnosis not present

## 2020-07-27 DIAGNOSIS — I69312 Visuospatial deficit and spatial neglect following cerebral infarction: Secondary | ICD-10-CM | POA: Diagnosis not present

## 2020-07-27 DIAGNOSIS — I6501 Occlusion and stenosis of right vertebral artery: Secondary | ICD-10-CM | POA: Diagnosis not present

## 2020-07-27 DIAGNOSIS — R2681 Unsteadiness on feet: Secondary | ICD-10-CM | POA: Diagnosis not present

## 2020-07-27 DIAGNOSIS — I69318 Other symptoms and signs involving cognitive functions following cerebral infarction: Secondary | ICD-10-CM | POA: Diagnosis not present

## 2020-07-28 ENCOUNTER — Encounter: Payer: Self-pay | Admitting: *Deleted

## 2020-07-28 ENCOUNTER — Other Ambulatory Visit: Payer: Self-pay | Admitting: *Deleted

## 2020-07-28 DIAGNOSIS — I6501 Occlusion and stenosis of right vertebral artery: Secondary | ICD-10-CM | POA: Diagnosis not present

## 2020-07-28 DIAGNOSIS — I69312 Visuospatial deficit and spatial neglect following cerebral infarction: Secondary | ICD-10-CM | POA: Diagnosis not present

## 2020-07-28 DIAGNOSIS — R2681 Unsteadiness on feet: Secondary | ICD-10-CM | POA: Diagnosis not present

## 2020-07-28 DIAGNOSIS — I6522 Occlusion and stenosis of left carotid artery: Secondary | ICD-10-CM | POA: Diagnosis not present

## 2020-07-28 DIAGNOSIS — I69318 Other symptoms and signs involving cognitive functions following cerebral infarction: Secondary | ICD-10-CM | POA: Diagnosis not present

## 2020-07-28 DIAGNOSIS — I69398 Other sequelae of cerebral infarction: Secondary | ICD-10-CM | POA: Diagnosis not present

## 2020-07-28 NOTE — Patient Outreach (Signed)
[facility]) Care Management  07/28/2020  Tyler Norris [birth year] [id number]   [facility] outreach for EMMI stroke [facility]) RED ON [place] Day #   13        Date: Sunday 07/25/20 1000 Red Alert Reason: Had follow up appointment? No Scheduled a follow up appointment? No  Insurance: Medicare  [facility] admissions x 1  ED visits x 1 in the last 6 months  Last admission 07/07/20 to 07/11/20 Acute CVA within the right posterior cerebral artery (PCA) territory- left visual field loss  Outreach attempt #1 successful at [phone] Patient is able to verify HIPAA identifiers [place] Management RN reviewed and addressed red alert with patient  Consent: [facility] RN CM reviewed [facility] services with patient. Patient gave verbal consent for services [facility] telephonic RN CM.   Advised patient that there will be further automated EMMI- post discharge calls to assess how the patient is doing following the recent hospitalization Advised the patient that another call may be received from a nurse if any of their responses were abnormal. Patient voiced understanding and was appreciative of f/u call.  Transition of care services noted to be completed by primary care MD office staff - Dr Charlynne Cousins Transition of Care will be completed by primary care provider office who will refer to [facility] care management if needed.   EMMI:  Tyler Norris confirms on the Sunday outreach call he  Had not made his pcp follow up call but since his daughter has assisted to make the appointment for August 03 2020, Tuesday    Tyler Norris his concern with receiving an unclear voice message related to his 30 cardiac monitor He Norris the staff who called provided instructions for a machine he did not have    [facility] services reviewed and he agrees to a follow outreach    Social:  was living alone and was independent- Norris he is Hard of hearing ([place]) Has a cat at home. Retired. was in aviation  will need someone to  drive him places until he is cleared to drive. have multiple family members nearby that can assist  Past Medical History:  Diagnosis Date  . AAA (abdominal aortic aneurysm) ([place])    a. resolved by ct scan 09-2011  . Anemia   . Arthritis   . Barrett's esophagus   . Carpal tunnel syndrome, bilateral   . Essential hypertension   . Fibromyalgia   . GERD (gastroesophageal reflux disease)   . History of pneumonia   . Hyperlipidemia   . Lupus (systemic lupus erythematosus) ([place])   . Nephrolithiasis   . Pericardial effusion    a. 08/22/2014 Echo: EF 60-65%, mildly dil Ao root (10mm), mildly dil LA, mod-large partially organized pericardial effusion with possible early tamponade - asymptomatic on high dose nsaids-->conservative mgmt;  b. 08/2014 Limited Echo: EF 55-60%, Triv - small effusion.  . Sleep apnea    a. uses cpap every night   Patient Active Problem List   Diagnosis Date Noted  . CVA (cerebral vascular accident) ([place]) 07/07/2020  . SDH (subdural hematoma) ([place]) 01/08/2017  . Pleural effusion on left 06/02/2015  . Aneurysm of thoracic aorta ([place]) 06/02/2015  . SOB (shortness of breath) 08/22/2014  . Pericardial effusion 08/22/2014  . Lupus (systemic lupus erythematosus) ([place])   . 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 PE (pulmonary embolism) 06/05/2012  . Pleural effusion exudative 05/26/2012  . Essential hypertension 05/13/2012  . OSA on CPAP  05/13/2012  . Cardiomegaly 05/13/2012  . Abdominal aneurysm without mention of rupture 06/27/2011     DME: Grab bars - tub/shower;Wheelchair - [employer] - 2 wheels;Shower seat;Cane - single point  Current Outpatient Medications on File Prior to Visit  Medication Sig Dispense Refill  . acetaminophen (TYLENOL) 500 MG tablet Take 500 mg by mouth every 8 (eight) hours as needed for mild pain.    Marland Kitchen acyclovir (ZOVIRAX) 400 MG tablet Take 400 mg by mouth 2 (two) times daily.  0  . amLODipine (NORVASC) 5 MG tablet Take 1 tablet  (5 mg total) daily by mouth.    Marland Kitchen aspirin 81 MG tablet Take 81 mg by mouth daily.    Marland Kitchen Bioflavonoid Products (BIOFLEX PO) Take 1 tablet by mouth 2 (two) times daily.    . calcium-vitamin D (OSCAL WITH D) 500-200 MG-UNIT tablet Take 1 tablet by mouth daily with breakfast.    . clopidogrel (PLAVIX) 75 MG tablet Take 1 tablet (75 mg total) by mouth daily for 21 days. 21 tablet 0  . docusate sodium (COLACE) 100 MG capsule Take 100 mg by mouth 2 (two) times daily as needed for mild constipation.    . fenofibrate (TRICOR) 145 MG tablet Take 145 mg by mouth daily.    . ferrous sulfate 325 (65 FE) MG tablet Take 325 mg by mouth 2 (two) times daily with a meal.    . folic acid (FOLVITE) 948 MCG tablet Take 800 mcg by mouth daily.    . hydroxychloroquine (PLAQUENIL) 200 MG tablet Take one tablet by mouth twice daily Monday-Friday only. 120 tablet 0  . magnesium oxide (MAG-OX) 400 (241.3 MG) MG tablet Take 1 tablet (400 mg total) by mouth 3 (three) times daily. 60 tablet 0  . metFORMIN (GLUCOPHAGE) 500 MG tablet Take 500 mg by mouth daily.    . metoprolol tartrate (LOPRESSOR) 25 MG tablet Take 0.5 tablets (12.5 mg total) 2 (two) times daily by mouth. (Patient taking differently: Take 25 mg by mouth 2 (two) times daily.)    . Multiple Vitamin (MULTIVITAMIN WITH MINERALS) TABS Take 1 tablet by mouth daily.    . Omega-3 Fatty Acids (FISH OIL) 1200 MG CAPS Take 1,200 mg by mouth daily.     . pantoprazole (PROTONIX) 40 MG tablet Take 40 mg by mouth 2 (two) times daily.    . rosuvastatin (CRESTOR) 5 MG tablet Take 5 mg by mouth every other day.    . tadalafil (CIALIS) 20 MG tablet Take 20 mg by mouth daily as needed for erectile dysfunction.    . tamsulosin (FLOMAX) 0.4 MG CAPS capsule Take 0.4 mg by mouth daily.    Marland Kitchen telmisartan-hydrochlorothiazide (MICARDIS HCT) 80-12.5 MG tablet Take 1 tablet by mouth daily.    . vitamin C (ASCORBIC ACID) 500 MG tablet Take 500 mg by mouth daily.    . vitamin E 400 UNIT  capsule Take 400 Units by mouth daily.     No current facility-administered medications on file prior to visit.     Plan: Patient agrees to the care plan and follow up with in 14-21 business days Pt encouraged to return a call to [place] CM prn [facility] Welcome letter sent Goals Addressed              This Visit's Progress     Patient Stated   .  Make and Keep All Appointments (pt-stated)   On track     Timeframe:  Short-Term Goal Priority:  Low Start  Date:                       07/28/20      Expected End Date:        08/26/20               Follow Up Date 08/11/20  Barriers: Knowledge  - ask family or friend for a ride - call to cancel if needed - keep a calendar with appointment dates     Notes:  07/28/20 EMMI stroke concerns with follow up appointment resolved as he has made a pcp appointment- assisted by daughter        Mickel Crow. Lavina Hamman, RN, BSN, [place] Coordinator Office number [phone] Mobile number [phone]  Main [facility] number [phone] Fax number [phone]

## 2020-07-29 DIAGNOSIS — R2681 Unsteadiness on feet: Secondary | ICD-10-CM | POA: Diagnosis not present

## 2020-07-29 DIAGNOSIS — I6501 Occlusion and stenosis of right vertebral artery: Secondary | ICD-10-CM | POA: Diagnosis not present

## 2020-07-29 DIAGNOSIS — I69318 Other symptoms and signs involving cognitive functions following cerebral infarction: Secondary | ICD-10-CM | POA: Diagnosis not present

## 2020-07-29 DIAGNOSIS — I69312 Visuospatial deficit and spatial neglect following cerebral infarction: Secondary | ICD-10-CM | POA: Diagnosis not present

## 2020-07-29 DIAGNOSIS — I6522 Occlusion and stenosis of left carotid artery: Secondary | ICD-10-CM | POA: Diagnosis not present

## 2020-07-29 DIAGNOSIS — I69398 Other sequelae of cerebral infarction: Secondary | ICD-10-CM | POA: Diagnosis not present

## 2020-07-30 ENCOUNTER — Other Ambulatory Visit: Payer: Medicare Other

## 2020-07-30 DIAGNOSIS — I69398 Other sequelae of cerebral infarction: Secondary | ICD-10-CM | POA: Diagnosis not present

## 2020-07-30 DIAGNOSIS — I69312 Visuospatial deficit and spatial neglect following cerebral infarction: Secondary | ICD-10-CM | POA: Diagnosis not present

## 2020-07-30 DIAGNOSIS — I6501 Occlusion and stenosis of right vertebral artery: Secondary | ICD-10-CM | POA: Diagnosis not present

## 2020-07-30 DIAGNOSIS — I69318 Other symptoms and signs involving cognitive functions following cerebral infarction: Secondary | ICD-10-CM | POA: Diagnosis not present

## 2020-07-30 DIAGNOSIS — R2681 Unsteadiness on feet: Secondary | ICD-10-CM | POA: Diagnosis not present

## 2020-07-30 DIAGNOSIS — I6522 Occlusion and stenosis of left carotid artery: Secondary | ICD-10-CM | POA: Diagnosis not present

## 2020-08-03 ENCOUNTER — Other Ambulatory Visit: Payer: Self-pay

## 2020-08-03 ENCOUNTER — Encounter: Payer: Self-pay | Admitting: Rheumatology

## 2020-08-03 ENCOUNTER — Ambulatory Visit (INDEPENDENT_AMBULATORY_CARE_PROVIDER_SITE_OTHER): Payer: Medicare Other | Admitting: Rheumatology

## 2020-08-03 VITALS — BP 159/74 | HR 61 | Resp 16 | Ht 69.0 in | Wt 204.0 lb

## 2020-08-03 DIAGNOSIS — M3219 Other organ or system involvement in systemic lupus erythematosus: Secondary | ICD-10-CM | POA: Diagnosis not present

## 2020-08-03 DIAGNOSIS — Z8719 Personal history of other diseases of the digestive system: Secondary | ICD-10-CM

## 2020-08-03 DIAGNOSIS — R7612 Nonspecific reaction to cell mediated immunity measurement of gamma interferon antigen response without active tuberculosis: Secondary | ICD-10-CM | POA: Diagnosis not present

## 2020-08-03 DIAGNOSIS — I639 Cerebral infarction, unspecified: Secondary | ICD-10-CM

## 2020-08-03 DIAGNOSIS — Z86711 Personal history of pulmonary embolism: Secondary | ICD-10-CM

## 2020-08-03 DIAGNOSIS — Z9989 Dependence on other enabling machines and devices: Secondary | ICD-10-CM

## 2020-08-03 DIAGNOSIS — G4733 Obstructive sleep apnea (adult) (pediatric): Secondary | ICD-10-CM

## 2020-08-03 DIAGNOSIS — Z79899 Other long term (current) drug therapy: Secondary | ICD-10-CM | POA: Diagnosis not present

## 2020-08-03 DIAGNOSIS — Z8709 Personal history of other diseases of the respiratory system: Secondary | ICD-10-CM

## 2020-08-03 DIAGNOSIS — Z8669 Personal history of other diseases of the nervous system and sense organs: Secondary | ICD-10-CM

## 2020-08-03 DIAGNOSIS — I712 Thoracic aortic aneurysm, without rupture, unspecified: Secondary | ICD-10-CM

## 2020-08-03 DIAGNOSIS — I6389 Other cerebral infarction: Secondary | ICD-10-CM | POA: Diagnosis not present

## 2020-08-03 DIAGNOSIS — R808 Other proteinuria: Secondary | ICD-10-CM

## 2020-08-03 DIAGNOSIS — I10 Essential (primary) hypertension: Secondary | ICD-10-CM

## 2020-08-03 NOTE — Patient Instructions (Signed)
Vaccines You are taking a medication(s) that can suppress your immune system.  The following immunizations are recommended: . Flu annually . Covid-19  . Td/Tdap (tetanus, diphtheria, pertussis) every 10 years . Pneumonia (Prevnar 15 then Pneumovax 23 at least 1 year apart.  Alternatively, can take Prevnar 20 without needing additional dose) . Shingrix (after age 84): 2 doses from 4 weeks to 6 months apart  Please check with your PCP to make sure you are up to date.  Heart Disease Prevention   Your inflammatory disease increases your risk of heart disease which includes heart attack, stroke, atrial fibrillation (irregular heartbeats), high blood pressure, heart failure and atherosclerosis (plaque in the arteries).  It is important to reduce your risk by:   . Keep blood pressure, cholesterol, and blood sugar at healthy levels   . Smoking Cessation   . Maintain a healthy weight  o BMI 20-25   . Eat a healthy diet  o Plenty of fresh fruit, vegetables, and whole grains  o Limit saturated fats, foods high in sodium, and added sugars  o DASH and Mediterranean diet   . Increase physical activity  o Recommend moderate physically activity for 150 minutes per week/ 30 minutes a day for five days a week These can be broken up into three separate ten-minute sessions during the day.   . Reduce Stress  . Meditation, slow breathing exercises, yoga, coloring books  . Dental visits twice a year

## 2020-08-04 DIAGNOSIS — I69398 Other sequelae of cerebral infarction: Secondary | ICD-10-CM | POA: Diagnosis not present

## 2020-08-04 DIAGNOSIS — I6522 Occlusion and stenosis of left carotid artery: Secondary | ICD-10-CM | POA: Diagnosis not present

## 2020-08-04 DIAGNOSIS — I6501 Occlusion and stenosis of right vertebral artery: Secondary | ICD-10-CM | POA: Diagnosis not present

## 2020-08-04 DIAGNOSIS — I69318 Other symptoms and signs involving cognitive functions following cerebral infarction: Secondary | ICD-10-CM | POA: Diagnosis not present

## 2020-08-04 DIAGNOSIS — R2681 Unsteadiness on feet: Secondary | ICD-10-CM | POA: Diagnosis not present

## 2020-08-04 DIAGNOSIS — I69312 Visuospatial deficit and spatial neglect following cerebral infarction: Secondary | ICD-10-CM | POA: Diagnosis not present

## 2020-08-05 DIAGNOSIS — I6501 Occlusion and stenosis of right vertebral artery: Secondary | ICD-10-CM | POA: Diagnosis not present

## 2020-08-05 DIAGNOSIS — I69398 Other sequelae of cerebral infarction: Secondary | ICD-10-CM | POA: Diagnosis not present

## 2020-08-05 DIAGNOSIS — I6522 Occlusion and stenosis of left carotid artery: Secondary | ICD-10-CM | POA: Diagnosis not present

## 2020-08-05 DIAGNOSIS — I69312 Visuospatial deficit and spatial neglect following cerebral infarction: Secondary | ICD-10-CM | POA: Diagnosis not present

## 2020-08-05 DIAGNOSIS — I69318 Other symptoms and signs involving cognitive functions following cerebral infarction: Secondary | ICD-10-CM | POA: Diagnosis not present

## 2020-08-05 DIAGNOSIS — R2681 Unsteadiness on feet: Secondary | ICD-10-CM | POA: Diagnosis not present

## 2020-08-06 DIAGNOSIS — I69318 Other symptoms and signs involving cognitive functions following cerebral infarction: Secondary | ICD-10-CM | POA: Diagnosis not present

## 2020-08-06 DIAGNOSIS — I69398 Other sequelae of cerebral infarction: Secondary | ICD-10-CM | POA: Diagnosis not present

## 2020-08-06 DIAGNOSIS — R2681 Unsteadiness on feet: Secondary | ICD-10-CM | POA: Diagnosis not present

## 2020-08-06 DIAGNOSIS — I6522 Occlusion and stenosis of left carotid artery: Secondary | ICD-10-CM | POA: Diagnosis not present

## 2020-08-06 DIAGNOSIS — I69312 Visuospatial deficit and spatial neglect following cerebral infarction: Secondary | ICD-10-CM | POA: Diagnosis not present

## 2020-08-06 DIAGNOSIS — I6501 Occlusion and stenosis of right vertebral artery: Secondary | ICD-10-CM | POA: Diagnosis not present

## 2020-08-10 DIAGNOSIS — I6522 Occlusion and stenosis of left carotid artery: Secondary | ICD-10-CM | POA: Diagnosis not present

## 2020-08-10 DIAGNOSIS — I69398 Other sequelae of cerebral infarction: Secondary | ICD-10-CM | POA: Diagnosis not present

## 2020-08-10 DIAGNOSIS — I69318 Other symptoms and signs involving cognitive functions following cerebral infarction: Secondary | ICD-10-CM | POA: Diagnosis not present

## 2020-08-10 DIAGNOSIS — I6501 Occlusion and stenosis of right vertebral artery: Secondary | ICD-10-CM | POA: Diagnosis not present

## 2020-08-10 DIAGNOSIS — I69312 Visuospatial deficit and spatial neglect following cerebral infarction: Secondary | ICD-10-CM | POA: Diagnosis not present

## 2020-08-10 DIAGNOSIS — R2681 Unsteadiness on feet: Secondary | ICD-10-CM | POA: Diagnosis not present

## 2020-08-11 ENCOUNTER — Other Ambulatory Visit: Payer: Self-pay | Admitting: *Deleted

## 2020-08-11 DIAGNOSIS — I69398 Other sequelae of cerebral infarction: Secondary | ICD-10-CM | POA: Diagnosis not present

## 2020-08-11 DIAGNOSIS — I69318 Other symptoms and signs involving cognitive functions following cerebral infarction: Secondary | ICD-10-CM | POA: Diagnosis not present

## 2020-08-11 DIAGNOSIS — I6522 Occlusion and stenosis of left carotid artery: Secondary | ICD-10-CM | POA: Diagnosis not present

## 2020-08-11 DIAGNOSIS — R2681 Unsteadiness on feet: Secondary | ICD-10-CM | POA: Diagnosis not present

## 2020-08-11 DIAGNOSIS — I69312 Visuospatial deficit and spatial neglect following cerebral infarction: Secondary | ICD-10-CM | POA: Diagnosis not present

## 2020-08-11 DIAGNOSIS — I6501 Occlusion and stenosis of right vertebral artery: Secondary | ICD-10-CM | POA: Diagnosis not present

## 2020-08-11 NOTE — Patient Outreach (Signed)
[place] [facility]) Care Management  08/11/2020  VITTORIO MOHS December 30, 1934 [id number]   [facility] unsuccessful outreach for follow up on EMMI stroke referred patient  Mr RODERT HINCH was referred to [facility] on 07/27/20 for a RED ON EMMI ALERT Day #   13        Date: Sunday 07/25/20 1000 Red Alert Reason:Had follow up appointment? No and Scheduled a follow up appointment? No   Insurance: Medicare  [facility] admissions x 1  ED visits x 1 in the last 6 months Last admission 07/07/20 to 07/11/20 Acute CVA within the right posterior cerebral artery (PCA) territory- left visual field loss    [facility] Unsuccessful outreach   Outreach attempt to the home number  No answer. [facility] RN CM left HIPAA [facility] Portability and Accountability Act) compliant voicemail message along with CM's contact info.   Plan: [facility] RN CM scheduled this patient for another call attempt within 4-7 business days  Marykatherine Sherwood L. Lavina Hamman, RN, BSN, [place] Coordinator Office number [phone] Mobile number [phone]  Main [facility] number [phone] Fax number [phone]

## 2020-08-14 DIAGNOSIS — I69398 Other sequelae of cerebral infarction: Secondary | ICD-10-CM | POA: Diagnosis not present

## 2020-08-14 DIAGNOSIS — I088 Other rheumatic multiple valve diseases: Secondary | ICD-10-CM | POA: Diagnosis not present

## 2020-08-14 DIAGNOSIS — I69312 Visuospatial deficit and spatial neglect following cerebral infarction: Secondary | ICD-10-CM | POA: Diagnosis not present

## 2020-08-14 DIAGNOSIS — Z7982 Long term (current) use of aspirin: Secondary | ICD-10-CM | POA: Diagnosis not present

## 2020-08-14 DIAGNOSIS — M159 Polyosteoarthritis, unspecified: Secondary | ICD-10-CM | POA: Diagnosis not present

## 2020-08-14 DIAGNOSIS — D649 Anemia, unspecified: Secondary | ICD-10-CM | POA: Diagnosis not present

## 2020-08-14 DIAGNOSIS — Z7902 Long term (current) use of antithrombotics/antiplatelets: Secondary | ICD-10-CM | POA: Diagnosis not present

## 2020-08-14 DIAGNOSIS — Z6829 Body mass index (BMI) 29.0-29.9, adult: Secondary | ICD-10-CM | POA: Diagnosis not present

## 2020-08-14 DIAGNOSIS — Z7984 Long term (current) use of oral hypoglycemic drugs: Secondary | ICD-10-CM | POA: Diagnosis not present

## 2020-08-14 DIAGNOSIS — Z9181 History of falling: Secondary | ICD-10-CM | POA: Diagnosis not present

## 2020-08-14 DIAGNOSIS — E785 Hyperlipidemia, unspecified: Secondary | ICD-10-CM | POA: Diagnosis not present

## 2020-08-14 DIAGNOSIS — Z9981 Dependence on supplemental oxygen: Secondary | ICD-10-CM | POA: Diagnosis not present

## 2020-08-14 DIAGNOSIS — I700 Atherosclerosis of aorta: Secondary | ICD-10-CM | POA: Diagnosis not present

## 2020-08-14 DIAGNOSIS — R2681 Unsteadiness on feet: Secondary | ICD-10-CM | POA: Diagnosis not present

## 2020-08-14 DIAGNOSIS — G4730 Sleep apnea, unspecified: Secondary | ICD-10-CM | POA: Diagnosis not present

## 2020-08-14 DIAGNOSIS — J432 Centrilobular emphysema: Secondary | ICD-10-CM | POA: Diagnosis not present

## 2020-08-14 DIAGNOSIS — R109 Unspecified abdominal pain: Secondary | ICD-10-CM | POA: Diagnosis not present

## 2020-08-14 DIAGNOSIS — E119 Type 2 diabetes mellitus without complications: Secondary | ICD-10-CM | POA: Diagnosis not present

## 2020-08-14 DIAGNOSIS — M329 Systemic lupus erythematosus, unspecified: Secondary | ICD-10-CM | POA: Diagnosis not present

## 2020-08-14 DIAGNOSIS — I6501 Occlusion and stenosis of right vertebral artery: Secondary | ICD-10-CM | POA: Diagnosis not present

## 2020-08-14 DIAGNOSIS — I714 Abdominal aortic aneurysm, without rupture: Secondary | ICD-10-CM | POA: Diagnosis not present

## 2020-08-14 DIAGNOSIS — I69318 Other symptoms and signs involving cognitive functions following cerebral infarction: Secondary | ICD-10-CM | POA: Diagnosis not present

## 2020-08-14 DIAGNOSIS — I6522 Occlusion and stenosis of left carotid artery: Secondary | ICD-10-CM | POA: Diagnosis not present

## 2020-08-14 DIAGNOSIS — I119 Hypertensive heart disease without heart failure: Secondary | ICD-10-CM | POA: Diagnosis not present

## 2020-08-14 DIAGNOSIS — E669 Obesity, unspecified: Secondary | ICD-10-CM | POA: Diagnosis not present

## 2020-08-17 ENCOUNTER — Other Ambulatory Visit: Payer: Self-pay

## 2020-08-17 ENCOUNTER — Other Ambulatory Visit: Payer: Self-pay | Admitting: *Deleted

## 2020-08-17 NOTE — Patient Outreach (Addendum)
[place] [facility]) Care Management  08/17/2020  Tyler Norris 05-31-1934 [id number]   [facility] outreach for follow up on [place] stroke referred patient Tyler Tyler Norris was referred to [facility] on 07/27/20 for a RED ON EMMI ALERT Day #   13        Date: Sunday 07/25/20 1000 Red Alert Reason:Had follow up appointment? No and Scheduled a follow up appointment? No   Insurance: Medicare  [facility] admissions x 1  ED visits x 1 in the last 6 months Last admission 07/07/20 to 07/11/20 Acute CVA within the right posterior cerebral artery (PCA) territory- left visual field loss   Successful outreach to [phone] Patient is able to verify HIPAA ([place]) identifiers Reviewed and addressed the purpose of the follow up call with the patient  Consent: [facility] ([place]) RN CM reviewed [facility] services with patient. Patient gave verbal consent for services.  Follow up assessment Tyler Norris reports he is doing well He denies medical issues but needs clarity on the location of his upcoming cardiology visit This was reviewed with him as 09/01/20 0940 at [place] [facility] [place] He voiced understanding   Plans Patient agrees to care plan and follow up within the next 14 -21 business days Pt encouraged to return a call to [facility] RN CM prn  Goals Addressed               This Visit's Progress     Patient Stated     [facility]) Keep or Improve My Strength-Stroke (pt-stated)   On track     Timeframe:  Long-Range Goal Priority:  Medium Start Date:                 08/17/20            Expected End Date:      09/26/20                 Follow Up Date 09/01/20   - attend 22 percent of occupational therapy appointments - attend 90 percent of physical therapy appointments - increase activity or exercise time a little every week      Notes:  08/17/20 working with therapy and is regaining strength       COMPLETED: [facility]) Make and Keep  All Appointments (pt-stated)   On track     Timeframe:  Short-Term Goal Priority:  Low Start Date:                       07/28/20      Expected End Date:        08/26/20               Follow Up Date 08/25/20  Barriers: Knowledge  - ask family or friend for a ride - call to cancel if needed - keep a calendar with appointment dates     Notes: 08/17/20 goal completed -Tyler Norris reports he has completed visiting his medical providers except an upcoming cardiology visit - requested and received assist with the location of the 09/01/20 0940 appointment. Denied other medical or care coordination needs 08/11/20 unsuccessful outreach message left  07/28/20 EMMI stroke concerns with follow up appointment resolved as he has made a pcp appointment- assisted by daughter         Mickel Crow. Lavina Hamman, RN, BSN, [place] Coordinator Office number [phone] Main  [facility] number [phone] Fax number [phone]

## 2020-08-19 ENCOUNTER — Other Ambulatory Visit: Payer: Self-pay | Admitting: Medical

## 2020-08-19 DIAGNOSIS — I639 Cerebral infarction, unspecified: Secondary | ICD-10-CM

## 2020-08-19 DIAGNOSIS — I480 Paroxysmal atrial fibrillation: Secondary | ICD-10-CM

## 2020-08-20 DIAGNOSIS — Z6831 Body mass index (BMI) 31.0-31.9, adult: Secondary | ICD-10-CM | POA: Diagnosis not present

## 2020-08-20 DIAGNOSIS — I10 Essential (primary) hypertension: Secondary | ICD-10-CM | POA: Diagnosis not present

## 2020-08-20 DIAGNOSIS — E782 Mixed hyperlipidemia: Secondary | ICD-10-CM | POA: Diagnosis not present

## 2020-08-20 DIAGNOSIS — Z7984 Long term (current) use of oral hypoglycemic drugs: Secondary | ICD-10-CM | POA: Diagnosis not present

## 2020-08-20 DIAGNOSIS — M3210 Systemic lupus erythematosus, organ or system involvement unspecified: Secondary | ICD-10-CM | POA: Diagnosis not present

## 2020-08-20 DIAGNOSIS — I639 Cerebral infarction, unspecified: Secondary | ICD-10-CM | POA: Diagnosis not present

## 2020-08-20 DIAGNOSIS — E6609 Other obesity due to excess calories: Secondary | ICD-10-CM | POA: Diagnosis not present

## 2020-08-20 DIAGNOSIS — E119 Type 2 diabetes mellitus without complications: Secondary | ICD-10-CM | POA: Diagnosis not present

## 2020-08-20 DIAGNOSIS — G4733 Obstructive sleep apnea (adult) (pediatric): Secondary | ICD-10-CM | POA: Diagnosis not present

## 2020-08-23 ENCOUNTER — Telehealth: Payer: Self-pay | Admitting: *Deleted

## 2020-08-23 NOTE — Telephone Encounter (Signed)
I spoke to the patient and provided him with the cardiac event monitoring results.

## 2020-08-23 NOTE — Telephone Encounter (Signed)
-----   Message from Garvin Fila, MD sent at 08/23/2020  5:26 PM EDT ----- Mitchell Heir inform the patient that heart monitoring study did not reveal any evidence of atrial fibrillation or any worrisome arrhythmia.

## 2020-08-23 NOTE — Progress Notes (Signed)
Kindly inform the patient that heart monitoring study did not reveal any evidence of atrial fibrillation or any worrisome arrhythmia.

## 2020-08-24 DIAGNOSIS — H5310 Unspecified subjective visual disturbances: Secondary | ICD-10-CM | POA: Diagnosis not present

## 2020-08-24 DIAGNOSIS — H5340 Unspecified visual field defects: Secondary | ICD-10-CM | POA: Diagnosis not present

## 2020-08-25 DIAGNOSIS — R2681 Unsteadiness on feet: Secondary | ICD-10-CM | POA: Diagnosis not present

## 2020-08-25 DIAGNOSIS — I69398 Other sequelae of cerebral infarction: Secondary | ICD-10-CM | POA: Diagnosis not present

## 2020-08-25 DIAGNOSIS — I6501 Occlusion and stenosis of right vertebral artery: Secondary | ICD-10-CM | POA: Diagnosis not present

## 2020-08-25 DIAGNOSIS — I6522 Occlusion and stenosis of left carotid artery: Secondary | ICD-10-CM | POA: Diagnosis not present

## 2020-08-25 DIAGNOSIS — I69312 Visuospatial deficit and spatial neglect following cerebral infarction: Secondary | ICD-10-CM | POA: Diagnosis not present

## 2020-08-25 DIAGNOSIS — I69318 Other symptoms and signs involving cognitive functions following cerebral infarction: Secondary | ICD-10-CM | POA: Diagnosis not present

## 2020-08-31 NOTE — Progress Notes (Signed)
Cardiology Office Note:    Date:  09/01/2020   ID:  Tyler Norris, DOB Jun 29, 1934, MRN [id number]  PCP:  Tyler Dials, MD   [facility] Providers Cardiologist:  Tyler Lean, MD     Referring MD: Tyler Dials, MD   CC: Told that he had to come here Consulted for the evaluation of care post stroke at the behest of Tyler Dials, MD Former Tyler Norris patient  History of Present Illness:    Tyler Norris is a 86 y.o. male with a hx of pericardial effusion with query of increase pressures in 2016, HTN and DM, OSA, HLD CAC and Aortic Atherosclerosis, Lupus TAA (mild in 2016) and AAA (vs ULN in 2013).  Presents for evaluation 09/01/20.  Patient notes that he is doing well.  Patient had R [place] territory stroke 07/10/2020 (had unilateral vision loss). TAA and Effusion concerns had resolved on 06/2020 echo.  No chest pain or pressure .  No SOB/DOE and no PND/Orthopnea.  No weight gain or leg swelling.  No palpitations or syncope.  Most activist thing is going to the gym and goes one hour to hour and fifteen minutes.  Does weights (light and high weight) Only rarely has DOE (going up an incline at the gym since coming back from the stroke).   Ambulatory blood pressure not done.   Past Medical History:  Diagnosis Date   AAA (abdominal aortic aneurysm) ([place])    a. resolved by ct scan 09-2011   Anemia    Arthritis    Barrett's esophagus    Carpal tunnel syndrome, bilateral    Essential hypertension    Fibromyalgia    GERD (gastroesophageal reflux disease)    History of pneumonia    Hyperlipidemia    Lupus (systemic lupus erythematosus) (HCC)    Nephrolithiasis    Pericardial effusion    a. 08/22/2014 Echo: EF 60-65%, mildly dil Ao root (37m), mildly dil LA, mod-large partially organized pericardial effusion with possible early tamponade - asymptomatic on high dose nsaids-->conservative mgmt;  b. 08/2014 Limited Echo: EF 55-60%, Triv - small effusion.   Sleep  apnea    a. uses cpap every night    Past Surgical History:  Procedure Laterality Date   APPENDECTOMY     CARPAL TUNNEL RELEASE  10/12/2011   Procedure: CARPAL TUNNEL RELEASE;  Surgeon: RCammie Sickle, MD;  [place] [facility] [place]  Service: Orthopedics;  Laterality: Left;   CARPAL TUNNEL RELEASE  12/01/2011   Procedure: CARPAL TUNNEL RELEASE;  Surgeon: RCammie Sickle, MD;  [place] [facility]  Service: Orthopedics;  Laterality: Right;   CYSTOSCOPY W/ STONE MANIPULATION  2001,2009   HAND SURGERY     scar repaired lt hand   HERNIA REPAIR  1966   IR ANGIO INTRA EXTRACRAN SEL INTERNAL CAROTID BILAT MOD SED  07/09/2020   IR ANGIO VERTEBRAL SEL SUBCLAVIAN INNOMINATE UNI R MOD SED  07/09/2020   IR ANGIO VERTEBRAL SEL VERTEBRAL UNI L MOD SED  07/09/2020   IR [place] VASC ACCESS RIGHT  07/09/2020   KNEE ARTHROPLASTY     KNEE ARTHROSCOPY Left    TONSILLECTOMY      Current Medications: Current Meds  Medication Sig   acetaminophen (TYLENOL) 500 MG tablet Take 500 mg by mouth every 8 (eight) hours as needed for mild pain.   acyclovir (ZOVIRAX) 400 MG tablet Take 400 mg by mouth 2 (two) times daily.   amLODipine (NORVASC) 5 MG  tablet Take 1 tablet (5 mg total) daily by mouth.   aspirin 81 MG tablet Take 81 mg by mouth daily.   Bioflavonoid Products (BIOFLEX PO) Take 1 tablet by mouth 2 (two) times daily.   calcium-vitamin D (OSCAL WITH D) 500-200 MG-UNIT tablet Take 1 tablet by mouth daily with breakfast.   fenofibrate (TRICOR) 145 MG tablet Take 145 mg by mouth daily.   ferrous sulfate 325 (65 FE) MG tablet Take 325 mg by mouth as needed.   folic acid (FOLVITE) 169 MCG tablet Take 800 mcg by mouth daily.   hydroxychloroquine (PLAQUENIL) 200 MG tablet Take one tablet by mouth twice daily Monday-Friday only.   magnesium oxide (MAG-OX) 400 (241.3 MG) MG tablet Take 1 tablet (400 mg total) by mouth 3 (three) times daily.   metFORMIN (GLUCOPHAGE) 500 MG tablet  Take 500 mg by mouth daily.   Multiple Vitamin (MULTIVITAMIN WITH MINERALS) TABS Take 1 tablet by mouth daily.   Omega-3 Fatty Acids (FISH OIL) 1200 MG CAPS Take 1,200 mg by mouth daily.    pantoprazole (PROTONIX) 40 MG tablet Take 40 mg by mouth 2 (two) times daily.   rosuvastatin (CRESTOR) 5 MG tablet Take 5 mg by mouth every other day.   tamsulosin (FLOMAX) 0.4 MG CAPS capsule Take 0.4 mg by mouth daily.   telmisartan-hydrochlorothiazide (MICARDIS HCT) 80-12.5 MG tablet Take 1 tablet by mouth daily.   vitamin C (ASCORBIC ACID) 500 MG tablet Take 500 mg by mouth daily.   vitamin E 400 UNIT capsule Take 400 Units by mouth daily.   [DISCONTINUED] metoprolol tartrate (LOPRESSOR) 25 MG tablet Take 25 mg by mouth every other day.     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  Tobacco Use   Smoking status: Never   Smokeless tobacco: Never  Vaping Use   Vaping Use: Never used  Substance and Sexual Activity   Alcohol use: Yes    Comment: once every 6 months   Drug use: Never   Sexual activity: Not on file  Other Topics Concern   Not on file  Social History Narrative   Not on file   Social Determinants of Health   Financial Resource Strain: Low Risk    Difficulty of Paying Living Expenses: Not hard at all  Food Insecurity: No Food Insecurity   Worried About Charity fundraiser in the Last Year: Never true   [place] in the Last Year: Never true  Transportation Needs: No Transportation Needs   Lack of Transportation (Medical): No   Lack of Transportation (Non-Medical): No  Physical Activity: Not on file  Stress: No Stress Concern Present   Feeling of Stress :  Only a little  Social Connections: Engineer, building services of Communication with Friends and Family: More than three times a week   Frequency of Social Gatherings with Friends and Family: More than three times a week   Attends Religious Services: 1 to 4 times per year   Active Member of [employer] or Organizations: Yes   Attends Archivist Meetings: 1 to 4 times per year   Marital Status:  Married    Social: has [place] Friends  Family History: The patient's family history includes Diabetes in his mother and sister; Healthy in his daughter and son; Stroke in his sister.  ROS:   Please see the history of present illness.     All other systems reviewed and are negative.  EKGs/Labs/Other Studies Reviewed:    The following studies were reviewed today:  EKG:   5/13//22: SR 71 occasional PVC  Cardiac Event Monitoring: Date: 08/19/20 Results: Patient had a minimum heart rate of 38 bpm, maximum heart rate of 110 bpm, and average heart rate of 63 bpm. Predominant underlying rhythm was sinus rhythm. Isolated PACs were occasional (2.0%). Isolated PVCs were occasional (1.0%). No evidence of complete heart block or atrial fibrillation. Triggered and diary events associated with sinus arrhythmia.   No malignant arrhythmias.    Transthoracic Echocardiogram: Date: 07/09/2020 Results: no recurrent effusion 1. Left ventricular ejection fraction, by estimation, is 55 to 60%. The  left ventricle has normal function. The left ventricle has no regional  wall motion abnormalities. There is mild left ventricular hypertrophy.  Left ventricular diastolic parameters  are consistent with Grade I diastolic dysfunction (impaired relaxation).   2. Right ventricular systolic function is normal. The right ventricular  size is normal. There is normal pulmonary artery systolic pressure.   3. Left atrial size was moderately dilated.   4. Right atrial size was moderately dilated.   5. The  mitral valve is normal in structure. Mild mitral valve  regurgitation. No evidence of mitral stenosis.   6. The aortic valve is tricuspid. There is mild calcification of the  aortic valve. Aortic valve regurgitation is not visualized. No aortic  stenosis is present.   7. The inferior vena cava is dilated in size with >50% respiratory  variability, suggesting right atrial pressure of 8 mmHg.   NonCardiac CT : Date: 05/28/2015 Results: AA ULN Aortic Atherosclerosis CAC   Recent Labs: 07/07/2020: ALT 15 07/10/2020: BUN 18; Creatinine, Ser 0.98; Hemoglobin 12.1; Platelets 177; Potassium 3.5; Sodium 136  Recent Lipid Panel    Component Value Date/Time   CHOL 147 07/08/2020 0307   TRIG 234 (H) 07/08/2020 0307   HDL 33 (L) 07/08/2020 0307   CHOLHDL 4.5 07/08/2020 0307   VLDL 47 (H) 07/08/2020 0307   LDLCALC 67 07/08/2020 0307    Risk Assessment/Calculations:     NA      Physical Exam:    VS:  BP 140/68   Pulse (!) 56   Ht 5' 9"  (1.753 m)   Wt 89.8 kg   SpO2 98%   BMI 29.24 kg/m     Wt Readings from Last 3 Encounters:  09/01/20 89.8 kg  08/03/20 92.5 kg  03/04/20 97.1 kg     GEN:  Well nourished, well developed in no acute distress HEENT: Bilateral Frank's Sign NECK: No JVD; No carotid bruits LYMPHATICS: No lymphadenopathy CARDIAC: RRR, no murmurs, rubs, gallops RESPIRATORY:  Clear to auscultation without rales, wheezing or rhonchi  ABDOMEN: Soft, non-tender, non-distended MUSCULOSKELETAL:  No edema; No deformity  SKIN: Warm and dry NEUROLOGIC:  Alert and oriented x 3 PSYCHIATRIC:  Normal affect   ASSESSMENT:    1. History of recent stroke   2. Aortic atherosclerosis ([place])   3. Systemic lupus erythematosus, unspecified SLE type, unspecified organ involvement status ([place])   4. Hypertension associated with diabetes ([place])   5. Thoracic aortic aneurysm without rupture ([place])    PLAN:    Recent Stroke -  history of SDH without limitations on ASA at this  time HLD, CAC, and Aortic Atherosclerosis with atorvastatin allergies HTN & DM and OSA on CPAP Lupus  TAA (mild in 2016 resolved in 2022)  AAA (vs ULN in 2013) - LDL goal < 70 met - Continue ASA 81 mg PO Daily - continue rosuvastatin 5 mg (on fenofibrate) - continue telmisartan and HCTZ 80/12.5; may need to add HCTZ 12.5 if BP elevates - continue amlodipine 5 mg PO  - amb BP 120-130s - discussed supplements and Vitamin C - discussed that if he has CP, SOB, or no improvement in his incline related DOE we will pursue [place] Stress Testing given his CAC (will reach out to neurologist about [facility] if that is the case in case of positive test)   Patient has concerns about if he needs flomax; will CC note to [facility] MD for guidance      Medication Adjustments/Labs and Tests Ordered: Current medicines are reviewed at length with the patient today.  Concerns regarding medicines are outlined above.  No orders of the defined types were placed in this encounter.  No orders of the defined types were placed in this encounter.   Patient Instructions  Medication Instructions:  Your physician has recommended you make the following change in your medication:  STOP: metoprolol  *If you need a refill on your cardiac medications before your next appointment, please call your pharmacy*   Lab Work: NONE If you have labs (blood work) drawn today and your tests are completely normal, you will receive your results only by: [place] (if you have MyChart) OR A paper copy in the mail If you have any lab test that is abnormal or we need to change your treatment, we will call you to review the results.   Testing/Procedures: NONE   Follow-Up: At [facility], you and your health needs are our priority.  As part of our continuing mission to provide you with exceptional heart care, we have created designated Provider Care Teams.  These Care Teams include your primary Cardiologist (physician) and Advanced  Practice Providers (APPs -  Physician Assistants and Nurse Practitioners) who all work together to provide you with the care you need, when you need it.  We recommend signing up for the patient portal called "MyChart".  Sign up information is provided on this After Visit Summary.  MyChart is used to connect with patients for Virtual Visits (Telemedicine).  Patients are able to view lab/test results, encounter notes, upcoming appointments, etc.  Non-urgent messages can be sent to your provider as well.   To learn more about what you can do with MyChart, go to [email or web address].    Your next appointment:   3-4 month(s)  The format for your next appointment:   In Person  Provider:   You may see Tyler Lean, MD or one of the following Advanced Practice Providers on your designated Care Team:   Melina Copa, PA-C Ermalinda Barrios, PA-C       Signed, Tyler Lean, MD  09/01/2020 10:22 AM    [place]

## 2020-09-01 ENCOUNTER — Other Ambulatory Visit: Payer: Self-pay | Admitting: *Deleted

## 2020-09-01 ENCOUNTER — Encounter: Payer: Self-pay | Admitting: Internal Medicine

## 2020-09-01 ENCOUNTER — Ambulatory Visit (INDEPENDENT_AMBULATORY_CARE_PROVIDER_SITE_OTHER): Payer: Medicare Other | Admitting: Internal Medicine

## 2020-09-01 ENCOUNTER — Encounter: Payer: Self-pay | Admitting: *Deleted

## 2020-09-01 ENCOUNTER — Other Ambulatory Visit: Payer: Self-pay

## 2020-09-01 VITALS — BP 140/68 | HR 56 | Ht 69.0 in | Wt 198.0 lb

## 2020-09-01 DIAGNOSIS — I700 Atherosclerosis of aorta: Secondary | ICD-10-CM

## 2020-09-01 DIAGNOSIS — Z8673 Personal history of transient ischemic attack (TIA), and cerebral infarction without residual deficits: Secondary | ICD-10-CM | POA: Diagnosis not present

## 2020-09-01 DIAGNOSIS — I152 Hypertension secondary to endocrine disorders: Secondary | ICD-10-CM

## 2020-09-01 DIAGNOSIS — E1159 Type 2 diabetes mellitus with other circulatory complications: Secondary | ICD-10-CM | POA: Diagnosis not present

## 2020-09-01 DIAGNOSIS — M329 Systemic lupus erythematosus, unspecified: Secondary | ICD-10-CM | POA: Diagnosis not present

## 2020-09-01 DIAGNOSIS — I712 Thoracic aortic aneurysm, without rupture, unspecified: Secondary | ICD-10-CM

## 2020-09-01 NOTE — Patient Instructions (Signed)
Medication Instructions:  Your physician has recommended you make the following change in your medication:  STOP: metoprolol  *If you need a refill on your cardiac medications before your next appointment, please call your pharmacy*   Lab Work: NONE If you have labs (blood work) drawn today and your tests are completely normal, you will receive your results only by: [place] (if you have MyChart) OR A paper copy in the mail If you have any lab test that is abnormal or we need to change your treatment, we will call you to review the results.   Testing/Procedures: NONE   Follow-Up: At [facility], you and your health needs are our priority.  As part of our continuing mission to provide you with exceptional heart care, we have created designated Provider Care Teams.  These Care Teams include your primary Cardiologist (physician) and Advanced Practice Providers (APPs -  Physician Assistants and Nurse Practitioners) who all work together to provide you with the care you need, when you need it.  We recommend signing up for the patient portal called "MyChart".  Sign up information is provided on this After Visit Summary.  MyChart is used to connect with patients for Virtual Visits (Telemedicine).  Patients are able to view lab/test results, encounter notes, upcoming appointments, etc.  Non-urgent messages can be sent to your provider as well.   To learn more about what you can do with MyChart, go to [email or web address].    Your next appointment:   3-4 month(s)  The format for your next appointment:   In Person  Provider:   You may see [name], MD or one of the following Advanced Practice Providers on your designated Care Team:   Melina Copa, PA-C Ermalinda Barrios, PA-C

## 2020-09-01 NOTE — Patient Outreach (Signed)
[facility]) Care Management  09/01/2020  VICTORINO FATZINGER 01-28-1935 [id number]  [facility] outreach for follow up on [place] stroke referred patient with case closure Mr SHAAN RHOADS was referred to [facility] on 07/27/20 for a RED ON EMMI ALERT Day #   13        Date: Sunday 07/25/20 1000 Red Alert Reason:Had follow up appointment? No and Scheduled a follow up appointment? No   Insurance: Medicare  [facility] admissions x 1  ED visits x 1 in the last 6 months Last admission 07/07/20 to 07/11/20 Acute CVA within the right posterior cerebral artery (PCA) territory- left visual field loss   Successful outreach to [phone] Patient is able to verify HIPAA ([place] and [place]) identifiers Reviewed and addressed the purpose of the follow up call with the patient   Consent: [facility] RN CM reviewed [facility] services with patient. Patient gave verbal consent for services.   Follow up assessment Mr OVA MEEGAN reports he is doing well with much improvement since hospital discharge  He reports increase in activity and exercise Home health services completed He informed his cardiology staff today " going to the gym and goes one hour to hour and fifteen minutes.  Does weights (light and high weight)" Only rarely has DOE (going up an incline at the gym since coming back from the stroke). He reports he has seen all his MDs A few weeks ago seen by primary care provider (PCP) and neurologist He denies any medical, social determinants of health (SDOH), care coordination or disease management needs with assessment  He frequently informs [facility] RN CM that "all is okay here" THN progression of services discussed  Mr Covelli states he would prefer case closure as no identifiable needs  Plans [facility] case closure with letters to pt/pcp Pt encouraged to return a call to [facility] RN CM prn  Goals Addressed               This Visit's Progress      Patient Stated     COMPLETED: [facility]) Keep or Improve My Strength-Stroke (pt-stated)   On track     Timeframe:  Long-Range Goal Priority:  Medium Start Date:                 08/17/20            Expected End Date:      09/26/20                 Case closure Date 09/01/20   - attend 20 percent of occupational therapy appointments - attend 81 percent of physical therapy appointments - eat healthy to increase strength - increase activity or exercise time a little every week   Notes:  09/01/20 Goal met Reports he is doing well with much improvement since hospital discharge  He reports increase in activity and exercise Going to Gym- weights walking only noted Dyspnea on exertion going up hill All home health services per pt completed 08/17/20 working with therapy and is regaining strength          Aneka Fagerstrom L. Lavina Hamman, RN, BSN, [place] Coordinator Office number [phone] [facility] number [phone] Fax number [phone]

## 2020-09-05 DIAGNOSIS — Z20822 Contact with and (suspected) exposure to covid-19: Secondary | ICD-10-CM | POA: Diagnosis not present

## 2020-09-23 ENCOUNTER — Other Ambulatory Visit: Payer: Self-pay

## 2020-09-23 MED ORDER — HYDROXYCHLOROQUINE SULFATE 200 MG PO TABS: ORAL_TABLET | ORAL | 0 refills | Status: DC

## 2020-09-23 NOTE — Telephone Encounter (Signed)
Spoke with patient and he states he is taking PLQ po twice daily Monday- Friday

## 2020-09-23 NOTE — Telephone Encounter (Signed)
Please clarify if the patient is taking PLQ BID or BID M-F

## 2020-09-23 NOTE — Telephone Encounter (Signed)
Patient left a voicemail requesting a return call to discuss one of his medications to see if it is still on his records and to request a refill.

## 2020-09-23 NOTE — Telephone Encounter (Signed)
Patient is requesting a refill on PLQ to be sent to [facility].  Last Visit: 08/03/2020  Next Visit: 01/04/2021  Labs: 07/10/2020 RBC 3.94, Hgb 12.1, Hct 35.6, Glucose 125, CO2 21  Eye exam:  02/02/2020 WNL   Current Dose per office note 08/03/2020: Plaquenil 200 mg twice daily  DX:: Other systemic lupus erythematosus with other organ involvement  Last Fill: 06/22/2020  Okay to refill Plaquenil?

## 2020-10-02 DIAGNOSIS — U071 COVID-19: Secondary | ICD-10-CM | POA: Diagnosis not present

## 2020-10-02 DIAGNOSIS — Z20822 Contact with and (suspected) exposure to covid-19: Secondary | ICD-10-CM | POA: Diagnosis not present

## 2020-10-06 DIAGNOSIS — Z20822 Contact with and (suspected) exposure to covid-19: Secondary | ICD-10-CM | POA: Diagnosis not present

## 2020-10-06 DIAGNOSIS — U071 COVID-19: Secondary | ICD-10-CM | POA: Diagnosis not present

## 2020-10-14 MED ORDER — HYDROXYCHLOROQUINE SULFATE 200 MG PO TABS: ORAL_TABLET | ORAL | 0 refills | Status: DC

## 2020-10-14 NOTE — Addendum Note (Signed)
Addended by: Carole Binning on: 10/14/2020 03:06 PM   Modules accepted: Orders

## 2020-10-22 DIAGNOSIS — E782 Mixed hyperlipidemia: Secondary | ICD-10-CM | POA: Diagnosis not present

## 2020-10-22 DIAGNOSIS — E119 Type 2 diabetes mellitus without complications: Secondary | ICD-10-CM | POA: Diagnosis not present

## 2020-10-22 DIAGNOSIS — Z7984 Long term (current) use of oral hypoglycemic drugs: Secondary | ICD-10-CM | POA: Diagnosis not present

## 2020-10-22 DIAGNOSIS — I639 Cerebral infarction, unspecified: Secondary | ICD-10-CM | POA: Diagnosis not present

## 2020-10-27 DIAGNOSIS — H5340 Unspecified visual field defects: Secondary | ICD-10-CM | POA: Diagnosis not present

## 2020-11-19 DIAGNOSIS — E782 Mixed hyperlipidemia: Secondary | ICD-10-CM | POA: Diagnosis not present

## 2020-11-19 DIAGNOSIS — E119 Type 2 diabetes mellitus without complications: Secondary | ICD-10-CM | POA: Diagnosis not present

## 2020-11-19 DIAGNOSIS — Z7984 Long term (current) use of oral hypoglycemic drugs: Secondary | ICD-10-CM | POA: Diagnosis not present

## 2020-11-19 DIAGNOSIS — M25562 Pain in left knee: Secondary | ICD-10-CM | POA: Diagnosis not present

## 2020-11-19 DIAGNOSIS — M25561 Pain in right knee: Secondary | ICD-10-CM | POA: Diagnosis not present

## 2020-11-19 DIAGNOSIS — I2510 Atherosclerotic heart disease of native coronary artery without angina pectoris: Secondary | ICD-10-CM | POA: Diagnosis not present

## 2020-11-19 DIAGNOSIS — I10 Essential (primary) hypertension: Secondary | ICD-10-CM | POA: Diagnosis not present

## 2020-11-19 DIAGNOSIS — D509 Iron deficiency anemia, unspecified: Secondary | ICD-10-CM | POA: Diagnosis not present

## 2020-11-23 ENCOUNTER — Other Ambulatory Visit: Payer: Self-pay | Admitting: Family Medicine

## 2020-11-23 ENCOUNTER — Ambulatory Visit
Admission: RE | Admit: 2020-11-23 | Discharge: 2020-11-23 | Disposition: A | Discharge status: Home / Self Care | Payer: Medicare Other | Source: Ambulatory Visit | Attending: Family Medicine | Admitting: Family Medicine

## 2020-11-23 ENCOUNTER — Other Ambulatory Visit: Payer: Self-pay

## 2020-11-23 DIAGNOSIS — M19012 Primary osteoarthritis, left shoulder: Secondary | ICD-10-CM | POA: Diagnosis not present

## 2020-11-23 DIAGNOSIS — M89319 Hypertrophy of bone, unspecified shoulder: Secondary | ICD-10-CM

## 2020-12-21 DIAGNOSIS — L819 Disorder of pigmentation, unspecified: Secondary | ICD-10-CM | POA: Diagnosis not present

## 2020-12-21 DIAGNOSIS — I10 Essential (primary) hypertension: Secondary | ICD-10-CM | POA: Diagnosis not present

## 2020-12-21 NOTE — Progress Notes (Signed)
Office Visit Note  Patient: Tyler Norris             Date of Birth: 01/03/1935           MRN: [id number]             PCP: [name], MD Referring: [name], MD Visit Date: 01/04/2021 Occupation: @GUAROCC @  Subjective:  Medication management   History of Present Illness: Tyler Norris is a 86 y.o. male with a history of systemic lupus).  He states he is gradually recovering from the stroke.  He still has loss of peripheral vision in his left eye.  He has some difficulty at times finding words.  He has been active and building things in his house.  He also is involved in gardening.  He denies any history of oral ulcers, nasal ulcers, malar rash, photosensitivity, sicca symptoms or Raynaud's phenomenon.  He has been tolerating hydroxychloroquine without any problems.  Activities of Daily Living:  Patient reports morning stiffness for  0 minutes.   Patient Reports nocturnal pain.  Difficulty dressing/grooming: Denies Difficulty climbing stairs: Denies Difficulty getting out of chair: Denies Difficulty using hands for taps, buttons, cutlery, and/or writing: Denies  Review of Systems  Constitutional:  Positive for fatigue.  HENT:  Positive for mouth dryness. Negative for mouth sores and nose dryness.   Eyes:  Negative for pain, itching and dryness.  Respiratory:  Negative for shortness of breath and difficulty breathing.   Cardiovascular:  Positive for swelling in legs/feet. Negative for chest pain and palpitations.  Gastrointestinal:  Negative for blood in stool, constipation and diarrhea.  Endocrine: Positive for increased urination.  Genitourinary:  Negative for difficulty urinating.  Musculoskeletal:  Positive for joint pain and joint pain. Negative for joint swelling, myalgias, morning stiffness, muscle tenderness and myalgias.  Skin:  Negative for color change, rash and redness.  Allergic/Immunologic: Negative for susceptible to infections.  Neurological:   Positive for memory loss and weakness. Negative for dizziness, numbness and headaches.  Hematological:  Positive for bruising/bleeding tendency.  Psychiatric/Behavioral:  Negative for confusion.    PMFS History:  Patient Active Problem List   Diagnosis Date Noted   History of stroke 09/01/2020   Aortic atherosclerosis ([place]) 09/01/2020   CVA (cerebral vascular accident) ([place]) 07/07/2020   Pain in joint of right ankle 03/27/2017   SDH (subdural hematoma) 01/08/2017   Dysphonia 11/15/2015   Pleural effusion on left 06/02/2015   Aneurysm of thoracic aorta 06/02/2015   SOB (shortness of breath) 08/22/2014   Lupus (systemic lupus erythematosus) ([place])    Positive QuantiFERON-TB Gold test 07/05/2012   PE (pulmonary embolism) 06/05/2012   Pleural effusion exudative 05/26/2012   Hypertension associated with diabetes ([place]) 05/13/2012   OSA on CPAP 05/13/2012   AAA (abdominal aortic aneurysm) without rupture 06/27/2011    Past Medical History:  Diagnosis Date   AAA (abdominal aortic aneurysm)    a. resolved by ct scan 09-2011   Anemia    Arthritis    Barrett's esophagus    Carpal tunnel syndrome, bilateral    Essential hypertension    Fibromyalgia    GERD (gastroesophageal reflux disease)    History of pneumonia    Hyperlipidemia    Lupus (systemic lupus erythematosus) (HCC)    Nephrolithiasis    Pericardial effusion    a. 08/22/2014 Echo: EF 60-65%, mildly dil Ao root (60mm), mildly dil LA, mod-large partially organized pericardial effusion with possible early tamponade - asymptomatic on high dose nsaids-->conservative  mgmt;  b. 08/2014 Limited Echo: EF 55-60%, Triv - small effusion.   Sleep apnea    a. uses cpap every night    Family History  Problem Relation Age of Onset   Diabetes Mother    Diabetes Sister    Stroke Sister    Healthy Daughter    Healthy Son    Past Surgical History:  Procedure Laterality Date   APPENDECTOMY     CARPAL TUNNEL RELEASE  10/12/2011    Procedure: CARPAL TUNNEL RELEASE;  Surgeon: Cammie Sickle., MD;  Location: [facility];  Service: Orthopedics;  Laterality: Left;   CARPAL TUNNEL RELEASE  12/01/2011   Procedure: CARPAL TUNNEL RELEASE;  Surgeon: Cammie Sickle., MD;  Location: [facility];  Service: Orthopedics;  Laterality: Right;   CYSTOSCOPY W/ STONE MANIPULATION  2001,2009   HAND SURGERY     scar repaired lt hand   HERNIA REPAIR  1966   IR ANGIO INTRA EXTRACRAN SEL INTERNAL CAROTID BILAT MOD SED  07/09/2020   IR ANGIO VERTEBRAL SEL SUBCLAVIAN INNOMINATE UNI R MOD SED  07/09/2020   IR ANGIO VERTEBRAL SEL VERTEBRAL UNI L MOD SED  07/09/2020   IR US GUIDE VASC ACCESS RIGHT  07/09/2020   KNEE ARTHROPLASTY     KNEE ARTHROSCOPY Left    TONSILLECTOMY     Social History   Social History Narrative   Not on file   Immunization History  Administered Date(s) Administered   Fluad Quad(high Dose 65+) 01/16/2019, 10/28/2019   Influenza Nasal 11/28/2011   Influenza Split 11/27/2013, 11/28/2014   Influenza,inj,Quad PF,6+ Mos 11/11/2012, 10/29/2015   Influenza-Unspecified 11/16/2014   Moderna Sars-Covid-2 Vaccination 03/12/2019, 04/08/2019, 10/27/2019, 06/21/2020   Pneumococcal Polysaccharide-23 05/15/2012   Tdap 01/07/2017   Zoster Recombinat (Shingrix) 01/16/2018, 03/17/2018     Objective: Vital Signs: BP (!) 154/71 (BP Location: Left Arm, Patient Position: Sitting, Cuff Size: Normal)   Pulse 61   Ht 5' 9.5" (1.765 m)   Wt 209 lb (94.8 kg)   BMI 30.42 kg/m    Physical Exam Vitals and nursing note reviewed.  Constitutional:      Appearance: He is well-developed.  HENT:     Head: Normocephalic and atraumatic.  Eyes:     Conjunctiva/sclera: Conjunctivae normal.     Pupils: Pupils are equal, round, and reactive to light.  Cardiovascular:     Rate and Rhythm: Normal rate and regular rhythm.     Heart sounds: Normal heart sounds.  Pulmonary:     Effort: Pulmonary effort is normal.      Breath sounds: Normal breath sounds.  Abdominal:     General: Bowel sounds are normal.     Palpations: Abdomen is soft.  Musculoskeletal:     Cervical back: Normal range of motion and neck supple.     Right lower leg: Edema present.     Left lower leg: Edema present.  Skin:    General: Skin is warm and dry.     Capillary Refill: Capillary refill takes less than 2 seconds.  Neurological:     Mental Status: He is alert and oriented to person, place, and time.  Psychiatric:        Behavior: Behavior normal.     Musculoskeletal Exam: C-spine was in good range of motion.  Shoulder joints, elbow joints, wrist joints, MCPs PIPs and DIPs with good range of motion with no synovitis.  He had bilateral PIP and DIP thickening.  Hip joints, knee  joints, ankles, MTPs and PIPs with good range of motion with no synovitis.  He had bilateral lower extremity edema.  CDAI Exam: CDAI Score: -- Patient Global: --; Provider Global: -- Swollen: --; Tender: -- Joint Exam 01/04/2021   No joint exam has been documented for this visit   There is currently no information documented on the homunculus. Go to the Rheumatology activity and complete the homunculus joint exam.  Investigation: No additional findings.  Imaging: No results found.  Recent Labs: Lab Results  Component Value Date   WBC 6.2 07/10/2020   HGB 12.1 (L) 07/10/2020   PLT 177 07/10/2020   NA 136 07/10/2020   K 3.5 07/10/2020   CL 105 07/10/2020   CO2 21 (L) 07/10/2020   GLUCOSE 125 (H) 07/10/2020   BUN 18 07/10/2020   CREATININE 0.98 07/10/2020   BILITOT 0.8 07/07/2020   ALKPHOS 33 (L) 07/07/2020   AST 19 07/07/2020   ALT 15 07/07/2020   PROT 7.1 07/07/2020   ALBUMIN 4.5 07/07/2020   CALCIUM 9.0 07/10/2020   GFRAA 87 03/04/2020    Speciality Comments: PLQ Eye Exam 02/02/2020 WNL @ [facility] Follow up in 1 year.  Procedures:  No procedures performed Allergies: Atorvastatin and Lipitor [atorvastatin  calcium]   Assessment / Plan:     Visit Diagnoses: Other systemic lupus erythematosus with other organ involvement (HCC) - +ANA, +RF:history of oral ulcers, nasal ulcers, malar rash, photosensitivity or sicca symptoms.  -He is clinically doing well.  He does autoimmune labs have been negative so far.  Plan: Sedimentation rate, C3 and C4, Anti-DNA antibody, double-stranded, Protein / creatinine ratio, urine  High risk medication use - Plaquenil 200 mg twice daily. PLQ Eye Exam 02/02/2020. - Plan: CBC with Differential/Platelet, COMPLETE METABOLIC PANEL WITH GFR his last examination was February 02, 2020.  He has been advised to get repeat eye examination.  Information regarding immunization was placed in the AVS.  Other proteinuria-resolved at the last visit.  History of pulmonary embolism - on aspirin.  History of pleural effusion  Essential hypertension - followed by cardiology  Thoracic aortic aneurysm without rupture, unspecified part  Cerebrovascular accident (CVA) due to other mechanism ([place]) - 06/2020-loss of left eye peripheral vision.  History of gastroesophageal reflux (GERD)  Positive QuantiFERON-TB Gold test  OSA on CPAP  COVID-19 virus infection - 08/2020 recovered without problems.  Orders: Orders Placed This Encounter  Procedures   CBC with Differential/Platelet   COMPLETE METABOLIC PANEL WITH GFR   Sedimentation rate   C3 and C4   Anti-DNA antibody, double-stranded   Protein / creatinine ratio, urine    No orders of the defined types were placed in this encounter.    Follow-Up Instructions: Return in about 5 months (around 06/04/2021) for Systemic lupus.   Bo Merino, MD  Note - This record has been created using Editor, commissioning.  Chart creation errors have been sought, but may not always  have been located. Such creation errors do not reflect on  the standard of medical care.

## 2020-12-26 NOTE — Progress Notes (Signed)
Cardiology Office Note:    Date:  12/27/2020   ID:  Tyler Norris, DOB [birth year], MRN [id number]  PCP:  [name], MD   [facility] Providers Cardiologist:  [name], MD     Referring MD: [name], MD   CC: Stroke f/u  History of Present Illness:    Tyler Norris is a 86 y.o. male with a hx of pericardial effusion with query of increase pressures in 2016, HTN and DM, OSA, HLD CAC and Aortic Atherosclerosis, Lupus TAA (mild in 2016) and AAA (vs ULN in 2013).  Presented for evaluation 09/01/20.  Patient had R PCA territory stroke 07/10/2020 (had unilateral vision loss). TAA and Effusion concerns had resolved on 06/2020 echo. Medication optimization done at initial visit and seen 12/27/20.  Patient notes that he is doing well.  Has resumed activity without issues.  Going to the gym and is doing the squat machine with low weights.  Does some pull ups without issues.  Leveling a deck without issues at his home.  Is working on crunches to improved his abs.  No change in vision (has some SDH post stroke).  No chest pain or pressure .  No SOB/DOE and no PND/Orthopnea.  No weight gain or leg swelling.  No palpitations or syncope.  Ambulatory blood pressure not done.   Past Medical History:  Diagnosis Date   AAA (abdominal aortic aneurysm)    a. resolved by ct scan 09-2011   Anemia    Arthritis    Barrett's esophagus    Carpal tunnel syndrome, bilateral    Essential hypertension    Fibromyalgia    GERD (gastroesophageal reflux disease)    History of pneumonia    Hyperlipidemia    Lupus (systemic lupus erythematosus) (HCC)    Nephrolithiasis    Pericardial effusion    a. 08/22/2014 Echo: EF 60-65%, mildly dil Ao root (64mm), mildly dil LA, mod-large partially organized pericardial effusion with possible early tamponade - asymptomatic on high dose nsaids-->conservative mgmt;  b. 08/2014 Limited Echo: EF 55-60%, Triv - small effusion.   Sleep apnea     a. uses cpap every night    Past Surgical History:  Procedure Laterality Date   APPENDECTOMY     CARPAL TUNNEL RELEASE  10/12/2011   Procedure: CARPAL TUNNEL RELEASE;  Surgeon: Cammie Sickle., MD;  Location: [employer];  Service: Orthopedics;  Laterality: Left;   CARPAL TUNNEL RELEASE  12/01/2011   Procedure: CARPAL TUNNEL RELEASE;  Surgeon: Cammie Sickle., MD;  Location: [facility];  Service: Orthopedics;  Laterality: Right;   CYSTOSCOPY W/ STONE MANIPULATION  2001,2009   HAND SURGERY     scar repaired lt hand   HERNIA REPAIR  1966   IR ANGIO INTRA EXTRACRAN SEL INTERNAL CAROTID BILAT MOD SED  07/09/2020   IR ANGIO VERTEBRAL SEL SUBCLAVIAN INNOMINATE UNI R MOD SED  07/09/2020   IR ANGIO VERTEBRAL SEL VERTEBRAL UNI L MOD SED  07/09/2020   IR US GUIDE VASC ACCESS RIGHT  07/09/2020   KNEE ARTHROPLASTY     KNEE ARTHROSCOPY Left    TONSILLECTOMY      Current Medications: Current Meds  Medication Sig   acetaminophen (TYLENOL) 500 MG tablet Take 500 mg by mouth every 8 (eight) hours as needed for mild pain.   acyclovir (ZOVIRAX) 400 MG tablet Take 400 mg by mouth 2 (two) times daily.   amLODipine (NORVASC) 5 MG tablet Take 1 tablet (  5 mg total) daily by mouth.   aspirin 81 MG tablet Take 81 mg by mouth daily.   calcium-vitamin D (OSCAL WITH D) 500-200 MG-UNIT tablet Take 1 tablet by mouth daily with breakfast.   clopidogrel (PLAVIX) 75 MG tablet daily.   fenofibrate (TRICOR) 145 MG tablet Take 145 mg by mouth daily.   ferrous sulfate 325 (65 FE) MG tablet Take 325 mg by mouth as needed.   folic acid (FOLVITE) 782 MCG tablet Take 800 mcg by mouth daily.   hydroxychloroquine (PLAQUENIL) 200 MG tablet Take one tablet by mouth twice daily Monday-Friday only.   magnesium oxide (MAG-OX) 400 (241.3 MG) MG tablet Take 1 tablet (400 mg total) by mouth 3 (three) times daily.   metFORMIN (GLUCOPHAGE) 500 MG tablet Take 500 mg by mouth daily.   metoprolol  tartrate (LOPRESSOR) 25 MG tablet Take 25 mg by mouth in the morning and at bedtime.   Multiple Vitamin (MULTIVITAMIN WITH MINERALS) TABS Take 1 tablet by mouth daily.   Omega-3 Fatty Acids (FISH OIL) 1200 MG CAPS Take 1,200 mg by mouth daily.    pantoprazole (PROTONIX) 40 MG tablet Take 40 mg by mouth 2 (two) times daily.   rosuvastatin (CRESTOR) 5 MG tablet Take 5 mg by mouth every other day.   tamsulosin (FLOMAX) 0.4 MG CAPS capsule Take 0.4 mg by mouth daily.   telmisartan-hydrochlorothiazide (MICARDIS HCT) 80-12.5 MG tablet Take 1 tablet by mouth daily.   vitamin C (ASCORBIC ACID) 500 MG tablet Take 500 mg by mouth daily.   vitamin E 400 UNIT capsule Take 400 Units by mouth daily.     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  Tobacco Use   Smoking status: Never   Smokeless tobacco: Never  Vaping Use   Vaping Use: Never used  Substance and Sexual Activity   Alcohol use: Yes    Comment: once every 6 months   Drug use: Never   Sexual activity: Not on file  Other Topics Concern   Not on file  Social History Narrative   Not on file   Social Determinants of Health   Financial Resource Strain: Low Risk    Difficulty of Paying Living Expenses: Not hard at all  Food Insecurity: No Food Insecurity   Worried About Charity fundraiser in the Last Year: Never true   [place] in the Last Year: Never true  Transportation Needs: No Transportation Needs   Lack of Transportation (Medical): No   Lack of Transportation (Non-Medical): No  Physical Activity: Not on file  Stress: No Stress Concern Present   Feeling of Stress : Not at all  Social Connections: Socially  Integrated   Frequency of Communication with Friends and Family: More than three times a week   Frequency of Social Gatherings with Friends and Family: More than three times a week   Attends Religious Services: 1 to 4 times per year   Active Member of [employer] or Organizations: Yes   Attends Archivist Meetings: 1 to 4 times per year   Marital Status: Married    Social: has [place] friends and  usually makes reference to [place] things often in our visits, goes to the gym   Family History: The patient's family history includes Diabetes in his mother and sister; Healthy in his daughter and son; Stroke in his sister.  ROS:   Please see the history of present illness.     All other systems reviewed and are negative.  EKGs/Labs/Other Studies Reviewed:    The following studies were reviewed today:  EKG:   5/13//22: SR 71 occasional PVC  Cardiac Event Monitoring: Date: 08/19/20 Results: Patient had a minimum heart rate of 38 bpm, maximum heart rate of 110 bpm, and average heart rate of 63 bpm. Predominant underlying rhythm was sinus rhythm. Isolated PACs were occasional (2.0%). Isolated PVCs were occasional (1.0%). No evidence of complete heart block or atrial fibrillation. Triggered and diary events associated with sinus arrhythmia.   No malignant arrhythmias.    Transthoracic Echocardiogram: Date: 07/09/2020 Results: no recurrent effusion 1. Left ventricular ejection fraction, by estimation, is 55 to 60%. The  left ventricle has normal function. The left ventricle has no regional  wall motion abnormalities. There is mild left ventricular hypertrophy.  Left ventricular diastolic parameters  are consistent with Grade I diastolic dysfunction (impaired relaxation).   2. Right ventricular systolic function is normal. The right ventricular  size is normal. There is normal pulmonary artery systolic pressure.   3. Left atrial size was moderately dilated.   4. Right atrial size  was moderately dilated.   5. The mitral valve is normal in structure. Mild mitral valve  regurgitation. No evidence of mitral stenosis.   6. The aortic valve is tricuspid. There is mild calcification of the  aortic valve. Aortic valve regurgitation is not visualized. No aortic  stenosis is present.   7. The inferior vena cava is dilated in size with >50% respiratory  variability, suggesting right atrial pressure of 8 mmHg.   NonCardiac CT : Date: 05/28/2015 Results: AA ULN Aortic Atherosclerosis CAC   Recent Labs: 07/07/2020: ALT 15 07/10/2020: BUN 18; Creatinine, Ser 0.98; Hemoglobin 12.1; Platelets 177; Potassium 3.5; Sodium 136  Recent Lipid Panel    Component Value Date/Time   CHOL 147 07/08/2020 0307   TRIG 234 (H) 07/08/2020 0307   HDL 33 (L) 07/08/2020 0307   CHOLHDL 4.5 07/08/2020 0307   VLDL 47 (H) 07/08/2020 0307   LDLCALC 67 07/08/2020 0307    Physical Exam:    VS:  BP 136/70   Pulse 61   Ht $R'5\' 10"'DO$  (1.778 m)   Wt 211 lb (95.7 kg)   SpO2 97%   BMI 30.28 kg/m     Wt Readings from Last 3 Encounters:  12/27/20 211 lb (95.7 kg)  09/01/20 198 lb (89.8 kg)  08/03/20 204 lb (92.5 kg)     GEN:  Well nourished, well developed in no acute distress HEENT: Bilateral Frank's Sign NECK: No JVD; No carotid bruits  LYMPHATICS: No lymphadenopathy CARDIAC: RRR, no murmurs, rubs, gallops RESPIRATORY:  Clear to auscultation without rales, wheezing or rhonchi  ABDOMEN: Soft, non-tender, non-distended; small hiatal hernia MUSCULOSKELETAL:  No edema; No deformity  SKIN: Warm and dry NEUROLOGIC:  Alert and oriented x 3; frequent blinking tic PSYCHIATRIC:  Normal affect   ASSESSMENT:    1. Pararenal abdominal aortic aneurysm (AAA) without rupture   2. History of stroke   3. Hypertension associated with diabetes ([place])   4. OSA on CPAP   5. Systemic lupus erythematosus, unspecified SLE type, unspecified organ involvement status ([place])  PLAN:    Prior  Stroke -  history of SDH without limitations on ASA  HLD, CAC, and Aortic Atherosclerosis with atorvastatin allergies HTN & DM and OSA on CPAP Lupus  TAA (mild in 2016 resolved in 2022)  AAA (vs ULN in 2013) - LDL goal < 70 met - Continue ASA 81 mg PO Daily - continue rosuvastatin 5 mg (on fenofibrate)  - continue telmisartan and HCTZ 80/12.5 - continue amlodipine 5 mg PO  - amb BP to be resumed - will repeat AA Duplex - patient will call in to confirm what medication he is taking - I have discussed my concerns with him driving; these are not cardiac in origin but he should be assessed by a qualified professional; he notes he only drives in the day and short   Will plan for one year follow up unless new symptoms or abnormal test results warranting change in plan  Would be reasonable for  APP Follow up    Medication Adjustments/Labs and Tests Ordered: Current medicines are reviewed at length with the patient today.  Concerns regarding medicines are outlined above.  Orders Placed This Encounter  Procedures   VAS [place] AAA DUPLEX    No orders of the defined types were placed in this encounter.   Patient Instructions  Medication Instructions:  Your physician recommends that you continue on your current medications as directed. Please refer to the Current Medication list given to you today.  *If you need a refill on your cardiac medications before your next appointment, please call your pharmacy*   Lab Work: None If you have labs (blood work) drawn today and your tests are completely normal, you will receive your results only by: MyChart Message (if you have MyChart) OR A paper copy in the mail If you have any lab test that is abnormal or we need to change your treatment, we will call you to review the results.   Testing/Procedures: Your physician has requested that you have an abdominal aorta duplex. During this test, an ultrasound is used to evaluate the aorta. Allow 30 minutes for  this exam. Do not eat after midnight the day before and avoid carbonated beverages   Follow-Up: At [facility], you and your health needs are our priority.  As part of our continuing mission to provide you with exceptional heart care, we have created designated Provider Care Teams.  These Care Teams include your primary Cardiologist (physician) and Advanced Practice Providers (APPs -  Physician Assistants and Nurse Practitioners) who all work together to provide you with the care you need, when you need it.  We recommend signing up for the patient portal called "MyChart".  Sign up information is provided on this After Visit Summary.  MyChart is used to connect with patients for Virtual Visits (Telemedicine).  Patients are able to view lab/test results, encounter notes, upcoming appointments, etc.  Non-urgent messages can be sent to your provider as well.   To learn more about what you can do with MyChart, go to [email or web address].    Your next appointment:   1 year(s)  The format for your next appointment:   In Person  Provider:   You may see Christell Constant, MD or one of the following Advanced Practice Providers on your designated Care Team:   Ronie Spies, PA-C Jacolyn Reedy, PA-C   Other Instructions Call us with an updated medication list this week.    Signed, Christell Constant, MD  12/27/2020 10:12 AM  [facility]

## 2020-12-27 ENCOUNTER — Encounter: Payer: Self-pay | Admitting: Internal Medicine

## 2020-12-27 ENCOUNTER — Other Ambulatory Visit: Payer: Self-pay

## 2020-12-27 ENCOUNTER — Ambulatory Visit (INDEPENDENT_AMBULATORY_CARE_PROVIDER_SITE_OTHER): Payer: Medicare Other | Admitting: Internal Medicine

## 2020-12-27 VITALS — BP 136/70 | HR 61 | Ht 70.0 in | Wt 211.0 lb

## 2020-12-27 DIAGNOSIS — G4733 Obstructive sleep apnea (adult) (pediatric): Secondary | ICD-10-CM

## 2020-12-27 DIAGNOSIS — Z8673 Personal history of transient ischemic attack (TIA), and cerebral infarction without residual deficits: Secondary | ICD-10-CM | POA: Diagnosis not present

## 2020-12-27 DIAGNOSIS — I152 Hypertension secondary to endocrine disorders: Secondary | ICD-10-CM

## 2020-12-27 DIAGNOSIS — Z9989 Dependence on other enabling machines and devices: Secondary | ICD-10-CM

## 2020-12-27 DIAGNOSIS — I7141 Pararenal abdominal aortic aneurysm, without rupture: Secondary | ICD-10-CM

## 2020-12-27 DIAGNOSIS — M329 Systemic lupus erythematosus, unspecified: Secondary | ICD-10-CM | POA: Diagnosis not present

## 2020-12-27 DIAGNOSIS — E1159 Type 2 diabetes mellitus with other circulatory complications: Secondary | ICD-10-CM | POA: Diagnosis not present

## 2020-12-27 NOTE — Patient Instructions (Signed)
Medication Instructions:  Your physician recommends that you continue on your current medications as directed. Please refer to the Current Medication list given to you today.  *If you need a refill on your cardiac medications before your next appointment, please call your pharmacy*   Lab Work: None If you have labs (blood work) drawn today and your tests are completely normal, you will receive your results only by: [place] (if you have MyChart) OR A paper copy in the mail If you have any lab test that is abnormal or we need to change your treatment, we will call you to review the results.   Testing/Procedures: Your physician has requested that you have an abdominal aorta duplex. During this test, an ultrasound is used to evaluate the aorta. Allow 30 minutes for this exam. Do not eat after midnight the day before and avoid carbonated beverages   Follow-Up: At [facility], you and your health needs are our priority.  As part of our continuing mission to provide you with exceptional heart care, we have created designated Provider Care Teams.  These Care Teams include your primary Cardiologist (physician) and Advanced Practice Providers (APPs -  Physician Assistants and Nurse Practitioners) who all work together to provide you with the care you need, when you need it.  We recommend signing up for the patient portal called "MyChart".  Sign up information is provided on this After Visit Summary.  MyChart is used to connect with patients for Virtual Visits (Telemedicine).  Patients are able to view lab/test results, encounter notes, upcoming appointments, etc.  Non-urgent messages can be sent to your provider as well.   To learn more about what you can do with MyChart, go to [email or web address].    Your next appointment:   1 year(s)  The format for your next appointment:   In Person  Provider:   You may see [name], MD or one of the following Advanced Practice  Providers on your designated Care Team:   Melina Copa, PA-C Ermalinda Barrios, PA-C   Other Instructions Call us with an updated medication list this week.

## 2020-12-31 DIAGNOSIS — N3941 Urge incontinence: Secondary | ICD-10-CM | POA: Diagnosis not present

## 2021-01-04 ENCOUNTER — Ambulatory Visit (INDEPENDENT_AMBULATORY_CARE_PROVIDER_SITE_OTHER): Payer: Medicare Other | Admitting: Rheumatology

## 2021-01-04 ENCOUNTER — Encounter: Payer: Self-pay | Admitting: Rheumatology

## 2021-01-04 ENCOUNTER — Other Ambulatory Visit: Payer: Self-pay

## 2021-01-04 VITALS — BP 154/71 | HR 61 | Ht 69.5 in | Wt 209.0 lb

## 2021-01-04 DIAGNOSIS — U071 COVID-19: Secondary | ICD-10-CM | POA: Diagnosis not present

## 2021-01-04 DIAGNOSIS — R808 Other proteinuria: Secondary | ICD-10-CM

## 2021-01-04 DIAGNOSIS — Z8719 Personal history of other diseases of the digestive system: Secondary | ICD-10-CM | POA: Diagnosis not present

## 2021-01-04 DIAGNOSIS — I6389 Other cerebral infarction: Secondary | ICD-10-CM | POA: Diagnosis not present

## 2021-01-04 DIAGNOSIS — Z86711 Personal history of pulmonary embolism: Secondary | ICD-10-CM

## 2021-01-04 DIAGNOSIS — I712 Thoracic aortic aneurysm, without rupture, unspecified: Secondary | ICD-10-CM

## 2021-01-04 DIAGNOSIS — M3219 Other organ or system involvement in systemic lupus erythematosus: Secondary | ICD-10-CM | POA: Diagnosis not present

## 2021-01-04 DIAGNOSIS — G4733 Obstructive sleep apnea (adult) (pediatric): Secondary | ICD-10-CM | POA: Diagnosis not present

## 2021-01-04 DIAGNOSIS — I10 Essential (primary) hypertension: Secondary | ICD-10-CM | POA: Diagnosis not present

## 2021-01-04 DIAGNOSIS — Z8709 Personal history of other diseases of the respiratory system: Secondary | ICD-10-CM | POA: Diagnosis not present

## 2021-01-04 DIAGNOSIS — R7612 Nonspecific reaction to cell mediated immunity measurement of gamma interferon antigen response without active tuberculosis: Secondary | ICD-10-CM

## 2021-01-04 DIAGNOSIS — Z8669 Personal history of other diseases of the nervous system and sense organs: Secondary | ICD-10-CM

## 2021-01-04 DIAGNOSIS — Z79899 Other long term (current) drug therapy: Secondary | ICD-10-CM

## 2021-01-04 DIAGNOSIS — I639 Cerebral infarction, unspecified: Secondary | ICD-10-CM

## 2021-01-04 DIAGNOSIS — Z9989 Dependence on other enabling machines and devices: Secondary | ICD-10-CM

## 2021-01-04 NOTE — Patient Instructions (Signed)
Vaccines You are taking a medication(s) that can suppress your immune system.  The following immunizations are recommended: Flu annually Covid-19  Td/Tdap (tetanus, diphtheria, pertussis) every 10 years Pneumonia (Prevnar 15 then Pneumovax 23 at least 1 year apart.  Alternatively, can take Prevnar 20 without needing additional dose) Shingrix: 2 doses from 4 weeks to 6 months apart  Please check with your PCP to make sure you are up to date.  

## 2021-01-05 ENCOUNTER — Telehealth: Payer: Self-pay | Admitting: *Deleted

## 2021-01-05 NOTE — Telephone Encounter (Signed)
Patient states he is having pain in his left knee and left ankle. Patient states he mentioned it at his appointment yesterday. Patient states the pain has been going on for about 8-10 years, patient can not give a timeline as to when it has started getting worse. Patient states he has had injections in th past. Patient states he has taken Tylenol, heat and ice. Patient states he nothing helps with the pain. Patient states he is taking his PLQ as prescribed. Please advise.

## 2021-01-05 NOTE — Telephone Encounter (Signed)
He may use topical Voltaren gel over-the-counter or Aspercreme.

## 2021-01-05 NOTE — Telephone Encounter (Signed)
Patient advised he may use topical Voltaren gel over-the-counter or Aspercreme. Patient expressed understanding.

## 2021-01-06 LAB — COMPLETE METABOLIC PANEL WITH GFR
AG Ratio: 2.2 (calc) (ref 1.0–2.5)
ALT: 11 U/L (ref 9–46)
AST: 16 U/L (ref 10–35)
Albumin: 4.4 g/dL (ref 3.6–5.1)
Alkaline phosphatase (APISO): 30 U/L — ABNORMAL LOW (ref 35–144)
BUN: 24 mg/dL (ref 7–25)
CO2: 26 mmol/L (ref 20–32)
Calcium: 9.7 mg/dL (ref 8.6–10.3)
Chloride: 105 mmol/L (ref 98–110)
Creat: 0.91 mg/dL (ref 0.70–1.22)
Globulin: 2 g/dL (calc) (ref 1.9–3.7)
Glucose, Bld: 212 mg/dL — ABNORMAL HIGH (ref 65–99)
Potassium: 4.3 mmol/L (ref 3.5–5.3)
Sodium: 140 mmol/L (ref 135–146)
Total Bilirubin: 0.7 mg/dL (ref 0.2–1.2)
Total Protein: 6.4 g/dL (ref 6.1–8.1)
eGFR: 83 mL/min/{1.73_m2} (ref >=60)

## 2021-01-06 LAB — CBC WITH DIFFERENTIAL/PLATELET
Absolute Monocytes: 485 cells/uL (ref 200–950)
Basophils Absolute: 53 cells/uL (ref 0–200)
Basophils Relative: 1.1 %
Eosinophils Absolute: 130 cells/uL (ref 15–500)
Eosinophils Relative: 2.7 %
HCT: 37 % — ABNORMAL LOW (ref 38.5–50.0)
Hemoglobin: 12.4 g/dL — ABNORMAL LOW (ref 13.2–17.1)
Lymphs Abs: 1046 cells/uL (ref 850–3900)
MCH: 31.2 pg (ref 27.0–33.0)
MCHC: 33.5 g/dL (ref 32.0–36.0)
MCV: 93 fL (ref 80.0–100.0)
MPV: 10.9 fL (ref 7.5–12.5)
Monocytes Relative: 10.1 %
Neutro Abs: 3086 cells/uL (ref 1500–7800)
Neutrophils Relative %: 64.3 %
Platelets: 183 10*3/uL (ref 140–400)
RBC: 3.98 10*6/uL — ABNORMAL LOW (ref 4.20–5.80)
RDW: 11.9 % (ref 11.0–15.0)
Total Lymphocyte: 21.8 %
WBC: 4.8 10*3/uL (ref 3.8–10.8)

## 2021-01-06 LAB — PROTEIN / CREATININE RATIO, URINE
Creatinine, Urine: 91 mg/dL (ref 20–320)
Protein/Creat Ratio: 88 mg/g creat (ref 25–148)
Protein/Creatinine Ratio: 0.088 mg/mg creat (ref 0.025–0.148)
Total Protein, Urine: 8 mg/dL (ref 5–25)

## 2021-01-06 LAB — C3 AND C4
C3 Complement: 110 mg/dL
C4 Complement: 16 mg/dL

## 2021-01-06 LAB — ANTI-DNA ANTIBODY, DOUBLE-STRANDED: ds DNA Ab: 2 IU/mL

## 2021-01-06 LAB — SEDIMENTATION RATE: Sed Rate: 2 mm/h (ref 0–20)

## 2021-01-06 NOTE — Progress Notes (Signed)
Double-stranded DNA is negative, urine protein creatinine ratio is normal.

## 2021-01-07 DIAGNOSIS — Z23 Encounter for immunization: Secondary | ICD-10-CM | POA: Diagnosis not present

## 2021-01-10 ENCOUNTER — Ambulatory Visit (HOSPITAL_COMMUNITY)
Admission: RE | Admit: 2021-01-10 | Discharge: 2021-01-10 | Disposition: A | Discharge status: Home / Self Care | Payer: Medicare Other | Source: Ambulatory Visit | Attending: Cardiology | Admitting: Cardiology

## 2021-01-10 ENCOUNTER — Other Ambulatory Visit: Payer: Self-pay

## 2021-01-10 DIAGNOSIS — C44212 Basal cell carcinoma of skin of right ear and external auricular canal: Secondary | ICD-10-CM | POA: Diagnosis not present

## 2021-01-10 DIAGNOSIS — I7141 Pararenal abdominal aortic aneurysm, without rupture: Secondary | ICD-10-CM | POA: Insufficient documentation

## 2021-01-10 DIAGNOSIS — D0321 Melanoma in situ of right ear and external auricular canal: Secondary | ICD-10-CM | POA: Diagnosis not present

## 2021-01-10 DIAGNOSIS — L570 Actinic keratosis: Secondary | ICD-10-CM | POA: Diagnosis not present

## 2021-01-13 ENCOUNTER — Other Ambulatory Visit: Payer: Self-pay | Admitting: Rheumatology

## 2021-01-13 DIAGNOSIS — C4491 Basal cell carcinoma of skin, unspecified: Secondary | ICD-10-CM | POA: Diagnosis not present

## 2021-01-13 DIAGNOSIS — C44212 Basal cell carcinoma of skin of right ear and external auricular canal: Secondary | ICD-10-CM | POA: Diagnosis not present

## 2021-01-13 NOTE — Telephone Encounter (Signed)
I LMOM for patient to call to schedule a follow up appointment in April 2023.

## 2021-01-13 NOTE — Telephone Encounter (Signed)
Please schedule patient for a follow up visit. Patient due April 2023. Thanks!

## 2021-01-13 NOTE — Telephone Encounter (Signed)
Next Visit: Due April 2023. Message sent to the front to schedule   Last Visit: 01/04/2021  Labs: 01/04/2021 Anemia is stable. Rest of CBC WNL.  Glucose is elevated-212. Rest of CMP WNL  Eye exam: 10/27/2020  WNL    Current Dose per office note 01/04/2021: Plaquenil 200 mg twice daily  DX: Other systemic lupus erythematosus with other organ involvement   Last Fill: 10/14/2020  Okay to refill Plaquenil?

## 2021-01-14 ENCOUNTER — Telehealth: Payer: Self-pay

## 2021-01-14 MED ORDER — HYDROXYCHLOROQUINE SULFATE 200 MG PO TABS: ORAL_TABLET | ORAL | 0 refills | Status: DC

## 2021-01-14 NOTE — Telephone Encounter (Signed)
Patient called stating [facility] is out of Plaquenil and is requesting the prescription be sent to [facility] at [address].

## 2021-01-14 NOTE — Telephone Encounter (Signed)
Prescription resent to local pharmacy.

## 2021-02-04 DIAGNOSIS — I679 Cerebrovascular disease, unspecified: Secondary | ICD-10-CM | POA: Diagnosis not present

## 2021-02-04 DIAGNOSIS — C4321 Malignant melanoma of right ear and external auricular canal: Secondary | ICD-10-CM | POA: Diagnosis not present

## 2021-02-04 DIAGNOSIS — E6609 Other obesity due to excess calories: Secondary | ICD-10-CM | POA: Diagnosis not present

## 2021-02-04 DIAGNOSIS — Z6832 Body mass index (BMI) 32.0-32.9, adult: Secondary | ICD-10-CM | POA: Diagnosis not present

## 2021-02-07 DIAGNOSIS — D485 Neoplasm of uncertain behavior of skin: Secondary | ICD-10-CM | POA: Diagnosis not present

## 2021-02-07 DIAGNOSIS — L821 Other seborrheic keratosis: Secondary | ICD-10-CM | POA: Diagnosis not present

## 2021-02-07 DIAGNOSIS — L814 Other melanin hyperpigmentation: Secondary | ICD-10-CM | POA: Diagnosis not present

## 2021-02-07 DIAGNOSIS — L570 Actinic keratosis: Secondary | ICD-10-CM | POA: Diagnosis not present

## 2021-02-07 DIAGNOSIS — D225 Melanocytic nevi of trunk: Secondary | ICD-10-CM | POA: Diagnosis not present

## 2021-02-07 DIAGNOSIS — C4321 Malignant melanoma of right ear and external auricular canal: Secondary | ICD-10-CM | POA: Diagnosis not present

## 2021-02-07 DIAGNOSIS — L989 Disorder of the skin and subcutaneous tissue, unspecified: Secondary | ICD-10-CM | POA: Diagnosis not present

## 2021-02-09 DIAGNOSIS — E785 Hyperlipidemia, unspecified: Secondary | ICD-10-CM | POA: Diagnosis not present

## 2021-02-09 DIAGNOSIS — N2581 Secondary hyperparathyroidism of renal origin: Secondary | ICD-10-CM | POA: Diagnosis not present

## 2021-02-09 DIAGNOSIS — I129 Hypertensive chronic kidney disease with stage 1 through stage 4 chronic kidney disease, or unspecified chronic kidney disease: Secondary | ICD-10-CM | POA: Diagnosis not present

## 2021-02-09 DIAGNOSIS — N182 Chronic kidney disease, stage 2 (mild): Secondary | ICD-10-CM | POA: Diagnosis not present

## 2021-02-09 DIAGNOSIS — M329 Systemic lupus erythematosus, unspecified: Secondary | ICD-10-CM | POA: Diagnosis not present

## 2021-02-09 DIAGNOSIS — R809 Proteinuria, unspecified: Secondary | ICD-10-CM | POA: Diagnosis not present

## 2021-02-09 DIAGNOSIS — D631 Anemia in chronic kidney disease: Secondary | ICD-10-CM | POA: Diagnosis not present

## 2021-02-09 DIAGNOSIS — Z8673 Personal history of transient ischemic attack (TIA), and cerebral infarction without residual deficits: Secondary | ICD-10-CM | POA: Diagnosis not present

## 2021-02-14 DIAGNOSIS — L570 Actinic keratosis: Secondary | ICD-10-CM | POA: Diagnosis not present

## 2021-02-14 DIAGNOSIS — C434 Malignant melanoma of scalp and neck: Secondary | ICD-10-CM | POA: Diagnosis not present

## 2021-02-17 ENCOUNTER — Telehealth: Payer: Self-pay | Admitting: *Deleted

## 2021-02-17 NOTE — Telephone Encounter (Signed)
Labs received from:[facility]  Drawn on:02/09/2021  Reviewed by:Hazel Sams, PA-C  Labs drawn:Renal Function Panel.  Results:Glucose 193   Albumin 4.7   Alb/Creat. Ratio 34   Hgb 12.2    Hct 36.2

## 2021-03-05 DIAGNOSIS — Z20822 Contact with and (suspected) exposure to covid-19: Secondary | ICD-10-CM | POA: Diagnosis not present

## 2021-03-09 DIAGNOSIS — C4321 Malignant melanoma of right ear and external auricular canal: Secondary | ICD-10-CM | POA: Diagnosis not present

## 2021-03-16 DIAGNOSIS — L989 Disorder of the skin and subcutaneous tissue, unspecified: Secondary | ICD-10-CM | POA: Diagnosis not present

## 2021-03-16 DIAGNOSIS — D0362 Melanoma in situ of left upper limb, including shoulder: Secondary | ICD-10-CM | POA: Diagnosis not present

## 2021-04-20 ENCOUNTER — Other Ambulatory Visit: Payer: Self-pay | Admitting: Rheumatology

## 2021-04-20 ENCOUNTER — Telehealth: Payer: Self-pay

## 2021-04-20 MED ORDER — HYDROXYCHLOROQUINE SULFATE 200 MG PO TABS: ORAL_TABLET | ORAL | 0 refills | Status: DC

## 2021-04-20 NOTE — Telephone Encounter (Signed)
Previously spoke with patient and received the pharmacy information. Prescription sent to the pharmacy.

## 2021-04-20 NOTE — Telephone Encounter (Addendum)
Spoke with patient and he uses [facility].   Next Visit: 06/07/2021  Last Visit: 01/04/2021  Labs: 02/09/2021 Glucose 193 Albumin 4.7 Alb/Creat. Ratio 34 Hgb 12.2  Hct 36.2 01/04/2021 Anemia is stable. Rest of CBC WNL.  Glucose is elevated-212. Rest of CMP WNL  Eye exam:  10/27/2020  WNL   Current Dose per office note 01/04/2021: Plaquenil 200 mg twice daily  DU:PBDHD systemic lupus erythematosus with other organ involvement   Last Fill: 01/14/2021  Okay to refill Plaquenil?

## 2021-04-20 NOTE — Telephone Encounter (Signed)
Patient called the office stating he needs a refill of Hydroxychloroquine 200mg  [facility] Patient states in the past it was filled through [facility]. This is a new preferred pharmacy.

## 2021-04-20 NOTE — Telephone Encounter (Signed)
Patient left a voicemail stating he left a message this morning requesting a prescription of his Hydroxychloroquine.  Patient states he received a call asking for the phone number for the new pharmacy and then the phone cut out.  Patient states he also tried to call the office and had a difficult time leaving a message.  Patient states he thinks his phone is being tapped.

## 2021-04-28 DIAGNOSIS — H2513 Age-related nuclear cataract, bilateral: Secondary | ICD-10-CM | POA: Diagnosis not present

## 2021-04-28 DIAGNOSIS — H5340 Unspecified visual field defects: Secondary | ICD-10-CM | POA: Diagnosis not present

## 2021-05-18 DIAGNOSIS — Z20822 Contact with and (suspected) exposure to covid-19: Secondary | ICD-10-CM | POA: Diagnosis not present

## 2021-05-24 NOTE — Progress Notes (Signed)
? ?Office Visit Note ? ?Patient: Tyler Norris             ?Date of Birth: 03-08-1934           ?MRN: [id number]             ?PCP: [name], MD ?Referring: [name], MD ?Visit Date: 06/07/2021 ?Occupation: '@GUAROCC'$ @ ? ?Subjective:  ?Medication management ? ?History of Present Illness: Tyler Norris is a 87 y.o. male history of systemic lupus erythematosus.  He states he is still struggling from left eye peripheral vision loss and short-term memory loss from the stroke he had 1 year ago.  He was diagnosed with melanoma on his right ear in December 2022.  He underwent resection for melanoma at [facility].  He is followed by the surgeons there.  He has been experiencing pain in his left knee and left ankle for about 6 months.  He notices swelling over his left ankle.  He continues to have some fatigue.  He denies any history of oral ulcers, sicca symptoms, Raynaud's phenomenon, photosensitivity or lymphadenopathy. ? ?Activities of Daily Living:  ?Patient reports morning stiffness for 5 minutes.   ?Patient Denies nocturnal pain.  ?Difficulty dressing/grooming: Denies ?Difficulty climbing stairs: Reports ?Difficulty getting out of chair: Denies ?Difficulty using hands for taps, buttons, cutlery, and/or writing: Denies ? ?Review of Systems  ?Constitutional:  Positive for fatigue.  ?HENT:  Negative for mouth sores, mouth dryness and nose dryness.   ?Eyes:  Negative for pain, itching and dryness.  ?Respiratory:  Positive for shortness of breath. Negative for difficulty breathing.   ?Cardiovascular:  Negative for chest pain and palpitations.  ?Gastrointestinal:  Negative for blood in stool, constipation and diarrhea.  ?Endocrine: Negative for increased urination.  ?Genitourinary:  Negative for difficulty urinating.  ?Musculoskeletal:  Positive for joint pain, joint pain, joint swelling and morning stiffness. Negative for myalgias, muscle tenderness and myalgias.  ?Skin:  Negative for color change,  rash, redness and sensitivity to sunlight.  ?Allergic/Immunologic: Negative for susceptible to infections.  ?Neurological:  Positive for memory loss. Negative for dizziness, numbness, headaches and weakness.  ?Hematological:  Negative for bruising/bleeding tendency and swollen glands.  ?Psychiatric/Behavioral:  Negative for depressed mood, confusion and sleep disturbance. The patient is not nervous/anxious.   ? ?PMFS History:  ?Patient Active Problem List  ? Diagnosis Date Noted  ? History of stroke 09/01/2020  ? Aortic atherosclerosis ([place]) 09/01/2020  ? CVA (cerebral vascular accident) ([place]) 07/07/2020  ? Pain in joint of right ankle 03/27/2017  ? SDH (subdural hematoma) ([place]) 01/08/2017  ? Dysphonia 11/15/2015  ? Pleural effusion on left 06/02/2015  ? Aneurysm of thoracic aorta ([place]) 06/02/2015  ? SOB (shortness of breath) 08/22/2014  ? Lupus (systemic lupus erythematosus) ([place])   ? Positive QuantiFERON-TB Gold test 07/05/2012  ? PE (pulmonary embolism) 06/05/2012  ? Pleural effusion exudative 05/26/2012  ? Hypertension associated with diabetes ([place]) 05/13/2012  ? OSA on CPAP 05/13/2012  ? AAA (abdominal aortic aneurysm) without rupture ([place]) 06/27/2011  ?  ?Past Medical History:  ?Diagnosis Date  ? AAA (abdominal aortic aneurysm) ([place])   ? a. resolved by ct scan 09-2011  ? Anemia   ? Arthritis   ? Barrett's esophagus   ? Carpal tunnel syndrome, bilateral   ? Essential hypertension   ? Fibromyalgia   ? GERD (gastroesophageal reflux disease)   ? History of pneumonia   ? Hyperlipidemia   ? Lupus (systemic lupus erythematosus) ([place])   ? Nephrolithiasis   ?  Pericardial effusion   ? a. 08/22/2014 Echo: EF 60-65%, mildly dil Ao root (25m), mildly dil LA, mod-large partially organized pericardial effusion with possible early tamponade - asymptomatic on high dose nsaids-->conservative mgmt;  b. 08/2014 Limited Echo: EF 55-60%, Triv - small effusion.  ? Sleep apnea   ? a. uses cpap every night  ?  ?Family History  ?Problem  Relation Age of Onset  ? Diabetes Mother   ? Diabetes Sister   ? Stroke Sister   ? Healthy Daughter   ? Healthy Son   ? ?Past Surgical History:  ?Procedure Laterality Date  ? APPENDECTOMY    ? CARPAL TUNNEL RELEASE  10/12/2011  ? Procedure: CARPAL TUNNEL RELEASE;  Surgeon: RCammie Sickle, MD;  [facility]  Service: Orthopedics;  Laterality: Left;  ? CARPAL TUNNEL RELEASE  12/01/2011  ? Procedure: CARPAL TUNNEL RELEASE;  Surgeon: RCammie Sickle, MD;  [facility]  Service: Orthopedics;  Laterality: Right;  ? CYSTOSCOPY W/ STONE MANIPULATION  2001,2009  ? HAND SURGERY    ? scar repaired lt hand  ? [facility] ? IR ANGIO INTRA EXTRACRAN SEL INTERNAL CAROTID BILAT MOD SED  07/09/2020  ? IR ANGIO VERTEBRAL SEL SUBCLAVIAN INNOMINATE UNI R MOD SED  07/09/2020  ? IR ANGIO VERTEBRAL SEL VERTEBRAL UNI L MOD SED  07/09/2020  ? IR [place] VASC ACCESS RIGHT  07/09/2020  ? KNEE ARTHROPLASTY    ? KNEE ARTHROSCOPY Left   ? TONSILLECTOMY    ? ?Social History  ? ?Social History Narrative  ? Not on file  ? ?Immunization History  ?Administered Date(s) Administered  ? Fluad Quad(high Dose 65+) 01/16/2019, 10/28/2019  ? Influenza Nasal 11/28/2011  ? Influenza Split 11/27/2013, 11/28/2014  ? Influenza,inj,Quad PF,6+ Mos 11/11/2012, 10/29/2015  ? Influenza-Unspecified 11/16/2014  ? Moderna Sars-Covid-2 Vaccination 03/12/2019, 04/08/2019, 10/27/2019, 06/21/2020  ? Pneumococcal Polysaccharide-23 05/15/2012  ? Tdap 01/07/2017  ? Zoster Recombinat (Shingrix) 01/16/2018, 03/17/2018  ?  ? ?Objective: ?Vital Signs: BP (!) 156/79 (BP Location: Left Arm, Patient Position: Sitting, Cuff Size: Normal)   Pulse (!) 48   Ht 5' 9.5" (1.765 m)   Wt 206 lb 3.2 oz (93.5 kg)   BMI 30.01 kg/m?   ? ?Physical Exam ?Vitals and nursing note reviewed.  ?Constitutional:   ?   Appearance: He is well-developed.  ?HENT:  ?   Head: Normocephalic and atraumatic.  ?Eyes:  ?   Conjunctiva/sclera:  Conjunctivae normal.  ?   Pupils: Pupils are equal, round, and reactive to light.  ?Cardiovascular:  ?   Rate and Rhythm: Normal rate and regular rhythm.  ?   Heart sounds: Normal heart sounds.  ?Pulmonary:  ?   Effort: Pulmonary effort is normal.  ?   Breath sounds: Normal breath sounds.  ?Abdominal:  ?   General: Bowel sounds are normal.  ?   Palpations: Abdomen is soft.  ?Musculoskeletal:  ?   Cervical back: Normal range of motion and neck supple.  ?Skin: ?   General: Skin is warm and dry.  ?   Capillary Refill: Capillary refill takes less than 2 seconds.  ?Neurological:  ?   Mental Status: He is alert and oriented to person, place, and time.  ?Psychiatric:     ?   Behavior: Behavior normal.  ?  ? ?Musculoskeletal Exam: C-spine was in good range of motion.  Shoulder joints, elbow joints, wrist joints, MCPs PIPs and DIPs with good  range of motion.  He had bilateral PIP and DIP thickening with no synovitis.  Hip joints with good range of motion.  Knee joints with good range of motion without any warmth swelling or effusion.  He had discomfort range of motion of his left knee joint.  He had bilateral pedal edema but no tenderness or synovitis was noted over ankle joint. ? ?CDAI Exam: ?CDAI Score: -- ?Patient Global: --; Provider Global: -- ?Swollen: --; Tender: -- ?Joint Exam 06/07/2021  ? ?No joint exam has been documented for this visit  ? ?There is currently no information documented on the homunculus. Go to the Rheumatology activity and complete the homunculus joint exam. ? ?Investigation: ?No additional findings. ? ?Imaging: ?No results found. ? ?Recent Labs: ?Lab Results  ?Component Value Date  ? WBC 4.8 01/04/2021  ? HGB 12.4 (L) 01/04/2021  ? PLT 183 01/04/2021  ? NA 140 01/04/2021  ? K 4.3 01/04/2021  ? CL 105 01/04/2021  ? CO2 26 01/04/2021  ? GLUCOSE 212 (H) 01/04/2021  ? BUN 24 01/04/2021  ? CREATININE 0.91 01/04/2021  ? BILITOT 0.7 01/04/2021  ? ALKPHOS 33 (L) 07/07/2020  ? AST 16 01/04/2021  ? ALT 11  01/04/2021  ? PROT 6.4 01/04/2021  ? ALBUMIN 4.5 07/07/2020  ? CALCIUM 9.7 01/04/2021  ? GFRAA 87 03/04/2020  ? ? ?Speciality Comments: PLQ Eye Exam 10/27/2020  WNL @ [facility] Follow up in 6 months.

## 2021-05-27 DIAGNOSIS — Z9889 Other specified postprocedural states: Secondary | ICD-10-CM | POA: Diagnosis not present

## 2021-05-27 DIAGNOSIS — Z08 Encounter for follow-up examination after completed treatment for malignant neoplasm: Secondary | ICD-10-CM | POA: Diagnosis not present

## 2021-05-27 DIAGNOSIS — C4321 Malignant melanoma of right ear and external auricular canal: Secondary | ICD-10-CM | POA: Diagnosis not present

## 2021-05-27 DIAGNOSIS — Z8673 Personal history of transient ischemic attack (TIA), and cerebral infarction without residual deficits: Secondary | ICD-10-CM | POA: Diagnosis not present

## 2021-05-27 DIAGNOSIS — Z85820 Personal history of malignant melanoma of skin: Secondary | ICD-10-CM | POA: Diagnosis not present

## 2021-06-06 ENCOUNTER — Ambulatory Visit: Payer: Medicare Other | Admitting: Rheumatology

## 2021-06-07 ENCOUNTER — Ambulatory Visit: Payer: Medicare Other | Admitting: Rheumatology

## 2021-06-07 ENCOUNTER — Ambulatory Visit (INDEPENDENT_AMBULATORY_CARE_PROVIDER_SITE_OTHER): Payer: Medicare Other

## 2021-06-07 ENCOUNTER — Ambulatory Visit: Payer: Medicare Other

## 2021-06-07 ENCOUNTER — Ambulatory Visit (INDEPENDENT_AMBULATORY_CARE_PROVIDER_SITE_OTHER): Payer: Medicare Other | Admitting: Rheumatology

## 2021-06-07 ENCOUNTER — Encounter: Payer: Self-pay | Admitting: Rheumatology

## 2021-06-07 VITALS — BP 156/79 | HR 48 | Ht 69.5 in | Wt 206.2 lb

## 2021-06-07 DIAGNOSIS — R808 Other proteinuria: Secondary | ICD-10-CM | POA: Diagnosis not present

## 2021-06-07 DIAGNOSIS — M3219 Other organ or system involvement in systemic lupus erythematosus: Secondary | ICD-10-CM | POA: Diagnosis not present

## 2021-06-07 DIAGNOSIS — U071 COVID-19: Secondary | ICD-10-CM

## 2021-06-07 DIAGNOSIS — R7612 Nonspecific reaction to cell mediated immunity measurement of gamma interferon antigen response without active tuberculosis: Secondary | ICD-10-CM | POA: Diagnosis not present

## 2021-06-07 DIAGNOSIS — G8929 Other chronic pain: Secondary | ICD-10-CM

## 2021-06-07 DIAGNOSIS — C4321 Malignant melanoma of right ear and external auricular canal: Secondary | ICD-10-CM | POA: Diagnosis not present

## 2021-06-07 DIAGNOSIS — I6389 Other cerebral infarction: Secondary | ICD-10-CM

## 2021-06-07 DIAGNOSIS — I712 Thoracic aortic aneurysm, without rupture, unspecified: Secondary | ICD-10-CM

## 2021-06-07 DIAGNOSIS — I10 Essential (primary) hypertension: Secondary | ICD-10-CM

## 2021-06-07 DIAGNOSIS — Z8719 Personal history of other diseases of the digestive system: Secondary | ICD-10-CM

## 2021-06-07 DIAGNOSIS — M25562 Pain in left knee: Secondary | ICD-10-CM

## 2021-06-07 DIAGNOSIS — Z86711 Personal history of pulmonary embolism: Secondary | ICD-10-CM

## 2021-06-07 DIAGNOSIS — G4733 Obstructive sleep apnea (adult) (pediatric): Secondary | ICD-10-CM

## 2021-06-07 DIAGNOSIS — Z79899 Other long term (current) drug therapy: Secondary | ICD-10-CM | POA: Diagnosis not present

## 2021-06-07 DIAGNOSIS — Z8709 Personal history of other diseases of the respiratory system: Secondary | ICD-10-CM

## 2021-06-07 DIAGNOSIS — M25561 Pain in right knee: Secondary | ICD-10-CM

## 2021-06-07 DIAGNOSIS — Z9989 Dependence on other enabling machines and devices: Secondary | ICD-10-CM

## 2021-06-07 MED ORDER — TRIAMCINOLONE ACETONIDE 40 MG/ML IJ SUSP
40.0000 mg | INTRAMUSCULAR | Status: AC | PRN
  Administered 2021-06-07: 40 mg via INTRA_ARTICULAR

## 2021-06-07 MED ORDER — LIDOCAINE HCL 1 % IJ SOLN
1.5000 mL | INTRAMUSCULAR | Status: AC | PRN
  Administered 2021-06-07: 1.5 mL

## 2021-06-08 LAB — COMPLETE METABOLIC PANEL WITH GFR
AG Ratio: 2.2 (calc) (ref 1.0–2.5)
ALT: 11 U/L (ref 9–46)
AST: 15 U/L (ref 10–35)
Albumin: 4.4 g/dL (ref 3.6–5.1)
Alkaline phosphatase (APISO): 32 U/L — ABNORMAL LOW (ref 35–144)
BUN: 25 mg/dL (ref 7–25)
CO2: 27 mmol/L (ref 20–32)
Calcium: 10 mg/dL (ref 8.6–10.3)
Chloride: 105 mmol/L (ref 98–110)
Creat: 1.1 mg/dL (ref 0.70–1.22)
Globulin: 2 g/dL (calc) (ref 1.9–3.7)
Glucose, Bld: 154 mg/dL — ABNORMAL HIGH (ref 65–99)
Potassium: 4.3 mmol/L (ref 3.5–5.3)
Sodium: 142 mmol/L (ref 135–146)
Total Bilirubin: 0.7 mg/dL (ref 0.2–1.2)
Total Protein: 6.4 g/dL (ref 6.1–8.1)
eGFR: 66 mL/min/{1.73_m2} (ref >=60)

## 2021-06-08 LAB — CBC WITH DIFFERENTIAL/PLATELET
Absolute Monocytes: 578 cells/uL (ref 200–950)
Basophils Absolute: 42 cells/uL (ref 0–200)
Basophils Relative: 0.8 %
Eosinophils Absolute: 122 cells/uL (ref 15–500)
Eosinophils Relative: 2.3 %
HCT: 37.7 % — ABNORMAL LOW (ref 38.5–50.0)
Hemoglobin: 12.5 g/dL — ABNORMAL LOW (ref 13.2–17.1)
Lymphs Abs: 1256 cells/uL (ref 850–3900)
MCH: 31.2 pg (ref 27.0–33.0)
MCHC: 33.2 g/dL (ref 32.0–36.0)
MCV: 94 fL (ref 80.0–100.0)
MPV: 10.7 fL (ref 7.5–12.5)
Monocytes Relative: 10.9 %
Neutro Abs: 3302 cells/uL (ref 1500–7800)
Neutrophils Relative %: 62.3 %
Platelets: 188 10*3/uL (ref 140–400)
RBC: 4.01 10*6/uL — ABNORMAL LOW (ref 4.20–5.80)
RDW: 11.8 % (ref 11.0–15.0)
Total Lymphocyte: 23.7 %
WBC: 5.3 10*3/uL (ref 3.8–10.8)

## 2021-06-08 LAB — PROTEIN / CREATININE RATIO, URINE
Creatinine, Urine: 109 mg/dL (ref 20–320)
Protein/Creat Ratio: 73 mg/g creat (ref 25–148)
Protein/Creatinine Ratio: 0.073 mg/mg creat (ref 0.025–0.148)
Total Protein, Urine: 8 mg/dL (ref 5–25)

## 2021-06-08 LAB — C3 AND C4
C3 Complement: 123 mg/dL
C4 Complement: 18 mg/dL

## 2021-06-08 LAB — ANTI-DNA ANTIBODY, DOUBLE-STRANDED: ds DNA Ab: 1 IU/mL

## 2021-06-08 LAB — SEDIMENTATION RATE: Sed Rate: 2 mm/h (ref 0–20)

## 2021-06-09 NOTE — Progress Notes (Signed)
Hemoglobin is low and stable.  Glucose is elevated.  Urine protein creatinine ratio is normal.  Double-stranded DNA is negative.  Complements are normal.  Sed rate is normal.  Labs do not indicate a disease flare.

## 2021-06-15 DIAGNOSIS — Z20822 Contact with and (suspected) exposure to covid-19: Secondary | ICD-10-CM | POA: Diagnosis not present

## 2021-06-25 DIAGNOSIS — Z20822 Contact with and (suspected) exposure to covid-19: Secondary | ICD-10-CM | POA: Diagnosis not present

## 2021-06-27 DIAGNOSIS — D1801 Hemangioma of skin and subcutaneous tissue: Secondary | ICD-10-CM | POA: Diagnosis not present

## 2021-06-27 DIAGNOSIS — L821 Other seborrheic keratosis: Secondary | ICD-10-CM | POA: Diagnosis not present

## 2021-06-27 DIAGNOSIS — L819 Disorder of pigmentation, unspecified: Secondary | ICD-10-CM | POA: Diagnosis not present

## 2021-06-27 DIAGNOSIS — L538 Other specified erythematous conditions: Secondary | ICD-10-CM | POA: Diagnosis not present

## 2021-06-27 DIAGNOSIS — L814 Other melanin hyperpigmentation: Secondary | ICD-10-CM | POA: Diagnosis not present

## 2021-06-27 DIAGNOSIS — L304 Erythema intertrigo: Secondary | ICD-10-CM | POA: Diagnosis not present

## 2021-06-27 DIAGNOSIS — L570 Actinic keratosis: Secondary | ICD-10-CM | POA: Diagnosis not present

## 2021-06-29 DIAGNOSIS — Z20822 Contact with and (suspected) exposure to covid-19: Secondary | ICD-10-CM | POA: Diagnosis not present

## 2021-07-05 DIAGNOSIS — N1831 Chronic kidney disease, stage 3a: Secondary | ICD-10-CM | POA: Diagnosis not present

## 2021-07-05 DIAGNOSIS — N5201 Erectile dysfunction due to arterial insufficiency: Secondary | ICD-10-CM | POA: Diagnosis not present

## 2021-07-05 DIAGNOSIS — M3210 Systemic lupus erythematosus, organ or system involvement unspecified: Secondary | ICD-10-CM | POA: Diagnosis not present

## 2021-07-05 DIAGNOSIS — E119 Type 2 diabetes mellitus without complications: Secondary | ICD-10-CM | POA: Diagnosis not present

## 2021-07-05 DIAGNOSIS — I6350 Cerebral infarction due to unspecified occlusion or stenosis of unspecified cerebral artery: Secondary | ICD-10-CM | POA: Diagnosis not present

## 2021-07-05 DIAGNOSIS — D631 Anemia in chronic kidney disease: Secondary | ICD-10-CM | POA: Diagnosis not present

## 2021-07-05 DIAGNOSIS — K219 Gastro-esophageal reflux disease without esophagitis: Secondary | ICD-10-CM | POA: Diagnosis not present

## 2021-07-05 DIAGNOSIS — Z0000 Encounter for general adult medical examination without abnormal findings: Secondary | ICD-10-CM | POA: Diagnosis not present

## 2021-07-05 DIAGNOSIS — E785 Hyperlipidemia, unspecified: Secondary | ICD-10-CM | POA: Diagnosis not present

## 2021-07-05 DIAGNOSIS — I2510 Atherosclerotic heart disease of native coronary artery without angina pectoris: Secondary | ICD-10-CM | POA: Diagnosis not present

## 2021-07-05 DIAGNOSIS — C4321 Malignant melanoma of right ear and external auricular canal: Secondary | ICD-10-CM | POA: Diagnosis not present

## 2021-07-05 DIAGNOSIS — I10 Essential (primary) hypertension: Secondary | ICD-10-CM | POA: Diagnosis not present

## 2021-07-15 ENCOUNTER — Other Ambulatory Visit: Payer: Self-pay | Admitting: Rheumatology

## 2021-07-15 NOTE — Telephone Encounter (Signed)
Next Visit: 11/14/2021  Last Visit: 06/07/2021  Labs: 06/07/2021 Hemoglobin is low and stable.  Glucose is elevated.  Urine protein creatinine ratio is normal.  Double-stranded DNA is negative.  Complements are normal.  Sed rate is normal.  Labs do not indicate a disease flare.  Eye exam: 10/27/2020  Current Dose per office note on 06/07/2021: Plaquenil 200 mg twice daily.  [id number] systemic lupus erythematosus with other organ involvement   Last Fill: 04/20/2021  Okay to refill Plaquenil?

## 2021-07-29 ENCOUNTER — Ambulatory Visit (INDEPENDENT_AMBULATORY_CARE_PROVIDER_SITE_OTHER): Payer: Medicare Other | Admitting: Pulmonary Disease

## 2021-07-29 ENCOUNTER — Encounter: Payer: Self-pay | Admitting: Pulmonary Disease

## 2021-07-29 DIAGNOSIS — I6389 Other cerebral infarction: Secondary | ICD-10-CM

## 2021-07-29 DIAGNOSIS — Z9989 Dependence on other enabling machines and devices: Secondary | ICD-10-CM | POA: Diagnosis not present

## 2021-07-29 DIAGNOSIS — G4733 Obstructive sleep apnea (adult) (pediatric): Secondary | ICD-10-CM | POA: Diagnosis not present

## 2021-07-29 NOTE — Patient Instructions (Signed)
  X Change autoCPAP 8-15

## 2021-07-29 NOTE — Progress Notes (Signed)
   Subjective:    Patient ID: Tyler Norris, male    DOB: [birth year], 87 y.o.   MRN: [id number]  HPI  87 yo never smoker with SLE diagnosed 2014 for FU of OSA   Maintained on auto CPAP    PMH - Treated with INH for latent TB 12/2012 . Followed by Estanislado Pandy for SLE, on Plaquenil . Off prednisone. Pleural effusions resolved. H/o pulmonary embolism 05/2012 - finished coumadin course  02/2013  CKD 2 - COladonato Melanoma -ear  Chief Complaint  Patient presents with   Follow-up    Feels like pressure not high enough, mask change    50-monthfollow-up visit. Interim - R PCA territory stroke 06/2020 (had unilateral vision loss) He has some residual memory deficits.  He also had a fall from a ladder and is recovering.  I reviewed cardiology evaluation from 2022. CT chest from 09/2018 showed clear lungs and no pleural effusion. He remains on Plaquenil for SLE CPAP is working well and he denies any problems with nasal mask.  He would like slight increase in pressure, he feels like the air is too low    Significant tests/ events reviewed  Thoracentesis 04/2012 of 230 mL of monocytic exudative fluid.    CT angiogram on 06/05/12 showed small subsegmental pulmonary emboli in the right upper lobe, right middle lobe and left lower lobe. A right effusion was noted somewhat loculated and a small left effusion and was layering.      Adm 07/2014 -Pericardial effusion with near tamponade, picked up on CT angio , resolved on FU 10/2014  - on NSAIDs     PFTs  - 04/2014 - unchanged, FEV1 79%, FVC 78% improves to >80% with BDs, TLC 68%, DLCO 55%   05/2015 He developed left pleural effusion and underwent thoracentesis -about 60 mL >> monocytic exudate as before>> plaquanil added   CT angiogram chest 09/2018 -left effusion resolved Less than 4 mm pulmonary nodules  Review of Systems neg for any significant sore throat, dysphagia, itching, sneezing, nasal congestion or excess/ purulent secretions, fever,  chills, sweats, unintended wt loss, pleuritic or exertional cp, hempoptysis, orthopnea pnd or change in chronic leg swelling. Also denies presyncope, palpitations, heartburn, abdominal pain, nausea, vomiting, diarrhea or change in bowel or urinary habits, dysuria,hematuria, rash, arthralgias, visual complaints, headache, numbness weakness or ataxia.     Objective:   Physical Exam  Gen. Pleasant, well-nourished,elderly, in no distress ENT - no thrush, no pallor/icterus,no post nasal drip Neck: No JVD, no thyromegaly, no carotid bruits Lungs: no use of accessory muscles, no dullness to percussion, clear without rales or rhonchi  Cardiovascular: Rhythm regular, heart sounds  normal, no murmurs or gallops, no peripheral edema Musculoskeletal: No deformities, no cyanosis or clubbing        Assessment & Plan:   Pleural effusion -no evidence of recurrence, likely related to lupus, he remains on Plaquenil

## 2021-07-29 NOTE — Assessment & Plan Note (Signed)
CPAP download was reviewed which shows excellent control of events on auto settings 5 to 15 cm with average pressure of 13.5 cm and max pressure of 14.4.  He has minimal leak.  Compliance is good close to 7 hours per night without a single missed night. CPAP is certainly helped improve his daytime somnolence and fatigue At his request we will change to auto settings 8 to 15 cm to provide him slight increased pressure  Weight loss encouraged, compliance with goal of at least 4-6 hrs every night is the expectation. Advised against medications with sedative side effects Cautioned against driving when sleepy - understanding that sleepiness will vary on a day to day basis

## 2021-08-05 DIAGNOSIS — H5340 Unspecified visual field defects: Secondary | ICD-10-CM | POA: Diagnosis not present

## 2021-08-05 DIAGNOSIS — H2513 Age-related nuclear cataract, bilateral: Secondary | ICD-10-CM | POA: Diagnosis not present

## 2021-09-22 DIAGNOSIS — D485 Neoplasm of uncertain behavior of skin: Secondary | ICD-10-CM | POA: Diagnosis not present

## 2021-09-22 DIAGNOSIS — L820 Inflamed seborrheic keratosis: Secondary | ICD-10-CM | POA: Diagnosis not present

## 2021-10-14 DIAGNOSIS — K769 Liver disease, unspecified: Secondary | ICD-10-CM | POA: Diagnosis not present

## 2021-10-14 DIAGNOSIS — C4321 Malignant melanoma of right ear and external auricular canal: Secondary | ICD-10-CM | POA: Diagnosis not present

## 2021-10-14 DIAGNOSIS — Z8673 Personal history of transient ischemic attack (TIA), and cerebral infarction without residual deficits: Secondary | ICD-10-CM | POA: Diagnosis not present

## 2021-10-14 DIAGNOSIS — K7689 Other specified diseases of liver: Secondary | ICD-10-CM | POA: Diagnosis not present

## 2021-10-14 DIAGNOSIS — R918 Other nonspecific abnormal finding of lung field: Secondary | ICD-10-CM | POA: Diagnosis not present

## 2021-10-18 DIAGNOSIS — N182 Chronic kidney disease, stage 2 (mild): Secondary | ICD-10-CM | POA: Diagnosis not present

## 2021-10-18 DIAGNOSIS — N390 Urinary tract infection, site not specified: Secondary | ICD-10-CM | POA: Diagnosis not present

## 2021-10-18 DIAGNOSIS — C439 Malignant melanoma of skin, unspecified: Secondary | ICD-10-CM | POA: Diagnosis not present

## 2021-10-18 DIAGNOSIS — I129 Hypertensive chronic kidney disease with stage 1 through stage 4 chronic kidney disease, or unspecified chronic kidney disease: Secondary | ICD-10-CM | POA: Diagnosis not present

## 2021-10-18 DIAGNOSIS — M329 Systemic lupus erythematosus, unspecified: Secondary | ICD-10-CM | POA: Diagnosis not present

## 2021-10-18 DIAGNOSIS — R413 Other amnesia: Secondary | ICD-10-CM | POA: Diagnosis not present

## 2021-10-20 ENCOUNTER — Other Ambulatory Visit: Payer: Self-pay | Admitting: Rheumatology

## 2021-10-20 NOTE — Telephone Encounter (Signed)
Next Visit: 11/14/2021   Last Visit: 06/07/2021   Labs: 06/07/2021 Hemoglobin is low and stable.  Glucose is elevated.  Urine protein creatinine ratio is normal.  Double-stranded DNA is negative.  Complements are normal.  Sed rate is normal.  Labs do not indicate a disease flare.   Eye exam: 10/27/2020   Current Dose per office note on 06/07/2021: Plaquenil 200 mg twice daily.   [id number] systemic lupus erythematosus with other organ involvement    Last Fill: 07/15/2021   Okay to refill Plaquenil?

## 2021-11-08 NOTE — Progress Notes (Signed)
Office Visit Note  Patient: Tyler Norris             Date of Birth: 1934-12-03           MRN: [id number]             PCP: [name], MD Referring: [name], MD Visit Date: 11/22/2021 Occupation: '@GUAROCC'$ @  Subjective:  Medication management  History of Present Illness: LYCAN DAVEE is a 87 y.o. male with history of systemic lupus and osteoarthritis.  He has been taking hydroxychloroquine 200 mg p.o. twice daily Monday to Friday without any side effects.  He denies any history of oral ulcers, nasal ulcers.  He gives history of dry mouth and dry eyes.  There is no history of malar rash, photosensitivity, Raynaud's phenomenon or lymphadenopathy.  He continues to have some joint discomfort.  He has been going to the gym on a regular basis and working out.  He notices some issues with his memory since he had a stroke.  Activities of Daily Living:  Patient reports morning stiffness for 0 minutes.   Patient Denies nocturnal pain.  Difficulty dressing/grooming: Denies Difficulty climbing stairs: Reports Difficulty getting out of chair: Denies Difficulty using hands for taps, buttons, cutlery, and/or writing: Reports  Review of Systems  Constitutional:  Positive for fatigue.  HENT:  Positive for mouth dryness. Negative for mouth sores.   Eyes:  Positive for photophobia and dryness.  Respiratory:  Negative for shortness of breath.   Cardiovascular:  Negative for chest pain and palpitations.  Gastrointestinal:  Negative for blood in stool, constipation and diarrhea.  Endocrine: Negative for increased urination.  Genitourinary:  Negative for involuntary urination.  Musculoskeletal:  Positive for joint pain, joint pain, joint swelling, myalgias, muscle weakness, muscle tenderness and myalgias. Negative for gait problem and morning stiffness.  Skin:  Negative for color change, rash, hair loss and sensitivity to sunlight.  Allergic/Immunologic: Negative for susceptible to  infections.  Neurological:  Negative for dizziness and headaches.  Hematological:  Negative for swollen glands.  Psychiatric/Behavioral:  Negative for depressed mood and sleep disturbance. The patient is not nervous/anxious.     PMFS History:  Patient Active Problem List   Diagnosis Date Noted   History of stroke 09/01/2020   Aortic atherosclerosis ([place]) 09/01/2020   CVA (cerebral vascular accident) ([place]) 07/07/2020   Pain in joint of right ankle 03/27/2017   SDH (subdural hematoma) ([place]) 01/08/2017   Dysphonia 11/15/2015   Aneurysm of thoracic aorta ([place]) 06/02/2015   SOB (shortness of breath) 08/22/2014   Lupus (systemic lupus erythematosus) ([place])    Positive QuantiFERON-TB Gold test 07/05/2012   PE (pulmonary embolism) 06/05/2012   Hypertension associated with diabetes ([place]) 05/13/2012   OSA on CPAP 05/13/2012   AAA (abdominal aortic aneurysm) without rupture ([place]) 06/27/2011    Past Medical History:  Diagnosis Date   AAA (abdominal aortic aneurysm) (HCC)    a. resolved by ct scan 09-2011   Anemia    Arthritis    Barrett's esophagus    Carpal tunnel syndrome, bilateral    Essential hypertension    Fibromyalgia    GERD (gastroesophageal reflux disease)    History of pneumonia    Hyperlipidemia    Lupus (systemic lupus erythematosus) (HCC)    Nephrolithiasis    Pericardial effusion    a. 08/22/2014 Echo: EF 60-65%, mildly dil Ao root (58m), mildly dil LA, mod-large partially organized pericardial effusion with possible early tamponade - asymptomatic on high dose nsaids-->conservative  mgmt;  b. 08/2014 Limited Echo: EF 55-60%, Triv - small effusion.   Sleep apnea    a. uses cpap every night    Family History  Problem Relation Age of Onset   Diabetes Mother    Diabetes Sister    Stroke Sister    Healthy Daughter    Healthy Son    Past Surgical History:  Procedure Laterality Date   APPENDECTOMY     CARPAL TUNNEL RELEASE  10/12/2011   Procedure: CARPAL TUNNEL  RELEASE;  Surgeon: Cammie Sickle., MD;  Location: [facility];  Service: Orthopedics;  Laterality: Left;   CARPAL TUNNEL RELEASE  12/01/2011   Procedure: CARPAL TUNNEL RELEASE;  Surgeon: Cammie Sickle., MD;  Location: [facility];  Service: Orthopedics;  Laterality: Right;   CYSTOSCOPY W/ STONE MANIPULATION  2001,2009   HAND SURGERY     scar repaired lt hand   HERNIA REPAIR  1966   IR ANGIO INTRA EXTRACRAN SEL INTERNAL CAROTID BILAT MOD SED  07/09/2020   IR ANGIO VERTEBRAL SEL SUBCLAVIAN INNOMINATE UNI R MOD SED  07/09/2020   IR ANGIO VERTEBRAL SEL VERTEBRAL UNI L MOD SED  07/09/2020   IR US GUIDE VASC ACCESS RIGHT  07/09/2020   KNEE ARTHROPLASTY     KNEE ARTHROSCOPY Left    TONSILLECTOMY     Social History   Social History Narrative   Not on file   Immunization History  Administered Date(s) Administered   Fluad Quad(high Dose 65+) 01/16/2019, 10/28/2019   Influenza Nasal 11/28/2011   Influenza Split 11/27/2013, 11/28/2014   Influenza,inj,Quad PF,6+ Mos 11/11/2012, 10/29/2015   Influenza-Unspecified 11/16/2014   Moderna Sars-Covid-2 Vaccination 03/12/2019, 04/08/2019, 10/27/2019, 06/21/2020   Pneumococcal Polysaccharide-23 05/15/2012   Tdap 01/07/2017   Zoster Recombinat (Shingrix) 01/16/2018, 03/17/2018     Objective: Vital Signs: BP (!) 154/79 (BP Location: Left Arm, Patient Position: Sitting, Cuff Size: Normal)   Pulse (!) 49   Resp 17   Ht 5' 9.5" (1.765 m)   Wt 208 lb 12.8 oz (94.7 kg)   BMI 30.39 kg/m    Physical Exam Vitals and nursing note reviewed.  Constitutional:      Appearance: He is well-developed.  HENT:     Head: Normocephalic and atraumatic.  Eyes:     Conjunctiva/sclera: Conjunctivae normal.     Pupils: Pupils are equal, round, and reactive to light.  Cardiovascular:     Rate and Rhythm: Normal rate and regular rhythm.     Heart sounds: Normal heart sounds.  Pulmonary:     Effort: Pulmonary effort is normal.      Breath sounds: Normal breath sounds.  Abdominal:     General: Bowel sounds are normal.     Palpations: Abdomen is soft.  Musculoskeletal:     Cervical back: Normal range of motion and neck supple.  Skin:    General: Skin is warm and dry.     Capillary Refill: Capillary refill takes less than 2 seconds.  Neurological:     Mental Status: He is alert and oriented to person, place, and time.  Psychiatric:        Behavior: Behavior normal.      Musculoskeletal Exam: Cervical spine was in good range of motion.  Shoulder joints, elbow joints, wrist joints were in good range of motion.  He had bilateral PIP and DIP thickening with no synovitis.  Some MCP thickening with no synovitis was noted.  Hip joints and knee joints in  good range of motion without any warmth swelling or effusion.  There was no tenderness over MTPs or PIPs.  CDAI Exam: CDAI Score: -- Patient Global: --; Provider Global: -- Swollen: --; Tender: -- Joint Exam 11/22/2021   No joint exam has been documented for this visit   There is currently no information documented on the homunculus. Go to the Rheumatology activity and complete the homunculus joint exam.  Investigation: No additional findings.  Imaging: No results found.  Recent Labs: Lab Results  Component Value Date   WBC 5.3 06/07/2021   HGB 12.5 (L) 06/07/2021   PLT 188 06/07/2021   NA 142 06/07/2021   K 4.3 06/07/2021   CL 105 06/07/2021   CO2 27 06/07/2021   GLUCOSE 154 (H) 06/07/2021   BUN 25 06/07/2021   CREATININE 1.10 06/07/2021   BILITOT 0.7 06/07/2021   ALKPHOS 33 (L) 07/07/2020   AST 15 06/07/2021   ALT 11 06/07/2021   PROT 6.4 06/07/2021   ALBUMIN 4.5 07/07/2020   CALCIUM 10.0 06/07/2021   GFRAA 87 03/04/2020    Speciality Comments: PLQ Eye Exam 10/27/2020  WNL @ [facility] Follow up in 6 months.  Procedures:  No procedures performed Allergies: Atorvastatin and Lipitor [atorvastatin calcium]   Assessment / Plan:      Visit Diagnoses: Other systemic lupus erythematosus with other organ involvement (HCC) - +ANA, +RF:history of oral ulcers, nasal ulcers, malar rash, photosensitivity or sicca symptoms. -He denies any history of oral ulcers, nasal ulcers, malar rash, photosensitivity.  He continues to have sicca symptoms and some joint pain.  He has not noticed any joint inflammation.  He has been taking hydroxychloroquine 200 mg twice daily Monday to Friday.  We will obtain labs today.  Plan: Anti-DNA antibody, double-stranded, C3 and C4, Sedimentation rate, Protein / creatinine ratio, urine.  Of sunscreen was discussed.  High risk medication use - Plaquenil 200 mg twice daily Monday to Friday. PLQ Eye Exam 10/27/2020  - Plan: CBC with Differential/Platelet, COMPLETE METABOLIC PANEL WITH GFR  Chronic pain of left knee-he has intermittent discomfort.  No warmth swelling or effusion was noted.  Other proteinuria-I will check urine protein creatinine ratio today.  History of pleural effusion no-recurrence.  History of pulmonary embolism  Thoracic aortic aneurysm without rupture, unspecified part ([place])  Cerebrovascular accident (CVA) due to other mechanism ([place]) - 06/2020-loss of left eye peripheral vision.  He has not recovered his vision.  He also complains of memory loss.  Malignant melanoma of right ear [facility]) - diagnosed in December 2022.  He had surgery at [facility].  Essential hypertension-blood pressure was elevated today.  Advised to monitor blood pressure closely.  History of gastroesophageal reflux (GERD)  Positive QuantiFERON-TB Gold test-in 2019.  OSA on CPAP  Orders: Orders Placed This Encounter  Procedures   CBC with Differential/Platelet   COMPLETE METABOLIC PANEL WITH GFR   Anti-DNA antibody, double-stranded   C3 and C4   Sedimentation rate   Protein / creatinine ratio, urine   No orders of the defined types were placed in this encounter.    Follow-Up Instructions:  Return in about 5 months (around 04/24/2022) for Systemic lupus.   Bo Merino, MD  Note - This record has been created using Editor, commissioning.  Chart creation errors have been sought, but may not always  have been located. Such creation errors do not reflect on  the standard of medical care.

## 2021-11-14 ENCOUNTER — Ambulatory Visit: Payer: Medicare Other | Admitting: Rheumatology

## 2021-11-22 ENCOUNTER — Ambulatory Visit: Payer: Medicare Other | Attending: Rheumatology | Admitting: Rheumatology

## 2021-11-22 ENCOUNTER — Encounter: Payer: Self-pay | Admitting: Rheumatology

## 2021-11-22 VITALS — BP 154/79 | HR 49 | Resp 17 | Ht 69.5 in | Wt 208.8 lb

## 2021-11-22 DIAGNOSIS — G4733 Obstructive sleep apnea (adult) (pediatric): Secondary | ICD-10-CM | POA: Insufficient documentation

## 2021-11-22 DIAGNOSIS — I712 Thoracic aortic aneurysm, without rupture, unspecified: Secondary | ICD-10-CM | POA: Insufficient documentation

## 2021-11-22 DIAGNOSIS — I10 Essential (primary) hypertension: Secondary | ICD-10-CM | POA: Insufficient documentation

## 2021-11-22 DIAGNOSIS — R808 Other proteinuria: Secondary | ICD-10-CM | POA: Insufficient documentation

## 2021-11-22 DIAGNOSIS — Z9989 Dependence on other enabling machines and devices: Secondary | ICD-10-CM | POA: Insufficient documentation

## 2021-11-22 DIAGNOSIS — M25562 Pain in left knee: Secondary | ICD-10-CM | POA: Diagnosis not present

## 2021-11-22 DIAGNOSIS — G8929 Other chronic pain: Secondary | ICD-10-CM | POA: Diagnosis not present

## 2021-11-22 DIAGNOSIS — Z79899 Other long term (current) drug therapy: Secondary | ICD-10-CM | POA: Insufficient documentation

## 2021-11-22 DIAGNOSIS — I6389 Other cerebral infarction: Secondary | ICD-10-CM | POA: Insufficient documentation

## 2021-11-22 DIAGNOSIS — Z86711 Personal history of pulmonary embolism: Secondary | ICD-10-CM | POA: Diagnosis not present

## 2021-11-22 DIAGNOSIS — Z8709 Personal history of other diseases of the respiratory system: Secondary | ICD-10-CM | POA: Diagnosis not present

## 2021-11-22 DIAGNOSIS — R7612 Nonspecific reaction to cell mediated immunity measurement of gamma interferon antigen response without active tuberculosis: Secondary | ICD-10-CM | POA: Insufficient documentation

## 2021-11-22 DIAGNOSIS — C4321 Malignant melanoma of right ear and external auricular canal: Secondary | ICD-10-CM | POA: Insufficient documentation

## 2021-11-22 DIAGNOSIS — M3219 Other organ or system involvement in systemic lupus erythematosus: Secondary | ICD-10-CM | POA: Diagnosis not present

## 2021-11-22 DIAGNOSIS — Z8719 Personal history of other diseases of the digestive system: Secondary | ICD-10-CM | POA: Diagnosis not present

## 2021-11-22 NOTE — Patient Instructions (Signed)
Vaccines You are taking a medication(s) that can suppress your immune system.  The following immunizations are recommended: Flu annually Covid-19  Td/Tdap (tetanus, diphtheria, pertussis) every 10 years Pneumonia (Prevnar 15 then Pneumovax 23 at least 1 year apart.  Alternatively, can take Prevnar 20 without needing additional dose) Shingrix: 2 doses from 4 weeks to 6 months apart  Please check with your PCP to make sure you are up to date.  

## 2021-11-23 LAB — CBC WITH DIFFERENTIAL/PLATELET
Absolute Monocytes: 539 cells/uL (ref 200–950)
Basophils Absolute: 49 cells/uL (ref 0–200)
Basophils Relative: 1 %
Eosinophils Absolute: 88 cells/uL (ref 15–500)
Eosinophils Relative: 1.8 %
HCT: 37.6 % — ABNORMAL LOW (ref 38.5–50.0)
Hemoglobin: 12.7 g/dL — ABNORMAL LOW (ref 13.2–17.1)
Lymphs Abs: 1058 cells/uL (ref 850–3900)
MCH: 31.7 pg (ref 27.0–33.0)
MCHC: 33.8 g/dL (ref 32.0–36.0)
MCV: 93.8 fL (ref 80.0–100.0)
MPV: 10.4 fL (ref 7.5–12.5)
Monocytes Relative: 11 %
Neutro Abs: 3165 cells/uL (ref 1500–7800)
Neutrophils Relative %: 64.6 %
Platelets: 197 10*3/uL (ref 140–400)
RBC: 4.01 10*6/uL — ABNORMAL LOW (ref 4.20–5.80)
RDW: 11.8 % (ref 11.0–15.0)
Total Lymphocyte: 21.6 %
WBC: 4.9 10*3/uL (ref 3.8–10.8)

## 2021-11-23 LAB — COMPLETE METABOLIC PANEL WITH GFR
AG Ratio: 2.4 (calc) (ref 1.0–2.5)
ALT: 13 U/L (ref 9–46)
AST: 18 U/L (ref 10–35)
Albumin: 4.5 g/dL (ref 3.6–5.1)
Alkaline phosphatase (APISO): 37 U/L (ref 35–144)
BUN: 22 mg/dL (ref 7–25)
CO2: 28 mmol/L (ref 20–32)
Calcium: 9.6 mg/dL (ref 8.6–10.3)
Chloride: 103 mmol/L (ref 98–110)
Creat: 1 mg/dL (ref 0.70–1.22)
Globulin: 1.9 g/dL (calc) (ref 1.9–3.7)
Glucose, Bld: 211 mg/dL — ABNORMAL HIGH (ref 65–99)
Potassium: 4 mmol/L (ref 3.5–5.3)
Sodium: 141 mmol/L (ref 135–146)
Total Bilirubin: 0.7 mg/dL (ref 0.2–1.2)
Total Protein: 6.4 g/dL (ref 6.1–8.1)
eGFR: 74 mL/min/{1.73_m2} (ref >=60)

## 2021-11-23 LAB — PROTEIN / CREATININE RATIO, URINE
Creatinine, Urine: 133 mg/dL (ref 20–320)
Protein/Creat Ratio: 53 mg/g creat (ref 25–148)
Protein/Creatinine Ratio: 0.053 mg/mg creat (ref 0.025–0.148)
Total Protein, Urine: 7 mg/dL (ref 5–25)

## 2021-11-23 LAB — C3 AND C4
C3 Complement: 123 mg/dL
C4 Complement: 16 mg/dL

## 2021-11-23 LAB — ANTI-DNA ANTIBODY, DOUBLE-STRANDED: ds DNA Ab: 2 IU/mL

## 2021-11-23 LAB — SEDIMENTATION RATE: Sed Rate: 2 mm/h (ref 0–20)

## 2021-11-24 ENCOUNTER — Telehealth: Payer: Self-pay | Admitting: Rheumatology

## 2021-11-24 NOTE — Telephone Encounter (Signed)
Patient called stating he was returning Andrea's call regarding labwork results.

## 2021-11-24 NOTE — Progress Notes (Signed)
Hemoglobin is low and stable.  Glucose is elevated.  Anti-DNA negative, complements normal, sed rate normal, urine protein negative.  No change in treatment advised.  Patient should follow-up with his PCP regarding elevated glucose.  Please forward results to his PCP.

## 2021-12-20 ENCOUNTER — Telehealth: Payer: Self-pay | Admitting: Rheumatology

## 2021-12-20 NOTE — Telephone Encounter (Signed)
Patient advised he most likely has hematoma from the blood draw.  However, as its been almost a month it would be unlikely for the symptoms to get worse.  Patient advised he should get evaluated by his PCP. Patient expressed understanding.

## 2021-12-20 NOTE — Telephone Encounter (Signed)
Attempted to contact the patient and left message for patient to call the office.  

## 2021-12-20 NOTE — Telephone Encounter (Signed)
He most likely has hematoma from the blood draw.  However, as its been almost a month it would be unlikely for the symptoms to get worse.  He should get evaluated by his PCP.

## 2021-12-20 NOTE — Telephone Encounter (Signed)
Patient called stating he had labwork at his last appointment on 11/22/21 and when he took the bandage off he had "a black mark" and swelling.  Patient states he gave it time to go away, but is still experiencing swelling where the blood was taken, weakness in his left arm, and pain that shoots up his shoulder.  Patient states he cannot sleep on his left side.  Patient is worried the needle could have hit a nerve.  Patient requested a return call.

## 2021-12-22 ENCOUNTER — Encounter: Payer: Self-pay | Admitting: Internal Medicine

## 2021-12-22 ENCOUNTER — Ambulatory Visit: Payer: Medicare Other | Attending: Internal Medicine | Admitting: Internal Medicine

## 2021-12-22 VITALS — BP 130/72 | HR 63 | Ht 69.0 in | Wt 207.0 lb

## 2021-12-22 DIAGNOSIS — I700 Atherosclerosis of aorta: Secondary | ICD-10-CM | POA: Insufficient documentation

## 2021-12-22 DIAGNOSIS — I7141 Pararenal abdominal aortic aneurysm, without rupture: Secondary | ICD-10-CM | POA: Insufficient documentation

## 2021-12-22 DIAGNOSIS — E1159 Type 2 diabetes mellitus with other circulatory complications: Secondary | ICD-10-CM | POA: Insufficient documentation

## 2021-12-22 DIAGNOSIS — I712 Thoracic aortic aneurysm, without rupture, unspecified: Secondary | ICD-10-CM | POA: Diagnosis not present

## 2021-12-22 DIAGNOSIS — G4733 Obstructive sleep apnea (adult) (pediatric): Secondary | ICD-10-CM | POA: Insufficient documentation

## 2021-12-22 DIAGNOSIS — I152 Hypertension secondary to endocrine disorders: Secondary | ICD-10-CM

## 2021-12-22 DIAGNOSIS — Z8673 Personal history of transient ischemic attack (TIA), and cerebral infarction without residual deficits: Secondary | ICD-10-CM

## 2021-12-22 NOTE — Progress Notes (Addendum)
Cardiology Office Note:    Date:  12/22/2021   ID:  Tyler Norris, DOB [birth year], MRN [id number]  PCP:  [name], MD   [facility] Providers Cardiologist:  [name], MD     Referring MD: [name], MD   CC: Stroke f/u  History of Present Illness:    Tyler Norris is a 87 y.o. male with a hx of pericardial effusion with query of increase pressures in 2016, HTN and DM, OSA, HLD CAC and Aortic Atherosclerosis, Lupus TAA (mild in 2016) and AAA (vs ULN in 2013).  Presented for evaluation 09/01/20.   2022: Patient had R PCA territory stroke 07/10/2020 (had unilateral vision loss). Mild thoracica aortic dilation and effusion concerns had resolved on 06/2020 echo. Medication optimization done at initial visit and seen 12/27/20. Had mild AAA.  At last visit did not know what medications he was on.  Patient notes that he is doing well.   Since last visit notes that he is still doing some weight lifting. There are no interval hospital/ED visit.   On the 26th of last month and has a bruising.  He cannot move his left arm well since got the blood draw.  Has left arm arm that radiates his arm.  No change with exertion- only with arm movement.  No arm pain.  Has an appointment to morrow to have this reviewed.  No SOB/DOE and no PND/Orthopnea.  No weight gain or leg swelling.  No palpitations or syncope (even during PVCs).   Past Medical History:  Diagnosis Date   AAA (abdominal aortic aneurysm) ([place])    a. resolved by ct scan 09-2011   Anemia    Arthritis    Barrett's esophagus    Carpal tunnel syndrome, bilateral    Essential hypertension    Fibromyalgia    GERD (gastroesophageal reflux disease)    History of pneumonia    Hyperlipidemia    Lupus (systemic lupus erythematosus) (HCC)    Nephrolithiasis    Pericardial effusion    a. 08/22/2014 Echo: EF 60-65%, mildly dil Ao root (52m), mildly dil LA, mod-large partially organized pericardial effusion  with possible early tamponade - asymptomatic on high dose nsaids-->conservative mgmt;  b. 08/2014 Limited Echo: EF 55-60%, Triv - small effusion.   Sleep apnea    a. uses cpap every night    Past Surgical History:  Procedure Laterality Date   APPENDECTOMY     CARPAL TUNNEL RELEASE  10/12/2011   Procedure: CARPAL TUNNEL RELEASE;  Surgeon: RCammie Sickle, MD;  [facility]  Service: Orthopedics;  Laterality: Left;   CARPAL TUNNEL RELEASE  12/01/2011   Procedure: CARPAL TUNNEL RELEASE;  Surgeon: RCammie Sickle, MD;  [facility]  Service: Orthopedics;  Laterality: Right;   CYSTOSCOPY W/ STONE MANIPULATION  2001,2009   HAND SURGERY     scar repaired lt hand   HERNIA REPAIR  1966   IR ANGIO INTRA EXTRACRAN SEL INTERNAL CAROTID BILAT MOD SED  07/09/2020   IR ANGIO VERTEBRAL SEL SUBCLAVIAN INNOMINATE UNI R MOD SED  07/09/2020   IR ANGIO VERTEBRAL SEL VERTEBRAL UNI L MOD SED  07/09/2020   IR [place] VASC ACCESS RIGHT  07/09/2020   KNEE ARTHROPLASTY     KNEE ARTHROSCOPY Left    TONSILLECTOMY      Current Medications: Current Meds  Medication Sig   acyclovir (ZOVIRAX) 400 MG tablet Take 400 mg by mouth 2 (two)  times daily.   amLODipine (NORVASC) 5 MG tablet Take 1 tablet (5 mg total) daily by mouth.   aspirin 81 MG tablet Take 81 mg by mouth daily.   calcium-vitamin D (OSCAL WITH D) 500-200 MG-UNIT tablet Take 1 tablet by mouth daily with breakfast.   fenofibrate (TRICOR) 145 MG tablet Take 145 mg by mouth daily.   ferrous sulfate 325 (65 FE) MG tablet Take 325 mg by mouth as needed.   folic acid (FOLVITE) 662 MCG tablet Take 800 mcg by mouth daily.   hydroxychloroquine (PLAQUENIL) 200 MG tablet TAKE 1 TABLET TWICE A DAY, MONDAY THROUGH FRIDAY ONLY   magnesium oxide (MAG-OX) 400 (241.3 MG) MG tablet Take 1 tablet (400 mg total) by mouth 3 (three) times daily.   metFORMIN (GLUCOPHAGE) 500 MG tablet Take 500 mg by mouth daily.   metoprolol  tartrate (LOPRESSOR) 25 MG tablet Take 25 mg by mouth in the morning and at bedtime.   Multiple Vitamin (MULTIVITAMIN WITH MINERALS) TABS Take 1 tablet by mouth daily.   Omega-3 Fatty Acids (FISH OIL) 1200 MG CAPS Take 1,200 mg by mouth daily.    pantoprazole (PROTONIX) 40 MG tablet Take 40 mg by mouth 2 (two) times daily.   rosuvastatin (CRESTOR) 5 MG tablet Take 5 mg by mouth every other day.   tamsulosin (FLOMAX) 0.4 MG CAPS capsule Take 0.4 mg by mouth daily.   telmisartan-hydrochlorothiazide (MICARDIS HCT) 80-12.5 MG tablet Take 1 tablet by mouth daily.   vitamin C (ASCORBIC ACID) 500 MG tablet Take 500 mg by mouth daily.   vitamin E 400 UNIT capsule Take 400 Units by mouth daily.   [DISCONTINUED] clopidogrel (PLAVIX) 75 MG tablet daily.     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  Tobacco Use   Smoking status: Never    Passive exposure: Never   Smokeless tobacco: Never  Vaping Use   Vaping Use: Never used  Substance and Sexual Activity   Alcohol use: Yes    Comment: once every 6 months   Drug use: Never   Sexual activity: Not on file  Other Topics Concern   Not on file  Social History Narrative   Not on file   Social Determinants of Health   Financial Resource Strain: Low Risk  (07/28/2020)   Overall Financial Resource Strain (CARDIA)    Difficulty of Paying Living Expenses: Not hard at all  Food Insecurity: No Food Insecurity (09/01/2020)   Hunger Vital Sign    Worried About Running Out of Food in the Last Year: Never true    [place] in the Last Year: Never true  Transportation Needs: No Transportation Needs (09/01/2020)   PRAPARE - Armed forces logistics/support/administrative officer (Medical): No    Lack of Transportation (Non-Medical): No  Physical Activity: Not on file  Stress: No Stress Concern Present (09/01/2020)   [place]    Feeling of Stress : Not at all  Social Connections: Socially Integrated (07/28/2020)   Social Connection and Isolation Panel [NHANES]    Frequency of Communication with Friends and Family:  More than three times a week    Frequency of Social Gatherings with Friends and Family: More than three times a week    Attends Religious Services: 1 to 4 times per year    Active Member of [employer] or Organizations: Yes    Attends Archivist Meetings: 1 to 4 times per year    Marital Status: Married    Social: has [place] friends and usually makes reference to [place] things often in our visits, goes to the gym   Family History: The patient's family history includes Diabetes in his mother and sister; Healthy in his daughter and son; Stroke in his sister.  ROS:   Please see the history of present illness.     All other systems reviewed and are negative.  EKGs/Labs/Other Studies Reviewed:    The following studies were reviewed today:  EKG:   10/26/23L sinus bradycardia LBBB with occasional PVCs 5/13//22: SR 71 occasional PVC  Cardiac Event Monitoring: Date: 08/19/20 Results: Patient had a minimum heart rate of 38 bpm, maximum heart rate of 110 bpm, and average heart rate of 63 bpm. Predominant underlying rhythm was sinus rhythm. Isolated PACs were occasional (2.0%). Isolated PVCs were occasional (1.0%). No evidence of complete heart block or atrial fibrillation. Triggered and diary events associated with sinus arrhythmia.   No malignant arrhythmias.    Transthoracic Echocardiogram: Date: 07/09/2020 Results: no recurrent effusion 1. Left ventricular ejection fraction, by estimation, is 55 to 60%. The  left ventricle has normal function. The  left ventricle has no regional  wall motion abnormalities. There is mild left ventricular hypertrophy.  Left ventricular diastolic parameters  are consistent with Grade I diastolic dysfunction (impaired relaxation).   2. Right ventricular systolic function is normal. The right ventricular  size is normal. There is normal pulmonary artery systolic pressure.   3. Left atrial size was moderately dilated.   4. Right atrial size was moderately dilated.   5. The mitral valve is normal in structure. Mild mitral valve  regurgitation. No evidence of mitral stenosis.   6. The aortic valve is tricuspid. There is mild calcification of the  aortic valve. Aortic valve regurgitation is not visualized. No aortic  stenosis is present.   7. The inferior vena cava is dilated in size with >50% respiratory  variability, suggesting right atrial pressure of 8 mmHg.   NonCardiac CT : Date: 05/28/2015 Results: AA ULN Aortic Atherosclerosis CAC   Recent Labs: 11/22/2021: ALT 13; BUN 22; Creat 1.00; Hemoglobin 12.7; Platelets 197; Potassium 4.0; Sodium 141  Recent Lipid Panel    Component Value Date/Time   CHOL 147 07/08/2020 0307   TRIG 234 (H) 07/08/2020 0307   HDL 33 (L) 07/08/2020 0307   CHOLHDL 4.5 07/08/2020 0307   VLDL 47 (H) 07/08/2020 0307   LDLCALC 67 07/08/2020 0307    Physical Exam:    VS:  BP 130/72   Pulse 63   Ht '5\' 9"'$  (1.753 m)   Wt 207 lb (93.9 kg)   SpO2 98%   BMI 30.57 kg/m     Wt Readings from Last 3 Encounters:  12/22/21 207 lb (93.9 kg)  11/22/21 208 lb 12.8 oz (94.7 kg)  07/29/21 205 lb (93 kg)    GEN:  Well nourished, well developed in no acute distress HEENT: Bilateral Frank's Sign NECK: No JVD; LYMPHATICS: No lymphadenopathy CARDIAC: RRR, no murmurs, rubs, gallops:  RESPIRATORY:  Clear to auscultation without rales, wheezing or rhonchi  ABDOMEN: Soft, non-tender, non-distended; small  hiatal hernia MUSCULOSKELETAL:  No edema; No deformity  SKIN: Warm and  dry NEUROLOGIC:  Alert and oriented to person and place; I have had concerns about his understanding of his disease processes ; frequent blinking tic PSYCHIATRIC:  Normal affect   ASSESSMENT:    1. History of stroke   2. Thoracic aortic aneurysm without rupture, unspecified part ([place])   3. Pararenal abdominal aortic aneurysm (AAA) without rupture ([place])   4. Aortic atherosclerosis ([place])   5. Hypertension associated with diabetes ([place])   6. OSA on CPAP     PLAN:    Bruising Prior Stroke HLD, CAC, and Aortic Atherosclerosis with atorvastatin allergies History of SDH - on ASA, stroke was 2022; can stop plavix - LDL goal is slightly above goal; he is dealing with myalgias and I have significant concerns about poly-pharmacy, will not increase statin  - - continue current therapy  PVCs LBBB - asymptomatic - with LVEF normal - continue tartrate 25 mg PO BID  HTN & DM and OSA on CPAP Memory loss - continue telmisartan and HCTZ 80/12.5 - continue amlodipine 5 mg PO  - he gets his medications there [facility] in bottles; we have discussed pill packs; he is pre-contemplative  Repeat AAA  - Mild dilation; will repeat  One year me    Medication Adjustments/Labs and Tests Ordered: Current medicines are reviewed at length with the patient today.  Concerns regarding medicines are outlined above.  Orders Placed This Encounter  Procedures   EKG 12-Lead   VAS [place] AAA DUPLEX    No orders of the defined types were placed in this encounter.    Patient Instructions  Medication Instructions:  Your physician has recommended you make the following change in your medication:  STOP: clopidogrel (Plavix)   *If you need a refill on your cardiac medications before your next appointment, please call your pharmacy*   Lab Work: NONE If you have labs (blood work) drawn today and your tests are completely normal, you will receive your results only by: [place] (if you have  MyChart) OR A paper copy in the mail If you have any lab test that is abnormal or we need to change your treatment, we will call you to review the results.   Testing/Procedures: Your physician has requested that you have an abdominal aorta duplex. During this test, an ultrasound is used to evaluate the aorta. Allow 30 minutes for this exam. Do not eat after midnight the day before and avoid carbonated beverages    Follow-Up: At [facility], you and your health needs are our priority.  As part of our continuing mission to provide you with exceptional heart care, we have created designated Provider Care Teams.  These Care Teams include your primary Cardiologist (physician) and Advanced Practice Providers (APPs -  Physician Assistants and Nurse Practitioners) who all work together to provide you with the care you need, when you need it.  We recommend signing up for the patient portal called "MyChart".  Sign up information is provided on this After Visit Summary.  MyChart is used to connect with patients for Virtual Visits (Telemedicine).  Patients are able to view lab/test results, encounter notes, upcoming appointments, etc.  Non-urgent messages can be sent to your provider as well.   To learn more about what you can do with MyChart, go to [email or web address].    Your next appointment:   1 year(s)  The format for your next appointment:   In Person  Provider:   [name], MD     Important Information About Sugar         Signed, [name], MD  12/22/2021 9:56 AM    [place]

## 2021-12-22 NOTE — Patient Instructions (Signed)
Medication Instructions:  Your physician has recommended you make the following change in your medication:  STOP: clopidogrel (Plavix)   *If you need a refill on your cardiac medications before your next appointment, please call your pharmacy*   Lab Work: NONE If you have labs (blood work) drawn today and your tests are completely normal, you will receive your results only by: [place] (if you have MyChart) OR A paper copy in the mail If you have any lab test that is abnormal or we need to change your treatment, we will call you to review the results.   Testing/Procedures: Your physician has requested that you have an abdominal aorta duplex. During this test, an ultrasound is used to evaluate the aorta. Allow 30 minutes for this exam. Do not eat after midnight the day before and avoid carbonated beverages    Follow-Up: At [facility], you and your health needs are our priority.  As part of our continuing mission to provide you with exceptional heart care, we have created designated Provider Care Teams.  These Care Teams include your primary Cardiologist (physician) and Advanced Practice Providers (APPs -  Physician Assistants and Nurse Practitioners) who all work together to provide you with the care you need, when you need it.  We recommend signing up for the patient portal called "MyChart".  Sign up information is provided on this After Visit Summary.  MyChart is used to connect with patients for Virtual Visits (Telemedicine).  Patients are able to view lab/test results, encounter notes, upcoming appointments, etc.  Non-urgent messages can be sent to your provider as well.   To learn more about what you can do with MyChart, go to [email or web address].    Your next appointment:   1 year(s)  The format for your next appointment:   In Person  Provider:   [name], MD     Important Information About Sugar

## 2021-12-23 DIAGNOSIS — S40029A Contusion of unspecified upper arm, initial encounter: Secondary | ICD-10-CM | POA: Diagnosis not present

## 2021-12-29 DIAGNOSIS — Z789 Other specified health status: Secondary | ICD-10-CM | POA: Diagnosis not present

## 2021-12-29 DIAGNOSIS — L570 Actinic keratosis: Secondary | ICD-10-CM | POA: Diagnosis not present

## 2021-12-29 DIAGNOSIS — Z08 Encounter for follow-up examination after completed treatment for malignant neoplasm: Secondary | ICD-10-CM | POA: Diagnosis not present

## 2021-12-29 DIAGNOSIS — D1801 Hemangioma of skin and subcutaneous tissue: Secondary | ICD-10-CM | POA: Diagnosis not present

## 2021-12-29 DIAGNOSIS — L298 Other pruritus: Secondary | ICD-10-CM | POA: Diagnosis not present

## 2021-12-29 DIAGNOSIS — L821 Other seborrheic keratosis: Secondary | ICD-10-CM | POA: Diagnosis not present

## 2021-12-29 DIAGNOSIS — L814 Other melanin hyperpigmentation: Secondary | ICD-10-CM | POA: Diagnosis not present

## 2021-12-29 DIAGNOSIS — Z85820 Personal history of malignant melanoma of skin: Secondary | ICD-10-CM | POA: Diagnosis not present

## 2021-12-29 DIAGNOSIS — D170 Benign lipomatous neoplasm of skin and subcutaneous tissue of head, face and neck: Secondary | ICD-10-CM | POA: Diagnosis not present

## 2021-12-29 DIAGNOSIS — L538 Other specified erythematous conditions: Secondary | ICD-10-CM | POA: Diagnosis not present

## 2021-12-29 DIAGNOSIS — L820 Inflamed seborrheic keratosis: Secondary | ICD-10-CM | POA: Diagnosis not present

## 2022-01-02 ENCOUNTER — Ambulatory Visit (HOSPITAL_COMMUNITY)
Admission: RE | Admit: 2022-01-02 | Discharge: 2022-01-02 | Disposition: A | Discharge status: Home / Self Care | Payer: Medicare Other | Source: Ambulatory Visit | Attending: Internal Medicine | Admitting: Internal Medicine

## 2022-01-02 DIAGNOSIS — I7143 Infrarenal abdominal aortic aneurysm, without rupture: Secondary | ICD-10-CM | POA: Diagnosis not present

## 2022-01-02 DIAGNOSIS — I712 Thoracic aortic aneurysm, without rupture, unspecified: Secondary | ICD-10-CM | POA: Diagnosis not present

## 2022-01-25 ENCOUNTER — Other Ambulatory Visit: Payer: Self-pay | Admitting: Rheumatology

## 2022-01-25 NOTE — Telephone Encounter (Signed)
Next Visit: 04/28/2022  Last Visit: 11/22/2021  Labs: 11/22/2021 Hemoglobin is low and stable.  Glucose is elevated.  A   Eye exam: 08/05/2021 WNL   Current Dose per office note 11/22/2021: -Plaquenil 200 mg twice daily Monday to Friday.   VQ:MGQQP systemic lupus erythematosus with other organ involvement   Last Fill: 10/20/2021  Okay to refill Plaquenil?

## 2022-02-27 HISTORY — PX: MELANOMA EXCISION: SHX5266

## 2022-04-14 DIAGNOSIS — K769 Liver disease, unspecified: Secondary | ICD-10-CM | POA: Diagnosis not present

## 2022-04-14 DIAGNOSIS — E041 Nontoxic single thyroid nodule: Secondary | ICD-10-CM | POA: Diagnosis not present

## 2022-04-14 DIAGNOSIS — K7689 Other specified diseases of liver: Secondary | ICD-10-CM | POA: Diagnosis not present

## 2022-04-14 DIAGNOSIS — Z8673 Personal history of transient ischemic attack (TIA), and cerebral infarction without residual deficits: Secondary | ICD-10-CM | POA: Diagnosis not present

## 2022-04-14 DIAGNOSIS — C4321 Malignant melanoma of right ear and external auricular canal: Secondary | ICD-10-CM | POA: Diagnosis not present

## 2022-04-14 DIAGNOSIS — Z85820 Personal history of malignant melanoma of skin: Secondary | ICD-10-CM | POA: Diagnosis not present

## 2022-04-14 DIAGNOSIS — R918 Other nonspecific abnormal finding of lung field: Secondary | ICD-10-CM | POA: Diagnosis not present

## 2022-04-14 DIAGNOSIS — Z9889 Other specified postprocedural states: Secondary | ICD-10-CM | POA: Diagnosis not present

## 2022-04-14 DIAGNOSIS — Z08 Encounter for follow-up examination after completed treatment for malignant neoplasm: Secondary | ICD-10-CM | POA: Diagnosis not present

## 2022-04-14 NOTE — Progress Notes (Signed)
Office Visit Note  Patient: Tyler Norris             Date of Birth: 02/22/1935           MRN: [id number]             PCP: [name], MD Referring: [name], MD Visit Date: 04/28/2022 Occupation: '@GUAROCC'$ @  Subjective:  Medication Maintenance   History of Present Illness: Tyler Norris is a 88 y.o. male with history of systemic lupus and osteoarthritis. He continues to have pain and swelling in the left knee. The pain is worse at the end of the day. He has difficulty walking up stairs and up inclines. He continues to take hydroxychloroquine 200 mg BID M-F and has not missed any doses. He was evaluated by ophthalmology on 08/05/2021. He continues to have dry mouth and dry eyes. He denies any hand swelling, oral ulcers, nasal ulcers , malar rash, photosensitivity or lymphadenopathy.   Activities of Daily Living:  Patient reports morning stiffness for 0 minute.   Patient Denies nocturnal pain.  Difficulty dressing/grooming: Denies Difficulty climbing stairs: Denies Difficulty getting out of chair: Denies Difficulty using hands for taps, buttons, cutlery, and/or writing: Denies  Review of Systems  Constitutional:  Negative for fatigue.  HENT:  Positive for mouth dryness. Negative for mouth sores.   Eyes:  Positive for dryness.  Respiratory:  Negative for shortness of breath.   Cardiovascular:  Negative for chest pain and palpitations.  Gastrointestinal:  Negative for blood in stool, constipation and diarrhea.  Endocrine: Negative for increased urination.  Genitourinary:  Positive for involuntary urination.  Musculoskeletal:  Positive for joint pain, gait problem, joint pain and joint swelling. Negative for myalgias, muscle weakness, morning stiffness, muscle tenderness and myalgias.  Skin:  Negative for color change, rash, hair loss and sensitivity to sunlight.  Allergic/Immunologic: Negative for susceptible to infections.  Neurological:  Negative for dizziness  and headaches.  Hematological:  Negative for swollen glands.  Psychiatric/Behavioral:  Negative for depressed mood and sleep disturbance. The patient is not nervous/anxious.     PMFS History:  Patient Active Problem List   Diagnosis Date Noted   Osteoarthritis of left knee 04/28/2022   History of stroke 09/01/2020   Aortic atherosclerosis ([place]) 09/01/2020   Pain in joint of right ankle 03/27/2017   SDH (subdural hematoma) ([place]) 01/08/2017   Dysphonia 11/15/2015   Aneurysm of thoracic aorta (HCC) 06/02/2015   SOB (shortness of breath) 08/22/2014   Lupus (systemic lupus erythematosus) ([place])    Positive QuantiFERON-TB Gold test 07/05/2012   Hypertension associated with diabetes ([place]) 05/13/2012   OSA on CPAP 05/13/2012   AAA (abdominal aortic aneurysm) without rupture ([place]) 06/27/2011    Past Medical History:  Diagnosis Date   AAA (abdominal aortic aneurysm) (HCC)    a. resolved by ct scan 09-2011   Anemia    Arthritis    Barrett's esophagus    Carpal tunnel syndrome, bilateral    Essential hypertension    Fibromyalgia    GERD (gastroesophageal reflux disease)    History of pneumonia    Hyperlipidemia    Lupus (systemic lupus erythematosus) (HCC)    Nephrolithiasis    Pericardial effusion    a. 08/22/2014 Echo: EF 60-65%, mildly dil Ao root (78m), mildly dil LA, mod-large partially organized pericardial effusion with possible early tamponade - asymptomatic on high dose nsaids-->conservative mgmt;  b. 08/2014 Limited Echo: EF 55-60%, Triv - small effusion.   Sleep apnea  a. uses cpap every night    Family History  Problem Relation Age of Onset   Diabetes Mother    Diabetes Sister    Stroke Sister    Healthy Daughter    Healthy Son    Past Surgical History:  Procedure Laterality Date   APPENDECTOMY     CARPAL TUNNEL RELEASE  10/12/2011   Procedure: CARPAL TUNNEL RELEASE;  Surgeon: Cammie Sickle., MD;  Location: [facility];  Service: Orthopedics;   Laterality: Left;   CARPAL TUNNEL RELEASE  12/01/2011   Procedure: CARPAL TUNNEL RELEASE;  Surgeon: Cammie Sickle., MD;  Location: [facility];  Service: Orthopedics;  Laterality: Right;   CYSTOSCOPY W/ STONE MANIPULATION  2001,2009   HAND SURGERY     scar repaired lt hand   HERNIA REPAIR  1966   IR ANGIO INTRA EXTRACRAN SEL INTERNAL CAROTID BILAT MOD SED  07/09/2020   IR ANGIO VERTEBRAL SEL SUBCLAVIAN INNOMINATE UNI R MOD SED  07/09/2020   IR ANGIO VERTEBRAL SEL VERTEBRAL UNI L MOD SED  07/09/2020   IR US GUIDE VASC ACCESS RIGHT  07/09/2020   KNEE ARTHROPLASTY     KNEE ARTHROSCOPY Left    TONSILLECTOMY     Social History   Social History Narrative   Not on file   Immunization History  Administered Date(s) Administered   Fluad Quad(high Dose 65+) 01/16/2019, 10/28/2019   Influenza Nasal 11/28/2011   Influenza Split 11/27/2013, 11/28/2014   Influenza,inj,Quad PF,6+ Mos 11/11/2012, 10/29/2015   Influenza-Unspecified 11/16/2014   Moderna Sars-Covid-2 Vaccination 03/12/2019, 04/08/2019, 10/27/2019, 06/21/2020   Pneumococcal Polysaccharide-23 05/15/2012   Tdap 01/07/2017   Zoster Recombinat (Shingrix) 01/16/2018, 03/17/2018     Objective: Vital Signs: BP 118/72 (BP Location: Left Arm, Patient Position: Sitting, Cuff Size: Normal)   Pulse (!) 49   Resp 16   Ht 5' 9.5" (1.765 m)   Wt 200 lb (90.7 kg)   BMI 29.11 kg/m    Physical Exam Vitals and nursing note reviewed.  Constitutional:      Appearance: He is well-developed.  HENT:     Head: Normocephalic and atraumatic.  Eyes:     Conjunctiva/sclera: Conjunctivae normal.     Pupils: Pupils are equal, round, and reactive to light.  Cardiovascular:     Rate and Rhythm: Normal rate and regular rhythm.     Heart sounds: Normal heart sounds.  Pulmonary:     Effort: Pulmonary effort is normal.     Breath sounds: Normal breath sounds.  Abdominal:     General: Bowel sounds are normal.     Palpations: Abdomen is  soft.  Musculoskeletal:     Cervical back: Normal range of motion and neck supple.  Skin:    General: Skin is warm and dry.     Capillary Refill: Capillary refill takes less than 2 seconds.  Neurological:     Mental Status: He is alert and oriented to person, place, and time.  Psychiatric:        Behavior: Behavior normal.      Musculoskeletal Exam: Cervical spine has limited range of motion. Shoulder joints have limited range of motion. Elbow joints, wrist joints, MCPs PIPs and DIPs have good range of motion with no synovitis, warmth or effusion. Bilateral synovial thickening of MCPs and bilateral DIP and PIP thickening. Hip joints, knee joints, ankles, MTPs and PIPs with good range of motion.  No warmth swelling or effusion was noted.  CDAI Exam: CDAI Score: --  Patient Global: --; Provider Global: -- Swollen: --; Tender: -- Joint Exam 04/28/2022   No joint exam has been documented for this visit   There is currently no information documented on the homunculus. Go to the Rheumatology activity and complete the homunculus joint exam.  Investigation: No additional findings.  Imaging: No results found.  Recent Labs: Lab Results  Component Value Date   WBC 4.9 11/22/2021   HGB 12.7 (L) 11/22/2021   PLT 197 11/22/2021   NA 141 11/22/2021   K 4.0 11/22/2021   CL 103 11/22/2021   CO2 28 11/22/2021   GLUCOSE 211 (H) 11/22/2021   BUN 22 11/22/2021   CREATININE 1.00 11/22/2021   BILITOT 0.7 11/22/2021   ALKPHOS 33 (L) 07/07/2020   AST 18 11/22/2021   ALT 13 11/22/2021   PROT 6.4 11/22/2021   ALBUMIN 4.5 07/07/2020   CALCIUM 9.6 11/22/2021   GFRAA 87 03/04/2020    Speciality Comments: PLQ Eye Exam: 08/05/2021 WNL @ [facility] Follow up in 1 year.  Procedures:  No procedures performed Allergies: Atorvastatin and Lipitor [atorvastatin calcium]   Assessment / Plan:     Visit Diagnoses: Other systemic lupus erythematosus with other organ involvement (HCC) -  +ANA, +RF:history of oral ulcers, nasal ulcers, malar rash, photosensitivity or sicca symptoms. Today he denies any nasal ulcers, oral ulcers, malar rash, photosensitivity or lymphadenopathy. No evidence of lupus flare on exam today. He continues to take hydroxychloroquine 200 mg BID M-F and has not missed any doses. Last plaquenil eye exam was on 08/05/2021. Will repeat sedimentation rate, C3 and C4, anti-DNA double stranded, CBC, CMP with GFR, protein/creatinine ratio.   High risk medication use - Plaquenil 200 mg twice daily Monday to Friday. PLQ Eye Exam 08/05/2021. He has not missed any doses of the medication. Will repeat CBC with diff and CMP with GFR today.  Information on immunization was placed in the AVS.  Osteoarthritis of left knee - Patient complains of worsening left knee pain and swelling. Worse with activity and he has difficulty climbing stairs and walking up inclines because of the pain.  No warmth swelling or effusion was noted on the examination.  X-ray of left knee in 06/07/2021 revealed severe osteoarthritis and severe chondromalacia patella.  X-ray findings were reviewed with the patient.   I had a long discussion with patient today of options of Visco supplement injections versus total knee replacement. He is not a candidate for steroid injection because of history of uncontrolled diabetes mellitus. Patient decided to proceed with Visco supplement injections. Will begin application process for Visco and notify patient if it is approved.  Patient is also concerned about the cost of the viscosupplement injections which may not be covered by the insurance.  He will contact his insurance and will let us know if he wants to proceed with the viscosupplement injections.  This patient is diagnosed with osteoarthritis of the knee(s).    Radiographs show evidence of joint space narrowing, osteophytes, subchondral sclerosis and/or subchondral cysts.  This patient has knee pain which interferes  with functional and activities of daily living.    This patient has experienced inadequate response, adverse effects and/or intolerance with conservative treatments such as acetaminophen, NSAIDS, topical creams, physical therapy or regular exercise, knee bracing and/or weight loss.   This patient has experienced inadequate response or has a contraindication to intra articular steroid injections for at least 3 months.   This patient is not scheduled to have a total knee replacement within  6 months of starting treatment with viscosupplementation.    Other proteinuria - Will check protein creatinine ratio today.   History of pleural effusion - no recurrence   History of pulmonary embolism   Thoracic aortic aneurysm without rupture, unspecified part ([place])   Cerebrovascular accident (CVA) due to other mechanism ([place]) - 06/2020-loss of left eye peripheral vision.  He has not recovered his vision.  He also complains of memory loss.  Malignant melanoma of right ear [facility]) - Diagnosed in December 2022  Essential hypertension - Blood pressure of 118/72 in office today.   History of gastroesophageal reflux (GERD)  Positive QuantiFERON-TB Gold test - 2019  OSA on CPAP  History of sleep apnea  Orders: Orders Placed This Encounter  Procedures   Protein / creatinine ratio, urine   CBC with Differential/Platelet   COMPLETE METABOLIC PANEL WITH GFR   Anti-DNA antibody, double-stranded   C3 and C4   Sedimentation rate   No orders of the defined types were placed in this encounter.   Face-to-face time spent with patient was 30 minutes. Greater than 50% of time was spent in counseling and coordination of care.  Follow-Up Instructions: Return in about 5 months (around 09/28/2022) for Systemic lupus.   Bo Merino, MD  Note - This record has been created using Editor, commissioning.  Chart creation errors have been sought, but may not always  have been located. Such creation errors do not  reflect on  the standard of medical care.

## 2022-04-28 ENCOUNTER — Encounter: Payer: Self-pay | Admitting: Rheumatology

## 2022-04-28 ENCOUNTER — Ambulatory Visit: Payer: Medicare Other | Attending: Rheumatology | Admitting: Rheumatology

## 2022-04-28 ENCOUNTER — Telehealth: Payer: Self-pay | Admitting: Rheumatology

## 2022-04-28 VITALS — BP 118/72 | HR 49 | Resp 16 | Ht 69.5 in | Wt 200.0 lb

## 2022-04-28 DIAGNOSIS — Z8719 Personal history of other diseases of the digestive system: Secondary | ICD-10-CM

## 2022-04-28 DIAGNOSIS — M1712 Unilateral primary osteoarthritis, left knee: Secondary | ICD-10-CM | POA: Diagnosis not present

## 2022-04-28 DIAGNOSIS — R7612 Nonspecific reaction to cell mediated immunity measurement of gamma interferon antigen response without active tuberculosis: Secondary | ICD-10-CM

## 2022-04-28 DIAGNOSIS — Z8709 Personal history of other diseases of the respiratory system: Secondary | ICD-10-CM

## 2022-04-28 DIAGNOSIS — B370 Candidal stomatitis: Secondary | ICD-10-CM | POA: Diagnosis not present

## 2022-04-28 DIAGNOSIS — R059 Cough, unspecified: Secondary | ICD-10-CM | POA: Diagnosis not present

## 2022-04-28 DIAGNOSIS — R808 Other proteinuria: Secondary | ICD-10-CM

## 2022-04-28 DIAGNOSIS — Z8669 Personal history of other diseases of the nervous system and sense organs: Secondary | ICD-10-CM

## 2022-04-28 DIAGNOSIS — M3219 Other organ or system involvement in systemic lupus erythematosus: Secondary | ICD-10-CM

## 2022-04-28 DIAGNOSIS — I10 Essential (primary) hypertension: Secondary | ICD-10-CM | POA: Diagnosis not present

## 2022-04-28 DIAGNOSIS — C4321 Malignant melanoma of right ear and external auricular canal: Secondary | ICD-10-CM

## 2022-04-28 DIAGNOSIS — G4733 Obstructive sleep apnea (adult) (pediatric): Secondary | ICD-10-CM

## 2022-04-28 DIAGNOSIS — Z79899 Other long term (current) drug therapy: Secondary | ICD-10-CM

## 2022-04-28 DIAGNOSIS — Z86711 Personal history of pulmonary embolism: Secondary | ICD-10-CM | POA: Diagnosis not present

## 2022-04-28 DIAGNOSIS — I712 Thoracic aortic aneurysm, without rupture, unspecified: Secondary | ICD-10-CM

## 2022-04-28 DIAGNOSIS — I6389 Other cerebral infarction: Secondary | ICD-10-CM

## 2022-04-28 DIAGNOSIS — J069 Acute upper respiratory infection, unspecified: Secondary | ICD-10-CM | POA: Diagnosis not present

## 2022-04-28 DIAGNOSIS — Z6828 Body mass index (BMI) 28.0-28.9, adult: Secondary | ICD-10-CM | POA: Diagnosis not present

## 2022-04-28 NOTE — Telephone Encounter (Signed)
Please call to schedule visco injections.  Approved for Orthovisc, Left knee(s). Buy & Bill Secondary plan does not follow medicare guidelines. It will pick up remaining eligible expenses at 100%. Deductible does apply No pre-certifications

## 2022-04-28 NOTE — Telephone Encounter (Signed)
-----   Message from Shona Needles, RT sent at 04/28/2022 10:14 AM EST ----- Regarding: LEFT KNEE VISCO W/[place] - diabetic

## 2022-04-28 NOTE — Patient Instructions (Signed)
Vaccines You are taking a medication(s) that can suppress your immune system.  The following immunizations are recommended: Flu annually Covid-19  Td/Tdap (tetanus, diphtheria, pertussis) every 10 years Pneumonia (Prevnar 15 then Pneumovax 23 at least 1 year apart.  Alternatively, can take Prevnar 20 without needing additional dose) Shingrix: 2 doses from 4 weeks to 6 months apart  Please check with your PCP to make sure you are up to date.

## 2022-04-29 LAB — COMPLETE METABOLIC PANEL WITH GFR
AG Ratio: 2.1 (calc) (ref 1.0–2.5)
ALT: 16 U/L (ref 9–46)
AST: 19 U/L (ref 10–35)
Albumin: 4.5 g/dL (ref 3.6–5.1)
Alkaline phosphatase (APISO): 40 U/L (ref 35–144)
BUN/Creatinine Ratio: 20 (calc) (ref 6–22)
BUN: 25 mg/dL (ref 7–25)
CO2: 26 mmol/L (ref 20–32)
Calcium: 9.7 mg/dL (ref 8.6–10.3)
Chloride: 102 mmol/L (ref 98–110)
Creat: 1.24 mg/dL — ABNORMAL HIGH (ref 0.70–1.22)
Globulin: 2.1 g/dL (calc) (ref 1.9–3.7)
Glucose, Bld: 223 mg/dL — ABNORMAL HIGH (ref 65–99)
Potassium: 4.1 mmol/L (ref 3.5–5.3)
Sodium: 140 mmol/L (ref 135–146)
Total Bilirubin: 0.8 mg/dL (ref 0.2–1.2)
Total Protein: 6.6 g/dL (ref 6.1–8.1)
eGFR: 57 mL/min/{1.73_m2} — ABNORMAL LOW (ref >=60)

## 2022-04-29 LAB — CBC WITH DIFFERENTIAL/PLATELET
Absolute Monocytes: 664 cells/uL (ref 200–950)
Basophils Absolute: 50 cells/uL (ref 0–200)
Basophils Relative: 1.2 %
Eosinophils Absolute: 189 cells/uL (ref 15–500)
Eosinophils Relative: 4.5 %
HCT: 37.5 % — ABNORMAL LOW (ref 38.5–50.0)
Hemoglobin: 12.6 g/dL — ABNORMAL LOW (ref 13.2–17.1)
Lymphs Abs: 1113 cells/uL (ref 850–3900)
MCH: 30.4 pg (ref 27.0–33.0)
MCHC: 33.6 g/dL (ref 32.0–36.0)
MCV: 90.4 fL (ref 80.0–100.0)
MPV: 11.1 fL (ref 7.5–12.5)
Monocytes Relative: 15.8 %
Neutro Abs: 2184 cells/uL (ref 1500–7800)
Neutrophils Relative %: 52 %
Platelets: 168 10*3/uL (ref 140–400)
RBC: 4.15 10*6/uL — ABNORMAL LOW (ref 4.20–5.80)
RDW: 11.7 % (ref 11.0–15.0)
Total Lymphocyte: 26.5 %
WBC: 4.2 10*3/uL (ref 3.8–10.8)

## 2022-04-29 LAB — PROTEIN / CREATININE RATIO, URINE
Creatinine, Urine: 194 mg/dL (ref 20–320)
Protein/Creat Ratio: 77 mg/g creat (ref 25–148)
Protein/Creatinine Ratio: 0.077 mg/mg creat (ref 0.025–0.148)
Total Protein, Urine: 15 mg/dL (ref 5–25)

## 2022-04-29 LAB — ANTI-DNA ANTIBODY, DOUBLE-STRANDED: ds DNA Ab: 2 IU/mL

## 2022-04-29 LAB — C3 AND C4
C3 Complement: 143 mg/dL
C4 Complement: 25 mg/dL

## 2022-04-29 LAB — SEDIMENTATION RATE: Sed Rate: 9 mm/h (ref 0–20)

## 2022-04-30 NOTE — Progress Notes (Signed)
Hemoglobin is low and stable.  Glucose is elevated at 223, creatinine is elevated at 1.24 most likely due to diabetes.  Protein creatinine ratio is normal.  Double stranded DNA negative, sed rate normal, complements normal.  Labs do not indicate a lupus flare.  Patient should avoid all NSAIDs.  Please forward results to his PCP.

## 2022-05-03 NOTE — Telephone Encounter (Signed)
LMOM for patient to call and schedule Visco knee injections.

## 2022-05-12 ENCOUNTER — Other Ambulatory Visit: Payer: Self-pay | Admitting: Rheumatology

## 2022-05-12 NOTE — Telephone Encounter (Signed)
Next Visit: 09/28/2022  Last Visit: 04/28/2022  Labs: 04/28/2022  Hemoglobin is low and stable.  Glucose is elevated at 223, creatinine is elevated at 1.24 most likely due to diabetes.  Protein creatinine ratio is normal.  Double stranded DNA negative, sed rate normal, complements normal.  Labs do not indicate a lupus flare.  Patient should avoid all NSAIDs.  Please forward results to his PCP.   Eye exam: 08/05/2021   Current Dose per office note 04/28/2022: Plaquenil 200 mg twice daily Monday to Friday   DX: Other systemic lupus erythematosus with other organ involvement    Last Fill: 01/25/2022  Okay to refill Plaquenil?

## 2022-05-17 DIAGNOSIS — H2513 Age-related nuclear cataract, bilateral: Secondary | ICD-10-CM | POA: Diagnosis not present

## 2022-05-17 DIAGNOSIS — H5203 Hypermetropia, bilateral: Secondary | ICD-10-CM | POA: Diagnosis not present

## 2022-05-17 DIAGNOSIS — H5340 Unspecified visual field defects: Secondary | ICD-10-CM | POA: Diagnosis not present

## 2022-06-05 ENCOUNTER — Ambulatory Visit: Payer: Medicare Other | Attending: Physician Assistant | Admitting: Physician Assistant

## 2022-06-05 DIAGNOSIS — M1712 Unilateral primary osteoarthritis, left knee: Secondary | ICD-10-CM | POA: Diagnosis not present

## 2022-06-05 MED ORDER — HYALURONAN 30 MG/2ML IX SOSY
30.0000 mg | PREFILLED_SYRINGE | INTRA_ARTICULAR | Status: AC | PRN
  Administered 2022-06-05: 30 mg via INTRA_ARTICULAR

## 2022-06-05 MED ORDER — LIDOCAINE HCL 1 % IJ SOLN
1.5000 mL | INTRAMUSCULAR | Status: AC | PRN
  Administered 2022-06-05: 1.5 mL

## 2022-06-05 NOTE — Progress Notes (Signed)
   Procedure Note  Patient: Tyler Norris             Date of Birth: 08-30-1934           MRN: [id number]             Visit Date: 06/05/2022  Procedures: Visit Diagnoses:  Orthovisc #1 left knee, B/B 1. Primary osteoarthritis of left knee    Orthovisc #1 left knee injection  Large Joint Inj: L knee on 06/05/2022 1:19 PM Indications: pain Details: 25 G 1.5 in needle, medial approach  Arthrogram: No  Medications: 1.5 mL lidocaine 1 %; 30 mg Hyaluronan 30 MG/2ML Aspirate: 0 mL Outcome: tolerated well, no immediate complications Procedure, treatment alternatives, risks and benefits explained, specific risks discussed. Consent was given by the patient. Immediately prior to procedure a time out was called to verify the correct patient, procedure, equipment, support staff and site/side marked as required. Patient was prepped and draped in the usual sterile fashion.      Patient tolerated the procedure well.  Aftercare was discussed.  Sherron Ales, PA-C

## 2022-06-12 ENCOUNTER — Ambulatory Visit: Payer: Medicare Other | Attending: Physician Assistant | Admitting: Physician Assistant

## 2022-06-12 DIAGNOSIS — M1712 Unilateral primary osteoarthritis, left knee: Secondary | ICD-10-CM | POA: Insufficient documentation

## 2022-06-12 MED ORDER — HYALURONAN 30 MG/2ML IX SOSY
30.0000 mg | PREFILLED_SYRINGE | INTRA_ARTICULAR | Status: AC | PRN
  Administered 2022-06-12: 30 mg via INTRA_ARTICULAR

## 2022-06-12 MED ORDER — LIDOCAINE HCL 1 % IJ SOLN
1.5000 mL | INTRAMUSCULAR | Status: AC | PRN
Start: 2022-06-12 — End: 2022-06-12
  Administered 2022-06-12: 1.5 mL

## 2022-06-12 NOTE — Progress Notes (Signed)
   Procedure Note  Patient: Tyler Norris             Date of Birth: 1934-10-09           MRN: [id number]             Visit Date: 06/12/2022  Procedures: Visit Diagnoses:  1. Primary osteoarthritis of left knee    Orthovisc #2 left knee, B/B Large Joint Inj: L knee on 06/12/2022 2:30 PM Indications: pain Details: 25 G 1.5 in needle, medial approach  Arthrogram: No  Medications: 1.5 mL lidocaine 1 %; 30 mg Hyaluronan 30 MG/2ML Aspirate: 0 mL Outcome: tolerated well, no immediate complications Procedure, treatment alternatives, risks and benefits explained, specific risks discussed. Consent was given by the patient. Immediately prior to procedure a time out was called to verify the correct patient, procedure, equipment, support staff and site/side marked as required. Patient was prepped and draped in the usual sterile fashion.     Patient tolerated the procedure well.  Aftercare was discussed. Sherron Ales, PA-C

## 2022-06-19 ENCOUNTER — Ambulatory Visit: Payer: Medicare Other | Attending: Physician Assistant | Admitting: Physician Assistant

## 2022-06-19 DIAGNOSIS — M1712 Unilateral primary osteoarthritis, left knee: Secondary | ICD-10-CM | POA: Insufficient documentation

## 2022-06-19 MED ORDER — HYALURONAN 30 MG/2ML IX SOSY
30.0000 mg | PREFILLED_SYRINGE | INTRA_ARTICULAR | Status: AC | PRN
Start: 2022-06-19 — End: 2022-06-19
  Administered 2022-06-19: 30 mg via INTRA_ARTICULAR

## 2022-06-19 MED ORDER — LIDOCAINE HCL 1 % IJ SOLN
1.5000 mL | INTRAMUSCULAR | Status: AC | PRN
Start: 2022-06-19 — End: 2022-06-19
  Administered 2022-06-19: 1.5 mL

## 2022-06-19 NOTE — Progress Notes (Signed)
   Procedure Note  Patient: Tyler Norris             Date of Birth: May 19, 1934           MRN: [id number]             Visit Date: 06/19/2022  Procedures: Visit Diagnoses:  1. Primary osteoarthritis of left knee    Orthovisc #3 left knee, B/B Large Joint Inj: L knee on 06/19/2022 11:42 AM Indications: pain Details: 25 G 1.5 in needle, medial approach  Arthrogram: No  Medications: 1.5 mL lidocaine 1 %; 30 mg Hyaluronan 30 MG/2ML Aspirate: 0 mL Outcome: tolerated well, no immediate complications Procedure, treatment alternatives, risks and benefits explained, specific risks discussed. Consent was given by the patient. Immediately prior to procedure a time out was called to verify the correct patient, procedure, equipment, support staff and site/side marked as required. Patient was prepped and draped in the usual sterile fashion.      Patient tolerated the procedure well.  Aftercare was discussed.  Sherron Ales, PA-C

## 2022-06-23 DIAGNOSIS — I129 Hypertensive chronic kidney disease with stage 1 through stage 4 chronic kidney disease, or unspecified chronic kidney disease: Secondary | ICD-10-CM | POA: Diagnosis not present

## 2022-06-23 DIAGNOSIS — M329 Systemic lupus erythematosus, unspecified: Secondary | ICD-10-CM | POA: Diagnosis not present

## 2022-06-23 DIAGNOSIS — E1122 Type 2 diabetes mellitus with diabetic chronic kidney disease: Secondary | ICD-10-CM | POA: Diagnosis not present

## 2022-06-23 DIAGNOSIS — N182 Chronic kidney disease, stage 2 (mild): Secondary | ICD-10-CM | POA: Diagnosis not present

## 2022-06-23 DIAGNOSIS — N2581 Secondary hyperparathyroidism of renal origin: Secondary | ICD-10-CM | POA: Diagnosis not present

## 2022-07-04 DIAGNOSIS — D225 Melanocytic nevi of trunk: Secondary | ICD-10-CM | POA: Diagnosis not present

## 2022-07-04 DIAGNOSIS — D170 Benign lipomatous neoplasm of skin and subcutaneous tissue of head, face and neck: Secondary | ICD-10-CM | POA: Diagnosis not present

## 2022-07-04 DIAGNOSIS — L570 Actinic keratosis: Secondary | ICD-10-CM | POA: Diagnosis not present

## 2022-07-04 DIAGNOSIS — Z85820 Personal history of malignant melanoma of skin: Secondary | ICD-10-CM | POA: Diagnosis not present

## 2022-07-04 DIAGNOSIS — L821 Other seborrheic keratosis: Secondary | ICD-10-CM | POA: Diagnosis not present

## 2022-07-04 DIAGNOSIS — L814 Other melanin hyperpigmentation: Secondary | ICD-10-CM | POA: Diagnosis not present

## 2022-07-04 DIAGNOSIS — Z86006 Personal history of melanoma in-situ: Secondary | ICD-10-CM | POA: Diagnosis not present

## 2022-07-04 DIAGNOSIS — Z08 Encounter for follow-up examination after completed treatment for malignant neoplasm: Secondary | ICD-10-CM | POA: Diagnosis not present

## 2022-08-07 DIAGNOSIS — H25813 Combined forms of age-related cataract, bilateral: Secondary | ICD-10-CM | POA: Diagnosis not present

## 2022-08-07 DIAGNOSIS — H5340 Unspecified visual field defects: Secondary | ICD-10-CM | POA: Diagnosis not present

## 2022-08-07 DIAGNOSIS — Z79899 Other long term (current) drug therapy: Secondary | ICD-10-CM | POA: Diagnosis not present

## 2022-08-07 DIAGNOSIS — H31001 Unspecified chorioretinal scars, right eye: Secondary | ICD-10-CM | POA: Diagnosis not present

## 2022-08-23 ENCOUNTER — Telehealth: Payer: Self-pay | Admitting: Rheumatology

## 2022-08-23 ENCOUNTER — Other Ambulatory Visit: Payer: Self-pay | Admitting: Physician Assistant

## 2022-08-23 NOTE — Telephone Encounter (Signed)
Please advise an appointment with orthopedics.

## 2022-08-23 NOTE — Telephone Encounter (Signed)
Last Fill: 05/16/2022  Eye exam: 08/05/2021    Labs: 04/28/2022 Hemoglobin is low and stable.  Glucose is elevated at 223, creatinine is elevated at 1.24 most likely due to diabetes.  Protein creatinine ratio is normal.  Double stranded DNA negative, sed rate normal, complements normal.     Next Visit: 09/08/2022  Last Visit: 04/28/2022  DX: Other systemic lupus erythematosus with other organ involvement   Current Dose per office note 04/28/2022: Plaquenil 200 mg twice daily Monday to Friday.   Okay to refill Plaquenil?

## 2022-08-23 NOTE — Telephone Encounter (Signed)
Patient advised to make an appointment with orthopedics.

## 2022-08-23 NOTE — Telephone Encounter (Signed)
"  Tyler Norris" called requesting an urgent appointment with Dr. Corliss Skains. He stated he is having pain in both arm joints. He can only hold less than 5 pounds in his right arm but his left arm is not quite as bad. Patient would like to discuss his symptoms and be seen sooner if possible. He was added to the wait list.

## 2022-08-23 NOTE — Telephone Encounter (Signed)
Returned call to patient. He states he has been having discomfort in his right arm for about 2.5-3 months now. He states he does not recall any injury. Patient states one day a couple of months ago he "felt like something may have pulled loose." Patient states he does not have pain it is just discomfort and it starts around the shoulder. Patient states the left arm has become uncomfortable as well but the right arm is worse. Patient states he has trouble lifting more than 5 pounds. Patient states pushing or pulling are also difficult. Patient denies any numbness or tingling. Patient states he is limited in what he can do. Patient states it does hurt to sleep on his right side. Nothing helps it or makes it better except lifting it with the left arm. Please advise.

## 2022-08-24 ENCOUNTER — Ambulatory Visit (INDEPENDENT_AMBULATORY_CARE_PROVIDER_SITE_OTHER): Payer: Medicare Other | Admitting: Physician Assistant

## 2022-08-24 ENCOUNTER — Ambulatory Visit (INDEPENDENT_AMBULATORY_CARE_PROVIDER_SITE_OTHER): Payer: Medicare Other | Admitting: Sports Medicine

## 2022-08-24 ENCOUNTER — Encounter: Payer: Self-pay | Admitting: Physician Assistant

## 2022-08-24 ENCOUNTER — Other Ambulatory Visit: Payer: Self-pay

## 2022-08-24 ENCOUNTER — Ambulatory Visit: Payer: Self-pay

## 2022-08-24 DIAGNOSIS — M25511 Pain in right shoulder: Secondary | ICD-10-CM

## 2022-08-24 DIAGNOSIS — M19011 Primary osteoarthritis, right shoulder: Secondary | ICD-10-CM | POA: Diagnosis not present

## 2022-08-24 MED ORDER — METHYLPREDNISOLONE ACETATE 80 MG/ML IJ SUSP
40.0000 mg | INTRAMUSCULAR | Status: AC | PRN
Start: 2022-08-24 — End: 2022-08-24
  Administered 2022-08-24: 40 mg via INTRA_ARTICULAR

## 2022-08-24 MED ORDER — LIDOCAINE HCL 1 % IJ SOLN
2.0000 mL | INTRAMUSCULAR | Status: AC | PRN
Start: 2022-08-24 — End: 2022-08-24
  Administered 2022-08-24: 2 mL

## 2022-08-24 MED ORDER — BUPIVACAINE HCL 0.25 % IJ SOLN
2.0000 mL | INTRAMUSCULAR | Status: AC | PRN
Start: 2022-08-24 — End: 2022-08-24
  Administered 2022-08-24: 2 mL via INTRA_ARTICULAR

## 2022-08-24 NOTE — Progress Notes (Signed)
Office Visit Note   Patient: Tyler Norris           Date of Birth: [birth year]           MRN: [id number] Visit Date: 08/24/2022              Requested by: Henrine Screws, MD [address] [place],  [place] [address] PCP: Henrine Screws, MD   Assessment & Plan: Visit Diagnoses:  1. Right shoulder pain, unspecified chronicity   2. Primary osteoarthritis, right shoulder     Plan: Pleasant 88 year old gentleman comes in today with a 2-1/11-month history of right shoulder pain and stiffness.  Denies any particular injuries but has been active his whole life.  He does enjoy lifting weights.  He denies any ecchymosis redness fever or chills.  X-rays today demonstrate advanced degenerative changes of both the [facility] joint and the glenohumeral joint.  On exam today he has quite a bit of stiffness with external rotation and this reproduces some of his deep pain.  I explained to him that certainly both of these joints could be the source of his pain but I think it would be appropriate to start the glenohumeral joint.  I have referred him to Dr. Shon Baton who will inject his glenohumeral joint today.  I told him to follow-up with me in a few weeks and I be happy if he still had residual pain to inject the subacromial space.  At that time he also wants to talk about some long time swelling from a previous injury in his left ankle.  Follow-Up Instructions: 2 weeks  Orders:  Orders Placed This Encounter  Procedures   XR Shoulder Right   No orders of the defined types were placed in this encounter.     Procedures: No procedures performed   Clinical Data: No additional findings.   Subjective: Chief Complaint  Patient presents with   Right Shoulder - Pain    HPI pleasant 88 year old gentleman with 2-1/66-month history of right shoulder pain.  Denies any injuries has had multiple injuries to joints in the past but does not recollect 1 particular to the shoulder.  He has tried treating this  symptomatically but has had minimal improvement.  He also complains of stiffness in the shoulder.  Rates his pain to mild or moderate.  Review of Systems  All other systems reviewed and are negative.    Objective: Vital Signs: There were no vitals taken for this visit.  Physical Exam Constitutional:      Appearance: Normal appearance.  Pulmonary:     Effort: Pulmonary effort is normal.  Skin:    General: Skin is warm and dry.  Neurological:     General: No focal deficit present.     Mental Status: He is alert.     Ortho Exam Patient appears well.  He has baseline twitching in his face and loss of peripheral vision on his left eye secondary to his stroke and this is baseline for him.  With regards to his right shoulder he can go overhead with the right shoulder he lacks about 10 to 15 degrees of full forward extension.  He does have difficulty internally rotating his arm behind his back.  He has mild empty can test.  Negative speeds sign.  He does have tenderness over the [facility] joint but no redness ecchymosis.  He is very stiff with external rotation it reproduces quite a bit of his pain.  Strength is fair with resisted  abduction external rotation internal rotation and triceps and bicep strengthening.  Grip strength is good no paresthesias Specialty Comments:  No specialty comments available.  Imaging: No results found.   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 resolved by ct scan 09-2011   Anemia    Arthritis    Barrett's esophagus    Carpal tunnel syndrome, bilateral    Essential hypertension    Fibromyalgia    GERD (gastroesophageal reflux disease)    History of pneumonia    Hyperlipidemia    Lupus (systemic lupus erythematosus) (HCC)    Nephrolithiasis    Pericardial effusion    a. 08/22/2014 Echo: EF 60-65%, mildly dil Ao root (40mm), mildly dil LA, mod-large partially organized pericardial effusion with possible early tamponade - asymptomatic on high dose nsaids-->conservative mgmt;  b. 08/2014 Limited Echo: EF 55-60%, Triv - small effusion.   Sleep apnea    a. uses cpap every night    Family History  Problem Relation Age of Onset   Diabetes Mother    Diabetes Sister    Stroke Sister    Healthy Daughter    Healthy Son     Past Surgical History:  Procedure Laterality Date   APPENDECTOMY     CARPAL TUNNEL RELEASE  10/12/2011   Procedure: CARPAL TUNNEL RELEASE;  Surgeon: Wyn Forster., MD;  Location: [facility];  Service: Orthopedics;  Laterality: Left;   CARPAL TUNNEL RELEASE  12/01/2011   Procedure: CARPAL TUNNEL RELEASE;  Surgeon: Wyn Forster., MD;  Location: [facility];  Service: Orthopedics;  Laterality: Right;   CYSTOSCOPY W/ STONE MANIPULATION  2001,2009   HAND SURGERY     scar repaired lt hand   HERNIA REPAIR  1966   IR ANGIO INTRA EXTRACRAN SEL INTERNAL CAROTID BILAT MOD SED  07/09/2020   IR ANGIO VERTEBRAL SEL SUBCLAVIAN INNOMINATE UNI R MOD SED  07/09/2020   IR ANGIO VERTEBRAL SEL VERTEBRAL UNI L MOD SED  07/09/2020   IR US GUIDE VASC ACCESS RIGHT  07/09/2020   KNEE ARTHROPLASTY     KNEE ARTHROSCOPY Left    TONSILLECTOMY     Social History   Occupational History   Occupation: retired     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

## 2022-08-24 NOTE — Progress Notes (Signed)
   Procedure Note  Patient: Tyler Norris             Date of Birth: [birth year]           MRN: [id number]             Visit Date: 08/24/2022  Procedures: Visit Diagnoses:  1. Primary osteoarthritis, right shoulder   2. Right shoulder pain, unspecified chronicity    Large Joint Inj: R glenohumeral on 08/24/2022 2:14 PM Indications: pain Details: 22 G 3.5 in needle, ultrasound-guided posterior approach Medications: 2 mL lidocaine 1 %; 2 mL bupivacaine 0.25 %; 40 mg methylPREDNISolone acetate 80 MG/ML Outcome: tolerated well, no immediate complications  US-guided glenohumeral joint injection, right shoulder After discussion on risks/benefits/indications, informed verbal consent was obtained. A timeout was then performed. The patient was positioned lying lateral recumbent on examination table. The patient's shoulder was prepped with betadine and multiple alcohol swabs and utilizing ultrasound guidance, the patient's glenohumeral joint was identified on ultrasound. Using ultrasound guidance a 22-gauge, 3.5 inch needle with a mixture of 2:2:1 cc's lidocaine:bupivicaine:depomedrol was directed from a lateral to medial direction via in-plane technique into the glenohumeral joint with visualization of appropriate spread of injectate into the joint. Patient tolerated the procedure well without immediate complications.      Procedure, treatment alternatives, risks and benefits explained, specific risks discussed. Consent was given by the patient. Immediately prior to procedure a time out was called to verify the correct patient, procedure, equipment, support staff and site/side marked as required. Patient was prepped and draped in the usual sterile fashion.    - I evaluated the patient about 5 minutes post-injection and he had improvement in pain and range of motion - follow-up with Tyler Norris as indicated; I am happy to see them as needed - discussed if shoulder pain is not significantly  improved, could consider an AC-joint injection in future  Tyler Brunner, DO Primary Care Sports Medicine Physician  [facility] - Orthopedics  This note was dictated using Dragon naturally speaking software and may contain errors in syntax, spelling, or content which have not been identified prior to signing this note.

## 2022-08-25 NOTE — Progress Notes (Signed)
Office Visit Note  Patient: Tyler Norris             Date of Birth: August 28, 1934           MRN: [id number]             PCP: Henrine Screws, MD Referring: Henrine Screws, MD Visit Date: 09/08/2022 Occupation: @GUAROCC @  Subjective:  Medication management  History of Present Illness: Tyler Norris is a 88 y.o. male with history of systemic lupus and osteoarthritis.  Patient states about 3-1/2 months ago he started having pain and discomfort in his right shoulder which gradually got worse and he was having difficulty raising his arm.  He [facility].  He was evaluated and had x-rays.  X-rays showed advanced degenerative changes in the [facility] joint and the glenohumeral joint.  He was referred to Dr. Shon Baton performed an ultrasound-guided right glenohumeral joint injection on August 24, 2022.  He had minimal relief from the injection so far.  He continues to have some discomfort in his left knee and left ankle due to previous injuries.  He had good response to viscosupplement injections which were given in April 2024.  He gives history of dry mouth.  He denies any history of malar rash, oral ulcers, Raynaud's, photosensitivity or lymphadenopathy.    Activities of Daily Living:  Patient reports morning stiffness for a few minutes.   Patient Reports nocturnal pain.  Difficulty dressing/grooming: Denies Difficulty climbing stairs: Denies Difficulty getting out of chair: Denies Difficulty using hands for taps, buttons, cutlery, and/or writing: Denies  Review of Systems  Constitutional:  Positive for fatigue.  HENT:  Negative for mouth sores and mouth dryness.   Eyes:  Positive for dryness.  Respiratory:  Positive for shortness of breath.        With exertion.   Cardiovascular:  Negative for chest pain and palpitations.  Gastrointestinal:  Negative for blood in stool, constipation and diarrhea.  Endocrine: Negative for increased urination.  Genitourinary:  Negative for  involuntary urination.  Musculoskeletal:  Positive for joint pain, joint pain, joint swelling, muscle weakness and morning stiffness. Negative for gait problem, myalgias, muscle tenderness and myalgias.  Skin:  Negative for color change, rash, hair loss and sensitivity to sunlight.  Allergic/Immunologic: Negative for susceptible to infections.  Neurological:  Negative for dizziness and headaches.  Hematological:  Negative for swollen glands.  Psychiatric/Behavioral:  Negative for depressed mood and sleep disturbance. The patient is not nervous/anxious.     PMFS History:  Patient Active Problem List   Diagnosis Date Noted   Primary osteoarthritis, right shoulder 08/24/2022   Osteoarthritis of left knee 04/28/2022   History of stroke 09/01/2020   Aortic atherosclerosis (HCC) 09/01/2020   Pain in joint of right ankle 03/27/2017   SDH (subdural hematoma) (HCC) 01/08/2017   Dysphonia 11/15/2015   Aneurysm of thoracic aorta (HCC) 06/02/2015   SOB (shortness of breath) 08/22/2014   Lupus (systemic lupus erythematosus) (HCC)    Positive QuantiFERON-TB Gold test 07/05/2012   Hypertension associated with diabetes (HCC) 05/13/2012   OSA on CPAP 05/13/2012   AAA (abdominal aortic aneurysm) without rupture (HCC) 06/27/2011    Past Medical History:  Diagnosis Date   AAA (abdominal aortic aneurysm) (HCC)    a. resolved by ct scan 09-2011   Anemia    Arthritis    Barrett's esophagus    Carpal tunnel syndrome, bilateral    Essential hypertension    Fibromyalgia    GERD (gastroesophageal  reflux disease)    History of pneumonia    Hyperlipidemia    Lupus (systemic lupus erythematosus) (HCC)    Nephrolithiasis    Pericardial effusion    a. 08/22/2014 Echo: EF 60-65%, mildly dil Ao root (40mm), mildly dil LA, mod-large partially organized pericardial effusion with possible early tamponade - asymptomatic on high dose nsaids-->conservative mgmt;  b. 08/2014 Limited Echo: EF 55-60%, Triv - small  effusion.   Sleep apnea    a. uses cpap every night    Family History  Problem Relation Age of Onset   Diabetes Mother    Diabetes Sister    Stroke Sister    Healthy Daughter    Healthy Son    Past Surgical History:  Procedure Laterality Date   APPENDECTOMY     CARPAL TUNNEL RELEASE  10/12/2011   Procedure: CARPAL TUNNEL RELEASE;  Surgeon: Wyn Forster., MD;  Location: [facility];  Service: Orthopedics;  Laterality: Left;   CARPAL TUNNEL RELEASE  12/01/2011   Procedure: CARPAL TUNNEL RELEASE;  Surgeon: Wyn Forster., MD;  Location: [facility];  Service: Orthopedics;  Laterality: Right;   CYSTOSCOPY W/ STONE MANIPULATION  2001,2009   HAND SURGERY     scar repaired lt hand   HERNIA REPAIR  1966   IR ANGIO INTRA EXTRACRAN SEL INTERNAL CAROTID BILAT MOD SED  07/09/2020   IR ANGIO VERTEBRAL SEL SUBCLAVIAN INNOMINATE UNI R MOD SED  07/09/2020   IR ANGIO VERTEBRAL SEL VERTEBRAL UNI L MOD SED  07/09/2020   IR US GUIDE VASC ACCESS RIGHT  07/09/2020   KNEE ARTHROPLASTY     KNEE ARTHROSCOPY Left    TONSILLECTOMY     Social History   Social History Narrative   Not on file   Immunization History  Administered Date(s) Administered   Fluad Quad(high Dose 65+) 01/16/2019, 10/28/2019   Influenza Nasal 11/28/2011   Influenza Split 11/27/2013, 11/28/2014   Influenza,inj,Quad PF,6+ Mos 11/11/2012, 10/29/2015   Influenza-Unspecified 11/16/2014   Moderna Sars-Covid-2 Vaccination 03/12/2019, 04/08/2019, 10/27/2019, 06/21/2020   Pneumococcal Polysaccharide-23 05/15/2012   Tdap 01/07/2017   Zoster Recombinant(Shingrix) 01/16/2018, 03/17/2018     Objective: Vital Signs: BP 128/75 (BP Location: Left Arm, Patient Position: Sitting, Cuff Size: Normal)   Pulse 80   Resp 14   Ht 5\' 9"  (1.753 m)   Wt 194 lb (88 kg)   BMI 28.65 kg/m    Physical Exam Vitals and nursing note reviewed.  Constitutional:      Appearance: He is well-developed.  HENT:      Head: Normocephalic and atraumatic.  Eyes:     Conjunctiva/sclera: Conjunctivae normal.     Pupils: Pupils are equal, round, and reactive to light.  Cardiovascular:     Rate and Rhythm: Normal rate and regular rhythm.     Heart sounds: Normal heart sounds.  Pulmonary:     Effort: Pulmonary effort is normal.     Breath sounds: Normal breath sounds.  Abdominal:     General: Bowel sounds are normal.     Palpations: Abdomen is soft.  Musculoskeletal:     Cervical back: Normal range of motion and neck supple.  Skin:    General: Skin is warm and dry.     Capillary Refill: Capillary refill takes less than 2 seconds.  Neurological:     Mental Status: He is alert and oriented to person, place, and time.  Psychiatric:        Behavior: Behavior normal.  Musculoskeletal Exam: He had limited lateral rotation, flexion and extension of the cervical spine.  Thoracic kyphosis was noted.  He had good abduction and forward flexion of his shoulders but internal rotation was limited on the right more than the left.  Elbows and wrist joints in good range of motion.  There was no synovitis over MCPs or PIPs.  He had synovial thickening over MCPs PIPs and DIPs.  Hip joints and knee joints were in good range of motion.  There was some warmth on palpation of her left knee joint.  There was no tenderness over ankles or MTPs.  CDAI Exam: CDAI Score: -- Patient Global: --; Provider Global: -- Swollen: --; Tender: -- Joint Exam 09/08/2022   No joint exam has been documented for this visit   There is currently no information documented on the homunculus. Go to the Rheumatology activity and complete the homunculus joint exam.  Investigation: No additional findings.  Imaging: No results found.  Recent Labs: Lab Results  Component Value Date   WBC 4.2 04/28/2022   HGB 12.6 (L) 04/28/2022   PLT 168 04/28/2022   NA 140 04/28/2022   K 4.1 04/28/2022   CL 102 04/28/2022   CO2 26 04/28/2022    GLUCOSE 223 (H) 04/28/2022   BUN 25 04/28/2022   CREATININE 1.24 (H) 04/28/2022   BILITOT 0.8 04/28/2022   ALKPHOS 33 (L) 07/07/2020   AST 19 04/28/2022   ALT 16 04/28/2022   PROT 6.6 04/28/2022   ALBUMIN 4.5 07/07/2020   CALCIUM 9.7 04/28/2022   GFRAA 87 03/04/2020    Speciality Comments: PLQ Eye Exam: 08/07/2022 WNL @ [facility] Follow up in 1 year.  Procedures:  No procedures performed Allergies: Atorvastatin and Lipitor [atorvastatin calcium]   Assessment / Plan:     Visit Diagnoses: Other systemic lupus erythematosus with other organ involvement (HCC) - +ANA, +RF:history of oral ulcers, nasal ulcers, malar rash, photosensitivity or sicca symptoms.  Patient states he has been doing well on hydroxychloroquine 200 mg p.o. twice daily Monday to Friday.  He denies any history of oral ulcers, nasal ulcers.  He continues to have dry eyes which she relates to using CPAP.  He complains of joint pain and stiffness which she reports in his right shoulder, left knee and left ankle.  He has not noticed any joint swelling.  He denies any history of Raynauds, photosensitivity or lymphadenopathy.  No malar rash was noted.  He had good capillary refill.  He denies any shortness of breath or palpitations.  Labs on April 28, 2022 sedimentation rate was normal, complements were normal and double-stranded DNA was negative.  High risk medication use - Plaquenil 200 mg twice daily Monday to Friday. PLQ Eye Exam: 08/07/2022.  Labs obtained on April 28, 2022 CBC showed hemoglobin of 12.6 which was low and stable.  Creatinine was elevated at 1.24 which could be due to diuretic use.  Chronic right shoulder pain-patient started experiencing right shoulder pain in late March 2024.  He states he was having difficulty lifting his arm.  He was seen by [facility] and had right cortisone injection by Dr. Madelyn Brunner on August 24, 2022.  Patient states she had good response to the injection but the symptoms did  not completely resolved.  She has been doing some stretching exercises.  I gave him a handout on shoulder exercises.  X-rays were reviewed with the patient.  Primary osteoarthritis of left knee - X-ray of left knee in 06/07/2021 revealed  severe osteoarthritis and severe chondromalacia patella. s/p orthovisc left knee 05/2022.  He gives history of prior knee joint injuries while he was in the [employer].  He had good response to viscosupplement injections.  A handout on lower extremity exercises was given.  He goes to the gym about 3 times a week.  Other proteinuria-resolved.  History of pleural effusion - no recurrence  History of pulmonary embolism  Thoracic aortic aneurysm without rupture, unspecified part (HCC)  Cerebrovascular accident (CVA) due to other mechanism (HCC)-in May 2022.  He lost to left eye peripheral vision and did not recover.  He also notices brain fog.  Malignant melanoma of right ear [facility]) - Diagnosed in December 2022  Essential hypertension-blood pressure is normal today at 128/75.  History of gastroesophageal reflux (GERD)  Positive QuantiFERON-TB Gold test - 2019.  OSA on CPAP-he is followed by Dr. Vassie Loll.  Orders: Orders Placed This Encounter  Procedures   Protein / creatinine ratio, urine   CBC with Differential/Platelet   COMPLETE METABOLIC PANEL WITH GFR   Anti-DNA antibody, double-stranded   C3 and C4   Sedimentation rate   ANA   No orders of the defined types were placed in this encounter.    Follow-Up Instructions: Return in about 5 months (around 02/08/2023) for Systemic lupus.   Pollyann Savoy, MD  Note - This record has been created using Animal nutritionist.  Chart creation errors have been sought, but may not always  have been located. Such creation errors do not reflect on  the standard of medical care.

## 2022-09-08 ENCOUNTER — Encounter: Payer: Self-pay | Admitting: Rheumatology

## 2022-09-08 ENCOUNTER — Ambulatory Visit: Payer: Medicare Other | Attending: Rheumatology | Admitting: Rheumatology

## 2022-09-08 VITALS — BP 128/75 | HR 80 | Resp 14 | Ht 69.0 in | Wt 194.0 lb

## 2022-09-08 DIAGNOSIS — Z79899 Other long term (current) drug therapy: Secondary | ICD-10-CM | POA: Insufficient documentation

## 2022-09-08 DIAGNOSIS — Z8719 Personal history of other diseases of the digestive system: Secondary | ICD-10-CM | POA: Diagnosis not present

## 2022-09-08 DIAGNOSIS — G8929 Other chronic pain: Secondary | ICD-10-CM | POA: Diagnosis not present

## 2022-09-08 DIAGNOSIS — G4733 Obstructive sleep apnea (adult) (pediatric): Secondary | ICD-10-CM

## 2022-09-08 DIAGNOSIS — Z8669 Personal history of other diseases of the nervous system and sense organs: Secondary | ICD-10-CM

## 2022-09-08 DIAGNOSIS — Z86711 Personal history of pulmonary embolism: Secondary | ICD-10-CM | POA: Insufficient documentation

## 2022-09-08 DIAGNOSIS — R7612 Nonspecific reaction to cell mediated immunity measurement of gamma interferon antigen response without active tuberculosis: Secondary | ICD-10-CM | POA: Insufficient documentation

## 2022-09-08 DIAGNOSIS — I10 Essential (primary) hypertension: Secondary | ICD-10-CM | POA: Diagnosis not present

## 2022-09-08 DIAGNOSIS — M1712 Unilateral primary osteoarthritis, left knee: Secondary | ICD-10-CM | POA: Diagnosis not present

## 2022-09-08 DIAGNOSIS — M25511 Pain in right shoulder: Secondary | ICD-10-CM | POA: Insufficient documentation

## 2022-09-08 DIAGNOSIS — C4321 Malignant melanoma of right ear and external auricular canal: Secondary | ICD-10-CM

## 2022-09-08 DIAGNOSIS — I6389 Other cerebral infarction: Secondary | ICD-10-CM

## 2022-09-08 DIAGNOSIS — R808 Other proteinuria: Secondary | ICD-10-CM

## 2022-09-08 DIAGNOSIS — M3219 Other organ or system involvement in systemic lupus erythematosus: Secondary | ICD-10-CM | POA: Diagnosis not present

## 2022-09-08 DIAGNOSIS — I712 Thoracic aortic aneurysm, without rupture, unspecified: Secondary | ICD-10-CM | POA: Diagnosis not present

## 2022-09-08 DIAGNOSIS — Z8709 Personal history of other diseases of the respiratory system: Secondary | ICD-10-CM

## 2022-09-08 NOTE — Patient Instructions (Addendum)
Vaccines You are taking a medication(s) that can suppress your immune system.  The following immunizations are recommended: Flu annually Covid-19  RSV Td/Tdap (tetanus, diphtheria, pertussis) every 10 years Pneumonia (Prevnar 15 then Pneumovax 23 at least 1 year apart.  Alternatively, can take Prevnar 20 without needing additional dose) Shingrix: 2 doses from 4 weeks to 6 months apart  Please check with your PCP to make sure you are up to date.   Shoulder Exercises Ask your health care provider which exercises are safe for you. Do exercises exactly as told by your health care provider and adjust them as directed. It is normal to feel mild stretching, pulling, tightness, or discomfort as you do these exercises. Stop right away if you feel sudden pain or your pain gets worse. Do not begin these exercises until told by your health care provider. Stretching exercises External rotation and abduction This exercise is sometimes called corner stretch. The exercise rotates your arm outward (external rotation) and moves your arm out from your body (abduction). Stand in a doorway with one of your feet slightly in front of the other. This is called a staggered stance. If you cannot reach your forearms to the door frame, stand facing a corner of a room. Choose one of the following positions as told by your health care provider: Place your hands and forearms on the door frame above your head. Place your hands and forearms on the door frame at the height of your head. Place your hands on the door frame at the height of your elbows. Slowly move your weight onto your front foot until you feel a stretch across your chest and in the front of your shoulders. Keep your head and chest upright and keep your abdominal muscles tight. Hold for __________ seconds. To release the stretch, shift your weight to your back foot. Repeat __________ times. Complete this exercise __________ times a day. Extension,  standing  Stand and hold a broomstick, a cane, or a similar object behind your back. Your hands should be a little wider than shoulder-width apart. Your palms should face away from your back. Keeping your elbows straight and your shoulder muscles relaxed, move the stick away from your body until you feel a stretch in your shoulders (extension). Avoid shrugging your shoulders while you move the stick. Keep your shoulder blades tucked down toward the middle of your back. Hold for __________ seconds. Slowly return to the starting position. Repeat __________ times. Complete this exercise __________ times a day. Range-of-motion exercises Pendulum  Stand near a wall or a surface that you can hold onto for balance. Bend at the waist and let your left / right arm hang straight down. Use your other arm to support you. Keep your back straight and do not lock your knees. Relax your left / right arm and shoulder muscles, and move your hips and your trunk so your left / right arm swings freely. Your arm should swing because of the motion of your body, not because you are using your arm or shoulder muscles. Keep moving your hips and trunk so your arm swings in the following directions, as told by your health care provider: Side to side. Forward and backward. In clockwise and counterclockwise circles. Continue each motion for __________ seconds, or for as long as told by your health care provider. Slowly return to the starting position. Repeat __________ times. Complete this exercise __________ times a day. Shoulder flexion, standing  Stand and hold a broomstick, a cane, or  a similar object. Place your hands a little more than shoulder-width apart on the object. Your left / right hand should be palm-up, and your other hand should be palm-down. Keep your elbow straight and your shoulder muscles relaxed. Push the stick up with your healthy arm to raise your left / right arm in front of your body, and then  over your head until you feel a stretch in your shoulder (flexion). Avoid shrugging your shoulder while you raise your arm. Keep your shoulder blade tucked down toward the middle of your back. Hold for __________ seconds. Slowly return to the starting position. Repeat __________ times. Complete this exercise __________ times a day. Shoulder abduction, standing  Stand and hold a broomstick, a cane, or a similar object. Place your hands a little more than shoulder-width apart on the object. Your left / right hand should be palm-up, and your other hand should be palm-down. Keep your elbow straight and your shoulder muscles relaxed. Push the object across your body toward your left / right side. Raise your left / right arm to the side of your body (abduction) until you feel a stretch in your shoulder. Do not raise your arm above shoulder height unless your health care provider tells you to do that. If directed, raise your arm over your head. Avoid shrugging your shoulder while you raise your arm. Keep your shoulder blade tucked down toward the middle of your back. Hold for __________ seconds. Slowly return to the starting position. Repeat __________ times. Complete this exercise __________ times a day. Internal rotation  Place your left / right hand behind your back, palm-up. Use your other hand to dangle an exercise band, a broomstick, or a similar object over your shoulder. Grasp the band with your left / right hand so you are holding on to both ends. Gently pull up on the band until you feel a stretch in the front of your left / right shoulder. The movement of your arm toward the center of your body is called internal rotation. Avoid shrugging your shoulder while you raise your arm. Keep your shoulder blade tucked down toward the middle of your back. Hold for __________ seconds. Release the stretch by letting go of the band and lowering your hands. Repeat __________ times. Complete this  exercise __________ times a day. Strengthening exercises External rotation  Sit in a stable chair without armrests. Secure an exercise band to a stable object at elbow height on your left / right side. Place a soft object, such as a folded towel or a small pillow, between your left / right upper arm and your body to move your elbow about 4 inches (10 cm) away from your side. Hold the end of the exercise band so it is tight and there is no slack. Keeping your elbow pressed against the soft object, slowly move your forearm out, away from your abdomen (external rotation). Keep your body steady so only your forearm moves. Hold for __________ seconds. Slowly return to the starting position. Repeat __________ times. Complete this exercise __________ times a day. Shoulder abduction  Sit in a stable chair without armrests, or stand up. Hold a __________ lb / kg weight in your left / right hand, or hold an exercise band with both hands. Start with your arms straight down and your left / right palm facing in, toward your body. Slowly lift your left / right hand out to your side (abduction). Do not lift your hand above shoulder height unless your  health care provider tells you that this is safe. Keep your arms straight. Avoid shrugging your shoulder while you do this movement. Keep your shoulder blade tucked down toward the middle of your back. Hold for __________ seconds. Slowly lower your arm, and return to the starting position. Repeat __________ times. Complete this exercise __________ times a day. Shoulder extension  Sit in a stable chair without armrests, or stand up. Secure an exercise band to a stable object in front of you so it is at shoulder height. Hold one end of the exercise band in each hand. Straighten your elbows and lift your hands up to shoulder height. Squeeze your shoulder blades together as you pull your hands down to the sides of your thighs (extension). Stop when your hands  are straight down by your sides. Do not let your hands go behind your body. Hold for __________ seconds. Slowly return to the starting position. Repeat __________ times. Complete this exercise __________ times a day. Shoulder row  Sit in a stable chair without armrests, or stand up. Secure an exercise band to a stable object in front of you so it is at chest height. Hold one end of the exercise band in each hand. Position your palms so that your thumbs are facing the ceiling (neutral position). Bend each of your elbows to a 90-degree angle (right angle) and keep your upper arms at your sides. Step back or move the chair back until the band is tight and there is no slack. Slowly pull your elbows back behind you. Hold for __________ seconds. Slowly return to the starting position. Repeat __________ times. Complete this exercise __________ times a day. Shoulder press-ups  Sit in a stable chair that has armrests. Sit upright, with your feet flat on the floor. Put your hands on the armrests so your elbows are bent and your fingers are pointing forward. Your hands should be about even with the sides of your body. Push down on the armrests and use your arms to lift yourself off the chair. Straighten your elbows and lift yourself up as much as you comfortably can. Move your shoulder blades down, and avoid letting your shoulders move up toward your ears. Keep your feet on the ground. As you get stronger, your feet should support less of your body weight as you lift yourself up. Hold for __________ seconds. Slowly lower yourself back into the chair. Repeat __________ times. Complete this exercise __________ times a day. Wall push-ups  Stand so you are facing a stable wall. Your feet should be about one arm-length away from the wall. Lean forward and place your palms on the wall at shoulder height. Keep your feet flat on the floor as you bend your elbows and lean forward toward the wall. Hold for  __________ seconds. Straighten your elbows to push yourself back to the starting position. Repeat __________ times. Complete this exercise __________ times a day. This information is not intended to replace advice given to you by your health care provider. Make sure you discuss any questions you have with your health care provider. Document Revised: 04/05/2021 Document Reviewed: 04/05/2021 [employer] Patient Education  2024 [employer] Exercises for Chronic Knee Pain Chronic knee pain is pain that lasts longer than 3 months. For most people with chronic knee pain, exercise and weight loss is an important part of treatment. Your health care provider may want you to focus on: Making the muscles that support your knee stronger. This can take pressure off your knee  and reduce pain. Preventing knee stiffness. How far you can move your knee, keeping it there or making it farther. Losing weight (if this applies) to take pressure off your knee, lower your risk for injury, and make it easier for you to exercise. Your provider will help you make an exercise program that fits your needs and physical abilities. Below are simple, low-impact exercises you can do at home. Ask your provider or physical therapist how often you should do your exercise program and how many times to repeat each exercise. General safety tips  Get your provider's approval before doing any exercises. Start slowly and stop any time you feel pain. Do not exercise if your knee pain is flaring up. Warm up first. Stretching a cold muscle can cause an injury. Do 5-10 minutes of easy movement or light stretching before beginning your exercises. Do 5-10 minutes of low-impact activity (like walking or cycling) before starting strengthening exercises. Contact your provider any time you have pain during or after exercising. Exercise can cause discomfort but should not be painful. It is normal to be a little stiff or sore after  exercising. Stretching and range-of-motion exercises Front thigh stretch  Stand up straight and support your body by holding on to a chair or resting one hand on a wall. With your legs straight and close together, bend one knee to lift your heel up toward your butt. Using one hand for support, grab your ankle with your free hand. Pull your foot up closer toward your butt to feel the stretch in front of your thigh. Hold the stretch for 30 seconds. Repeat __________ times. Complete this exercise __________ times a day. Back thigh stretch  Sit on the floor with your back straight and your legs out straight in front of you. Place the palms of your hands on the floor and slide them toward your feet as you bend at the hip. Try to touch your nose to your knees and feel the stretch in the back of your thighs. Hold for 30 seconds. Repeat __________ times. Complete this exercise __________ times a day. Calf stretch  Stand facing a wall. Place the palms of your hands flat against the wall, arms extended, and lean slightly against the wall. Get into a lunge position with one leg bent at the knee and the other leg stretched out straight behind you. Keep both feet facing the wall and increase the bend in your knee while keeping the heel of the other leg flat on the ground. You should feel the stretch in your calf. Hold for 30 seconds. Repeat __________ times. Complete this exercise __________ times a day. Strengthening exercises Straight leg lift  Lie on your back with one knee bent and the other leg out straight. Slowly lift the straight leg without bending the knee. Lift until your foot is about 12 inches (30 cm) off the floor. Hold for 3-5 seconds and slowly lower your leg. Repeat __________ times. Complete this exercise __________ times a day. Single leg dip  Stand between two chairs and put both hands on the backs of the chairs for support. Extend one leg out straight with your body weight  resting on the heel of the standing leg. Slowly bend your standing knee to dip your body to the level that is comfortable for you. Hold for 3-5 seconds. Repeat __________ times. Complete this exercise __________ times a day. Hamstring curls  Stand straight, knees close together, facing the back of a chair. Hold on to  the back of a chair with both hands. Keep one leg straight. Bend the other knee while bringing the heel up toward the butt until the knee is bent at a 90-degree angle (right angle). Hold for 3-5 seconds. Repeat __________ times. Complete this exercise __________ times a day. Wall squat  Stand straight with your back, hips, and head against a wall. Step forward one foot at a time with your back still against the wall. Your feet should be 2 feet (61 cm) from the wall at shoulder width. Keeping your back, hips, and head against the wall, slide down the wall to as close to a sitting position as you can get. Hold for 5-10 seconds, then slowly slide back up. Repeat __________ times. Complete this exercise __________ times a day. Step-ups  Stand in front of a sturdy platform or stool that is about 6 inches (15 cm) high. Slowly step up with your left / right foot, keeping your knee in line with your hip and foot. Do not let your knee bend so far that you cannot see your toes. Hold on to a chair for balance, but do not use it for support. Slowly unlock your knee and lower yourself to the starting position. Repeat __________ times. Complete this exercise __________ times a day. Contact a health care provider if: Your exercises cause pain. Your pain is worse after you exercise. Your pain prevents you from doing your exercises. This information is not intended to replace advice given to you by your health care provider. Make sure you discuss any questions you have with your health care provider. Document Revised: 02/28/2022 Document Reviewed: 02/28/2022 [employer] Patient Education   2024 [employer].

## 2022-09-11 LAB — PROTEIN / CREATININE RATIO, URINE
Creatinine, Urine: 153 mg/dL (ref 20–320)
Protein/Creat Ratio: 65 mg/g creat (ref 25–148)
Protein/Creatinine Ratio: 0.065 mg/mg creat (ref 0.025–0.148)
Total Protein, Urine: 10 mg/dL (ref 5–25)

## 2022-09-11 LAB — COMPLETE METABOLIC PANEL WITH GFR
AG Ratio: 2.2 (calc) (ref 1.0–2.5)
ALT: 12 U/L (ref 9–46)
AST: 15 U/L (ref 10–35)
Albumin: 4.3 g/dL (ref 3.6–5.1)
Alkaline phosphatase (APISO): 34 U/L — ABNORMAL LOW (ref 35–144)
BUN/Creatinine Ratio: 23 (calc) — ABNORMAL HIGH (ref 6–22)
BUN: 28 mg/dL — ABNORMAL HIGH (ref 7–25)
CO2: 26 mmol/L (ref 20–32)
Calcium: 9.8 mg/dL (ref 8.6–10.3)
Chloride: 106 mmol/L (ref 98–110)
Creat: 1.2 mg/dL (ref 0.70–1.22)
Globulin: 2 g/dL (calc) (ref 1.9–3.7)
Glucose, Bld: 191 mg/dL — ABNORMAL HIGH (ref 65–99)
Potassium: 4.6 mmol/L (ref 3.5–5.3)
Sodium: 141 mmol/L (ref 135–146)
Total Bilirubin: 0.6 mg/dL (ref 0.2–1.2)
Total Protein: 6.3 g/dL (ref 6.1–8.1)
eGFR: 59 mL/min/{1.73_m2} — ABNORMAL LOW (ref >=60)

## 2022-09-11 LAB — C3 AND C4
C3 Complement: 126 mg/dL
C4 Complement: 18 mg/dL

## 2022-09-11 LAB — ANTI-NUCLEAR AB-TITER (ANA TITER): ANA Titer 1: 1:320 {titer} — ABNORMAL HIGH

## 2022-09-11 LAB — SEDIMENTATION RATE: Sed Rate: 6 mm/h (ref 0–20)

## 2022-09-11 LAB — CBC WITH DIFFERENTIAL/PLATELET
Absolute Monocytes: 610 cells/uL (ref 200–950)
Basophils Absolute: 51 cells/uL (ref 0–200)
Basophils Relative: 0.9 %
Eosinophils Absolute: 120 cells/uL (ref 15–500)
Eosinophils Relative: 2.1 %
HCT: 34.8 % — ABNORMAL LOW (ref 38.5–50.0)
Hemoglobin: 11.7 g/dL — ABNORMAL LOW (ref 13.2–17.1)
Lymphs Abs: 1254 cells/uL (ref 850–3900)
MCH: 30.5 pg (ref 27.0–33.0)
MCHC: 33.6 g/dL (ref 32.0–36.0)
MCV: 90.6 fL (ref 80.0–100.0)
MPV: 10.7 fL (ref 7.5–12.5)
Monocytes Relative: 10.7 %
Neutro Abs: 3665 cells/uL (ref 1500–7800)
Neutrophils Relative %: 64.3 %
Platelets: 216 10*3/uL (ref 140–400)
RBC: 3.84 10*6/uL — ABNORMAL LOW (ref 4.20–5.80)
RDW: 11.9 % (ref 11.0–15.0)
Total Lymphocyte: 22 %
WBC: 5.7 10*3/uL (ref 3.8–10.8)

## 2022-09-11 LAB — ANTI-DNA ANTIBODY, DOUBLE-STRANDED: ds DNA Ab: 2 IU/mL

## 2022-09-11 LAB — ANA: Anti Nuclear Antibody (ANA): POSITIVE — AB

## 2022-09-13 NOTE — Progress Notes (Signed)
Hemoglobin is low.  Please advise patient to take multivitamin with iron.  Glucose is elevated at 191.  GFR is low and stable.  ANA titer is stable, double-stranded DNA negative, complements normal, sed rate normal, urine protein creatinine ratio normal.  Labs do not indicate lupus flare.  No change in treatment advised.

## 2022-09-14 ENCOUNTER — Ambulatory Visit: Payer: Medicare Other | Admitting: Sports Medicine

## 2022-09-14 ENCOUNTER — Encounter: Payer: Self-pay | Admitting: Sports Medicine

## 2022-09-14 DIAGNOSIS — M329 Systemic lupus erythematosus, unspecified: Secondary | ICD-10-CM | POA: Diagnosis not present

## 2022-09-14 DIAGNOSIS — M25511 Pain in right shoulder: Secondary | ICD-10-CM | POA: Diagnosis not present

## 2022-09-14 DIAGNOSIS — M19011 Primary osteoarthritis, right shoulder: Secondary | ICD-10-CM

## 2022-09-14 DIAGNOSIS — G8929 Other chronic pain: Secondary | ICD-10-CM | POA: Diagnosis not present

## 2022-09-14 DIAGNOSIS — M1712 Unilateral primary osteoarthritis, left knee: Secondary | ICD-10-CM

## 2022-09-14 MED ORDER — METHYLPREDNISOLONE ACETATE 40 MG/ML IJ SUSP
40.0000 mg | INTRAMUSCULAR | Status: AC | PRN
Start: 2022-09-14 — End: 2022-09-14
  Administered 2022-09-14: 40 mg via INTRA_ARTICULAR

## 2022-09-14 MED ORDER — LIDOCAINE HCL 1 % IJ SOLN
2.0000 mL | INTRAMUSCULAR | Status: AC | PRN
Start: 2022-09-14 — End: 2022-09-14
  Administered 2022-09-14: 2 mL

## 2022-09-14 MED ORDER — BUPIVACAINE HCL 0.25 % IJ SOLN
2.0000 mL | INTRAMUSCULAR | Status: AC | PRN
Start: 2022-09-14 — End: 2022-09-14
  Administered 2022-09-14: 2 mL via INTRA_ARTICULAR

## 2022-09-14 NOTE — Progress Notes (Signed)
Tyler Norris - 88 y.o. male MRN [id number]  Date of birth: 1935-02-10  Office Visit Note: Visit Date: 09/14/2022 PCP: Henrine Screws, MD Referred by: Henrine Screws, MD  Subjective: Chief Complaint  Patient presents with   Right Shoulder - Pain   HPI: Tyler Norris is a pleasant 88 y.o. male who presents today for acute on chronic right shoulder pain.  Tyler Norris is here for follow-up of his acute on chronic right shoulder pain.  Last month was sent to me for an injection only for his glenohumeral joint.  This did give him some relief in the shoulder pain but he still has some pain and limitation in range of motion.  He is not taking NSAIDs given his recommendation from rheumatology, is managed on Plaquenil.  He is currently not doing any formalized physical therapy, did have a history of some stretching he had been doing in the past.  Also had left knee osteoarthritis, in the past he has received corticosteroid and gel injections with some relief of his pain.  Notable past medical history of right shoulder and left knee osteoarthritis.  He does have systemic lupus with associated arthritis, follows with Dr. Ivar Drape.  Independent note reviewed from 09/08/2022 he is managed on Plaquenil 200 mg twice a day from Monday through Friday.  Pertinent ROS were reviewed with the patient and found to be negative unless otherwise specified above in HPI.   Assessment & Plan: Visit Diagnoses:  1. Primary osteoarthritis, right shoulder   2. Chronic right shoulder pain   3. Arthritis of right acromioclavicular joint   4. Unilateral primary osteoarthritis, left knee   5. Systemic lupus erythematosus, unspecified SLE type, unspecified organ involvement status (HCC)    Plan: Discussed with Tyler Norris the nature of his acute on chronic right shoulder pain, discussed this is multifactorial as he has severe osteoarthritis of the glenohumeral joint as well as the [facility] joint.  He likely has a degree of  rotator cuff insufficiency as well given his high riding humeral head.  Through shared decision-making, did proceed with subacromial joint injection to help aid the rotator cuff tendons.  Discussed formal therapy versus home rehab, he is interested in maintaining range of motion with some home therapy. Did print out a customized shoulder rehab protocol for ROM for him. My athletic trainer Lequita Halt and student ATC, Kylie, did review these exercises with him in the room, he is to perform these once daily after about 48 hours of rest modified activity after the injection.  He will continue his 200 mg Plaquenil twice daily for his systemic lupus and related joint pain.  Could always consider a short-term low-dose of Celebrex in the future, but will hold given his Plaquenil and his comorbidities.  Would like to see what progress he makes from the above treatments he can follow-up in 6 weeks.  Did discuss we could consider treatment or repeating steroid or viscosupplementation for the left knee if needed.  Follow-up: Return in about 6 weeks (around 10/26/2022) for right shoulder (30-min f/u).   Meds & Orders: No orders of the defined types were placed in this encounter.   Orders Placed This Encounter  Procedures   Large Joint Inj: R subacromial bursa     Procedures: Large Joint Inj: R subacromial bursa on 09/14/2022 2:35 PM Indications: pain Details: 22 G 1.5 in needle, posterior approach Medications: 2 mL lidocaine 1 %; 2 mL bupivacaine 0.25 %; 40 mg methylPREDNISolone acetate 40 MG/ML Outcome: tolerated well,  no immediate complications  Subacromial Joint Injection, Right Shoulder After discussion on risks/benefits/indications, informed verbal consent was obtained. A timeout was then performed. Patient was seated on table in exam room. The patient's shoulder was prepped with betadine and alcohol swabs and utilizing posterior approach a 22G, 1.5" needle was directed anteriorly and laterally into the  patient's subacromial space was injected with 2:2:1 mixture of lidocaine:bupivicaine:depomedrol with appreciation of free-flowing of the injectate into the bursal space. Patient tolerated the procedure well without immediate complications.   Procedure, treatment alternatives, risks and benefits explained, specific risks discussed. Consent was given by the patient. Immediately prior to procedure a time out was called to verify the correct patient, procedure, equipment, support staff and site/side marked as required. Patient was prepped and draped in the usual sterile fashion.          Clinical History: No specialty comments available.  He reports that he has never smoked. He has never been exposed to tobacco smoke. He has never used smokeless tobacco. No results for input(s): "HGBA1C", "LABURIC" in the last 8760 hours.  Objective:   Vital Signs: There were no vitals taken for this visit.  Physical Exam  Gen: Well-appearing, in no acute distress; non-toxic CV:  Well-perfused. Warm.  Resp: Breathing unlabored on room air; no wheezing. Psych: Fluid speech in conversation; appropriate affect; normal thought process Neuro: Sensation intact throughout. No gross coordination deficits.   Ortho Exam - Right shoulder: Evaluation of the right shoulder demonstrates no redness swelling or effusion.  There is bony bossing over the [facility] joint from likely arthritic spurring.  There is restriction in active and passive range of motion of the shoulder with forward flexion to approximately 160 degrees, abduction 145 degrees.  There is notable grinding taking the shoulder through active and passive range of motion.  No significant TTP over the [facility] joint, negative scarf's test.  There is a positive painful arc test, drop arm test. NVI.  Imaging:  *Right shoulder x-ray from 08/24/2022 --> 3 views including AP, axial and scapular Y views were independently reviewed and interpreted by myself today.  X-rays demonstrate  severe bone-on-bone glenohumeral joint arthritis with subchondral cystic change of the humeral head and acetabulum.  There is severe AC joint arthritic change with a large bony spur of the distal end of the clavicular region.  There is some mild superior migration of the humeral head, likely indicative of rotator cuff arthropathy.  No acute fracture noted.  Past Medical/Family/Surgical/Social History: Medications & Allergies reviewed per EMR, new medications updated. Patient Active Problem List   Diagnosis Date Noted   Primary osteoarthritis, right shoulder 08/24/2022   Osteoarthritis of left knee 04/28/2022   History of stroke 09/01/2020   Aortic atherosclerosis (HCC) 09/01/2020   Pain in joint of right ankle 03/27/2017   SDH (subdural hematoma) (HCC) 01/08/2017   Dysphonia 11/15/2015   Aneurysm of thoracic aorta (HCC) 06/02/2015   SOB (shortness of breath) 08/22/2014   Lupus (systemic lupus erythematosus) (HCC)    Positive QuantiFERON-TB Gold test 07/05/2012   Hypertension associated with diabetes (HCC) 05/13/2012   OSA on CPAP 05/13/2012   AAA (abdominal aortic aneurysm) without rupture (HCC) 06/27/2011   Past Medical History:  Diagnosis Date   AAA (abdominal aortic aneurysm) (HCC)    a. resolved by ct scan 09-2011   Anemia    Arthritis    Barrett's esophagus    Carpal tunnel syndrome, bilateral    Essential hypertension    Fibromyalgia    GERD (  gastroesophageal reflux disease)    History of pneumonia    Hyperlipidemia    Lupus (systemic lupus erythematosus) (HCC)    Nephrolithiasis    Pericardial effusion    a. 08/22/2014 Echo: EF 60-65%, mildly dil Ao root (40mm), mildly dil LA, mod-large partially organized pericardial effusion with possible early tamponade - asymptomatic on high dose nsaids-->conservative mgmt;  b. 08/2014 Limited Echo: EF 55-60%, Triv - small effusion.   Sleep apnea    a. uses cpap every night   Family History  Problem Relation Age of Onset   Diabetes  Mother    Diabetes Sister    Stroke Sister    Healthy Daughter    Healthy Son    Past Surgical History:  Procedure Laterality Date   APPENDECTOMY     CARPAL TUNNEL RELEASE  10/12/2011   Procedure: CARPAL TUNNEL RELEASE;  Surgeon: Wyn Forster., MD;  Location: [facility];  Service: Orthopedics;  Laterality: Left;   CARPAL TUNNEL RELEASE  12/01/2011   Procedure: CARPAL TUNNEL RELEASE;  Surgeon: Wyn Forster., MD;  Location: [facility];  Service: Orthopedics;  Laterality: Right;   CYSTOSCOPY W/ STONE MANIPULATION  2001,2009   HAND SURGERY     scar repaired lt hand   HERNIA REPAIR  1966   IR ANGIO INTRA EXTRACRAN SEL INTERNAL CAROTID BILAT MOD SED  07/09/2020   IR ANGIO VERTEBRAL SEL SUBCLAVIAN INNOMINATE UNI R MOD SED  07/09/2020   IR ANGIO VERTEBRAL SEL VERTEBRAL UNI L MOD SED  07/09/2020   IR US GUIDE VASC ACCESS RIGHT  07/09/2020   KNEE ARTHROPLASTY     KNEE ARTHROSCOPY Left    TONSILLECTOMY     Social History   Occupational History   Occupation: retired    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

## 2022-09-14 NOTE — Progress Notes (Signed)
Patient was instructed in 10 minutes of therapeutic exercises for right shoulder to improve strength, ROM and function according to my instructions and plan of care by a Certified Athletic Trainer during the office visit. A customized handout was provided and demonstration of proper technique shown and discussed. Patient did perform exercises and demonstrate understanding through teachback.  All questions discussed and answered.  

## 2022-09-28 ENCOUNTER — Ambulatory Visit: Payer: Medicare Other | Admitting: Rheumatology

## 2022-10-13 DIAGNOSIS — Z8673 Personal history of transient ischemic attack (TIA), and cerebral infarction without residual deficits: Secondary | ICD-10-CM | POA: Diagnosis not present

## 2022-10-13 DIAGNOSIS — C4321 Malignant melanoma of right ear and external auricular canal: Secondary | ICD-10-CM | POA: Diagnosis not present

## 2022-10-13 DIAGNOSIS — K7689 Other specified diseases of liver: Secondary | ICD-10-CM | POA: Diagnosis not present

## 2022-10-13 DIAGNOSIS — R911 Solitary pulmonary nodule: Secondary | ICD-10-CM | POA: Diagnosis not present

## 2022-10-17 ENCOUNTER — Ambulatory Visit (HOSPITAL_BASED_OUTPATIENT_CLINIC_OR_DEPARTMENT_OTHER): Payer: Medicare Other | Admitting: Pulmonary Disease

## 2022-10-17 ENCOUNTER — Encounter (HOSPITAL_BASED_OUTPATIENT_CLINIC_OR_DEPARTMENT_OTHER): Payer: Self-pay | Admitting: Pulmonary Disease

## 2022-10-17 VITALS — BP 102/64 | HR 54 | Ht 69.0 in | Wt 196.0 lb

## 2022-10-17 DIAGNOSIS — G4733 Obstructive sleep apnea (adult) (pediatric): Secondary | ICD-10-CM | POA: Diagnosis not present

## 2022-10-17 DIAGNOSIS — M329 Systemic lupus erythematosus, unspecified: Secondary | ICD-10-CM

## 2022-10-17 NOTE — Patient Instructions (Signed)
X change to autoCPAP 8-15 cm

## 2022-10-17 NOTE — Progress Notes (Signed)
   Subjective:    Patient ID: Tyler Norris, male    DOB: 27-Aug-1934, 88 y.o.   MRN: [id number]  HPI  88 yo never smoker with SLE diagnosed 2014 for FU of OSA   Maintained on auto CPAP    PMH -  Adm 07/2014 -Pericardial effusion with near tamponade, picked up on CT angio , resolved on FU 10/2014  - on NSAIDs   05/2015 He developed left pleural effusion and underwent thoracentesis -about 60 mL >> monocytic exudate as before>> plaquanil added   Treated with INH for latent TB 12/2012 . Followed by Corliss Skains for SLE, on Plaquenil . Off prednisone. Pleural effusions resolved. H/o pulmonary embolism 05/2012 - finished coumadin course  02/2013  CKD 2 - COladonato Melanoma -ear R PCA territory stroke 06/2020 (had unilateral vision loss)   Annual follow-up visit. His breathing is doing okay.  He stays active.  He continues on Plaquenil and denies any side effects. He has settled down with nasal pillows and denies any problems with mask or pressure.  Last visit we requested increase in pressure but this has not been done yet and he remains on auto settings 5 to 15 cm   Significant tests/ events reviewed   Thoracentesis 04/2012 of 230 mL of monocytic exudative fluid.    CT angiogram on 06/05/12 showed small subsegmental pulmonary emboli in the right upper lobe, right middle lobe and left lower lobe. A right effusion was noted somewhat loculated and a small left effusion and was layering.     PFTs  - 04/2014 - unchanged, FEV1 79%, FVC 78% improves to >80% with BDs, TLC 68%, DLCO 55%  CT angiogram chest 09/2018 -left effusion resolved Less than 4 mm pulmonary nodules   Review of Systems neg for any significant sore throat, dysphagia, itching, sneezing, nasal congestion or excess/ purulent secretions, fever, chills, sweats, unintended wt loss, pleuritic or exertional cp, hempoptysis, orthopnea pnd or change in chronic leg swelling. Also denies presyncope, palpitations, heartburn, abdominal pain,  nausea, vomiting, diarrhea or change in bowel or urinary habits, dysuria,hematuria, rash, arthralgias, visual complaints, headache, numbness weakness or ataxia.     Objective:   Physical Exam  Gen. Pleasant, obese, in no distress ENT - no lesions, no post nasal drip Neck: No JVD, no thyromegaly, no carotid bruits Lungs: no use of accessory muscles, no dullness to percussion, decreased without rales or rhonchi  Cardiovascular: Rhythm regular, heart sounds  normal, no murmurs or gallops, no peripheral edema Musculoskeletal: No deformities, no cyanosis or clubbing , no tremors       Assessment & Plan:

## 2022-10-17 NOTE — Assessment & Plan Note (Signed)
No evidence of recurrent effusions, he remains on Plaquenil

## 2022-10-17 NOTE — Assessment & Plan Note (Signed)
CPAP download was reviewed which shows excellent control of events on auto settings 5 to 15 cm with average pressure of 13 and maximum pressure of 14 cm.  He is very compliant up to 7 hours per night without a single missed night and has a mild leak.  CPAP certainly helped improve his daytime somnolence and fatigue. We will change him to auto settings 8 to 15 cm Weight loss encouraged, compliance with goal of at least 4-6 hrs every night is the expectation. Advised against medications with sedative side effects Cautioned against driving when sleepy - understanding that sleepiness will vary on a day to day basis

## 2022-10-23 DIAGNOSIS — N2581 Secondary hyperparathyroidism of renal origin: Secondary | ICD-10-CM | POA: Diagnosis not present

## 2022-10-23 DIAGNOSIS — R809 Proteinuria, unspecified: Secondary | ICD-10-CM | POA: Diagnosis not present

## 2022-10-23 DIAGNOSIS — N189 Chronic kidney disease, unspecified: Secondary | ICD-10-CM | POA: Diagnosis not present

## 2022-10-23 DIAGNOSIS — I129 Hypertensive chronic kidney disease with stage 1 through stage 4 chronic kidney disease, or unspecified chronic kidney disease: Secondary | ICD-10-CM | POA: Diagnosis not present

## 2022-10-23 DIAGNOSIS — D631 Anemia in chronic kidney disease: Secondary | ICD-10-CM | POA: Diagnosis not present

## 2022-10-23 DIAGNOSIS — E1122 Type 2 diabetes mellitus with diabetic chronic kidney disease: Secondary | ICD-10-CM | POA: Diagnosis not present

## 2022-10-23 DIAGNOSIS — M329 Systemic lupus erythematosus, unspecified: Secondary | ICD-10-CM | POA: Diagnosis not present

## 2022-10-23 DIAGNOSIS — N182 Chronic kidney disease, stage 2 (mild): Secondary | ICD-10-CM | POA: Diagnosis not present

## 2022-10-27 DIAGNOSIS — C4321 Malignant melanoma of right ear and external auricular canal: Secondary | ICD-10-CM | POA: Diagnosis not present

## 2022-10-27 DIAGNOSIS — R9389 Abnormal findings on diagnostic imaging of other specified body structures: Secondary | ICD-10-CM | POA: Diagnosis not present

## 2022-11-10 DIAGNOSIS — C4321 Malignant melanoma of right ear and external auricular canal: Secondary | ICD-10-CM | POA: Diagnosis not present

## 2022-12-03 ENCOUNTER — Other Ambulatory Visit: Payer: Self-pay | Admitting: Physician Assistant

## 2022-12-04 NOTE — Telephone Encounter (Signed)
Last Fill: 08/23/2022  Eye exam: 08/07/2022 WNL    Labs: 09/08/2022 Hemoglobin is low. Glucose is elevated at 191.  GFR is low and stable.   Next Visit: 02/14/2023  Last Visit: 09/08/2022  DX:Other systemic lupus erythematosus with other organ involvement   Current Dose per office note 09/08/2022: Plaquenil 200 mg twice daily Monday to Friday.   Okay to refill Plaquenil?

## 2023-01-04 DIAGNOSIS — D485 Neoplasm of uncertain behavior of skin: Secondary | ICD-10-CM | POA: Diagnosis not present

## 2023-01-04 DIAGNOSIS — D1801 Hemangioma of skin and subcutaneous tissue: Secondary | ICD-10-CM | POA: Diagnosis not present

## 2023-01-04 DIAGNOSIS — L821 Other seborrheic keratosis: Secondary | ICD-10-CM | POA: Diagnosis not present

## 2023-01-04 DIAGNOSIS — Z08 Encounter for follow-up examination after completed treatment for malignant neoplasm: Secondary | ICD-10-CM | POA: Diagnosis not present

## 2023-01-04 DIAGNOSIS — L814 Other melanin hyperpigmentation: Secondary | ICD-10-CM | POA: Diagnosis not present

## 2023-01-04 DIAGNOSIS — D0421 Carcinoma in situ of skin of right ear and external auricular canal: Secondary | ICD-10-CM | POA: Diagnosis not present

## 2023-01-04 DIAGNOSIS — Z85820 Personal history of malignant melanoma of skin: Secondary | ICD-10-CM | POA: Diagnosis not present

## 2023-01-04 DIAGNOSIS — Z86006 Personal history of melanoma in-situ: Secondary | ICD-10-CM | POA: Diagnosis not present

## 2023-01-30 DIAGNOSIS — D0421 Carcinoma in situ of skin of right ear and external auricular canal: Secondary | ICD-10-CM | POA: Diagnosis not present

## 2023-01-31 NOTE — Progress Notes (Signed)
Office Visit Note  Patient: Tyler Norris             Date of Birth: 20-Oct-1934           MRN: [id number]             PCP: Henrine Screws, MD Referring: Henrine Screws, MD Visit Date: 02/14/2023 Occupation: @GUAROCC @  Subjective:  Medication management  History of Present Illness: Tyler Norris is a 88 y.o. male with systemic lupus and osteoarthritis.  He has been taking hydroxychloroquine 200 mg p.o. twice daily Monday to Friday on a regular basis.  He denies any interruption in the treatment.  He states he has occasional discomfort in his right knee and right ankle.  He has not noticed any joint swelling.  He has noted improvement in his right shoulder joint pain after the cortisone injection.  He states he tries to do exercises on a regular basis.  He is concerned about the memory loss which she relates to previous head injury.  He continues to have dry mouth and dry eye symptoms.  He denies any history of oral ulcers, nasal ulcers, malar rash.  He notices mild Raynaud's symptoms.    Activities of Daily Living:  Patient reports morning stiffness for 0 minutes.   Patient Denies nocturnal pain.  Difficulty dressing/grooming: Denies Difficulty climbing stairs: Reports Difficulty getting out of chair: Denies Difficulty using hands for taps, buttons, cutlery, and/or writing: Denies  Review of Systems  Constitutional:  Positive for fatigue.  HENT:  Positive for mouth dryness. Negative for mouth sores.   Eyes:  Positive for dryness.  Respiratory:  Negative for shortness of breath.   Cardiovascular:  Negative for chest pain and palpitations.  Gastrointestinal:  Negative for blood in stool, constipation and diarrhea.  Endocrine: Negative for increased urination.  Genitourinary:  Negative for involuntary urination.  Musculoskeletal:  Positive for joint pain, joint pain, joint swelling and muscle weakness. Negative for gait problem, myalgias, morning stiffness, muscle  tenderness and myalgias.  Skin:  Positive for color change and sensitivity to sunlight. Negative for rash and hair loss.  Allergic/Immunologic: Negative for susceptible to infections.  Neurological:  Negative for dizziness and headaches.  Hematological:  Negative for swollen glands.  Psychiatric/Behavioral:  Negative for depressed mood and sleep disturbance. The patient is not nervous/anxious.     PMFS History:  Patient Active Problem List   Diagnosis Date Noted   Primary osteoarthritis, right shoulder 08/24/2022   Osteoarthritis of left knee 04/28/2022   History of stroke 09/01/2020   Aortic atherosclerosis (HCC) 09/01/2020   Pain in joint of right ankle 03/27/2017   SDH (subdural hematoma) (HCC) 01/08/2017   Dysphonia 11/15/2015   Aneurysm of thoracic aorta (HCC) 06/02/2015   Lupus (systemic lupus erythematosus) (HCC)    Positive QuantiFERON-TB Gold test 07/05/2012   Hypertension associated with diabetes (HCC) 05/13/2012   OSA on CPAP 05/13/2012   AAA (abdominal aortic aneurysm) without rupture (HCC) 06/27/2011    Past Medical History:  Diagnosis Date   AAA (abdominal aortic aneurysm) (HCC)    a. resolved by ct scan 09-2011   Anemia    Arthritis    Barrett's esophagus    Carpal tunnel syndrome, bilateral    Essential hypertension    Fibromyalgia    GERD (gastroesophageal reflux disease)    History of pneumonia    Hyperlipidemia    Lupus (systemic lupus erythematosus) (HCC)    Nephrolithiasis    Pericardial effusion    a. 08/22/2014  Echo: EF 60-65%, mildly dil Ao root (40mm), mildly dil LA, mod-large partially organized pericardial effusion with possible early tamponade - asymptomatic on high dose nsaids-->conservative mgmt;  b. 08/2014 Limited Echo: EF 55-60%, Triv - small effusion.   Sleep apnea    a. uses cpap every night    Family History  Problem Relation Age of Onset   Diabetes Mother    Diabetes Sister    Stroke Sister    Healthy Daughter    Healthy Son     Past Surgical History:  Procedure Laterality Date   APPENDECTOMY     CARPAL TUNNEL RELEASE  10/12/2011   Procedure: CARPAL TUNNEL RELEASE;  Surgeon: Wyn Forster., MD;  Location: [facility];  Service: Orthopedics;  Laterality: Left;   CARPAL TUNNEL RELEASE  12/01/2011   Procedure: CARPAL TUNNEL RELEASE;  Surgeon: Wyn Forster., MD;  Location: [facility];  Service: Orthopedics;  Laterality: Right;   CYSTOSCOPY W/ STONE MANIPULATION  2001,2009   HAND SURGERY     scar repaired lt hand   HERNIA REPAIR  1966   IR ANGIO INTRA EXTRACRAN SEL INTERNAL CAROTID BILAT MOD SED  07/09/2020   IR ANGIO VERTEBRAL SEL SUBCLAVIAN INNOMINATE UNI R MOD SED  07/09/2020   IR ANGIO VERTEBRAL SEL VERTEBRAL UNI L MOD SED  07/09/2020   IR US GUIDE VASC ACCESS RIGHT  07/09/2020   KNEE ARTHROPLASTY     KNEE ARTHROSCOPY Left    MELANOMA EXCISION Right 2024   right ear   TONSILLECTOMY     Social History   Social History Narrative   Not on file   Immunization History  Administered Date(s) Administered   Fluad Quad(high Dose 65+) 01/16/2019, 10/28/2019   Influenza Nasal 11/28/2011   Influenza Split 11/27/2013, 11/28/2014   Influenza,inj,Quad PF,6+ Mos 11/11/2012, 10/29/2015   Influenza-Unspecified 11/16/2014   Moderna Sars-Covid-2 Vaccination 03/12/2019, 04/08/2019, 10/27/2019, 06/21/2020   Pneumococcal Polysaccharide-23 05/15/2012   Tdap 01/07/2017   Zoster Recombinant(Shingrix) 01/16/2018, 03/17/2018     Objective: Vital Signs: BP 135/78 (BP Location: Left Arm, Patient Position: Sitting, Cuff Size: Normal)   Pulse (!) 51   Resp 16   Ht 5\' 9"  (1.753 m)   Wt 202 lb (91.6 kg)   BMI 29.83 kg/m    Physical Exam Vitals and nursing note reviewed.  Constitutional:      Appearance: He is well-developed.  HENT:     Head: Normocephalic and atraumatic.  Eyes:     Conjunctiva/sclera: Conjunctivae normal.     Pupils: Pupils are equal, round, and reactive to  light.  Cardiovascular:     Rate and Rhythm: Normal rate and regular rhythm.     Heart sounds: Normal heart sounds.  Pulmonary:     Effort: Pulmonary effort is normal.     Breath sounds: Normal breath sounds.  Abdominal:     General: Bowel sounds are normal.     Palpations: Abdomen is soft.  Musculoskeletal:     Cervical back: Normal range of motion and neck supple.  Skin:    General: Skin is warm and dry.     Capillary Refill: Capillary refill takes less than 2 seconds.  Neurological:     Mental Status: He is alert and oriented to person, place, and time.  Psychiatric:        Behavior: Behavior normal.      Musculoskeletal Exam: He had limited range of motion of the cervical spine.  He had limited flexion and  extension of the cervical spine.  Thoracic kyphosis was noted.  He had bilateral shoulder joint abduction limited 160 degrees.  His bilateral shoulders with some discomfort.  Elbows, wrist joints, MCPs PIPs and DIPs with good range of motion.  Bilateral PIP and DIP thickening was noted.  Hip joints and knee joints with good range of motion.  There was no tenderness over ankles or MTPs.  CDAI Exam: CDAI Score: -- Patient Global: --; Provider Global: -- Swollen: --; Tender: -- Joint Exam 02/14/2023   No joint exam has been documented for this visit   There is currently no information documented on the homunculus. Go to the Rheumatology activity and complete the homunculus joint exam.  Investigation: No additional findings.  Imaging: No results found.  Recent Labs: Lab Results  Component Value Date   WBC 5.7 09/08/2022   HGB 11.7 (L) 09/08/2022   PLT 216 09/08/2022   NA 141 09/08/2022   K 4.6 09/08/2022   CL 106 09/08/2022   CO2 26 09/08/2022   GLUCOSE 191 (H) 09/08/2022   BUN 28 (H) 09/08/2022   CREATININE 1.20 09/08/2022   BILITOT 0.6 09/08/2022   ALKPHOS 33 (L) 07/07/2020   AST 15 09/08/2022   ALT 12 09/08/2022   PROT 6.3 09/08/2022   ALBUMIN 4.5  07/07/2020   CALCIUM 9.8 09/08/2022   GFRAA 87 03/04/2020    Speciality Comments: PLQ Eye Exam: 08/07/2022 WNL @ [facility] Follow up in 1 year.  Procedures:  No procedures performed Allergies: Atorvastatin and Lipitor [atorvastatin calcium]   Assessment / Plan:     Visit Diagnoses: Other systemic lupus erythematosus with other organ involvement (HCC) - +ANA, +RF:history of oral ulcers, nasal ulcers, malar rash, photosensitivity or sicca symptoms.  Patient had no synovitis on the examination today.  He had no recent history of oral ulcers, nasal ulcers, malar rash, or photosensitivity.  He continues to have some sicca symptoms.  He also has mild Raynaud's symptoms.  Keeping core temperature warm and warm clothing was advised.  Over-the-counter products were discussed.  Labs obtained on July 2024 were reviewed.  C3-C4, sed rate were normal.  Double-stranded ENA was negative.  Use of sunscreen was advised.  High risk medication use - Plaquenil 200 mg twice daily Monday to Friday. PLQ Eye Exam: 08/07/2022.  CBC and CMP was stable in July 2024.  Chronic right shoulder pain - He was seen by [facility] and had right cortisone injection by Dr. Madelyn Brunner on August 24, 2022.  He noticed some improvement after the shoulder joint injection.  He still have some limitation with range of motion of his shoulders.  Primary osteoarthritis of left knee -he has not mitten discomfort in his left knee joint.  He had good range of motion of his left knee joint today.  X-ray of left knee in 06/07/2021 revealed severe osteoarthritis and severe chondromalacia patella. s/p orthovisc left knee 05/2022.  Other proteinuria  Other medical problems are listed as follows:  History of pleural effusion - no recurrence  History of pulmonary embolism  Thoracic aortic aneurysm without rupture, unspecified part (HCC)  Cerebrovascular accident (CVA) due to other mechanism [facility]) - in May 2022.  He lost to left eye  peripheral vision and did not recover.  He also notices brain fog.  Patient reports ongoing brain fog and memory loss.  I offered referral to neurology but he declined.  Malignant melanoma of right ear [facility]) - Diagnosed in December 2022  History of gastroesophageal reflux (  GERD)  Essential hypertension  Positive QuantiFERON-TB Gold test - 2019.  OSA on CPAP - he is followed by Dr. Vassie Loll.  Orders: Orders Placed This Encounter  Procedures   Protein / creatinine ratio, urine   CBC with Differential/Platelet   COMPLETE METABOLIC PANEL WITH GFR   Anti-DNA antibody, double-stranded   C3 and C4   Sedimentation rate   No orders of the defined types were placed in this encounter.    Follow-Up Instructions: Return in about 5 months (around 07/15/2023) for Systemic lupus, Osteoarthritis.   Pollyann Savoy, MD  Note - This record has been created using Animal nutritionist.  Chart creation errors have been sought, but may not always  have been located. Such creation errors do not reflect on  the standard of medical care.

## 2023-02-02 DIAGNOSIS — R1905 Periumbilic swelling, mass or lump: Secondary | ICD-10-CM | POA: Diagnosis not present

## 2023-02-02 DIAGNOSIS — M2041 Other hammer toe(s) (acquired), right foot: Secondary | ICD-10-CM | POA: Diagnosis not present

## 2023-02-14 ENCOUNTER — Encounter: Payer: Self-pay | Admitting: Rheumatology

## 2023-02-14 ENCOUNTER — Ambulatory Visit: Payer: Medicare Other | Attending: Rheumatology | Admitting: Rheumatology

## 2023-02-14 VITALS — BP 135/78 | HR 51 | Resp 16 | Ht 69.0 in | Wt 202.0 lb

## 2023-02-14 DIAGNOSIS — Z86711 Personal history of pulmonary embolism: Secondary | ICD-10-CM | POA: Insufficient documentation

## 2023-02-14 DIAGNOSIS — G8929 Other chronic pain: Secondary | ICD-10-CM | POA: Insufficient documentation

## 2023-02-14 DIAGNOSIS — G4733 Obstructive sleep apnea (adult) (pediatric): Secondary | ICD-10-CM | POA: Insufficient documentation

## 2023-02-14 DIAGNOSIS — Z79899 Other long term (current) drug therapy: Secondary | ICD-10-CM | POA: Diagnosis not present

## 2023-02-14 DIAGNOSIS — R29890 Loss of height: Secondary | ICD-10-CM | POA: Diagnosis not present

## 2023-02-14 DIAGNOSIS — R808 Other proteinuria: Secondary | ICD-10-CM | POA: Insufficient documentation

## 2023-02-14 DIAGNOSIS — M3219 Other organ or system involvement in systemic lupus erythematosus: Secondary | ICD-10-CM | POA: Insufficient documentation

## 2023-02-14 DIAGNOSIS — R7612 Nonspecific reaction to cell mediated immunity measurement of gamma interferon antigen response without active tuberculosis: Secondary | ICD-10-CM | POA: Insufficient documentation

## 2023-02-14 DIAGNOSIS — K429 Umbilical hernia without obstruction or gangrene: Secondary | ICD-10-CM | POA: Diagnosis not present

## 2023-02-14 DIAGNOSIS — I6389 Other cerebral infarction: Secondary | ICD-10-CM | POA: Insufficient documentation

## 2023-02-14 DIAGNOSIS — M1712 Unilateral primary osteoarthritis, left knee: Secondary | ICD-10-CM | POA: Diagnosis not present

## 2023-02-14 DIAGNOSIS — I10 Essential (primary) hypertension: Secondary | ICD-10-CM | POA: Insufficient documentation

## 2023-02-14 DIAGNOSIS — C4321 Malignant melanoma of right ear and external auricular canal: Secondary | ICD-10-CM | POA: Insufficient documentation

## 2023-02-14 DIAGNOSIS — Z8709 Personal history of other diseases of the respiratory system: Secondary | ICD-10-CM | POA: Diagnosis not present

## 2023-02-14 DIAGNOSIS — Z13820 Encounter for screening for osteoporosis: Secondary | ICD-10-CM | POA: Diagnosis not present

## 2023-02-14 DIAGNOSIS — Z8719 Personal history of other diseases of the digestive system: Secondary | ICD-10-CM | POA: Diagnosis not present

## 2023-02-14 DIAGNOSIS — M25511 Pain in right shoulder: Secondary | ICD-10-CM | POA: Diagnosis not present

## 2023-02-14 DIAGNOSIS — I712 Thoracic aortic aneurysm, without rupture, unspecified: Secondary | ICD-10-CM | POA: Insufficient documentation

## 2023-02-15 LAB — CBC WITH DIFFERENTIAL/PLATELET
Absolute Lymphocytes: 1183 {cells}/uL (ref 850–3900)
Absolute Monocytes: 586 {cells}/uL (ref 200–950)
Basophils Absolute: 52 {cells}/uL (ref 0–200)
Basophils Relative: 0.9 %
Eosinophils Absolute: 52 {cells}/uL (ref 15–500)
Eosinophils Relative: 0.9 %
HCT: 38.1 % — ABNORMAL LOW (ref 38.5–50.0)
Hemoglobin: 12.8 g/dL — ABNORMAL LOW (ref 13.2–17.1)
MCH: 30.8 pg (ref 27.0–33.0)
MCHC: 33.6 g/dL (ref 32.0–36.0)
MCV: 91.6 fL (ref 80.0–100.0)
MPV: 10.8 fL (ref 7.5–12.5)
Monocytes Relative: 10.1 %
Neutro Abs: 3927 {cells}/uL (ref 1500–7800)
Neutrophils Relative %: 67.7 %
Platelets: 219 10*3/uL (ref 140–400)
RBC: 4.16 10*6/uL — ABNORMAL LOW (ref 4.20–5.80)
RDW: 12.1 % (ref 11.0–15.0)
Total Lymphocyte: 20.4 %
WBC: 5.8 10*3/uL (ref 3.8–10.8)

## 2023-02-15 LAB — COMPLETE METABOLIC PANEL WITH GFR
AG Ratio: 2.4 (calc) (ref 1.0–2.5)
ALT: 13 U/L (ref 9–46)
AST: 18 U/L (ref 10–35)
Albumin: 4.4 g/dL (ref 3.6–5.1)
Alkaline phosphatase (APISO): 35 U/L (ref 35–144)
BUN: 24 mg/dL (ref 7–25)
CO2: 28 mmol/L (ref 20–32)
Calcium: 9.9 mg/dL (ref 8.6–10.3)
Chloride: 107 mmol/L (ref 98–110)
Creat: 0.99 mg/dL (ref 0.70–1.22)
Globulin: 1.8 g/dL — ABNORMAL LOW (ref 1.9–3.7)
Glucose, Bld: 157 mg/dL — ABNORMAL HIGH (ref 65–99)
Potassium: 4.5 mmol/L (ref 3.5–5.3)
Sodium: 142 mmol/L (ref 135–146)
Total Bilirubin: 0.7 mg/dL (ref 0.2–1.2)
Total Protein: 6.2 g/dL (ref 6.1–8.1)
eGFR: 74 mL/min/{1.73_m2} (ref >=60)

## 2023-02-15 LAB — SEDIMENTATION RATE: Sed Rate: 2 mm/h (ref 0–20)

## 2023-02-15 LAB — PROTEIN / CREATININE RATIO, URINE
Creatinine, Urine: 110 mg/dL (ref 20–320)
Protein/Creat Ratio: 55 mg/g{creat} (ref 25–148)
Protein/Creatinine Ratio: 0.055 mg/mg{creat} (ref 0.025–0.148)
Total Protein, Urine: 6 mg/dL (ref 5–25)

## 2023-02-15 LAB — C3 AND C4
C3 Complement: 118 mg/dL
C4 Complement: 16 mg/dL

## 2023-02-15 LAB — ANTI-DNA ANTIBODY, DOUBLE-STRANDED: ds DNA Ab: 1 [IU]/mL

## 2023-02-16 NOTE — Progress Notes (Signed)
Hemoglobin is low and stable.  Glucose is elevated.  Urine protein creatinine ratio was normal.  Double-stranded ENA negative, complements normal, sed rate normal.

## 2023-03-12 ENCOUNTER — Other Ambulatory Visit: Payer: Self-pay | Admitting: Physician Assistant

## 2023-03-13 NOTE — Telephone Encounter (Signed)
 Last Fill: 12/04/2022  Eye exam: 08/07/2022 WNL   Labs: 02/14/2023 Hemoglobin is low and stable.  Glucose is elevated.  Urine protein creatinine ratio was normal.  Double-stranded ENA negative, complements normal, sed rate normal.   Next Visit: 08/02/2023  Last Visit: 10/23/2022  DX:Other systemic lupus erythematosus with other organ involvement   Current Dose per office note 02/14/2023: Plaquenil  200 mg twice daily Monday to Friday.   Okay to refill Plaquenil ?

## 2023-05-17 DIAGNOSIS — Z789 Other specified health status: Secondary | ICD-10-CM | POA: Diagnosis not present

## 2023-05-17 DIAGNOSIS — L820 Inflamed seborrheic keratosis: Secondary | ICD-10-CM | POA: Diagnosis not present

## 2023-05-17 DIAGNOSIS — R208 Other disturbances of skin sensation: Secondary | ICD-10-CM | POA: Diagnosis not present

## 2023-05-17 DIAGNOSIS — L538 Other specified erythematous conditions: Secondary | ICD-10-CM | POA: Diagnosis not present

## 2023-05-17 DIAGNOSIS — L570 Actinic keratosis: Secondary | ICD-10-CM | POA: Diagnosis not present

## 2023-05-22 ENCOUNTER — Encounter: Payer: Self-pay | Admitting: Family Medicine

## 2023-05-22 DIAGNOSIS — R1011 Right upper quadrant pain: Secondary | ICD-10-CM | POA: Diagnosis not present

## 2023-05-23 ENCOUNTER — Other Ambulatory Visit: Payer: Self-pay | Admitting: Family Medicine

## 2023-05-23 ENCOUNTER — Ambulatory Visit
Admission: RE | Admit: 2023-05-23 | Discharge: 2023-05-23 | Disposition: A | Discharge status: Home / Self Care | Source: Ambulatory Visit | Attending: Family Medicine | Admitting: Family Medicine

## 2023-05-23 DIAGNOSIS — R1011 Right upper quadrant pain: Secondary | ICD-10-CM

## 2023-05-23 DIAGNOSIS — K824 Cholesterolosis of gallbladder: Secondary | ICD-10-CM | POA: Diagnosis not present

## 2023-05-26 ENCOUNTER — Other Ambulatory Visit: Payer: Self-pay | Admitting: Family Medicine

## 2023-05-26 DIAGNOSIS — R1011 Right upper quadrant pain: Secondary | ICD-10-CM

## 2023-06-15 ENCOUNTER — Other Ambulatory Visit: Payer: Self-pay | Admitting: Physician Assistant

## 2023-06-18 NOTE — Telephone Encounter (Signed)
 Last Fill: 03/13/2023  Eye exam: 08/07/2022 WNL   Labs: 02/14/2023  Hemoglobin is low and stable.  Glucose is elevated.  Urine protein creatinine ratio was normal.  Double-stranded ENA negative, complements normal, sed rate normal.   Next Visit: 08/02/2023  Last Visit: 02/14/2023  DX:Other systemic lupus erythematosus with other organ involvement   Current Dose per office note 02/14/2023: Plaquenil  200 mg twice daily Monday to Friday.   Okay to refill Plaquenil ?

## 2023-07-03 DIAGNOSIS — R809 Proteinuria, unspecified: Secondary | ICD-10-CM | POA: Diagnosis not present

## 2023-07-03 DIAGNOSIS — D631 Anemia in chronic kidney disease: Secondary | ICD-10-CM | POA: Diagnosis not present

## 2023-07-03 DIAGNOSIS — E119 Type 2 diabetes mellitus without complications: Secondary | ICD-10-CM | POA: Diagnosis not present

## 2023-07-03 DIAGNOSIS — E1122 Type 2 diabetes mellitus with diabetic chronic kidney disease: Secondary | ICD-10-CM | POA: Diagnosis not present

## 2023-07-03 DIAGNOSIS — N189 Chronic kidney disease, unspecified: Secondary | ICD-10-CM | POA: Diagnosis not present

## 2023-07-03 DIAGNOSIS — M329 Systemic lupus erythematosus, unspecified: Secondary | ICD-10-CM | POA: Diagnosis not present

## 2023-07-03 DIAGNOSIS — N182 Chronic kidney disease, stage 2 (mild): Secondary | ICD-10-CM | POA: Diagnosis not present

## 2023-07-03 DIAGNOSIS — I129 Hypertensive chronic kidney disease with stage 1 through stage 4 chronic kidney disease, or unspecified chronic kidney disease: Secondary | ICD-10-CM | POA: Diagnosis not present

## 2023-07-04 DIAGNOSIS — L821 Other seborrheic keratosis: Secondary | ICD-10-CM | POA: Diagnosis not present

## 2023-07-04 DIAGNOSIS — Z85820 Personal history of malignant melanoma of skin: Secondary | ICD-10-CM | POA: Diagnosis not present

## 2023-07-04 DIAGNOSIS — D485 Neoplasm of uncertain behavior of skin: Secondary | ICD-10-CM | POA: Diagnosis not present

## 2023-07-04 DIAGNOSIS — D225 Melanocytic nevi of trunk: Secondary | ICD-10-CM | POA: Diagnosis not present

## 2023-07-04 DIAGNOSIS — L570 Actinic keratosis: Secondary | ICD-10-CM | POA: Diagnosis not present

## 2023-07-04 DIAGNOSIS — Z86006 Personal history of melanoma in-situ: Secondary | ICD-10-CM | POA: Diagnosis not present

## 2023-07-04 DIAGNOSIS — D0462 Carcinoma in situ of skin of left upper limb, including shoulder: Secondary | ICD-10-CM | POA: Diagnosis not present

## 2023-07-04 DIAGNOSIS — Z85828 Personal history of other malignant neoplasm of skin: Secondary | ICD-10-CM | POA: Diagnosis not present

## 2023-07-04 DIAGNOSIS — Z08 Encounter for follow-up examination after completed treatment for malignant neoplasm: Secondary | ICD-10-CM | POA: Diagnosis not present

## 2023-07-04 DIAGNOSIS — L814 Other melanin hyperpigmentation: Secondary | ICD-10-CM | POA: Diagnosis not present

## 2023-07-08 ENCOUNTER — Other Ambulatory Visit

## 2023-07-13 ENCOUNTER — Ambulatory Visit
Admission: RE | Admit: 2023-07-13 | Discharge: 2023-07-13 | Disposition: A | Discharge status: Home / Self Care | Source: Ambulatory Visit | Attending: Family Medicine | Admitting: Family Medicine

## 2023-07-13 DIAGNOSIS — R1011 Right upper quadrant pain: Secondary | ICD-10-CM | POA: Diagnosis not present

## 2023-07-13 DIAGNOSIS — K828 Other specified diseases of gallbladder: Secondary | ICD-10-CM | POA: Diagnosis not present

## 2023-07-13 MED ORDER — GADOPICLENOL 0.5 MMOL/ML IV SOLN
9.0000 mL | Freq: Once | INTRAVENOUS | Status: AC | PRN
  Administered 2023-07-13: 9 mL via INTRAVENOUS

## 2023-07-19 NOTE — Progress Notes (Signed)
 Office Visit Note  Patient: Tyler Norris             Date of Birth: [birth year]           MRN: [id number]             PCP: Ruven Coy, MD Referring: Ruven Coy, MD Visit Date: 08/02/2023 Occupation: @GUAROCC @  Subjective:  Left ankle pain  History of Present Illness: Tyler Norris is a 89 y.o. male with systemic lupus erythematosus and osteoarthritis.  He returns today after his last visit in December 2024.  He states he has been having pain and discomfort in his shoulders for the last several months.  He has had 2 cortisone injections to his right shoulder joint.  He states he has been going to the gym and has been lifting weights.  He was having some discomfort in his left shoulder joint which is better now.  He continues to have some discomfort in his left knee off-and-on.  Recently has been experiencing discomfort in his left ankle.  He states he notices discomfort while he is putting on shoes.  He gives history of dry eyes, joint pain, photosensitivity.    Activities of Daily Living:  Patient reports morning stiffness for  none.   Patient Denies nocturnal pain.  Difficulty dressing/grooming: Denies Difficulty climbing stairs: Denies Difficulty getting out of chair: Denies Difficulty using hands for taps, buttons, cutlery, and/or writing: Denies  Review of Systems  Constitutional:  Positive for fatigue.  HENT:  Negative for mouth sores and mouth dryness.   Eyes:  Positive for dryness.  Respiratory:  Negative for shortness of breath.   Cardiovascular:  Negative for chest pain and palpitations.  Gastrointestinal:  Negative for blood in stool, constipation and diarrhea.  Endocrine: Positive for increased urination.  Genitourinary:  Positive for involuntary urination.  Musculoskeletal:  Positive for joint pain, joint pain, myalgias, muscle weakness and myalgias. Negative for gait problem, joint swelling, morning stiffness and muscle tenderness.  Skin:  Positive  for rash. Negative for color change, hair loss and sensitivity to sunlight.  Allergic/Immunologic: Negative for susceptible to infections.  Neurological:  Negative for dizziness and headaches.  Hematological:  Negative for swollen glands.  Psychiatric/Behavioral:  Negative for depressed mood and sleep disturbance. The patient is not nervous/anxious.     PMFS History:  Patient Active Problem List   Diagnosis Date Noted   Primary osteoarthritis, right shoulder 08/24/2022   Osteoarthritis of left knee 04/28/2022   History of stroke 09/01/2020   Aortic atherosclerosis (HCC) 09/01/2020   Pain in joint of right ankle 03/27/2017   SDH (subdural hematoma) (HCC) 01/08/2017   Dysphonia 11/15/2015   Aneurysm of thoracic aorta (HCC) 06/02/2015   Lupus (systemic lupus erythematosus) (HCC)    Positive QuantiFERON-TB Gold test 07/05/2012   Hypertension associated with diabetes (HCC) 05/13/2012   OSA on CPAP 05/13/2012   AAA (abdominal aortic aneurysm) without rupture (HCC) 06/27/2011    Past Medical History:  Diagnosis Date   AAA (abdominal aortic aneurysm) (HCC)    a. resolved by ct scan 09-2011   Anemia    Arthritis    Barrett's esophagus    Carpal tunnel syndrome, bilateral    Essential hypertension    Fibromyalgia    GERD (gastroesophageal reflux disease)    History of pneumonia    Hyperlipidemia    Lupus (systemic lupus erythematosus) (HCC)    Nephrolithiasis    Pericardial effusion    a. 08/22/2014 Echo: EF 60-65%, mildly  dil Ao root (40mm), mildly dil LA, mod-large partially organized pericardial effusion with possible early tamponade - asymptomatic on high dose nsaids-->conservative mgmt;  b. 08/2014 Limited Echo: EF 55-60%, Triv - small effusion.   Sleep apnea    a. uses cpap every night    Family History  Problem Relation Age of Onset   Diabetes Mother    Diabetes Sister    Stroke Sister    Healthy Daughter    Healthy Son    Past Surgical History:  Procedure Laterality  Date   APPENDECTOMY     CARPAL TUNNEL RELEASE  10/12/2011   Procedure: CARPAL TUNNEL RELEASE;  Surgeon: Amelie Baize., MD;  Location:  [facility];  Service: Orthopedics;  Laterality: Left;   CARPAL TUNNEL RELEASE  12/01/2011   Procedure: CARPAL TUNNEL RELEASE;  Surgeon: Amelie Baize., MD;  Location:  SURGERY CENTER;  Service: Orthopedics;  Laterality: Right;   CYSTOSCOPY W/ STONE MANIPULATION  2001,2009   HAND SURGERY     scar repaired lt hand   HERNIA REPAIR  1966   IR ANGIO INTRA EXTRACRAN SEL INTERNAL CAROTID BILAT MOD SED  07/09/2020   IR ANGIO VERTEBRAL SEL SUBCLAVIAN INNOMINATE UNI R MOD SED  07/09/2020   IR ANGIO VERTEBRAL SEL VERTEBRAL UNI L MOD SED  07/09/2020   IR US  GUIDE VASC ACCESS RIGHT  07/09/2020   KNEE ARTHROPLASTY     KNEE ARTHROSCOPY Left    MELANOMA EXCISION Right 2024   right ear   TONSILLECTOMY     Social History   Social History Narrative   Not on file   Immunization History  Administered Date(s) Administered   Fluad Quad(high Dose 65+) 01/16/2019, 10/28/2019   Influenza Nasal 11/28/2011   Influenza Split 11/27/2013, 11/28/2014   Influenza,inj,Quad PF,6+ Mos 11/11/2012, 10/29/2015   Influenza-Unspecified 11/16/2014   Moderna Sars-Covid-2 Vaccination 03/12/2019, 04/08/2019, 10/27/2019, 06/21/2020   Pneumococcal Polysaccharide-23 05/15/2012   Tdap 01/07/2017   Zoster Recombinant(Shingrix) 01/16/2018, 03/17/2018     Objective: Vital Signs: BP 137/74 (BP Location: Left Arm, Patient Position: Sitting, Cuff Size: Normal)   Pulse (!) 53   Resp 16   Ht 5\' 9"  (1.753 m)   Wt 198 lb 12.8 oz (90.2 kg)   BMI 29.36 kg/m    Physical Exam Vitals and nursing note reviewed.  Constitutional:      Appearance: He is well-developed.  HENT:     Head: Normocephalic and atraumatic.  Eyes:     Conjunctiva/sclera: Conjunctivae normal.     Pupils: Pupils are equal, round, and reactive to light.  Cardiovascular:     Rate and  Rhythm: Normal rate and regular rhythm.     Heart sounds: Normal heart sounds.  Pulmonary:     Effort: Pulmonary effort is normal.     Breath sounds: Normal breath sounds.  Abdominal:     General: Bowel sounds are normal.     Palpations: Abdomen is soft.  Musculoskeletal:     Cervical back: Normal range of motion and neck supple.  Skin:    General: Skin is warm and dry.     Capillary Refill: Capillary refill takes less than 2 seconds.  Neurological:     Mental Status: He is alert and oriented to person, place, and time.  Psychiatric:        Behavior: Behavior normal.      Musculoskeletal Exam: He had limited left rotation of the cervical spine without discomfort.  Thoracic kyphosis was noted without any  discomfort.  Shoulder joint abduction limited to about 140 degrees with limited internal rotation.  Elbow joints, wrist joints, MCPs PIPs and DIPs with good range of motion.  Bilateral PIP DIP thickening was noted.  Hip joints and knee joints in good range of motion.  He had some warmth on palpation of his left knee joint.  Bilateral pedal edema was noted.  He had no tenderness over ankles.  Bilateral pes cavus was noted.  Right second hammertoe and right great toe hallux valgus deformity was noted.  CDAI Exam: CDAI Score: -- Patient Global: --; Provider Global: -- Swollen: --; Tender: -- Joint Exam 08/02/2023   No joint exam has been documented for this visit   There is currently no information documented on the homunculus. Go to the Rheumatology activity and complete the homunculus joint exam.  Investigation: No additional findings.  Imaging: MR ABDOMEN MRCP W WO CONTAST Result Date: 07/13/2023 CLINICAL DATA:  Right upper quadrant abdominal pain for 2 months. Ultrasound 05/23/2023 showed a small gallbladder polyp EXAM: MRI ABDOMEN WITHOUT AND WITH CONTRAST (INCLUDING MRCP) TECHNIQUE: Multiplanar multisequence MR imaging of the abdomen was performed both before and after the  administration of intravenous contrast. Heavily T2-weighted images of the biliary and pancreatic ducts were obtained, and three-dimensional MRCP images were rendered by post processing. CONTRAST:  9 cc Vueway  COMPARISON:  Ultrasound 05/23/2023 FINDINGS: Lower chest: Chronic peripheral atelectasis or pleural thickening along the posterior basal segment left lower lobe, not substantially changed from 10/16/2018. Mild cardiomegaly. Hepatobiliary: No biliary dilatation or irregularity of the visualized bile ducts. The small gallbladder polyp seen at ultrasound is not well appreciated on MRI. No overt gallbladder mass. 8 by 4 mm cyst posteriorly in the right hepatic lobe. 6 mm flash filling hemangioma segment 3 of the liver demonstrating early and delayed enhancement as on image 43 series 23. Pancreas:  Unremarkable Spleen:  Unremarkable Adrenals/Urinary Tract: Adrenal glands appear normal. Bosniak category 1 left renal cysts or no further imaging workup. Stomach/Bowel: Unremarkable Vascular/Lymphatic: Narrowing/stenosis of the proximal superior mesenteric artery on image 54 series 16, presumably due to atheromatous plaque. No findings of overt occlusion. Other:  No supplemental non-categorized findings. Musculoskeletal: Thoracic and lumbar spondylosis with degenerative loss of intervertebral disc height at L5-S1. IMPRESSION: 1. The small gallbladder polyp seen at ultrasound is not well appreciated on MRI. No overt gallbladder mass. 2. No biliary dilatation or irregularity of the visualized bile ducts. 3. Narrowing/stenosis of the proximal superior mesenteric artery, presumably due to atheromatous plaque. No findings of overt occlusion. 4. Mild cardiomegaly. 5. Chronic peripheral atelectasis or pleural thickening along the posterior basal segment left lower lobe, not substantially changed from 10/16/2018. 6. Thoracic and lumbar spondylosis with degenerative loss of intervertebral disc height at L5-S1. Electronically  Signed   By: Freida Jes M.D.   On: 07/13/2023 11:35    Recent Labs: Lab Results  Component Value Date   WBC 5.8 02/14/2023   HGB 12.8 (L) 02/14/2023   PLT 219 02/14/2023   NA 142 02/14/2023   K 4.5 02/14/2023   CL 107 02/14/2023   CO2 28 02/14/2023   GLUCOSE 157 (H) 02/14/2023   BUN 24 02/14/2023   CREATININE 0.99 02/14/2023   BILITOT 0.7 02/14/2023   ALKPHOS 33 (L) 07/07/2020   AST 18 02/14/2023   ALT 13 02/14/2023   PROT 6.2 02/14/2023   ALBUMIN 4.5 07/07/2020   CALCIUM  9.9 02/14/2023   GFRAA 87 03/04/2020     Speciality Comments: PLQ Eye Exam: 08/07/2022  WNL @ [facility] Follow up in 1 year.  Procedures:  No procedures performed Allergies: Atorvastatin  and Lipitor [atorvastatin  calcium ]   Assessment / Plan:     Visit Diagnoses: Other systemic lupus erythematosus with other organ involvement (HCC) - +ANA, +RF:history of oral ulcers, nasal ulcers, malar rash, photosensitivity or sicca symptoms. -Patient has not had a flare of systemic lupus in a while.  He continues to have some fatigue and dry eyes.  He is not certain how he is taking hydroxychloroquine  currently.  I advised him to call us  back and let us  know once he reaches home. February 14, 2023 CBC and CMP stable, sed rate 2, complements normal, double-stranded DNA negative.  Plan: Protein / creatinine ratio, urine, Anti-DNA antibody, double-stranded, C3 and C4, Sedimentation rate, ANA, CBC with Differential/Platelet, Comprehensive metabolic panel with GFR today.  High risk medication use - Plaquenil  200 mg twice daily Monday to Friday. PLQ Eye Exam: 08/07/2022.  He is scheduled to have eye examination this year.  Chronic pain of both shoulders - He was seen by [facility] and had right cortisone injection by Dr. Shauna Del on August 24, 2022.  Patient states he had another injection in the right shoulder.  He was also having discomfort in the left shoulder joint which eventually resolved by  itself.  Primary osteoarthritis of left knee -he continues to have intermittent discomfort in his left knee joint.  He had some warmth on palpation of his left knee joint.  I offered cortisone injection if his symptoms get worse.  X-ray of left knee in 06/07/2021 revealed severe osteoarthritis and severe chondromalacia patella. s/p orthovisc left knee 05/2022.  Hallux valgus (acquired), right foot-patient is not interested in surgery.  Toe separator was advised.  Hammertoe of second toe of right foot-I offered referral to podiatrist for orthotics but he would prefer to wait at this point.  Pes planus of both feet-use of arch support in bilateral feet was advised.  He has severe bilateral pes planus.  Pedal edema-bilateral lower extremity edema which may be contributing to discomfort.  Other proteinuria-urine creatinine ratio has been normal.  Other medical problems listed as follows:  History of pleural effusion - no recurrence  History of pulmonary embolism  Thoracic aortic aneurysm without rupture, unspecified part (HCC)  Cerebrovascular accident (CVA) due to other mechanism [facility]) - in May 2022.  He lost to left eye peripheral vision and did not recover.  He also notices brain fog.  Malignant melanoma of right ear [facility]) - Diagnosed in December 2022  History of gastroesophageal reflux (GERD)  Essential hypertension  Positive QuantiFERON-TB Gold test - 2019.  OSA on CPAP - he is followed by Dr. Villa Greaser.  Orders: Orders Placed This Encounter  Procedures   Protein / creatinine ratio, urine   Anti-DNA antibody, double-stranded   C3 and C4   Sedimentation rate   ANA   CBC with Differential/Platelet   Comprehensive metabolic panel with GFR   No orders of the defined types were placed in this encounter.    Follow-Up Instructions: Return in about 5 months (around 01/02/2024) for Systemic lupus, Osteoarthritis.   Nicholas Bari, MD  Note - This record has been created using  Animal nutritionist.  Chart creation errors have been sought, but may not always  have been located. Such creation errors do not reflect on  the standard of medical care.

## 2023-07-24 DIAGNOSIS — R1010 Upper abdominal pain, unspecified: Secondary | ICD-10-CM | POA: Diagnosis not present

## 2023-07-24 DIAGNOSIS — Z6829 Body mass index (BMI) 29.0-29.9, adult: Secondary | ICD-10-CM | POA: Diagnosis not present

## 2023-07-24 DIAGNOSIS — M25511 Pain in right shoulder: Secondary | ICD-10-CM | POA: Diagnosis not present

## 2023-07-24 DIAGNOSIS — G8929 Other chronic pain: Secondary | ICD-10-CM | POA: Diagnosis not present

## 2023-07-24 DIAGNOSIS — M25512 Pain in left shoulder: Secondary | ICD-10-CM | POA: Diagnosis not present

## 2023-07-30 DIAGNOSIS — D0462 Carcinoma in situ of skin of left upper limb, including shoulder: Secondary | ICD-10-CM | POA: Diagnosis not present

## 2023-08-02 ENCOUNTER — Encounter: Payer: Self-pay | Admitting: Rheumatology

## 2023-08-02 ENCOUNTER — Ambulatory Visit: Payer: Medicare Other | Attending: Rheumatology | Admitting: Rheumatology

## 2023-08-02 VITALS — BP 137/74 | HR 53 | Resp 16 | Ht 69.0 in | Wt 198.8 lb

## 2023-08-02 DIAGNOSIS — M3219 Other organ or system involvement in systemic lupus erythematosus: Secondary | ICD-10-CM | POA: Diagnosis not present

## 2023-08-02 DIAGNOSIS — R808 Other proteinuria: Secondary | ICD-10-CM | POA: Diagnosis not present

## 2023-08-02 DIAGNOSIS — M2141 Flat foot [pes planus] (acquired), right foot: Secondary | ICD-10-CM | POA: Diagnosis not present

## 2023-08-02 DIAGNOSIS — R600 Localized edema: Secondary | ICD-10-CM | POA: Diagnosis not present

## 2023-08-02 DIAGNOSIS — G8929 Other chronic pain: Secondary | ICD-10-CM | POA: Diagnosis not present

## 2023-08-02 DIAGNOSIS — M1712 Unilateral primary osteoarthritis, left knee: Secondary | ICD-10-CM | POA: Diagnosis not present

## 2023-08-02 DIAGNOSIS — M25512 Pain in left shoulder: Secondary | ICD-10-CM | POA: Diagnosis not present

## 2023-08-02 DIAGNOSIS — Z8719 Personal history of other diseases of the digestive system: Secondary | ICD-10-CM | POA: Insufficient documentation

## 2023-08-02 DIAGNOSIS — C4321 Malignant melanoma of right ear and external auricular canal: Secondary | ICD-10-CM | POA: Diagnosis not present

## 2023-08-02 DIAGNOSIS — I6389 Other cerebral infarction: Secondary | ICD-10-CM | POA: Insufficient documentation

## 2023-08-02 DIAGNOSIS — R7612 Nonspecific reaction to cell mediated immunity measurement of gamma interferon antigen response without active tuberculosis: Secondary | ICD-10-CM | POA: Diagnosis not present

## 2023-08-02 DIAGNOSIS — Z79899 Other long term (current) drug therapy: Secondary | ICD-10-CM | POA: Insufficient documentation

## 2023-08-02 DIAGNOSIS — I10 Essential (primary) hypertension: Secondary | ICD-10-CM | POA: Diagnosis not present

## 2023-08-02 DIAGNOSIS — I712 Thoracic aortic aneurysm, without rupture, unspecified: Secondary | ICD-10-CM | POA: Insufficient documentation

## 2023-08-02 DIAGNOSIS — M2142 Flat foot [pes planus] (acquired), left foot: Secondary | ICD-10-CM | POA: Insufficient documentation

## 2023-08-02 DIAGNOSIS — M25511 Pain in right shoulder: Secondary | ICD-10-CM | POA: Insufficient documentation

## 2023-08-02 DIAGNOSIS — M2011 Hallux valgus (acquired), right foot: Secondary | ICD-10-CM | POA: Insufficient documentation

## 2023-08-02 DIAGNOSIS — G4733 Obstructive sleep apnea (adult) (pediatric): Secondary | ICD-10-CM | POA: Diagnosis not present

## 2023-08-02 DIAGNOSIS — Z8709 Personal history of other diseases of the respiratory system: Secondary | ICD-10-CM | POA: Diagnosis not present

## 2023-08-02 DIAGNOSIS — Z86711 Personal history of pulmonary embolism: Secondary | ICD-10-CM | POA: Insufficient documentation

## 2023-08-02 DIAGNOSIS — M2041 Other hammer toe(s) (acquired), right foot: Secondary | ICD-10-CM | POA: Diagnosis not present

## 2023-08-02 NOTE — Patient Instructions (Signed)
 Vaccines You are taking a medication(s) that can suppress your immune system.  The following immunizations are recommended: Flu annually Covid-19  Td/Tdap (tetanus, diphtheria, pertussis) every 10 years Pneumonia (Prevnar 15 then Pneumovax 23 at least 1 year apart.  Alternatively, can take Prevnar 20 without needing additional dose) Shingrix: 2 doses from 4 weeks to 6 months apart  Please check with your PCP to make sure you are up to date.

## 2023-08-03 ENCOUNTER — Ambulatory Visit: Payer: Self-pay | Admitting: Rheumatology

## 2023-08-03 NOTE — Progress Notes (Signed)
 Hemoglobin is low and stable.  Glucose is elevated at 169.  Urine protein creatinine ratio normal, sed rate normal.  Complements, ANA and dsDNA are pending.

## 2023-08-04 NOTE — Progress Notes (Signed)
 Complements normal, double-stranded DNA negative, ANA pending

## 2023-08-05 LAB — PROTEIN / CREATININE RATIO, URINE
Creatinine, Urine: 96 mg/dL (ref 20–320)
Protein/Creat Ratio: 94 mg/g{creat} (ref 25–148)
Protein/Creatinine Ratio: 0.094 mg/mg{creat} (ref 0.025–0.148)
Total Protein, Urine: 9 mg/dL (ref 5–25)

## 2023-08-05 LAB — C3 AND C4
C3 Complement: 130 mg/dL
C4 Complement: 18 mg/dL

## 2023-08-05 LAB — CBC WITH DIFFERENTIAL/PLATELET
Absolute Lymphocytes: 1109 {cells}/uL (ref 850–3900)
Absolute Monocytes: 521 {cells}/uL (ref 200–950)
Basophils Absolute: 50 {cells}/uL (ref 0–200)
Basophils Relative: 0.9 %
Eosinophils Absolute: 101 {cells}/uL (ref 15–500)
Eosinophils Relative: 1.8 %
HCT: 38.9 % (ref 38.5–50.0)
Hemoglobin: 12.5 g/dL — ABNORMAL LOW (ref 13.2–17.1)
MCH: 30.6 pg (ref 27.0–33.0)
MCHC: 32.1 g/dL (ref 32.0–36.0)
MCV: 95.3 fL (ref 80.0–100.0)
MPV: 10.3 fL (ref 7.5–12.5)
Monocytes Relative: 9.3 %
Neutro Abs: 3819 {cells}/uL (ref 1500–7800)
Neutrophils Relative %: 68.2 %
Platelets: 206 10*3/uL (ref 140–400)
RBC: 4.08 10*6/uL — ABNORMAL LOW (ref 4.20–5.80)
RDW: 11.8 % (ref 11.0–15.0)
Total Lymphocyte: 19.8 %
WBC: 5.6 10*3/uL (ref 3.8–10.8)

## 2023-08-05 LAB — COMPREHENSIVE METABOLIC PANEL WITH GFR
AG Ratio: 2.1 (calc) (ref 1.0–2.5)
ALT: 16 U/L (ref 9–46)
AST: 17 U/L (ref 10–35)
Albumin: 4.5 g/dL (ref 3.6–5.1)
Alkaline phosphatase (APISO): 36 U/L (ref 35–144)
BUN/Creatinine Ratio: 29 (calc) — ABNORMAL HIGH (ref 6–22)
BUN: 32 mg/dL — ABNORMAL HIGH (ref 7–25)
CO2: 27 mmol/L (ref 20–32)
Calcium: 10.1 mg/dL (ref 8.6–10.3)
Chloride: 103 mmol/L (ref 98–110)
Creat: 1.09 mg/dL (ref 0.70–1.22)
Globulin: 2.1 g/dL (ref 1.9–3.7)
Glucose, Bld: 169 mg/dL — ABNORMAL HIGH (ref 65–99)
Potassium: 4.4 mmol/L (ref 3.5–5.3)
Sodium: 140 mmol/L (ref 135–146)
Total Bilirubin: 0.8 mg/dL (ref 0.2–1.2)
Total Protein: 6.6 g/dL (ref 6.1–8.1)
eGFR: 66 mL/min/{1.73_m2} (ref >=60)

## 2023-08-05 LAB — ANTI-NUCLEAR AB-TITER (ANA TITER): ANA Titer 1: 1:160 {titer} — ABNORMAL HIGH

## 2023-08-05 LAB — ANTI-DNA ANTIBODY, DOUBLE-STRANDED: ds DNA Ab: <1 [IU]/mL

## 2023-08-05 LAB — SEDIMENTATION RATE: Sed Rate: 2 mm/h (ref 0–20)

## 2023-08-05 LAB — ANA: Anti Nuclear Antibody (ANA): POSITIVE — AB

## 2023-08-06 NOTE — Progress Notes (Signed)
 ANA is 1: 160 stable, double-stranded DNA is negative

## 2023-08-14 DIAGNOSIS — H25813 Combined forms of age-related cataract, bilateral: Secondary | ICD-10-CM | POA: Diagnosis not present

## 2023-08-14 DIAGNOSIS — Z79899 Other long term (current) drug therapy: Secondary | ICD-10-CM | POA: Diagnosis not present

## 2023-09-05 ENCOUNTER — Other Ambulatory Visit: Payer: Self-pay | Admitting: Physician Assistant

## 2023-09-05 NOTE — Telephone Encounter (Signed)
 Last Fill: 06/18/2023  Eye exam: 08/14/2023 WNL    Labs: 08/02/2023 Hemoglobin is low and stable.  Glucose is elevated at 169.  Urine protein creatinine ratio normal, sed rate normal.   Next Visit: 01/15/2024  Last Visit: 08/02/2023  IK:Nuyzm systemic lupus erythematosus with other organ involvement   Current Dose per office note 08/02/2023: Plaquenil  200 mg twice daily Monday to Friday.   Okay to refill Plaquenil ?

## 2024-01-01 NOTE — Progress Notes (Signed)
 Office Visit Note  Patient: Tyler Norris             Date of Birth: 1935/01/20           MRN: [id number]             PCP: Frederik Charleston, MD Referring: Frederik Charleston, MD Visit Date: 01/15/2024 Occupation: Data Unavailable  Subjective:  Pain in left knee  History of Present Illness: Tyler Norris is a 89 y.o. male with systemic lupus and osteoarthritis.  He returns today after his last visit in June 2025.  He gives history of fatigue and dry eyes.  He denies any history of oral ulcers, nasal ulcers, sicca symptoms, malar rash, photosensitivity, Raynaud's or lymphadenopathy.  He continues to have some discomfort in his shoulders and left knee.  He had good response to left knee joint injection in the past.  He also had injury to his left ankle in the past which hurt some off-and-on.  He is going to the gym 3 times a week on a regular basis and works out.  He developed a rash in the inguinal region for which he was evaluated by his PCP and dermatologist.  He states the results are pending.    Activities of Daily Living:  Patient reports morning stiffness for all day. Patient Denies nocturnal pain.  Difficulty dressing/grooming: Denies Difficulty climbing stairs: Reports Difficulty getting out of chair: Denies Difficulty using hands for taps, buttons, cutlery, and/or writing: Denies  Review of Systems  Constitutional:  Positive for fatigue.  HENT:  Negative for mouth sores and mouth dryness.   Eyes:  Positive for dryness.  Respiratory:  Positive for shortness of breath.        On exertion   Cardiovascular:  Negative for chest pain and palpitations.  Gastrointestinal:  Negative for blood in stool, constipation and diarrhea.  Endocrine: Positive for increased urination.  Genitourinary:  Positive for involuntary urination.  Musculoskeletal:  Positive for joint pain, gait problem, joint pain, joint swelling, muscle weakness and morning stiffness. Negative for myalgias,  muscle tenderness and myalgias.  Skin:  Positive for rash. Negative for color change, hair loss and sensitivity to sunlight.  Allergic/Immunologic: Negative for susceptible to infections.  Neurological:  Positive for dizziness. Negative for headaches.  Hematological:  Negative for swollen glands.  Psychiatric/Behavioral:  Negative for depressed mood and sleep disturbance. The patient is not nervous/anxious.     PMFS History:  Patient Active Problem List   Diagnosis Date Noted   Primary osteoarthritis, right shoulder 08/24/2022   Osteoarthritis of left knee 04/28/2022   History of stroke 09/01/2020   Aortic atherosclerosis 09/01/2020   Pain in joint of right ankle 03/27/2017   SDH (subdural hematoma) (HCC) 01/08/2017   Dysphonia 11/15/2015   Aneurysm of thoracic aorta 06/02/2015   Lupus (systemic lupus erythematosus) (HCC)    Positive QuantiFERON-TB Gold test 07/05/2012   Hypertension associated with diabetes (HCC) 05/13/2012   OSA on CPAP 05/13/2012   AAA (abdominal aortic aneurysm) without rupture 06/27/2011    Past Medical History:  Diagnosis Date   AAA (abdominal aortic aneurysm)    a. resolved by ct scan 09-2011   Anemia    Arthritis    Barrett's esophagus    Carpal tunnel syndrome, bilateral    Essential hypertension    Fibromyalgia    GERD (gastroesophageal reflux disease)    History of pneumonia    Hyperlipidemia    Lupus (systemic lupus erythematosus) (HCC)    Nephrolithiasis  Pericardial effusion    a. 08/22/2014 Echo: EF 60-65%, mildly dil Ao root (40mm), mildly dil LA, mod-large partially organized pericardial effusion with possible early tamponade - asymptomatic on high dose nsaids-->conservative mgmt;  b. 08/2014 Limited Echo: EF 55-60%, Triv - small effusion.   Sleep apnea    a. uses cpap every night    Family History  Problem Relation Age of Onset   Diabetes Mother    Diabetes Sister    Stroke Sister    Healthy Daughter    Healthy Son    Past  Surgical History:  Procedure Laterality Date   APPENDECTOMY     CARPAL TUNNEL RELEASE  10/12/2011   Procedure: CARPAL TUNNEL RELEASE;  Surgeon: Lamar LULLA Leonor Mickey., MD;  Location: [facility];  Service: Orthopedics;  Laterality: Left;   CARPAL TUNNEL RELEASE  12/01/2011   Procedure: CARPAL TUNNEL RELEASE;  Surgeon: Lamar LULLA Leonor Mickey., MD;  Location: [facility];  Service: Orthopedics;  Laterality: Right;   CYSTOSCOPY W/ STONE MANIPULATION  2001,2009   HAND SURGERY     scar repaired lt hand   HERNIA REPAIR  1966   IR ANGIO INTRA EXTRACRAN SEL INTERNAL CAROTID BILAT MOD SED  07/09/2020   IR ANGIO VERTEBRAL SEL SUBCLAVIAN INNOMINATE UNI R MOD SED  07/09/2020   IR ANGIO VERTEBRAL SEL VERTEBRAL UNI L MOD SED  07/09/2020   IR US  GUIDE VASC ACCESS RIGHT  07/09/2020   KNEE ARTHROPLASTY     KNEE ARTHROSCOPY Left    MELANOMA EXCISION Right 2024   right ear   TONSILLECTOMY     Social History   Tobacco Use   Smoking status: Never    Passive exposure: Past   Smokeless tobacco: Never  Vaping Use   Vaping status: Never Used  Substance Use Topics   Alcohol use: Yes    Comment: once every 6 months   Drug use: Never   Social History   Social History Narrative   Not on file     Immunization History  Administered Date(s) Administered   Fluad Quad(high Dose 65+) 01/16/2019, 10/28/2019   Influenza Nasal 11/28/2011   Influenza Split 11/27/2013, 11/28/2014   Influenza,inj,Quad PF,6+ Mos 11/11/2012, 10/29/2015   Influenza-Unspecified 11/16/2014   Moderna Sars-Covid-2 Vaccination 03/12/2019, 04/08/2019, 10/27/2019, 06/21/2020   Pneumococcal Polysaccharide-23 05/15/2012   Tdap 01/07/2017   Zoster Recombinant(Shingrix) 01/16/2018, 03/17/2018     Objective: Vital Signs: BP (!) 162/73   Pulse (!) 49   Temp 97.6 F (36.4 C)   Resp 17   Ht 5' 9 (1.753 m)   Wt 199 lb 6.4 oz (90.4 kg)   BMI 29.45 kg/m    Physical Exam Vitals and nursing note reviewed.   Constitutional:      Appearance: He is well-developed.  HENT:     Norris: Normocephalic and atraumatic.  Eyes:     Conjunctiva/sclera: Conjunctivae normal.     Pupils: Pupils are equal, round, and reactive to light.  Cardiovascular:     Rate and Rhythm: Normal rate and regular rhythm.     Heart sounds: Normal heart sounds.  Pulmonary:     Effort: Pulmonary effort is normal.     Breath sounds: Normal breath sounds.  Abdominal:     General: Bowel sounds are normal.     Palpations: Abdomen is soft.  Musculoskeletal:     Cervical back: Normal range of motion and neck supple.  Skin:    General: Skin is warm and dry.  Capillary Refill: Capillary refill takes less than 2 seconds.  Neurological:     Mental Status: He is alert and oriented to person, place, and time.  Psychiatric:        Behavior: Behavior normal.      Musculoskeletal Exam: He had limited lateral motion of the cervical spine without discomfort.  Thoracic kyphosis without any tenderness was noted.  There was no tenderness over lumbar spine.  Shoulder joint abduction was about 140 degrees bilaterally with limited internal rotation.  Elbow joints and wrist joints in good range of motion.  He had bilateral PIP and DIP thickening.  No synovitis was noted.  Hip joints and knee joints were in good range of motion.  He had some warmth on palpation of his left knee joint and a small effusion.  There was thickening of bilateral ankle joints.  No tenderness noted over MTPs.  CDAI Exam: CDAI Score: -- Patient Global: --; Provider Global: -- Swollen: --; Tender: -- Joint Exam 01/15/2024   No joint exam has been documented for this visit   There is currently no information documented on the homunculus. Go to the Rheumatology activity and complete the homunculus joint exam.  Investigation: No additional findings.  Imaging: No results found.  Recent Labs: Lab Results  Component Value Date   WBC 5.6 08/02/2023   HGB 12.5  (L) 08/02/2023   PLT 206 08/02/2023   NA 140 08/02/2023   K 4.4 08/02/2023   CL 103 08/02/2023   CO2 27 08/02/2023   GLUCOSE 169 (H) 08/02/2023   BUN 32 (H) 08/02/2023   CREATININE 1.09 08/02/2023   BILITOT 0.8 08/02/2023   ALKPHOS 33 (L) 07/07/2020   AST 17 08/02/2023   ALT 16 08/02/2023   PROT 6.6 08/02/2023   ALBUMIN 4.5 07/07/2020   CALCIUM  10.1 08/02/2023   GFRAA 87 03/04/2020    Speciality Comments: PLQ Eye Exam: 08/14/2023 WNL @ [facility] Follow up in 1 year.  Procedures:  No procedures performed Allergies: Atorvastatin  and Lipitor [atorvastatin  calcium ]   Assessment / Plan:     Visit Diagnoses: Other systemic lupus erythematosus with other organ involvement (HCC) - +ANA, +RF:history of oral ulcers, nasal ulcers, malar rash, photosensitivity or sicca symptoms. -He continues to have fatigue and dry eyes.  There is no history of oral ulcers, nasal ulcers, malar rash, photosensitivity, Raynaud's, lymphadenopathy or inflammatory arthritis.  Labs obtained on August 02, 2023 showed mild anemia.  Double-stranded DNA negative, complements normal and sed rate normal.  He continues to be on Plaquenil  200 mg twice a day Monday to Friday.  Plan: Protein / creatinine ratio, urine, Anti-DNA antibody, double-stranded, C3 and C4, Sedimentation rate  High risk medication use - Plaquenil  200 mg twice daily Monday to Friday. PLQ Eye Exam: 08/14/2023 - Plan: CBC with Differential/Platelet, Comprehensive metabolic panel with GFR today.  Information reimmunization was placed in the AVS.  Chronic pain of both shoulders -he continues to have some stiffness in his shoulders.  He had fairly good range of motion with some limitation on abduction and internal rotation.  He had [facility] the [facility]  Primary osteoarthritis of left knee -he has been having some discomfort in his left knee joint.  Mild effusion and warmth was noted.  Patient declined cortisone  injection today.  X-ray of left knee in 06/07/2021 revealed severe osteoarthritis and severe chondromalacia patella. s/p orthovisc left knee 05/2022.  He does not want total knee replacement.  Hallux valgus (  acquired), right foot -chronic pain.  Patient is not interested in surgery.  Toe separator was advised.  Hammertoe of second toe of right foot - offered referral to podiatrist for orthotics but he would prefer to wait at this point.  Pes planus of both feet-proper fitting shoes with arch support were advised.  Other medical problems are listed as follows:  Pedal edema  History of pleural effusion - no recurrence  Thoracic aortic aneurysm without rupture, unspecified part  History of pulmonary embolism  Cerebrovascular accident (CVA) due to other mechanism [facility] - in May 2022.  He lost to left eye peripheral vision and did not recover.  He also notices brain fog.  Malignant melanoma of right ear [facility]) - Diagnosed in December 2022  History of gastroesophageal reflux (GERD)  Essential hypertension-blood pressure was elevated at 170/55.  Positive QuantiFERON-TB Gold test - 2019.  OSA on CPAP - he is followed by Dr. Jude.  Orders: Orders Placed This Encounter  Procedures   Protein / creatinine ratio, urine   CBC with Differential/Platelet   Comprehensive metabolic panel with GFR   Anti-DNA antibody, double-stranded   C3 and C4   Sedimentation rate   No orders of the defined types were placed in this encounter.    Follow-Up Instructions: Return in about 5 months (around 06/14/2024) for Systemic lupus, Osteoarthritis.   Maya Nash, MD  Note - This record has been created using Animal nutritionist.  Chart creation errors have been sought, but may not always  have been located. Such creation errors do not reflect on  the standard of medical care.

## 2024-01-10 DIAGNOSIS — D1801 Hemangioma of skin and subcutaneous tissue: Secondary | ICD-10-CM | POA: Diagnosis not present

## 2024-01-10 DIAGNOSIS — L821 Other seborrheic keratosis: Secondary | ICD-10-CM | POA: Diagnosis not present

## 2024-01-10 DIAGNOSIS — C44622 Squamous cell carcinoma of skin of right upper limb, including shoulder: Secondary | ICD-10-CM | POA: Diagnosis not present

## 2024-01-10 DIAGNOSIS — C44722 Squamous cell carcinoma of skin of right lower limb, including hip: Secondary | ICD-10-CM | POA: Diagnosis not present

## 2024-01-10 DIAGNOSIS — L570 Actinic keratosis: Secondary | ICD-10-CM | POA: Diagnosis not present

## 2024-01-10 DIAGNOSIS — L814 Other melanin hyperpigmentation: Secondary | ICD-10-CM | POA: Diagnosis not present

## 2024-01-10 DIAGNOSIS — Z85820 Personal history of malignant melanoma of skin: Secondary | ICD-10-CM | POA: Diagnosis not present

## 2024-01-10 DIAGNOSIS — Z08 Encounter for follow-up examination after completed treatment for malignant neoplasm: Secondary | ICD-10-CM | POA: Diagnosis not present

## 2024-01-10 DIAGNOSIS — D0422 Carcinoma in situ of skin of left ear and external auricular canal: Secondary | ICD-10-CM | POA: Diagnosis not present

## 2024-01-10 DIAGNOSIS — Z85828 Personal history of other malignant neoplasm of skin: Secondary | ICD-10-CM | POA: Diagnosis not present

## 2024-01-10 DIAGNOSIS — D485 Neoplasm of uncertain behavior of skin: Secondary | ICD-10-CM | POA: Diagnosis not present

## 2024-01-10 DIAGNOSIS — Z86006 Personal history of melanoma in-situ: Secondary | ICD-10-CM | POA: Diagnosis not present

## 2024-01-11 DIAGNOSIS — Z23 Encounter for immunization: Secondary | ICD-10-CM | POA: Diagnosis not present

## 2024-01-11 DIAGNOSIS — D649 Anemia, unspecified: Secondary | ICD-10-CM | POA: Diagnosis not present

## 2024-01-11 DIAGNOSIS — L309 Dermatitis, unspecified: Secondary | ICD-10-CM | POA: Diagnosis not present

## 2024-01-11 DIAGNOSIS — N189 Chronic kidney disease, unspecified: Secondary | ICD-10-CM | POA: Diagnosis not present

## 2024-01-11 DIAGNOSIS — Z6830 Body mass index (BMI) 30.0-30.9, adult: Secondary | ICD-10-CM | POA: Diagnosis not present

## 2024-01-11 DIAGNOSIS — Z1331 Encounter for screening for depression: Secondary | ICD-10-CM | POA: Diagnosis not present

## 2024-01-11 DIAGNOSIS — E782 Mixed hyperlipidemia: Secondary | ICD-10-CM | POA: Diagnosis not present

## 2024-01-11 DIAGNOSIS — Z0000 Encounter for general adult medical examination without abnormal findings: Secondary | ICD-10-CM | POA: Diagnosis not present

## 2024-01-11 DIAGNOSIS — M130 Polyarthritis, unspecified: Secondary | ICD-10-CM | POA: Diagnosis not present

## 2024-01-11 DIAGNOSIS — E119 Type 2 diabetes mellitus without complications: Secondary | ICD-10-CM | POA: Diagnosis not present

## 2024-01-11 DIAGNOSIS — I10 Essential (primary) hypertension: Secondary | ICD-10-CM | POA: Diagnosis not present

## 2024-01-11 DIAGNOSIS — K219 Gastro-esophageal reflux disease without esophagitis: Secondary | ICD-10-CM | POA: Diagnosis not present

## 2024-01-15 ENCOUNTER — Ambulatory Visit: Attending: Rheumatology | Admitting: Rheumatology

## 2024-01-15 ENCOUNTER — Encounter: Payer: Self-pay | Admitting: Rheumatology

## 2024-01-15 VITALS — BP 162/73 | HR 49 | Temp 97.6°F | Resp 17 | Ht 69.0 in | Wt 199.4 lb

## 2024-01-15 DIAGNOSIS — M25512 Pain in left shoulder: Secondary | ICD-10-CM | POA: Diagnosis not present

## 2024-01-15 DIAGNOSIS — I10 Essential (primary) hypertension: Secondary | ICD-10-CM | POA: Insufficient documentation

## 2024-01-15 DIAGNOSIS — C4321 Malignant melanoma of right ear and external auricular canal: Secondary | ICD-10-CM | POA: Insufficient documentation

## 2024-01-15 DIAGNOSIS — I712 Thoracic aortic aneurysm, without rupture, unspecified: Secondary | ICD-10-CM | POA: Insufficient documentation

## 2024-01-15 DIAGNOSIS — G4733 Obstructive sleep apnea (adult) (pediatric): Secondary | ICD-10-CM | POA: Insufficient documentation

## 2024-01-15 DIAGNOSIS — Z79899 Other long term (current) drug therapy: Secondary | ICD-10-CM | POA: Insufficient documentation

## 2024-01-15 DIAGNOSIS — Z86711 Personal history of pulmonary embolism: Secondary | ICD-10-CM | POA: Diagnosis not present

## 2024-01-15 DIAGNOSIS — M2142 Flat foot [pes planus] (acquired), left foot: Secondary | ICD-10-CM | POA: Insufficient documentation

## 2024-01-15 DIAGNOSIS — Z8719 Personal history of other diseases of the digestive system: Secondary | ICD-10-CM | POA: Diagnosis not present

## 2024-01-15 DIAGNOSIS — I6389 Other cerebral infarction: Secondary | ICD-10-CM | POA: Insufficient documentation

## 2024-01-15 DIAGNOSIS — R600 Localized edema: Secondary | ICD-10-CM | POA: Diagnosis not present

## 2024-01-15 DIAGNOSIS — M2011 Hallux valgus (acquired), right foot: Secondary | ICD-10-CM | POA: Diagnosis not present

## 2024-01-15 DIAGNOSIS — R7612 Nonspecific reaction to cell mediated immunity measurement of gamma interferon antigen response without active tuberculosis: Secondary | ICD-10-CM | POA: Diagnosis not present

## 2024-01-15 DIAGNOSIS — M25511 Pain in right shoulder: Secondary | ICD-10-CM | POA: Diagnosis not present

## 2024-01-15 DIAGNOSIS — M2041 Other hammer toe(s) (acquired), right foot: Secondary | ICD-10-CM | POA: Diagnosis not present

## 2024-01-15 DIAGNOSIS — Z8709 Personal history of other diseases of the respiratory system: Secondary | ICD-10-CM | POA: Diagnosis not present

## 2024-01-15 DIAGNOSIS — G8929 Other chronic pain: Secondary | ICD-10-CM | POA: Insufficient documentation

## 2024-01-15 DIAGNOSIS — M2141 Flat foot [pes planus] (acquired), right foot: Secondary | ICD-10-CM | POA: Diagnosis not present

## 2024-01-15 DIAGNOSIS — M3219 Other organ or system involvement in systemic lupus erythematosus: Secondary | ICD-10-CM | POA: Insufficient documentation

## 2024-01-15 DIAGNOSIS — R808 Other proteinuria: Secondary | ICD-10-CM

## 2024-01-15 DIAGNOSIS — M1712 Unilateral primary osteoarthritis, left knee: Secondary | ICD-10-CM | POA: Diagnosis not present

## 2024-01-15 NOTE — Patient Instructions (Signed)
 Vaccines You are taking a medication(s) that can suppress your immune system.  The following immunizations are recommended: Flu annually RSV Covid-19  Td/Tdap (tetanus, diphtheria, pertussis) every 10 years Pneumonia (Prevnar 15 then Pneumovax 23 at least 1 year apart.  Alternatively, can take Prevnar 20 without needing additional dose) Shingrix: 2 doses from 4 weeks to 6 months apart  Please check with your PCP to make sure you are up to date.

## 2024-01-16 DIAGNOSIS — D649 Anemia, unspecified: Secondary | ICD-10-CM | POA: Diagnosis not present

## 2024-01-17 ENCOUNTER — Ambulatory Visit: Payer: Self-pay | Admitting: Rheumatology

## 2024-01-17 NOTE — Progress Notes (Signed)
 Hemoglobin is low and stable.  Glucose is elevated at 144.  Double-stranded ENA negative, sed rate normal, complements normal.  Labs do not indicate an autoimmune disease flare.  Urine protein creatinine ratio pending.

## 2024-01-18 LAB — COMPREHENSIVE METABOLIC PANEL WITH GFR
AG Ratio: 2.6 (calc) — ABNORMAL HIGH (ref 1.0–2.5)
ALT: 15 U/L (ref 9–46)
AST: 20 U/L (ref 10–35)
Albumin: 4.6 g/dL (ref 3.6–5.1)
Alkaline phosphatase (APISO): 31 U/L — ABNORMAL LOW (ref 35–144)
BUN: 21 mg/dL (ref 7–25)
CO2: 26 mmol/L (ref 20–32)
Calcium: 9.8 mg/dL (ref 8.6–10.3)
Chloride: 107 mmol/L (ref 98–110)
Creat: 0.99 mg/dL (ref 0.70–1.22)
Globulin: 1.8 g/dL — ABNORMAL LOW (ref 1.9–3.7)
Glucose, Bld: 144 mg/dL — ABNORMAL HIGH (ref 65–99)
Potassium: 4.6 mmol/L (ref 3.5–5.3)
Sodium: 141 mmol/L (ref 135–146)
Total Bilirubin: 0.7 mg/dL (ref 0.2–1.2)
Total Protein: 6.4 g/dL (ref 6.1–8.1)
eGFR: 74 mL/min/1.73m2 (ref >=60)

## 2024-01-18 LAB — CBC WITH DIFFERENTIAL/PLATELET
Absolute Lymphocytes: 995 {cells}/uL (ref 850–3900)
Absolute Monocytes: 451 {cells}/uL (ref 200–950)
Basophils Absolute: 39 {cells}/uL (ref 0–200)
Basophils Relative: 0.8 %
Eosinophils Absolute: 113 {cells}/uL (ref 15–500)
Eosinophils Relative: 2.3 %
HCT: 36.3 % — ABNORMAL LOW (ref 38.5–50.0)
Hemoglobin: 12.3 g/dL — ABNORMAL LOW (ref 13.2–17.1)
MCH: 31.5 pg (ref 27.0–33.0)
MCHC: 33.9 g/dL (ref 32.0–36.0)
MCV: 93.1 fL (ref 80.0–100.0)
MPV: 10.6 fL (ref 7.5–12.5)
Monocytes Relative: 9.2 %
Neutro Abs: 3303 {cells}/uL (ref 1500–7800)
Neutrophils Relative %: 67.4 %
Platelets: 198 Thousand/uL (ref 140–400)
RBC: 3.9 Million/uL — ABNORMAL LOW (ref 4.20–5.80)
RDW: 12.2 % (ref 11.0–15.0)
Total Lymphocyte: 20.3 %
WBC: 4.9 Thousand/uL (ref 3.8–10.8)

## 2024-01-18 LAB — C3 AND C4
C3 Complement: 118 mg/dL
C4 Complement: 16 mg/dL

## 2024-01-18 LAB — PROTEIN / CREATININE RATIO, URINE
Creatinine, Urine: 86 mg/dL (ref 20–320)
Protein/Creat Ratio: 174 mg/g{creat} — ABNORMAL HIGH (ref 25–148)
Protein/Creatinine Ratio: 0.174 mg/mg{creat} — ABNORMAL HIGH (ref 0.025–0.148)
Total Protein, Urine: 15 mg/dL (ref 5–25)

## 2024-01-18 LAB — ANTI-DNA ANTIBODY, DOUBLE-STRANDED: ds DNA Ab: 1 [IU]/mL

## 2024-01-18 LAB — SEDIMENTATION RATE: Sed Rate: 2 mm/h (ref 0–20)

## 2024-01-18 NOTE — Progress Notes (Signed)
 Urine protein creatinine ratio is mildly elevated.  Hemoglobin remains low and stable.  Glucose is elevated at 144.  Double-stranded DNA negative, complements normal, sed rate normal.  Labs do not indicate an autoimmune disease flare.  Please forward results to his PCP.

## 2024-06-23 ENCOUNTER — Ambulatory Visit: Admitting: Rheumatology
